# Patient Record
Sex: Female | Born: 1935 | Race: White | Hispanic: No | Smoking: Former smoker
Health system: Southern US, Community
[De-identification: ages and names within clinical notes are randomized; demographics above are authoritative.]

## PROBLEM LIST (undated history)

## (undated) DIAGNOSIS — E1351 Other specified diabetes mellitus with diabetic peripheral angiopathy without gangrene: Secondary | ICD-10-CM

## (undated) DIAGNOSIS — F411 Generalized anxiety disorder: Secondary | ICD-10-CM

## (undated) DIAGNOSIS — I10 Essential (primary) hypertension: Secondary | ICD-10-CM

## (undated) DIAGNOSIS — J189 Pneumonia, unspecified organism: Secondary | ICD-10-CM

## (undated) DIAGNOSIS — C50919 Malignant neoplasm of unspecified site of unspecified female breast: Secondary | ICD-10-CM

## (undated) DIAGNOSIS — N183 Chronic kidney disease, stage 3 (moderate): Secondary | ICD-10-CM

## (undated) DIAGNOSIS — K5730 Diverticulosis of large intestine without perforation or abscess without bleeding: Secondary | ICD-10-CM

## (undated) DIAGNOSIS — H00019 Hordeolum externum unspecified eye, unspecified eyelid: Secondary | ICD-10-CM

## (undated) DIAGNOSIS — J449 Chronic obstructive pulmonary disease, unspecified: Secondary | ICD-10-CM

## (undated) DIAGNOSIS — E039 Hypothyroidism, unspecified: Secondary | ICD-10-CM

## (undated) DIAGNOSIS — I700 Atherosclerosis of aorta: Secondary | ICD-10-CM

## (undated) DIAGNOSIS — F339 Major depressive disorder, recurrent, unspecified: Secondary | ICD-10-CM

## (undated) DIAGNOSIS — M4712 Other spondylosis with myelopathy, cervical region: Secondary | ICD-10-CM

## (undated) DIAGNOSIS — E1365 Other specified diabetes mellitus with hyperglycemia: Secondary | ICD-10-CM

## (undated) DIAGNOSIS — K219 Gastro-esophageal reflux disease without esophagitis: Secondary | ICD-10-CM

## (undated) DIAGNOSIS — I5030 Unspecified diastolic (congestive) heart failure: Secondary | ICD-10-CM

## (undated) HISTORY — DX: Other spondylosis with myelopathy, cervical region: M47.12

## (undated) HISTORY — PX: CHOLECYSTECTOMY: SHX55

## (undated) HISTORY — DX: Generalized anxiety disorder: F41.1

## (undated) HISTORY — DX: Gastro-esophageal reflux disease without esophagitis: K21.9

## (undated) HISTORY — DX: Hordeolum externum unspecified eye, unspecified eyelid: H00.019

## (undated) HISTORY — PX: MASTECTOMY: SHX3

## (undated) HISTORY — DX: Other specified diabetes mellitus with diabetic peripheral angiopathy without gangrene: E13.51

## (undated) HISTORY — DX: Other specified diabetes mellitus with hyperglycemia: E13.65

## (undated) HISTORY — DX: Chronic kidney disease, stage 3 (moderate): N18.3

## (undated) HISTORY — PX: BREAST SURGERY: SHX581

## (undated) HISTORY — DX: Major depressive disorder, recurrent, unspecified: F33.9

---

## 2014-01-18 ENCOUNTER — Encounter (HOSPITAL_COMMUNITY): Payer: Self-pay | Admitting: Emergency Medicine

## 2014-01-18 ENCOUNTER — Inpatient Hospital Stay (HOSPITAL_COMMUNITY)
Admission: EM | Admit: 2014-01-18 | Discharge: 2014-01-27 | DRG: 189 | Disposition: A | Discharge status: Home Health | Payer: Medicare Other | Attending: Family Medicine | Admitting: Family Medicine

## 2014-01-18 ENCOUNTER — Emergency Department (HOSPITAL_COMMUNITY): Payer: Medicare Other

## 2014-01-18 DIAGNOSIS — K9189 Other postprocedural complications and disorders of digestive system: Secondary | ICD-10-CM

## 2014-01-18 DIAGNOSIS — I509 Heart failure, unspecified: Secondary | ICD-10-CM | POA: Diagnosis present

## 2014-01-18 DIAGNOSIS — E875 Hyperkalemia: Secondary | ICD-10-CM | POA: Diagnosis not present

## 2014-01-18 DIAGNOSIS — J441 Chronic obstructive pulmonary disease with (acute) exacerbation: Secondary | ICD-10-CM

## 2014-01-18 DIAGNOSIS — K567 Ileus, unspecified: Secondary | ICD-10-CM

## 2014-01-18 DIAGNOSIS — D72829 Elevated white blood cell count, unspecified: Secondary | ICD-10-CM

## 2014-01-18 DIAGNOSIS — Z791 Long term (current) use of non-steroidal anti-inflammatories (NSAID): Secondary | ICD-10-CM

## 2014-01-18 DIAGNOSIS — Z803 Family history of malignant neoplasm of breast: Secondary | ICD-10-CM

## 2014-01-18 DIAGNOSIS — J449 Chronic obstructive pulmonary disease, unspecified: Secondary | ICD-10-CM

## 2014-01-18 DIAGNOSIS — D175 Benign lipomatous neoplasm of intra-abdominal organs: Secondary | ICD-10-CM | POA: Diagnosis present

## 2014-01-18 DIAGNOSIS — I5032 Chronic diastolic (congestive) heart failure: Secondary | ICD-10-CM | POA: Diagnosis present

## 2014-01-18 DIAGNOSIS — Z6841 Body Mass Index (BMI) 40.0 and over, adult: Secondary | ICD-10-CM

## 2014-01-18 DIAGNOSIS — R7309 Other abnormal glucose: Secondary | ICD-10-CM | POA: Diagnosis present

## 2014-01-18 DIAGNOSIS — D649 Anemia, unspecified: Secondary | ICD-10-CM

## 2014-01-18 DIAGNOSIS — Z79899 Other long term (current) drug therapy: Secondary | ICD-10-CM

## 2014-01-18 DIAGNOSIS — F411 Generalized anxiety disorder: Secondary | ICD-10-CM | POA: Diagnosis present

## 2014-01-18 DIAGNOSIS — D509 Iron deficiency anemia, unspecified: Secondary | ICD-10-CM | POA: Diagnosis present

## 2014-01-18 DIAGNOSIS — Z9981 Dependence on supplemental oxygen: Secondary | ICD-10-CM

## 2014-01-18 DIAGNOSIS — Z9010 Acquired absence of unspecified breast and nipple: Secondary | ICD-10-CM

## 2014-01-18 DIAGNOSIS — D62 Acute posthemorrhagic anemia: Secondary | ICD-10-CM | POA: Diagnosis present

## 2014-01-18 DIAGNOSIS — Z853 Personal history of malignant neoplasm of breast: Secondary | ICD-10-CM

## 2014-01-18 DIAGNOSIS — Z87891 Personal history of nicotine dependence: Secondary | ICD-10-CM

## 2014-01-18 DIAGNOSIS — K922 Gastrointestinal hemorrhage, unspecified: Secondary | ICD-10-CM

## 2014-01-18 DIAGNOSIS — K921 Melena: Secondary | ICD-10-CM

## 2014-01-18 DIAGNOSIS — E039 Hypothyroidism, unspecified: Secondary | ICD-10-CM

## 2014-01-18 DIAGNOSIS — K560 Paralytic ileus: Secondary | ICD-10-CM | POA: Diagnosis present

## 2014-01-18 DIAGNOSIS — I10 Essential (primary) hypertension: Secondary | ICD-10-CM

## 2014-01-18 DIAGNOSIS — J9620 Acute and chronic respiratory failure, unspecified whether with hypoxia or hypercapnia: Principal | ICD-10-CM

## 2014-01-18 DIAGNOSIS — Z801 Family history of malignant neoplasm of trachea, bronchus and lung: Secondary | ICD-10-CM

## 2014-01-18 DIAGNOSIS — J9621 Acute and chronic respiratory failure with hypoxia: Secondary | ICD-10-CM | POA: Diagnosis present

## 2014-01-18 HISTORY — DX: Essential (primary) hypertension: I10

## 2014-01-18 HISTORY — DX: Pneumonia, unspecified organism: J18.9

## 2014-01-18 HISTORY — DX: Malignant neoplasm of unspecified site of unspecified female breast: C50.919

## 2014-01-18 HISTORY — DX: Hypothyroidism, unspecified: E03.9

## 2014-01-18 HISTORY — DX: Chronic obstructive pulmonary disease, unspecified: J44.9

## 2014-01-18 LAB — COMPREHENSIVE METABOLIC PANEL
ALK PHOS: 56 U/L (ref 39–117)
ALT: 16 U/L (ref 0–35)
AST: 16 U/L (ref 0–37)
Albumin: 3.5 g/dL (ref 3.5–5.2)
BUN: 25 mg/dL — ABNORMAL HIGH (ref 6–23)
CO2: 26 meq/L (ref 19–32)
Calcium: 9.2 mg/dL (ref 8.4–10.5)
Chloride: 94 mEq/L — ABNORMAL LOW (ref 96–112)
Creatinine, Ser: 0.88 mg/dL (ref 0.50–1.10)
GFR calc Af Amer: 70 mL/min — ABNORMAL LOW (ref >90)
GFR, EST NON AFRICAN AMERICAN: 60 mL/min — AB (ref >90)
Glucose, Bld: 128 mg/dL — ABNORMAL HIGH (ref 70–99)
POTASSIUM: 4.1 meq/L (ref 3.7–5.3)
SODIUM: 134 meq/L — AB (ref 137–147)
Total Bilirubin: 0.3 mg/dL (ref 0.3–1.2)
Total Protein: 6.6 g/dL (ref 6.0–8.3)

## 2014-01-18 LAB — BLOOD GAS, ARTERIAL
ACID-BASE EXCESS: 1.4 mmol/L (ref 0.0–2.0)
Bicarbonate: 25.3 mEq/L — ABNORMAL HIGH (ref 20.0–24.0)
DRAWN BY: 331471
O2 CONTENT: 3 L/min
O2 Saturation: 97.3 %
PCO2 ART: 39.6 mmHg (ref 35.0–45.0)
PH ART: 7.422 (ref 7.350–7.450)
Patient temperature: 98.6
TCO2: 23.9 mmol/L (ref 0–100)
pO2, Arterial: 96.5 mmHg (ref 80.0–100.0)

## 2014-01-18 LAB — CBC WITH DIFFERENTIAL/PLATELET
Basophils Absolute: 0.1 10*3/uL (ref 0.0–0.1)
Basophils Relative: 1 % (ref 0–1)
Eosinophils Absolute: 0.1 10*3/uL (ref 0.0–0.7)
Eosinophils Relative: 1 % (ref 0–5)
HCT: 25 % — ABNORMAL LOW (ref 36.0–46.0)
Hemoglobin: 8.3 g/dL — ABNORMAL LOW (ref 12.0–15.0)
LYMPHS ABS: 1.8 10*3/uL (ref 0.7–4.0)
LYMPHS PCT: 15 % (ref 12–46)
MCH: 31.1 pg (ref 26.0–34.0)
MCHC: 33.2 g/dL (ref 30.0–36.0)
MCV: 93.6 fL (ref 78.0–100.0)
Monocytes Absolute: 0.6 10*3/uL (ref 0.1–1.0)
Monocytes Relative: 5 % (ref 3–12)
NEUTROS ABS: 9.3 10*3/uL — AB (ref 1.7–7.7)
NEUTROS PCT: 78 % — AB (ref 43–77)
PLATELETS: 379 10*3/uL (ref 150–400)
RBC: 2.67 MIL/uL — AB (ref 3.87–5.11)
RDW: 15.4 % (ref 11.5–15.5)
WBC: 11.9 10*3/uL — AB (ref 4.0–10.5)

## 2014-01-18 LAB — CBC
HEMATOCRIT: 29.9 % — AB (ref 36.0–46.0)
Hemoglobin: 9.1 g/dL — ABNORMAL LOW (ref 12.0–15.0)
MCH: 28.6 pg (ref 26.0–34.0)
MCHC: 30.4 g/dL (ref 30.0–36.0)
MCV: 94 fL (ref 78.0–100.0)
PLATELETS: 308 10*3/uL (ref 150–400)
RBC: 3.18 MIL/uL — ABNORMAL LOW (ref 3.87–5.11)
RDW: 15.6 % — AB (ref 11.5–15.5)
WBC: 11.9 10*3/uL — AB (ref 4.0–10.5)

## 2014-01-18 LAB — PROTIME-INR
INR: 0.98 (ref 0.00–1.49)
Prothrombin Time: 12.8 seconds (ref 11.6–15.2)

## 2014-01-18 LAB — I-STAT CG4 LACTIC ACID, ED: Lactic Acid, Venous: 1.66 mmol/L (ref 0.5–2.2)

## 2014-01-18 LAB — URINALYSIS, ROUTINE W REFLEX MICROSCOPIC
BILIRUBIN URINE: NEGATIVE
Glucose, UA: NEGATIVE mg/dL
HGB URINE DIPSTICK: NEGATIVE
Ketones, ur: NEGATIVE mg/dL
Nitrite: NEGATIVE
PH: 6 (ref 5.0–8.0)
Protein, ur: NEGATIVE mg/dL
SPECIFIC GRAVITY, URINE: 1.012 (ref 1.005–1.030)
Urobilinogen, UA: 0.2 mg/dL (ref 0.0–1.0)

## 2014-01-18 LAB — MAGNESIUM: Magnesium: 2.2 mg/dL (ref 1.5–2.5)

## 2014-01-18 LAB — TROPONIN I
Troponin I: <0.3 ng/mL (ref <0.30)
Troponin I: <0.3 ng/mL (ref <0.30)

## 2014-01-18 LAB — URINE MICROSCOPIC-ADD ON

## 2014-01-18 LAB — LIPASE, BLOOD: LIPASE: 32 U/L (ref 11–59)

## 2014-01-18 LAB — PRO B NATRIURETIC PEPTIDE: Pro B Natriuretic peptide (BNP): 484.2 pg/mL — ABNORMAL HIGH (ref 0–450)

## 2014-01-18 LAB — MRSA PCR SCREENING: MRSA BY PCR: NEGATIVE

## 2014-01-18 LAB — ABO/RH: ABO/RH(D): O POS

## 2014-01-18 MED ORDER — PANTOPRAZOLE SODIUM 40 MG IV SOLR: 40.0000 mg | Freq: Two times a day (BID) | INTRAVENOUS | Status: DC

## 2014-01-18 MED ORDER — MOMETASONE FURO-FORMOTEROL FUM 100-5 MCG/ACT IN AERO
2.0000 | INHALATION_SPRAY | Freq: Two times a day (BID) | RESPIRATORY_TRACT | Status: DC
  Administered 2014-01-18 – 2014-01-19 (×2): 2 via RESPIRATORY_TRACT
  Filled 2014-01-18: qty 8.8

## 2014-01-18 MED ORDER — ONDANSETRON HCL 4 MG/2ML IJ SOLN
4.0000 mg | Freq: Four times a day (QID) | INTRAMUSCULAR | Status: DC | PRN
  Administered 2014-01-20: 4 mg via INTRAVENOUS
  Filled 2014-01-18: qty 2

## 2014-01-18 MED ORDER — POLYETHYLENE GLYCOL 3350 17 G PO PACK
17.0000 g | PACK | Freq: Every day | ORAL | Status: DC | PRN
  Filled 2014-01-18: qty 1

## 2014-01-18 MED ORDER — BISACODYL 10 MG RE SUPP: 10.0000 mg | Freq: Every day | RECTAL | Status: DC | PRN

## 2014-01-18 MED ORDER — ONDANSETRON HCL 4 MG PO TABS: 4.0000 mg | ORAL_TABLET | Freq: Four times a day (QID) | ORAL | Status: DC | PRN

## 2014-01-18 MED ORDER — LEVOFLOXACIN IN D5W 500 MG/100ML IV SOLN
500.0000 mg | INTRAVENOUS | Status: DC
  Administered 2014-01-19 – 2014-01-22 (×4): 500 mg via INTRAVENOUS
  Filled 2014-01-18 (×4): qty 100

## 2014-01-18 MED ORDER — ALBUTEROL SULFATE (2.5 MG/3ML) 0.083% IN NEBU: 2.5000 mg | INHALATION_SOLUTION | RESPIRATORY_TRACT | Status: DC | PRN

## 2014-01-18 MED ORDER — SODIUM CHLORIDE 0.9 % IV SOLN
8.0000 mg/h | INTRAVENOUS | Status: DC
  Administered 2014-01-18: 8 mg/h via INTRAVENOUS
  Filled 2014-01-18 (×4): qty 80

## 2014-01-18 MED ORDER — IPRATROPIUM BROMIDE 0.02 % IN SOLN
0.5000 mg | RESPIRATORY_TRACT | Status: DC
Start: 2014-01-18 — End: 2014-01-18

## 2014-01-18 MED ORDER — LEVOFLOXACIN IN D5W 750 MG/150ML IV SOLN
750.0000 mg | Freq: Once | INTRAVENOUS | Status: AC
  Administered 2014-01-18: 750 mg via INTRAVENOUS
  Filled 2014-01-18: qty 150

## 2014-01-18 MED ORDER — LEVOTHYROXINE SODIUM 75 MCG PO TABS
75.0000 ug | ORAL_TABLET | Freq: Every day | ORAL | Status: DC
  Administered 2014-01-19 – 2014-01-27 (×9): 75 ug via ORAL
  Filled 2014-01-18 (×11): qty 1

## 2014-01-18 MED ORDER — ACETAMINOPHEN 325 MG PO TABS
650.0000 mg | ORAL_TABLET | Freq: Four times a day (QID) | ORAL | Status: DC | PRN
  Administered 2014-01-22: 650 mg via ORAL
  Filled 2014-01-18: qty 2

## 2014-01-18 MED ORDER — OSELTAMIVIR PHOSPHATE 75 MG PO CAPS
75.0000 mg | ORAL_CAPSULE | Freq: Two times a day (BID) | ORAL | Status: DC
  Administered 2014-01-18 – 2014-01-19 (×2): 75 mg via ORAL
  Filled 2014-01-18 (×5): qty 1

## 2014-01-18 MED ORDER — DM-GUAIFENESIN ER 30-600 MG PO TB12: 1.0000 | ORAL_TABLET | Freq: Two times a day (BID) | ORAL | Status: DC

## 2014-01-18 MED ORDER — SODIUM CHLORIDE 0.9 % IV SOLN
INTRAVENOUS | Status: DC
  Administered 2014-01-18: 21:00:00 via INTRAVENOUS

## 2014-01-18 MED ORDER — IPRATROPIUM-ALBUTEROL 0.5-2.5 (3) MG/3ML IN SOLN
3.0000 mL | RESPIRATORY_TRACT | Status: DC
  Administered 2014-01-18 – 2014-01-20 (×12): 3 mL via RESPIRATORY_TRACT
  Filled 2014-01-18 (×13): qty 3

## 2014-01-18 MED ORDER — SODIUM CHLORIDE 0.9 % IV SOLN
80.0000 mg | Freq: Once | INTRAVENOUS | Status: DC
  Filled 2014-01-18: qty 80

## 2014-01-18 MED ORDER — ACETAMINOPHEN 650 MG RE SUPP: 650.0000 mg | Freq: Four times a day (QID) | RECTAL | Status: DC | PRN

## 2014-01-18 MED ORDER — ALBUTEROL SULFATE (2.5 MG/3ML) 0.083% IN NEBU
5.0000 mg | INHALATION_SOLUTION | Freq: Once | RESPIRATORY_TRACT | Status: AC
  Administered 2014-01-18: 5 mg via RESPIRATORY_TRACT
  Filled 2014-01-18 (×2): qty 6

## 2014-01-18 MED ORDER — SODIUM CHLORIDE 0.9 % IJ SOLN
3.0000 mL | Freq: Two times a day (BID) | INTRAMUSCULAR | Status: DC
  Administered 2014-01-19 – 2014-01-27 (×10): 3 mL via INTRAVENOUS

## 2014-01-18 MED ORDER — SODIUM CHLORIDE 0.9 % IV BOLUS (SEPSIS): 1000.0000 mL | Freq: Once | INTRAVENOUS | Status: DC

## 2014-01-18 MED ORDER — IPRATROPIUM BROMIDE 0.02 % IN SOLN
0.5000 mg | Freq: Once | RESPIRATORY_TRACT | Status: AC
  Administered 2014-01-18: 0.5 mg via RESPIRATORY_TRACT
  Filled 2014-01-18: qty 2.5

## 2014-01-18 MED ORDER — METHYLPREDNISOLONE SODIUM SUCC 125 MG IJ SOLR: 80.0000 mg | Freq: Once | INTRAMUSCULAR | Status: DC

## 2014-01-18 MED ORDER — IPRATROPIUM BROMIDE 0.02 % IN SOLN: 0.5000 mg | RESPIRATORY_TRACT | Status: DC | PRN

## 2014-01-18 MED ORDER — DM-GUAIFENESIN ER 30-600 MG PO TB12
2.0000 | ORAL_TABLET | Freq: Two times a day (BID) | ORAL | Status: DC
  Administered 2014-01-18 – 2014-01-27 (×17): 2 via ORAL
  Filled 2014-01-18 (×20): qty 2

## 2014-01-18 MED ORDER — FLEET ENEMA 7-19 GM/118ML RE ENEM: 1.0000 | ENEMA | Freq: Once | RECTAL | Status: AC | PRN

## 2014-01-18 MED ORDER — METHYLPREDNISOLONE SODIUM SUCC 125 MG IJ SOLR
80.0000 mg | Freq: Three times a day (TID) | INTRAMUSCULAR | Status: DC
  Administered 2014-01-18 – 2014-01-22 (×11): 80 mg via INTRAVENOUS
  Filled 2014-01-18 (×5): qty 1.28
  Filled 2014-01-18: qty 2
  Filled 2014-01-18 (×10): qty 1.28
  Filled 2014-01-18: qty 2
  Filled 2014-01-18: qty 1.28

## 2014-01-18 MED ORDER — OXYCODONE HCL 5 MG PO TABS
5.0000 mg | ORAL_TABLET | ORAL | Status: DC | PRN
  Administered 2014-01-19 – 2014-01-21 (×3): 5 mg via ORAL
  Filled 2014-01-18 (×3): qty 1

## 2014-01-18 MED ORDER — ALBUTEROL SULFATE (2.5 MG/3ML) 0.083% IN NEBU
2.5000 mg | INHALATION_SOLUTION | RESPIRATORY_TRACT | Status: DC
Start: 2014-01-18 — End: 2014-01-18

## 2014-01-18 NOTE — ED Notes (Signed)
Respiratory at bedside.

## 2014-01-18 NOTE — ED Notes (Signed)
Pt returned from XRAY 

## 2014-01-18 NOTE — ED Provider Notes (Addendum)
CSN: [id number]     Arrival date & time 01/18/14  1455 History   First MD Initiated Contact with Patient 01/18/14 1506     Chief Complaint  Patient presents with  . Shortness of Breath     (Consider location/radiation/quality/duration/timing/severity/associated sxs/prior Treatment) HPI Comments: Patient presents to the ER for evaluation of shortness of breath. Patient reports that she has been ill since Christmas. She reports cough, congestion and cold symptoms. In the last couple of days, however, she has progressively worsened. She has had increased cough and significant shortness of breath.  Patient reports a history of COPD. She usually only needs to use one albuterol treatment today. She does have home oxygen, but only uses it occasionally. She has had increased all of this in the last few days.  Patient also reports that she has been experiencing abdominal distention and discomfort. It has been ongoing for approximately 5 days. It started with nausea, vomiting and diarrhea. She then noticed that her stools became black. This has been clearing up in the last couple of days.  Patient is a 80 y.o. female presenting with shortness of breath.  Shortness of Breath Associated symptoms: abdominal pain, cough and wheezing     Past Medical History  Diagnosis Date  . COPD (chronic obstructive pulmonary disease)   . Breast CA   . Hypertension    Past Surgical History  Procedure Laterality Date  . Cholecystectomy     History reviewed. No pertinent family history. History  Substance Use Topics  . Smoking status: Former Research scientist (life sciences)  . Smokeless tobacco: Not on file  . Alcohol Use: No   OB History   Grav Para Term Preterm Abortions TAB SAB Ect Mult Living                 Review of Systems  Respiratory: Positive for cough, shortness of breath and wheezing.   Gastrointestinal: Positive for abdominal pain and abdominal distention.  All other systems reviewed and are  negative.      Allergies  Review of patient's allergies indicates no known allergies.  Home Medications   Current Outpatient Rx  Name  Route  Sig  Dispense  Refill  . albuterol (PROVENTIL HFA;VENTOLIN HFA) 108 (90 BASE) MCG/ACT inhaler   Inhalation   Inhale 1 puff into the lungs every 6 (six) hours as needed for wheezing or shortness of breath.         Marland Kitchen albuterol (PROVENTIL) (2.5 MG/3ML) 0.083% nebulizer solution   Nebulization   Take 2.5 mg by nebulization every 6 (six) hours as needed for wheezing or shortness of breath.         . cholecalciferol (VITAMIN D) 1000 UNITS tablet   Oral   Take 1,000 Units by mouth daily.         . Fluticasone-Salmeterol (ADVAIR) 250-50 MCG/DOSE AEPB   Inhalation   Inhale 1 puff into the lungs 2 (two) times daily.         . hydrochlorothiazide (HYDRODIURIL) 25 MG tablet   Oral   Take 25 mg by mouth daily.         Marland Kitchen levothyroxine (SYNTHROID, LEVOTHROID) 75 MCG tablet   Oral   Take 75 mcg by mouth daily before breakfast.         . lisinopril (PRINIVIL,ZESTRIL) 10 MG tablet   Oral   Take 10 mg by mouth daily.         Marland Kitchen tiotropium (SPIRIVA) 18 MCG inhalation capsule   Inhalation  Place 18 mcg into inhaler and inhale daily.          BP 121/50  Pulse 75  Temp(Src) 97.9 F (36.6 C) (Oral)  Resp 23  SpO2 100% Physical Exam  Constitutional: She is oriented to person, place, and time. She appears well-developed and well-nourished. She appears distressed.  HENT:  Head: Normocephalic and atraumatic.  Right Ear: Hearing normal.  Left Ear: Hearing normal.  Nose: Nose normal.  Mouth/Throat: Oropharynx is clear and moist and mucous membranes are normal.  Eyes: Conjunctivae and EOM are normal. Pupils are equal, round, and reactive to light.  Neck: Normal range of motion. Neck supple.  Cardiovascular: Regular rhythm, S1 normal and S2 normal.  Exam reveals no gallop and no friction rub.   No murmur heard. Pulmonary/Chest:  Accessory muscle usage present. Tachypnea noted. She is in respiratory distress. She has decreased breath sounds. She has wheezes. She has no rhonchi. She has no rales. She exhibits no tenderness.  Abdominal: Soft. Normal appearance. She exhibits distension. Bowel sounds are decreased. There is no hepatosplenomegaly. There is no tenderness. There is no rebound, no guarding, no tenderness at McBurney's point and negative Murphy's sign. No hernia.  Musculoskeletal: Normal range of motion. She exhibits edema.  Neurological: She is alert and oriented to person, place, and time. She has normal strength. No cranial nerve deficit or sensory deficit. Coordination normal. GCS eye subscore is 4. GCS verbal subscore is 5. GCS motor subscore is 6.  Skin: Skin is warm, dry and intact. No rash noted. No cyanosis.  Psychiatric: She has a normal mood and affect. Her speech is normal and behavior is normal. Thought content normal.    ED Course  Procedures (including critical care time) Labs Review Labs Reviewed  CBC WITH DIFFERENTIAL - Abnormal; Notable for the following:    WBC 11.9 (*)    RBC 2.67 (*)    Hemoglobin 8.3 (*)    HCT 25.0 (*)    Neutrophils Relative % 78 (*)    Neutro Abs 9.3 (*)    All other components within normal limits  COMPREHENSIVE METABOLIC PANEL - Abnormal; Notable for the following:    Sodium 134 (*)    Chloride 94 (*)    Glucose, Bld 128 (*)    BUN 25 (*)    GFR calc non Af Amer 60 (*)    GFR calc Af Amer 70 (*)    All other components within normal limits  PRO B NATRIURETIC PEPTIDE - Abnormal; Notable for the following:    Pro B Natriuretic peptide (BNP) 484.2 (*)    All other components within normal limits  TROPONIN I  PROTIME-INR  LIPASE, BLOOD  URINALYSIS, ROUTINE W REFLEX MICROSCOPIC  I-STAT CG4 LACTIC ACID, ED  POC OCCULT BLOOD, ED  TYPE AND SCREEN  ABO/RH   Imaging Review Dg Abd Acute W/chest  01/18/2014   CLINICAL DATA:  Cough, shortness of breath,  abdominal pain. Abdominal distention.  EXAM: ACUTE ABDOMEN SERIES (ABDOMEN 2 VIEW & CHEST 1 VIEW)  COMPARISON:  None.  FINDINGS: Mild hyperinflation of the lungs. Heart is borderline in size. No confluent airspace opacities or effusions.  Nonobstructive bowel gas pattern. Prior cholecystectomy. Moderate stool in the colon. No free air, organomegaly or suspicious calcification.  IMPRESSION: COPD.  No active cardiopulmonary disease.  No evidence of bowel obstruction or free air. Moderate stool burden in the colon.   Electronically Signed   By: Rolm Baptise M.D.   On: 01/18/2014 16:15  EKG Interpretation   Date/Time:  Saturday January 18 2014 15:33:12 EDT Ventricular Rate:  82 PR Interval:  72 QRS Duration: 95 QT Interval:  323 QTC Calculation: 377 R Axis:   76 Text Interpretation:  Sinus rhythm Abnormal R-wave progression, early  transition Non-specific ST-t changes No previous tracing Confirmed by  Zakiyah Diop  MD, Chia Mowers ([id number]) on 01/18/2014 3:40:39 PM      MDM   Final diagnoses:  COPD (chronic obstructive pulmonary disease)  GI bleed    Patient presents to the ER with complaints of shortness of breath. She reports that she has been sick since Christmas, but in the last couple of days has had increased shortness of breath, cough, congestion. She does have a history of COPD. She uses home oxygen intermittently, when needed. She uses home nebulizers, usually only once a day. She has had increased both for the last few days because of her shortness of breath. She is not experiencing any chest pain. Cardiac workup was negative including no clinical signs of congestive heart failure. She did have bronchospasm on arrival. She was tachypneic and dyspneic. She was also hypoxic with room air oxygen saturation of 86-87%. This improved with nasal cannula oxygen. She was given albuterol and Atrovent with only minimal improvement of her breathing.  Additionally, patient reports that she has had  intermittent black stool this week. Rectal exam did reveal heme-positive stool. Hemoglobin is 8.3. Patient only recently moved here from [place], has no previous values. She does not have any hypotension associated with the anemia.  Patient's COPD exacerbation will require hospitalization. She was given Levaquin empirically, no evidence of pneumonia on x-ray. She withheld at this point, as there is concern for upper GI bleed.  Addendum: Discussed with Doctor Grandville Silos, hospitalist. Will add influenza PCR, ABG. Additionally he has requested a single dose of Solu-Medrol 80 mg IV.  Orpah Greek, MD 01/18/14 [place], MD 01/18/14 [phone]

## 2014-01-18 NOTE — ED Notes (Signed)
Pt states she has had sob since Christmas since she had cold. Pt speaking in short sentences. Daughter told registration that pt becomes more anxious when she heard other family member was coming to the emergency room.

## 2014-01-18 NOTE — ED Notes (Signed)
She tells me she is feeling better.  She states she is worried about her daughter, who is being seen here in our dep't. And is awaiting the arrival of her granddaughter.  She is pleased in the improvement of her breathing.

## 2014-01-18 NOTE — H&P (Signed)
[facility] History and Physical  Dawn Montoya QMG:[id number] DOB: 1932/10/14 DOA: 01/18/2014  Referring physician: Dr. Betsey Holiday PCP: No primary provider on file.   Chief Complaint: Shortness of breath  HPI: Dawn Montoya is a 80 y.o. female  With history of COPD on home O2 occasionally, hypertension, hypothyroidism, history of breast cancer status post left mastectomy who presents to the ED with a 3 to four-day history of worsening shortness of breath, wheezing, dizziness, nausea and episode of emesis and a 4 to five-day history of black tarry stools. Patient states has had upper rest for his symptoms consistent with a cold runny nose congestion productive cough of yellowish mucus since December. Patient denies any fevers, no chest pain, no constipation, some black loose stools which have improved, no dysuria. Patient does endorse some generalized weakness to the point where she is unable to ambulate. Patient also endorses some upper abdominal discomfort after she eats. Patient was seen in the ED and per ED physician patient had increased work of breathing and was wheezing on presentation to the emergency room. Patient was given some nebulizer treatments with clinical improvement however patient still with some work of breathing. Chest x-ray which was obtained was negative for any acute infiltrates. Comprehensive metabolic profile had a sodium of 134 chloride of 94 BUN of 25 otherwise was within normal limits. First set of troponin was negative. Pro BNP was 484.2. Lactic acid level is 1.66. CBC obtained had a white count of 11.9 hemoglobin of 8.3 otherwise was within normal limits. INR was 0.98. Acute abdominal series was negative for any acute abnormalities except moderate stool burden. EKG showed normal sinus rhythm with poor R-wave progression. We were called to admit the patient for further evaluation and management.   Review of Systems: As per history of present illness otherwise  negative. Constitutional:  No weight loss, night sweats, Fevers, chills, fatigue.  HEENT:  No headaches, Difficulty swallowing,Tooth/dental problems,Sore throat,  No sneezing, itching, ear ache, nasal congestion, post nasal drip,  Cardio-vascular:  No chest pain, Orthopnea, PND, swelling in lower extremities, anasarca, dizziness, palpitations  GI:  No heartburn, indigestion, abdominal pain, nausea, vomiting, diarrhea, change in bowel habits, loss of appetite  Resp:  No shortness of breath with exertion or at rest. No excess mucus, no productive cough, No non-productive cough, No coughing up of blood.No change in color of mucus.No wheezing.No chest wall deformity  Skin:  no rash or lesions.  GU:  no dysuria, change in color of urine, no urgency or frequency. No flank pain.  Musculoskeletal:  No joint pain or swelling. No decreased range of motion. No back pain.  Psych:  No change in mood or affect. No depression or anxiety. No memory loss.   Past Medical History  Diagnosis Date  . COPD (chronic obstructive pulmonary disease)   . Breast CA   . Hypertension   . HTN (hypertension) 01/18/2014  . Hypothyroidism 01/18/2014   Past Surgical History  Procedure Laterality Date  . Cholecystectomy    . Breast surgery    . Mastectomy Left    Social History:  reports that she has quit smoking. She does not have any smokeless tobacco history on file. She reports that she does not drink alcohol or use illicit drugs.  No Known Allergies  History reviewed. No pertinent family history.   Prior to Admission medications   Medication Sig Start Date End Date Taking? Authorizing Provider  albuterol (PROVENTIL HFA;VENTOLIN HFA) 108 (90 BASE) MCG/ACT inhaler Inhale 1 puff into  the lungs every 6 (six) hours as needed for wheezing or shortness of breath.   Yes Historical Provider, MD  albuterol (PROVENTIL) (2.5 MG/3ML) 0.083% nebulizer solution Take 2.5 mg by nebulization every 6 (six) hours as needed  for wheezing or shortness of breath.   Yes Historical Provider, MD  cholecalciferol (VITAMIN D) 1000 UNITS tablet Take 1,000 Units by mouth daily.   Yes Historical Provider, MD  Fluticasone-Salmeterol (ADVAIR) 250-50 MCG/DOSE AEPB Inhale 1 puff into the lungs 2 (two) times daily.   Yes Historical Provider, MD  hydrochlorothiazide (HYDRODIURIL) 25 MG tablet Take 25 mg by mouth daily.   Yes Historical Provider, MD  levothyroxine (SYNTHROID, LEVOTHROID) 75 MCG tablet Take 75 mcg by mouth daily before breakfast.   Yes Historical Provider, MD  lisinopril (PRINIVIL,ZESTRIL) 10 MG tablet Take 10 mg by mouth daily.   Yes Historical Provider, MD  tiotropium (SPIRIVA) 18 MCG inhalation capsule Place 18 mcg into inhaler and inhale daily.   Yes Historical Provider, MD   Physical Exam: Filed Vitals:   01/18/14 1920  BP: 130/58  Pulse: 79  Temp:   Resp: 19    BP 130/58  Pulse 79  Temp(Src) 97.9 F (36.6 C) (Oral)  Resp 19  SpO2 100%  General:  Elderly obese female laying on a gurney with use of some accessory muscles of respiration however speaking in full sentences. Eyes: PERRLA, EOMI, normal lids, irises & conjunctiva ENT: grossly normal hearing, lips & tongue Neck: no LAD, masses or thyromegaly Cardiovascular: RRR, no m/r/g. No LE edema. Respiratory: Poor air movement, some expiratory wheezing, no rhonchi, no crackles. Abdomen: soft, ntnd, positive bowel sounds, no rebound, no guarding. Skin: no rash or induration seen on limited exam Musculoskeletal: grossly normal tone BUE/BLE Psychiatric: grossly normal mood and affect, speech fluent and appropriate Neurologic: Alert and oriented x3. Cranial nerves II through XII are grossly intact. No focal deficits.           Labs on Admission:  Basic Metabolic Panel:  Recent Labs Lab 01/18/14 1532  NA 134*  K 4.1  CL 94*  CO2 26  GLUCOSE 128*  BUN 25*  CREATININE 0.88  CALCIUM 9.2   Liver Function Tests:  Recent Labs Lab  01/18/14 1532  AST 16  ALT 16  ALKPHOS 56  BILITOT 0.3  PROT 6.6  ALBUMIN 3.5    Recent Labs Lab 01/18/14 1532  LIPASE 32   No results found for this basename: AMMONIA,  in the last 168 hours CBC:  Recent Labs Lab 01/18/14 1532  WBC 11.9*  NEUTROABS 9.3*  HGB 8.3*  HCT 25.0*  MCV 93.6  PLT 379   Cardiac Enzymes:  Recent Labs Lab 01/18/14 1532  TROPONINI <0.30    BNP (last 3 results)  Recent Labs  01/18/14 1532  PROBNP 484.2*   CBG: No results found for this basename: GLUCAP,  in the last 168 hours  Radiological Exams on Admission: Dg Abd Acute W/chest  01/18/2014   CLINICAL DATA:  Cough, shortness of breath, abdominal pain. Abdominal distention.  EXAM: ACUTE ABDOMEN SERIES (ABDOMEN 2 VIEW & CHEST 1 VIEW)  COMPARISON:  None.  FINDINGS: Mild hyperinflation of the lungs. Heart is borderline in size. No confluent airspace opacities or effusions.  Nonobstructive bowel gas pattern. Prior cholecystectomy. Moderate stool in the colon. No free air, organomegaly or suspicious calcification.  IMPRESSION: COPD.  No active cardiopulmonary disease.  No evidence of bowel obstruction or free air. Moderate stool burden in the colon.  Electronically Signed   By: Rolm Baptise M.D.   On: 01/18/2014 16:15    EKG: Independently reviewed. Normal sinus rhythm with poor R-wave progression. First degree AV block.  Assessment/Plan Principal Problem:   Acute on chronic respiratory failure Active Problems:   COPD (chronic obstructive pulmonary disease)   COPD with acute exacerbation   Anemia   Melena   GIB (gastrointestinal bleeding)   Leukocytosis   HTN (hypertension)   Hypothyroidism  #1 acute on chronic respiratory failure Questionable etiology. Likely secondary to an acute COPD exacerbation as patient her commode upper rest for his symptoms with a cough and wheezing and increased work of breathing which improved with nebulizer treatments. Also in the differential could be  the flu versus cardiac etiology. Pro BNP was 484.2 however no significant volume load noted on examination. Chest x-ray is negative for any acute infiltrate. ABG was obtained after nebulizer treatments which was within normal limits. Will admit the patient to the step down unit. Will check a sputum Gram stain and culture. Check her influenza PCR. Cycle cardiac enzymes every 6 hours x3. Repeat EKG. Strict is and os. Daily weights. Repeat pro BNP in the morning. Check a 2-D echo. Will place patient empirically on IV Levaquin, oxygen, scheduled nebulizer treatments, IV Solu-Medrol. BiPAP as needed if respiratory status worsens. Placed empirically on Tamiflu. Follow.  #2 probable acute COPD exacerbation Patient had presented with upper respiratory symptoms worsening cough shortness of breath increased work of breathing and wheezing which improves him on nebulizer treatments. ABG was obtained after nebulizer treatments were given the patient on nasal cannula an ABG was within normal limits. Placed empirically on IV Levaquin, oxygen, scheduled nebulizer treatments, IV Solu-Medrol. BiPAP as needed if respiratory status worsens. Also placed empirically on Tamiflu. Follow.  #3 melena/probable upper GI bleed Patient with complaints of black tarry stools. Patient noted also to abuse him ibuprofen a few days prior to admission. Patient's hemoglobin currently at 8.3. Will place on clear liquids and n.p.o. after midnight. Check a CBC now and every 8 hours. Place on a Protonix drip. Type and cross. Transfusion threshold hemoglobin less than 7. Gentle hydration. I have consulted gastroenterology, Dr. Watt Climes who will assess patient in the morning. Follow.  #4 leukocytosis Likely reactive leukocytosis. Chest x-ray is negative. Check a UA with cultures and sensitivities. Follow.  #5 hypothyroidism Check a TSH. Continue home dose Synthroid.  #6 hypertension Hold antihypertensive medications.  #7 anemia Likely secondary  to problem #3. Check an anemia panel. Follow H&H.  #8 prophylaxis PPI for GI prophylaxis. SCDs for DVT prophylaxis.    Code Status: Full Family Communication: Updated patient and granddaughter at bedside. Disposition Plan: Admit to the step down unit.  Time spent: [facility] Pager [phone]

## 2014-01-18 NOTE — ED Notes (Signed)
Pt in XRAY 

## 2014-01-18 NOTE — ED Notes (Signed)
Pt aware of the need for a urine sample. 

## 2014-01-19 ENCOUNTER — Encounter (HOSPITAL_COMMUNITY): Payer: Self-pay | Admitting: *Deleted

## 2014-01-19 ENCOUNTER — Encounter (HOSPITAL_COMMUNITY)
Admission: EM | Disposition: A | Discharge status: Home Health | Payer: Self-pay | Source: Home / Self Care | Attending: Family Medicine

## 2014-01-19 DIAGNOSIS — I10 Essential (primary) hypertension: Secondary | ICD-10-CM

## 2014-01-19 DIAGNOSIS — E039 Hypothyroidism, unspecified: Secondary | ICD-10-CM

## 2014-01-19 DIAGNOSIS — J449 Chronic obstructive pulmonary disease, unspecified: Secondary | ICD-10-CM

## 2014-01-19 DIAGNOSIS — D72829 Elevated white blood cell count, unspecified: Secondary | ICD-10-CM

## 2014-01-19 DIAGNOSIS — I369 Nonrheumatic tricuspid valve disorder, unspecified: Secondary | ICD-10-CM

## 2014-01-19 HISTORY — PX: ESOPHAGOGASTRODUODENOSCOPY: SHX5428

## 2014-01-19 LAB — TROPONIN I: Troponin I: <0.3 ng/mL (ref <0.30)

## 2014-01-19 LAB — CBC
HCT: 24 % — ABNORMAL LOW (ref 36.0–46.0)
HEMATOCRIT: 24.5 % — AB (ref 36.0–46.0)
HEMOGLOBIN: 7.9 g/dL — AB (ref 12.0–15.0)
Hemoglobin: 8 g/dL — ABNORMAL LOW (ref 12.0–15.0)
MCH: 30.9 pg (ref 26.0–34.0)
MCH: 30.8 pg (ref 26.0–34.0)
MCHC: 32.9 g/dL (ref 30.0–36.0)
MCHC: 32.7 g/dL (ref 30.0–36.0)
MCV: 93.8 fL (ref 78.0–100.0)
MCV: 94.2 fL (ref 78.0–100.0)
PLATELETS: 313 10*3/uL (ref 150–400)
Platelets: 329 10*3/uL (ref 150–400)
RBC: 2.56 MIL/uL — ABNORMAL LOW (ref 3.87–5.11)
RBC: 2.6 MIL/uL — ABNORMAL LOW (ref 3.87–5.11)
RDW: 15.3 % (ref 11.5–15.5)
RDW: 15.4 % (ref 11.5–15.5)
WBC: 10.5 10*3/uL (ref 4.0–10.5)
WBC: 9.4 10*3/uL (ref 4.0–10.5)

## 2014-01-19 LAB — COMPREHENSIVE METABOLIC PANEL
ALT: 14 U/L (ref 0–35)
AST: 15 U/L (ref 0–37)
Albumin: 3.3 g/dL — ABNORMAL LOW (ref 3.5–5.2)
Alkaline Phosphatase: 54 U/L (ref 39–117)
BILIRUBIN TOTAL: 0.4 mg/dL (ref 0.3–1.2)
BUN: 19 mg/dL (ref 6–23)
CALCIUM: 8.7 mg/dL (ref 8.4–10.5)
CO2: 25 meq/L (ref 19–32)
CREATININE: 0.85 mg/dL (ref 0.50–1.10)
Chloride: 103 mEq/L (ref 96–112)
GFR calc non Af Amer: 63 mL/min — ABNORMAL LOW (ref >90)
GFR, EST AFRICAN AMERICAN: 73 mL/min — AB (ref >90)
Glucose, Bld: 187 mg/dL — ABNORMAL HIGH (ref 70–99)
Potassium: 4.9 mEq/L (ref 3.7–5.3)
Sodium: 137 mEq/L (ref 137–147)
Total Protein: 6.1 g/dL (ref 6.0–8.3)

## 2014-01-19 LAB — TSH: TSH: 3.197 u[IU]/mL (ref 0.350–4.500)

## 2014-01-19 LAB — INFLUENZA PANEL BY PCR (TYPE A & B)
H1N1 flu by pcr: NOT DETECTED
INFLBPCR: NEGATIVE
Influenza A By PCR: NEGATIVE

## 2014-01-19 LAB — PROTIME-INR
INR: 1.06 (ref 0.00–1.49)
Prothrombin Time: 13.6 seconds (ref 11.6–15.2)

## 2014-01-19 LAB — FOLATE

## 2014-01-19 LAB — IRON AND TIBC
Iron: 36 ug/dL — ABNORMAL LOW (ref 42–135)
Saturation Ratios: 12 % — ABNORMAL LOW (ref 20–55)
TIBC: 308 ug/dL (ref 250–470)
UIBC: 272 ug/dL (ref 125–400)

## 2014-01-19 LAB — FERRITIN: Ferritin: 68 ng/mL (ref 10–291)

## 2014-01-19 LAB — VITAMIN B12: Vitamin B-12: 415 pg/mL (ref 211–911)

## 2014-01-19 LAB — GLUCOSE, CAPILLARY: Glucose-Capillary: 178 mg/dL — ABNORMAL HIGH (ref 70–99)

## 2014-01-19 LAB — PRO B NATRIURETIC PEPTIDE: Pro B Natriuretic peptide (BNP): 423.1 pg/mL (ref 0–450)

## 2014-01-19 SURGERY — EGD (ESOPHAGOGASTRODUODENOSCOPY)
Anesthesia: Moderate Sedation

## 2014-01-19 MED ORDER — BUTAMBEN-TETRACAINE-BENZOCAINE 2-2-14 % EX AERO
INHALATION_SPRAY | CUTANEOUS | Status: DC | PRN
  Administered 2014-01-19: 2 via TOPICAL

## 2014-01-19 MED ORDER — SODIUM CHLORIDE 0.9 % IV SOLN
INTRAVENOUS | Status: DC
  Administered 2014-01-19: 16:00:00 via INTRAVENOUS

## 2014-01-19 MED ORDER — SODIUM CHLORIDE 0.9 % IV BOLUS (SEPSIS): 500.0000 mL | Freq: Once | INTRAVENOUS | Status: DC

## 2014-01-19 MED ORDER — FENTANYL CITRATE 0.05 MG/ML IJ SOLN
INTRAMUSCULAR | Status: DC | PRN
  Administered 2014-01-19: 25 ug via INTRAVENOUS
  Administered 2014-01-19: 15 ug via INTRAVENOUS

## 2014-01-19 MED ORDER — BUDESONIDE 0.25 MG/2ML IN SUSP
0.2500 mg | Freq: Two times a day (BID) | RESPIRATORY_TRACT | Status: DC
  Administered 2014-01-19 – 2014-01-27 (×17): 0.25 mg via RESPIRATORY_TRACT
  Filled 2014-01-19 (×33): qty 2

## 2014-01-19 MED ORDER — PNEUMOCOCCAL VAC POLYVALENT 25 MCG/0.5ML IJ INJ
0.5000 mL | INJECTION | INTRAMUSCULAR | Status: DC
  Filled 2014-01-19 (×2): qty 0.5

## 2014-01-19 MED ORDER — MIDAZOLAM HCL 10 MG/2ML IJ SOLN
INTRAMUSCULAR | Status: DC | PRN
  Administered 2014-01-19 (×2): 2 mg via INTRAVENOUS

## 2014-01-19 MED ORDER — SODIUM CHLORIDE 0.9 % IV SOLN: INTRAVENOUS | Status: DC

## 2014-01-19 MED ORDER — SODIUM CHLORIDE 0.9 % IV SOLN
1020.0000 mg | Freq: Once | INTRAVENOUS | Status: AC
  Administered 2014-01-19: 1020 mg via INTRAVENOUS
  Filled 2014-01-19: qty 34

## 2014-01-19 MED ORDER — MAGNESIUM CITRATE PO SOLN
1.0000 | Freq: Once | ORAL | Status: AC
  Administered 2014-01-19: 1 via ORAL
  Filled 2014-01-19: qty 296

## 2014-01-19 MED ORDER — PANTOPRAZOLE SODIUM 40 MG PO TBEC
40.0000 mg | DELAYED_RELEASE_TABLET | Freq: Every day | ORAL | Status: DC
  Administered 2014-01-20 – 2014-01-27 (×8): 40 mg via ORAL
  Filled 2014-01-19 (×8): qty 1

## 2014-01-19 MED ORDER — LORATADINE 10 MG PO TABS
10.0000 mg | ORAL_TABLET | Freq: Every day | ORAL | Status: DC
  Administered 2014-01-19 – 2014-01-27 (×9): 10 mg via ORAL
  Filled 2014-01-19 (×9): qty 1

## 2014-01-19 MED ORDER — FENTANYL CITRATE 0.05 MG/ML IJ SOLN
INTRAMUSCULAR | Status: AC
  Filled 2014-01-19: qty 2

## 2014-01-19 MED ORDER — MIDAZOLAM HCL 10 MG/2ML IJ SOLN
INTRAMUSCULAR | Status: AC
  Filled 2014-01-19: qty 2

## 2014-01-19 MED ORDER — POLYETHYLENE GLYCOL 3350 17 GM/SCOOP PO POWD
1.0000 | Freq: Once | ORAL | Status: AC
  Administered 2014-01-19: 1 via ORAL
  Filled 2014-01-19: qty 255

## 2014-01-19 NOTE — Op Note (Signed)
[facility] [place], [id number]   ENDOSCOPY PROCEDURE REPORT  PATIENT: Dawn, Montoya  MR#: [id number] BIRTHDATE: 14-Mar-1932 , 82  yrs. old GENDER: Female  ENDOSCOPIST: Clarene Essex, MD REFERRED BY:  PROCEDURE DATE:  01/19/2014 PROCEDURE:   EGD, diagnostic ASA CLASS:   Class II INDICATIONS:Acute post hemorrhagic anemia and Melena.  MEDICATIONS: Fentanyl 40 mcg IV and Versed 4 mg IV  TOPICAL ANESTHETIC:  DESCRIPTION OF PROCEDURE:   After the risks benefits and alternatives of the procedure were thoroughly explained, informed consent was obtained.  The [id number] ([id number])  endoscope was introduced through the mouth and advanced to the third portion of the duodenum , limited by Without limitations.   The instrument was slowly withdrawn as the mucosa was fully examined.the findings are recorded below but there was no signs of bleeding or any at risk lesions and the patient tolerated the procedure well there was no obvious immediate complication         FINDINGS:1. Tiny hiatal hernia 2. Small duodenal lipoma just below ampulla 3 otherwise within normal limits to the third part of the duodenum COMPLICATIONS:none  ENDOSCOPIC IMPRESSION:above   RECOMMENDATIONS:Will give clear liquid and discussed colonoscopy which hopefully we can get done Monday or Tuesday   REPEAT EXAM: as needed   _______________________________ Clarene Essex, MD eSigned:  Clarene Essex, MD 01/19/2014 1:31 PM    CC:  PATIENT NAME:  Dawn, Montoya MR#: [id number]

## 2014-01-19 NOTE — Progress Notes (Signed)
[facility] PROGRESS NOTE  Kollins Fenter WPY:[id number] DOB: Jun 23, 1932 DOA: 01/18/2014 PCP: No primary provider on file.  Assessment/Plan: #1 acute on chronic respiratory failure  -due to COPD exacerbation and bronchitis/Flu -BNP WNL, no signf of fluid overload on PE and also no vascular congestion or edema on CXR. -troponin neg; follow 2-D echo -follow Flu by PCR and if negative discontinue tamiflu -treatment for COPD as mentioned below (problem #2)  #2 acute COPD exacerbation  Patient had presented with upper respiratory symptoms worsening cough/shortness of breath and increased work of breathing/wheezing. -will continue IV Levaquin, oxygen supplementation, scheduled nebulizer treatments, IV Solu-Medrol.  -start pulmicort -follow Flu by PCR  #3 melena/probable upper GI bleed  Patient with complaints of black tarry stools. Patient has been using also a lot of ibuprofen a few days prior to admission.  -Patient's hemoglobin currently at 7.9.  -GI consulted and might need EGD -continue PPI -continue following Hgb trend  #4 leukocytosis  -Likely reactive leukocytosis vs bronchitis. -Chest x-ray is negative for acute infiltrates -trending down after IVF's and levaquin -continue monitoring  #5 hypothyroidism  -TSH WNL. Continue home dose Synthroid.   #6 hypertension  Stable -will continue holding antihypertensive agents at this moment.  #7 anemia  -Appears to be secondary to iron deficiency -continue protonix, transfusion if less than 7 -GI consulted for EGD/colonoscopy if needed (patient has never received any screening) -IV iron per pharmacy  #8 prophylaxis  PPI for GI prophylaxis. SCDs for DVT prophylaxis.      Code Status: Full Family Communication: no family at bedside Disposition Plan: home when medically stable   Consultants:  GI   Procedures:  See below for x-ray reports  2-D echo pending  Antibiotics:  levaquin    HPI/Subjective: Breathing easier, no CP and no fever. Patient able to speak in full sentences.  Objective: Filed Vitals:   01/19/14 0800  BP: 126/39  Pulse: 71  Temp: 97.7 F (36.5 C)  Resp: 15    Intake/Output Summary (Last 24 hours) at 01/19/14 0906 Last data filed at 01/19/14 0800  Gross per 24 hour  Intake   1000 ml  Output    850 ml  Net    150 ml   Filed Weights   01/18/14 2000 01/19/14 0500  Weight: 127.3 kg (280 lb 10.3 oz) 127.3 kg (280 lb 10.3 oz)    Exam:   General:  Feeling better and able to speak in full sentences; no fever  Cardiovascular: S1 and S2, no rubs or gallops  Respiratory: scattered rhonchi, positive exp wheezing; no crackles  Abdomen: soft, NT, ND, positive BS; obese.  Musculoskeletal: trace edema bilaterally, no cyanosis  Data Reviewed: Basic Metabolic Panel:  Recent Labs Lab 01/18/14 1532 01/18/14 2100 01/19/14 0325  NA 134*  --  137  K 4.1  --  4.9  CL 94*  --  103  CO2 26  --  25  GLUCOSE 128*  --  187*  BUN 25*  --  19  CREATININE 0.88  --  0.85  CALCIUM 9.2  --  8.7  MG  --  2.2  --    Liver Function Tests:  Recent Labs Lab 01/18/14 1532 01/19/14 0325  AST 16 15  ALT 16 14  ALKPHOS 56 54  BILITOT 0.3 0.4  PROT 6.6 6.1  ALBUMIN 3.5 3.3*    Recent Labs Lab 01/18/14 1532  LIPASE 32   CBC:  Recent Labs Lab 01/18/14 1532 01/18/14 2100 01/19/14 0325  WBC  11.9* 11.9* 10.5  NEUTROABS 9.3*  --   --   HGB 8.3* 9.1* 7.9*  HCT 25.0* 29.9* 24.0*  MCV 93.6 94.0 93.8  PLT 379 308 313   Cardiac Enzymes:  Recent Labs Lab 01/18/14 1532 01/18/14 2100 01/19/14 0325  TROPONINI <0.30 <0.30 <0.30   BNP (last 3 results)  Recent Labs  01/18/14 1532 01/19/14 0325  PROBNP 484.2* 423.1    Recent Results (from the past 240 hour(s))  MRSA PCR SCREENING     Status: None   Collection Time    01/18/14  8:27 PM      Result Value Ref Range Status   MRSA by PCR NEGATIVE  NEGATIVE Final   Comment:             The GeneXpert MRSA Assay (FDA     approved for NASAL specimens     only), is one component of a     comprehensive MRSA colonization     surveillance program. It is not     intended to diagnose MRSA     infection nor to guide or     monitor treatment for     MRSA infections.     Studies: Dg Abd Acute W/chest  01/18/2014   CLINICAL DATA:  Cough, shortness of breath, abdominal pain. Abdominal distention.  EXAM: ACUTE ABDOMEN SERIES (ABDOMEN 2 VIEW & CHEST 1 VIEW)  COMPARISON:  None.  FINDINGS: Mild hyperinflation of the lungs. Heart is borderline in size. No confluent airspace opacities or effusions.  Nonobstructive bowel gas pattern. Prior cholecystectomy. Moderate stool in the colon. No free air, organomegaly or suspicious calcification.  IMPRESSION: COPD.  No active cardiopulmonary disease.  No evidence of bowel obstruction or free air. Moderate stool burden in the colon.   Electronically Signed   By: Rolm Baptise M.D.   On: 01/18/2014 16:15    Scheduled Meds: . budesonide (PULMICORT) nebulizer solution  0.25 mg Nebulization BID  . dextromethorphan-guaiFENesin  2 tablet Oral BID  . ipratropium-albuterol  3 mL Nebulization Q4H  . levofloxacin (LEVAQUIN) IV  500 mg Intravenous Q24H  . levothyroxine  75 mcg Oral QAC breakfast  . methylPREDNISolone (SOLU-MEDROL) injection  80 mg Intravenous 3 times per day  . oseltamivir  75 mg Oral BID  . pantoprazole (PROTONIX) IV  80 mg Intravenous Once  . [START ON 01/20/2014] pneumococcal 23 valent vaccine  0.5 mL Intramuscular Tomorrow-1000  . sodium chloride  1,000 mL Intravenous Once  . sodium chloride  3 mL Intravenous Q12H   Continuous Infusions: . sodium chloride 75 mL/hr at 01/18/14 2100  . pantoprozole (PROTONIX) infusion 8 mg/hr (01/19/14 0600)    Principal Problem:   Acute on chronic respiratory failure Active Problems:   COPD (chronic obstructive pulmonary disease)   COPD with acute exacerbation   Anemia   Melena   GIB  (gastrointestinal bleeding)   Leukocytosis   HTN (hypertension)   Hypothyroidism    Time spent: >30 minutes    Barton Dubois  [facility] Pager [phone]. If 7PM-7AM, please contact night-coverage at [email or web address], password [email or web address] 01/19/2014, 9:06 AM  LOS: 1 day

## 2014-01-19 NOTE — Progress Notes (Signed)
Spoke with Arbie Cookey from Infectious Disease and confirmed patient could be placed off of droplet precautions.

## 2014-01-19 NOTE — Progress Notes (Signed)
MEDICATION RELATED CONSULT NOTE - INITIAL   Pharmacy Consult for IV iron replacement therapy Indication: Iron deficiency anemia  No Known Allergies  Patient Measurements: Height: 5\' 3"  (160 cm) Weight: 280 lb 10.3 oz (127.3 kg) IBW/kg (Calculated) : 52.4 Adjusted Body Weight:   Vital Signs: Temp: 97.7 F (36.5 C) (03/29 0800) Temp src: Oral (03/29 0800) BP: 126/39 mmHg (03/29 0800) Pulse Rate: 71 (03/29 0800) Intake/Output from previous day: 03/28 0701 - 03/29 0700 In: 900 [I.V.:900] Out: 850 [Urine:850] Intake/Output from this shift: Total I/O In: 100 [I.V.:75; IV Piggyback:25] Out: -   Labs:  Recent Labs  01/18/14 1532 01/18/14 2100 01/19/14 0325  WBC 11.9* 11.9* 10.5  HGB 8.3* 9.1* 7.9*  HCT 25.0* 29.9* 24.0*  PLT 379 308 313  CREATININE 0.88  --  0.85  MG  --  2.2  --   ALBUMIN 3.5  --  3.3*  PROT 6.6  --  6.1  AST 16  --  15  ALT 16  --  14  ALKPHOS 56  --  54  BILITOT 0.3  --  0.4   Estimated Creatinine Clearance: 68.7 ml/min (by C-G formula based on Cr of 0.85).   Microbiology: Recent Results (from the past 720 hour(s))  MRSA PCR SCREENING     Status: None   Collection Time    01/18/14  8:27 PM      Result Value Ref Range Status   MRSA by PCR NEGATIVE  NEGATIVE Final   Comment:            The GeneXpert MRSA Assay (FDA     approved for NASAL specimens     only), is one component of a     comprehensive MRSA colonization     surveillance program. It is not     intended to diagnose MRSA     infection nor to guide or     monitor treatment for     MRSA infections.    Medical History: Past Medical History  Diagnosis Date  . COPD (chronic obstructive pulmonary disease)   . Breast CA   . Hypertension   . HTN (hypertension) 01/18/2014  . Hypothyroidism 01/18/2014  . Medical history non-contributory   . Shortness of breath   . Pneumonia     Assessment: 31 YOF admitted with SOB, also possible GIB - placed on protonix gtt. Blood loss anemia  with iron deficiency anemia?? Pharmacy asked to dose IV iron for iron deficiency anemia (labs as below):    Iron/TIBC/Ferritin    Component Value Date/Time   IRON 36* 01/18/2014 2100   TIBC 308 01/18/2014 2100   FERRITIN 68 01/18/2014 2100   Calculated iron deficiency with goal Hgb = 14 is 1375mg  iron  Goal of Therapy:  Replete Iron  Plan:   Feraheme 1020mg  IVPB x 1 dose  Suggest oral iron therapy once work-up GIB complete  Doreene Eland, PharmD, BCPS.   Pager: [phone]  01/19/2014,9:39 AM

## 2014-01-19 NOTE — Consult Note (Signed)
Reason for Consult: GI bleeding Referring Physician: Hospital team  Dawn Montoya is an 80 y.o. female.  HPI: Patient with an essentially negative GI history and no previous GI procedures including no previous colonoscopy and negative family history who has been having some black stools at home and taking ibuprofen for arthritis but she has no GI history or symptoms and has no history of anemia but has not seen a doctor since she moved to [place] from [place] this summer and is currently breathing better and has no other complaint  Past Medical History  Diagnosis Date  . COPD (chronic obstructive pulmonary disease)   . Breast CA   . Hypertension   . HTN (hypertension) 01/18/2014  . Hypothyroidism 01/18/2014  . Medical history non-contributory   . Shortness of breath   . Pneumonia     Past Surgical History  Procedure Laterality Date  . Cholecystectomy    . Breast surgery    . Mastectomy Left     Family History  Problem Relation Age of Onset  . Lung cancer Sister   . Breast cancer Sister   . Breast cancer Daughter     Social History:  reports that she quit smoking about 14 years ago. Her smoking use included Cigars, Pipe, and Cigarettes. She smoked 0.00 packs per day. She does not have any smokeless tobacco history on file. She reports that she does not drink alcohol or use illicit drugs.  Allergies: No Known Allergies  Medications: I have reviewed the patient's current medications.  Results for orders placed during the hospital encounter of 01/18/14 (from the past 48 hour(s))  CBC WITH DIFFERENTIAL     Status: Abnormal   Collection Time    01/18/14  3:32 PM      Result Value Ref Range   WBC 11.9 (*) 4.0 - 10.5 K/uL   RBC 2.67 (*) 3.87 - 5.11 MIL/uL   Hemoglobin 8.3 (*) 12.0 - 15.0 g/dL   HCT 25.0 (*) 36.0 - 46.0 %   MCV 93.6  78.0 - 100.0 fL   MCH 31.1  26.0 - 34.0 pg   MCHC 33.2  30.0 - 36.0 g/dL   RDW 15.4  11.5 - 15.5 %   Platelets 379  150 - 400 K/uL   Neutrophils Relative % 78 (*) 43 - 77 %   Neutro Abs 9.3 (*) 1.7 - 7.7 K/uL   Lymphocytes Relative 15  12 - 46 %   Lymphs Abs 1.8  0.7 - 4.0 K/uL   Monocytes Relative 5  3 - 12 %   Monocytes Absolute 0.6  0.1 - 1.0 K/uL   Eosinophils Relative 1  0 - 5 %   Eosinophils Absolute 0.1  0.0 - 0.7 K/uL   Basophils Relative 1  0 - 1 %   Basophils Absolute 0.1  0.0 - 0.1 K/uL  COMPREHENSIVE METABOLIC PANEL     Status: Abnormal   Collection Time    01/18/14  3:32 PM      Result Value Ref Range   Sodium 134 (*) 137 - 147 mEq/L   Potassium 4.1  3.7 - 5.3 mEq/L   Chloride 94 (*) 96 - 112 mEq/L   CO2 26  19 - 32 mEq/L   Glucose, Bld 128 (*) 70 - 99 mg/dL   BUN 25 (*) 6 - 23 mg/dL   Creatinine, Ser 0.88  0.50 - 1.10 mg/dL   Calcium 9.2  8.4 - 10.5 mg/dL   Total Protein 6.6  6.0 - 8.3 g/dL   Albumin 3.5  3.5 - 5.2 g/dL   AST 16  0 - 37 U/L   ALT 16  0 - 35 U/L   Alkaline Phosphatase 56  39 - 117 U/L   Total Bilirubin 0.3  0.3 - 1.2 mg/dL   GFR calc non Af Amer 60 (*) >90 mL/min   GFR calc Af Amer 70 (*) >90 mL/min   Comment: (NOTE)     The eGFR has been calculated using the CKD EPI equation.     This calculation has not been validated in all clinical situations.     eGFR's persistently <90 mL/min signify possible Chronic Kidney     Disease.  PRO B NATRIURETIC PEPTIDE     Status: Abnormal   Collection Time    01/18/14  3:32 PM      Result Value Ref Range   Pro B Natriuretic peptide (BNP) 484.2 (*) 0 - 450 pg/mL  TROPONIN I     Status: None   Collection Time    01/18/14  3:32 PM      Result Value Ref Range   Troponin I <0.30  <0.30 ng/mL   Comment:            Due to the release kinetics of cTnI,     a negative result within the first hours     of the onset of symptoms does not rule out     myocardial infarction with certainty.     If myocardial infarction is still suspected,     repeat the test at appropriate intervals.  PROTIME-INR     Status: None   Collection Time    01/18/14   3:32 PM      Result Value Ref Range   Prothrombin Time 12.8  11.6 - 15.2 seconds   INR 0.98  0.00 - 1.49  LIPASE, BLOOD     Status: None   Collection Time    01/18/14  3:32 PM      Result Value Ref Range   Lipase 32  11 - 59 U/L  TYPE AND SCREEN     Status: None   Collection Time    01/18/14  3:32 PM      Result Value Ref Range   ABO/RH(D) O POS     Antibody Screen NEG     Sample Expiration 01/21/2014    ABO/RH     Status: None   Collection Time    01/18/14  3:32 PM      Result Value Ref Range   ABO/RH(D) O POS    I-STAT CG4 LACTIC ACID, ED     Status: None   Collection Time    01/18/14  3:44 PM      Result Value Ref Range   Lactic Acid, Venous 1.66  0.5 - 2.2 mmol/L  BLOOD GAS, ARTERIAL     Status: Abnormal   Collection Time    01/18/14  6:53 PM      Result Value Ref Range   O2 Content 3.0     pH, Arterial 7.422  7.350 - 7.450   pCO2 arterial 39.6  35.0 - 45.0 mmHg   pO2, Arterial 96.5  80.0 - 100.0 mmHg   Bicarbonate 25.3 (*) 20.0 - 24.0 mEq/L   TCO2 23.9  0 - 100 mmol/L   Acid-Base Excess 1.4  0.0 - 2.0 mmol/L   O2 Saturation 97.3     Patient temperature 98.6     Collection  site RIGHT RADIAL     Drawn by [phone]     Sample type ARTERIAL DRAW     Allens test (pass/fail) PASS  PASS  URINALYSIS, ROUTINE W REFLEX MICROSCOPIC     Status: Abnormal   Collection Time    01/18/14  7:20 PM      Result Value Ref Range   Color, Urine YELLOW  YELLOW   APPearance CLOUDY (*) CLEAR   Specific Gravity, Urine 1.012  1.005 - 1.030   pH 6.0  5.0 - 8.0   Glucose, UA NEGATIVE  NEGATIVE mg/dL   Hgb urine dipstick NEGATIVE  NEGATIVE   Bilirubin Urine NEGATIVE  NEGATIVE   Ketones, ur NEGATIVE  NEGATIVE mg/dL   Protein, ur NEGATIVE  NEGATIVE mg/dL   Urobilinogen, UA 0.2  0.0 - 1.0 mg/dL   Nitrite NEGATIVE  NEGATIVE   Leukocytes, UA TRACE (*) NEGATIVE  URINE MICROSCOPIC-ADD ON     Status: None   Collection Time    01/18/14  7:20 PM      Result Value Ref Range   Squamous  Epithelial / LPF RARE  RARE   WBC, UA 0-2  <3 WBC/hpf   Bacteria, UA RARE  RARE  MRSA PCR SCREENING     Status: None   Collection Time    01/18/14  8:27 PM      Result Value Ref Range   MRSA by PCR NEGATIVE  NEGATIVE   Comment:            The GeneXpert MRSA Assay (FDA     approved for NASAL specimens     only), is one component of a     comprehensive MRSA colonization     surveillance program. It is not     intended to diagnose MRSA     infection nor to guide or     monitor treatment for     MRSA infections.  CBC     Status: Abnormal   Collection Time    01/18/14  9:00 PM      Result Value Ref Range   WBC 11.9 (*) 4.0 - 10.5 K/uL   RBC 3.18 (*) 3.87 - 5.11 MIL/uL   Hemoglobin 9.1 (*) 12.0 - 15.0 g/dL   HCT 29.9 (*) 36.0 - 46.0 %   MCV 94.0  78.0 - 100.0 fL   MCH 28.6  26.0 - 34.0 pg   MCHC 30.4  30.0 - 36.0 g/dL   RDW 15.6 (*) 11.5 - 15.5 %   Platelets 308  150 - 400 K/uL  TROPONIN I     Status: None   Collection Time    01/18/14  9:00 PM      Result Value Ref Range   Troponin I <0.30  <0.30 ng/mL   Comment:            Due to the release kinetics of cTnI,     a negative result within the first hours     of the onset of symptoms does not rule out     myocardial infarction with certainty.     If myocardial infarction is still suspected,     repeat the test at appropriate intervals.  VITAMIN B12     Status: None   Collection Time    01/18/14  9:00 PM      Result Value Ref Range   Vitamin B-12 415  211 - 911 pg/mL   Comment: Performed at [facility]  Status: None   Collection Time    01/18/14  9:00 PM      Result Value Ref Range   Folate >20.0     Comment: (NOTE)     Reference Ranges            Deficient:       0.4 - 3.3 ng/mL            Indeterminate:   3.4 - 5.4 ng/mL            Normal:              > 5.4 ng/mL     Performed at [facility] TIBC     Status: Abnormal   Collection Time    01/18/14  9:00 PM      Result  Value Ref Range   Iron 36 (*) 42 - 135 ug/dL   TIBC 308  250 - 470 ug/dL   Saturation Ratios 12 (*) 20 - 55 %   UIBC 272  125 - 400 ug/dL   Comment: Performed at [facility]     Status: None   Collection Time    01/18/14  9:00 PM      Result Value Ref Range   Ferritin 68  10 - 291 ng/mL   Comment: Performed at [place]     Status: None   Collection Time    01/18/14  9:00 PM      Result Value Ref Range   Magnesium 2.2  1.5 - 2.5 mg/dL  TSH     Status: None   Collection Time    01/18/14  9:00 PM      Result Value Ref Range   TSH 3.197  0.350 - 4.500 uIU/mL   Comment: Performed at [facility]  CBC     Status: Abnormal   Collection Time    01/19/14  3:25 AM      Result Value Ref Range   WBC 10.5  4.0 - 10.5 K/uL   RBC 2.56 (*) 3.87 - 5.11 MIL/uL   Hemoglobin 7.9 (*) 12.0 - 15.0 g/dL   HCT 24.0 (*) 36.0 - 46.0 %   MCV 93.8  78.0 - 100.0 fL   MCH 30.9  26.0 - 34.0 pg   MCHC 32.9  30.0 - 36.0 g/dL   RDW 15.3  11.5 - 15.5 %   Platelets 313  150 - 400 K/uL  TROPONIN I     Status: None   Collection Time    01/19/14  3:25 AM      Result Value Ref Range   Troponin I <0.30  <0.30 ng/mL   Comment:            Due to the release kinetics of cTnI,     a negative result within the first hours     of the onset of symptoms does not rule out     myocardial infarction with certainty.     If myocardial infarction is still suspected,     repeat the test at appropriate intervals.  PRO B NATRIURETIC PEPTIDE     Status: None   Collection Time    01/19/14  3:25 AM      Result Value Ref Range   Pro B Natriuretic peptide (BNP) 423.1  0 - 450 pg/mL  COMPREHENSIVE METABOLIC PANEL     Status: Abnormal   Collection Time    01/19/14  3:25 AM      Result Value Ref Range   Sodium 137  137 - 147 mEq/L   Potassium 4.9  3.7 - 5.3 mEq/L   Chloride 103  96 - 112 mEq/L   Comment: DELTA CHECK NOTED   CO2 25  19 - 32 mEq/L   Glucose, Bld 187 (*) 70  - 99 mg/dL   BUN 19  6 - 23 mg/dL   Creatinine, Ser 0.85  0.50 - 1.10 mg/dL   Calcium 8.7  8.4 - 10.5 mg/dL   Total Protein 6.1  6.0 - 8.3 g/dL   Albumin 3.3 (*) 3.5 - 5.2 g/dL   AST 15  0 - 37 U/L   ALT 14  0 - 35 U/L   Alkaline Phosphatase 54  39 - 117 U/L   Total Bilirubin 0.4  0.3 - 1.2 mg/dL   GFR calc non Af Amer 63 (*) >90 mL/min   GFR calc Af Amer 73 (*) >90 mL/min   Comment: (NOTE)     The eGFR has been calculated using the CKD EPI equation.     This calculation has not been validated in all clinical situations.     eGFR's persistently <90 mL/min signify possible Chronic Kidney     Disease.  PROTIME-INR     Status: None   Collection Time    01/19/14  3:25 AM      Result Value Ref Range   Prothrombin Time 13.6  11.6 - 15.2 seconds   INR 1.06  0.00 - 1.49  TROPONIN I     Status: None   Collection Time    01/19/14  9:10 AM      Result Value Ref Range   Troponin I <0.30  <0.30 ng/mL   Comment:            Due to the release kinetics of cTnI,     a negative result within the first hours     of the onset of symptoms does not rule out     myocardial infarction with certainty.     If myocardial infarction is still suspected,     repeat the test at appropriate intervals.    Dg Abd Acute W/chest  01/18/2014   CLINICAL DATA:  Cough, shortness of breath, abdominal pain. Abdominal distention.  EXAM: ACUTE ABDOMEN SERIES (ABDOMEN 2 VIEW & CHEST 1 VIEW)  COMPARISON:  None.  FINDINGS: Mild hyperinflation of the lungs. Heart is borderline in size. No confluent airspace opacities or effusions.  Nonobstructive bowel gas pattern. Prior cholecystectomy. Moderate stool in the colon. No free air, organomegaly or suspicious calcification.  IMPRESSION: COPD.  No active cardiopulmonary disease.  No evidence of bowel obstruction or free air. Moderate stool burden in the colon.   Electronically Signed   By: Rolm Baptise M.D.   On: 01/18/2014 16:15    ROS negative except above Blood pressure  126/39, pulse 71, temperature 97.7 F (36.5 C), temperature source Oral, resp. rate 15, height _0  (1.6 m), weight 127.3 kg (280 lb 10.3 oz), SpO2 96.00%. Physical Exam vital signs stable afebrile no acute distress lungs are clear regular rate and rhythm abdomen is soft nontender labs reviewed  Assessment/Plan: Multiple medical problems in a patient with some black stools at home and symptomatic anemia Plan: The risks benefits methods and options of endoscopy was discussed and will proceed later today however even if she has ulcers I would probably recommend an outpatient screening colonoscopy based on her history  of breast cancer and never having a previous screening and she does not have a primary care physician here in town yet  [facility] E 01/19/2014, 10:19 AM

## 2014-01-19 NOTE — Progress Notes (Signed)
  Echocardiogram 2D Echocardiogram has been performed.  Donata Clay 01/19/2014, 11:13 AM

## 2014-01-19 NOTE — Progress Notes (Signed)
MD Dyann Kief advised patient's CXR was clear of vascular congestion, patient has no fever, and asymptomatic of flu. MD followed up by indicating droplet precautions were not needed. Infection control was contacted and advised to wait until confirmed negative screening before D/C'ing precautions.

## 2014-01-20 ENCOUNTER — Encounter (HOSPITAL_COMMUNITY)
Admission: EM | Disposition: A | Discharge status: Home Health | Payer: Self-pay | Source: Home / Self Care | Attending: Family Medicine

## 2014-01-20 ENCOUNTER — Encounter (HOSPITAL_COMMUNITY): Payer: Self-pay | Admitting: Gastroenterology

## 2014-01-20 ENCOUNTER — Encounter (HOSPITAL_COMMUNITY)
Admission: EM | Disposition: A | Discharge status: Home Health | Payer: Medicare Other | Source: Home / Self Care | Attending: Family Medicine

## 2014-01-20 HISTORY — PX: COLONOSCOPY: SHX5424

## 2014-01-20 LAB — OCCULT BLOOD, POC DEVICE: Fecal Occult Bld: POSITIVE — AB

## 2014-01-20 LAB — BASIC METABOLIC PANEL
BUN: 18 mg/dL (ref 6–23)
CALCIUM: 9.1 mg/dL (ref 8.4–10.5)
CO2: 24 mEq/L (ref 19–32)
Chloride: 102 mEq/L (ref 96–112)
Creatinine, Ser: 0.79 mg/dL (ref 0.50–1.10)
GFR, EST AFRICAN AMERICAN: 89 mL/min — AB (ref >90)
GFR, EST NON AFRICAN AMERICAN: 77 mL/min — AB (ref >90)
Glucose, Bld: 209 mg/dL — ABNORMAL HIGH (ref 70–99)
Potassium: 4.4 mEq/L (ref 3.7–5.3)
Sodium: 139 mEq/L (ref 137–147)

## 2014-01-20 LAB — URINE CULTURE

## 2014-01-20 LAB — CBC
HEMATOCRIT: 23.2 % — AB (ref 36.0–46.0)
Hemoglobin: 7.3 g/dL — ABNORMAL LOW (ref 12.0–15.0)
MCH: 30.3 pg (ref 26.0–34.0)
MCHC: 31.5 g/dL (ref 30.0–36.0)
MCV: 96.3 fL (ref 78.0–100.0)
PLATELETS: 348 10*3/uL (ref 150–400)
RBC: 2.41 MIL/uL — ABNORMAL LOW (ref 3.87–5.11)
RDW: 15.7 % — AB (ref 11.5–15.5)
WBC: 12.8 10*3/uL — AB (ref 4.0–10.5)

## 2014-01-20 LAB — GLUCOSE, CAPILLARY: GLUCOSE-CAPILLARY: 186 mg/dL — AB (ref 70–99)

## 2014-01-20 LAB — PREPARE RBC (CROSSMATCH)

## 2014-01-20 SURGERY — COLONOSCOPY
Anesthesia: Moderate Sedation

## 2014-01-20 MED ORDER — IPRATROPIUM BROMIDE 0.02 % IN SOLN
0.5000 mg | Freq: Four times a day (QID) | RESPIRATORY_TRACT | Status: DC | PRN
  Administered 2014-01-22 – 2014-01-25 (×2): 0.5 mg via RESPIRATORY_TRACT
  Filled 2014-01-20 (×3): qty 2.5

## 2014-01-20 MED ORDER — FUROSEMIDE 10 MG/ML IJ SOLN
20.0000 mg | Freq: Once | INTRAMUSCULAR | Status: AC
  Administered 2014-01-20: 20 mg via INTRAVENOUS
  Filled 2014-01-20: qty 2

## 2014-01-20 MED ORDER — SIMETHICONE 40 MG/0.6ML PO SUSP
40.0000 mg | Freq: Four times a day (QID) | ORAL | Status: DC | PRN
  Administered 2014-01-20 – 2014-01-21 (×2): 40 mg via ORAL
  Filled 2014-01-20 (×2): qty 0.6

## 2014-01-20 MED ORDER — MAGNESIUM CITRATE PO SOLN
1.0000 | Freq: Once | ORAL | Status: AC
  Administered 2014-01-20: 1 via ORAL
  Filled 2014-01-20: qty 296

## 2014-01-20 MED ORDER — MORPHINE SULFATE 2 MG/ML IJ SOLN
1.0000 mg | INTRAMUSCULAR | Status: DC | PRN
  Administered 2014-01-20 – 2014-01-21 (×3): 1 mg via INTRAVENOUS
  Filled 2014-01-20 (×3): qty 1

## 2014-01-20 MED ORDER — FENTANYL CITRATE 0.05 MG/ML IJ SOLN
INTRAMUSCULAR | Status: DC | PRN
  Administered 2014-01-20 (×2): 25 ug via INTRAVENOUS

## 2014-01-20 MED ORDER — FENTANYL CITRATE 0.05 MG/ML IJ SOLN
INTRAMUSCULAR | Status: AC
  Filled 2014-01-20: qty 2

## 2014-01-20 MED ORDER — SODIUM CHLORIDE 0.9 % IV SOLN
INTRAVENOUS | Status: DC
  Administered 2014-01-20 – 2014-01-22 (×2): via INTRAVENOUS

## 2014-01-20 MED ORDER — IPRATROPIUM-ALBUTEROL 0.5-2.5 (3) MG/3ML IN SOLN
3.0000 mL | Freq: Four times a day (QID) | RESPIRATORY_TRACT | Status: DC
  Administered 2014-01-21 – 2014-01-22 (×5): 3 mL via RESPIRATORY_TRACT
  Filled 2014-01-20 (×6): qty 3

## 2014-01-20 MED ORDER — MIDAZOLAM HCL 5 MG/5ML IJ SOLN
INTRAMUSCULAR | Status: DC | PRN
  Administered 2014-01-20: 1 mg via INTRAVENOUS
  Administered 2014-01-20: 2 mg via INTRAVENOUS
  Administered 2014-01-20: 1 mg via INTRAVENOUS

## 2014-01-20 MED ORDER — INFLUENZA VAC SPLIT QUAD 0.5 ML IM SUSP
0.5000 mL | INTRAMUSCULAR | Status: AC
  Administered 2014-01-21: 0.5 mL via INTRAMUSCULAR
  Filled 2014-01-20 (×2): qty 0.5

## 2014-01-20 MED ORDER — MIDAZOLAM HCL 10 MG/2ML IJ SOLN
INTRAMUSCULAR | Status: AC
  Filled 2014-01-20: qty 2

## 2014-01-20 MED ORDER — POLYETHYLENE GLYCOL 3350 17 G PO PACK
17.0000 g | PACK | Freq: Three times a day (TID) | ORAL | Status: DC
  Administered 2014-01-20 – 2014-01-26 (×15): 17 g via ORAL
  Filled 2014-01-20 (×24): qty 1

## 2014-01-20 MED ORDER — ALBUTEROL SULFATE (2.5 MG/3ML) 0.083% IN NEBU
2.5000 mg | INHALATION_SOLUTION | RESPIRATORY_TRACT | Status: DC | PRN
  Administered 2014-01-22 – 2014-01-25 (×2): 2.5 mg via RESPIRATORY_TRACT
  Filled 2014-01-20 (×2): qty 3

## 2014-01-20 MED ORDER — HALOPERIDOL LACTATE 5 MG/ML IJ SOLN: 0.5000 mg | Freq: Three times a day (TID) | INTRAMUSCULAR | Status: DC | PRN

## 2014-01-20 NOTE — Progress Notes (Signed)
[facility] PROGRESS NOTE  Dawn Montoya PIR:[id number] DOB: 1932/02/01 DOA: 01/18/2014 PCP: No primary provider on file.  Assessment/Plan: #1 acute on chronic respiratory failure  -due to COPD exacerbation and bronchitis -influenza by PCR negative; patient remains afebrile. Will discontinue tamiflu and droplet precautions -BNP WNL, no signf of fluid overload on PE and also no vascular congestion or edema on CXR. -troponin neg; follow 2-D echo (grade 1 diastolic dysfunction, preserved EF and no wall motion abnormalities) -continue treatment for COPD as mentioned below (problem #2)  #2 acute COPD exacerbation  Patient had presented with upper respiratory symptoms worsening cough/shortness of breath and increased work of breathing/wheezing. -will continue IV Levaquin, oxygen supplementation, scheduled nebulizer treatments, IV Solu-Medrol and pulmicort -symptoms improving and just minimal wheezing on exam this morning -negative Flu by PCR  #3 melena/probable upper GI bleed  Patient with complaints of black tarry stools. Patient has been using also a lot of ibuprofen a few days prior to admission.  -Patient's hemoglobin currently at 7.3 this morning and complaining of generalized weakness.  -GI consulted, status post EGD (no source of acute bleeding identified) -continue PPI by mouth now -plan is for colonoscopy -continue following Hgb trend and transfuse 1 unit of PRBC given symptomatic anemia  #4 leukocytosis  -Likely reactive leukocytosis vs bronchitis vs use of steroids. -Chest x-ray is negative for acute infiltrates -continue monitoring  #5 hypothyroidism  -TSH WNL. Continue home dose Synthroid.   #6 hypertension  -Stable to soft; especially after versed and fentanyl given for EGD. -will continue holding antihypertensive agents at this moment. -follow VS  #7 anemia  -Appears to be secondary to iron deficiency -continue protonix, will transfuse 1 unit as Hgb 7.3 and  patient is symptomatic  -GI consulted for EGD (no source of acute bleeding identified); plan is for colonoscopy -IV iron per pharmacy ordered  #8 prophylaxis  PPI for GI prophylaxis. SCDs for DVT prophylaxis.  #9 chronic diastolic heart failure: grade 1 and compensated. EF 65% -continue daily weight and strict intake and output  -will instruct patient to follow low sodium diet once diet advance.    Code Status: Full Family Communication: no family at bedside Disposition Plan: home when medically stable   Consultants:  GI   Procedures:  See below for x-ray reports  2-D echo pending  - Left ventricle: The cavity size was normal. Wall thickness was normal. Systolic function was vigorous. The estimated ejection fraction was in the range of 65% to 70%. Wall motion was normal; there were no regional wall motion abnormalities. Doppler parameters are consistent with abnormal left ventricular relaxation (grade 1 diastolic dysfunction). The E/e' ratio is <10, suggesting normal LV filling pressure. - Aortic valve: Poorly visualized. There was no stenosis. - Mitral valve: Poorly visualized. No significant regurgitation. - Left atrium: The atrium was normal in size. - Right atrium: Moderately dilated (22 cm2). - Tricuspid valve: Mild regurgitation. Incomplete TR jet does not allow measurement of gradient. - Inferior vena cava: The vessel was normal in size; the respirophasic diameter changes were in the normal range (= 50%); findings are consistent with normal central venous pressure. - Pericardium, extracardiac: There was no pericardial effusion.   Antibiotics:  levaquin   HPI/Subjective: Breathing a lot better; still with mild wheezing and complaining of generalized weakness. No CP and no fever. Patient able to speak in full sentences.  Objective: Filed Vitals:   01/20/14 0400  BP:   Pulse:   Temp: 98.2 F (36.8 C)  Resp:  Intake/Output Summary (Last 24  hours) at 01/20/14 0833 Last data filed at 01/20/14 0600  Gross per 24 hour  Intake    550 ml  Output    325 ml  Net    225 ml   Filed Weights   01/18/14 2000 01/19/14 0500 01/20/14 0400  Weight: 127.3 kg (280 lb 10.3 oz) 127.3 kg (280 lb 10.3 oz) 124.1 kg (273 lb 9.5 oz)    Exam:   General:  Feeling better; no fever  Cardiovascular: S1 and S2, no rubs or gallops  Respiratory: scattered rhonchi, mild positive exp wheezing; no crackles  Abdomen: soft, NT, ND, positive BS; obese.  Musculoskeletal: trace edema bilaterally, no cyanosis  Data Reviewed: Basic Metabolic Panel:  Recent Labs Lab 01/18/14 1532 01/18/14 2100 01/19/14 0325 01/20/14 0315  NA 134*  --  137 139  K 4.1  --  4.9 4.4  CL 94*  --  103 102  CO2 26  --  25 24  GLUCOSE 128*  --  187* 209*  BUN 25*  --  19 18  CREATININE 0.88  --  0.85 0.79  CALCIUM 9.2  --  8.7 9.1  MG  --  2.2  --   --    Liver Function Tests:  Recent Labs Lab 01/18/14 1532 01/19/14 0325  AST 16 15  ALT 16 14  ALKPHOS 56 54  BILITOT 0.3 0.4  PROT 6.6 6.1  ALBUMIN 3.5 3.3*    Recent Labs Lab 01/18/14 1532  LIPASE 32   CBC:  Recent Labs Lab 01/18/14 1532 01/18/14 2100 01/19/14 0325 01/19/14 1143 01/20/14 0315  WBC 11.9* 11.9* 10.5 9.4 12.8*  NEUTROABS 9.3*  --   --   --   --   HGB 8.3* 9.1* 7.9* 8.0* 7.3*  HCT 25.0* 29.9* 24.0* 24.5* 23.2*  MCV 93.6 94.0 93.8 94.2 96.3  PLT 379 308 313 329 348   Cardiac Enzymes:  Recent Labs Lab 01/18/14 1532 01/18/14 2100 01/19/14 0325 01/19/14 0910  TROPONINI <0.30 <0.30 <0.30 <0.30   BNP (last 3 results)  Recent Labs  01/18/14 1532 01/19/14 0325  PROBNP 484.2* 423.1    Recent Results (from the past 240 hour(s))  MRSA PCR SCREENING     Status: None   Collection Time    01/18/14  8:27 PM      Result Value Ref Range Status   MRSA by PCR NEGATIVE  NEGATIVE Final   Comment:            The GeneXpert MRSA Assay (FDA     approved for NASAL specimens      only), is one component of a     comprehensive MRSA colonization     surveillance program. It is not     intended to diagnose MRSA     infection nor to guide or     monitor treatment for     MRSA infections.     Studies: Dg Abd Acute W/chest  01/18/2014   CLINICAL DATA:  Cough, shortness of breath, abdominal pain. Abdominal distention.  EXAM: ACUTE ABDOMEN SERIES (ABDOMEN 2 VIEW & CHEST 1 VIEW)  COMPARISON:  None.  FINDINGS: Mild hyperinflation of the lungs. Heart is borderline in size. No confluent airspace opacities or effusions.  Nonobstructive bowel gas pattern. Prior cholecystectomy. Moderate stool in the colon. No free air, organomegaly or suspicious calcification.  IMPRESSION: COPD.  No active cardiopulmonary disease.  No evidence of bowel obstruction or free air. Moderate stool burden in  the colon.   Electronically Signed   By: Rolm Baptise M.D.   On: 01/18/2014 16:15    Scheduled Meds: . budesonide (PULMICORT) nebulizer solution  0.25 mg Nebulization BID  . dextromethorphan-guaiFENesin  2 tablet Oral BID  . furosemide  20 mg Intravenous Once  . ipratropium-albuterol  3 mL Nebulization Q4H  . levofloxacin (LEVAQUIN) IV  500 mg Intravenous Q24H  . levothyroxine  75 mcg Oral QAC breakfast  . loratadine  10 mg Oral Daily  . magnesium citrate  1 Bottle Oral Once  . methylPREDNISolone (SOLU-MEDROL) injection  80 mg Intravenous 3 times per day  . pantoprazole  40 mg Oral Daily  . pneumococcal 23 valent vaccine  0.5 mL Intramuscular Tomorrow-1000  . sodium chloride  1,000 mL Intravenous Once  . sodium chloride  500 mL Intravenous Once  . sodium chloride  3 mL Intravenous Q12H   Continuous Infusions: . sodium chloride 20 mL/hr at 01/19/14 1551  . sodium chloride      Principal Problem:   Acute on chronic respiratory failure Active Problems:   COPD (chronic obstructive pulmonary disease)   COPD with acute exacerbation   Anemia   Melena   GIB (gastrointestinal bleeding)    Leukocytosis   HTN (hypertension)   Hypothyroidism    Time spent: >30 minutes    Barton Dubois  [facility] Pager [phone]. If 7PM-7AM, please contact night-coverage at [email or web address], password [facility] 01/20/2014, 8:33 AM  LOS: 2 days

## 2014-01-20 NOTE — Op Note (Signed)
[facility] [place], [id number]   COLONOSCOPY PROCEDURE REPORT  PATIENT: Dawn, Montoya  MR#: [id number] BIRTHDATE: December 02, 1931 , 82  yrs. old GENDER: Female ENDOSCOPIST: Laurence Spates, MD REFERRED BY:   [facility] PROCEDURE DATE:  01/20/2014 PROCEDURE:    Colonoscopy and Biopsy ASA CLASS:  class III INDICATIONS:  G.I. bleeding,negative EGD MEDICATIONS: fentanyl 50 mcg, versed 4 mg IV  DESCRIPTION OF PROCEDURE: A digital exam was performed in the Pentax adult scope was inserted in advance. The prep was marginal despite her history of taking the entire prep. Some areas were obscurred by solid stool the cecum was reached. The appendiceal orifice and IC valve were dignified. There was no bleeding throughout the entire:. No AVMs were seen. The scope was withdrawn and tranverse colon lipoma was seen. It was biopsy. Approximately 70 to 80% of the mucosa could be seen due to the marginal prep. There was no bleeding, AVMs, diverticulosis throughout the entire colon. The scope was withdrawn the patient tolerated procedure well.     COMPLICATIONS: None  ENDOSCOPIC IMPRESSION: 1. G.I. bleeding. No gross lesion seen on colonoscopy. Source of bleeding is still unclear at this time.  RECOMMENDATIONS: 1. continue support with IV fluids and blood if needed. 2. Will give Miralax to clear G.I. track. 3. If hemoglobin continues to drop, will need G.I. bleeding scan.    _______________________________ Lorrin MaisLaurence Spates, MD 01/20/2014 12:41 PM

## 2014-01-20 NOTE — Progress Notes (Signed)
Patient vomited about 20cc blackish emesis, Zofran given and Dr. Dyann Kief notified. Will continue to assess patient

## 2014-01-20 NOTE — Progress Notes (Signed)
Patient transferred from stepdown to 1443, alert and oriented, C/O sever abdominal cramp/gas pain. Notified Dr. Dyann Kief and he ordered simethicone and morphine, med given will continue to monitor patient. Reviewed plan of care with patient and oriented patient to room/unit. Will continue to F/U with plan of care.

## 2014-01-20 NOTE — Interval H&P Note (Signed)
History and Physical Interval Note:  01/20/2014 11:59 AM  Dawn Montoya  has presented today for surgery, with the diagnosis of .  The various methods of treatment have been discussed with the patient and family. After consideration of risks, benefits and other options for treatment, the patient has consented to  Procedure(s): COLONOSCOPY (N/A) as a surgical intervention .  The patient's history has been reviewed, patient examined, no change in status, stable for surgery.  I have reviewed the patient's chart and labs.  Questions were answered to the patient's satisfaction.     Jeanette Moffatt JR,[name] L

## 2014-01-20 NOTE — H&P (View-Only) (Signed)
Reason for Consult: GI bleeding Referring Physician: Hospital team  Dawn Montoya is an 80 y.o. female.  HPI: Patient with an essentially negative GI history and no previous GI procedures including no previous colonoscopy and negative family history who has been having some black stools at home and taking ibuprofen for arthritis but she has no GI history or symptoms and has no history of anemia but has not seen a doctor since she moved to [place] from [place] this summer and is currently breathing better and has no other complaint  Past Medical History  Diagnosis Date  . COPD (chronic obstructive pulmonary disease)   . Breast CA   . Hypertension   . HTN (hypertension) 01/18/2014  . Hypothyroidism 01/18/2014  . Medical history non-contributory   . Shortness of breath   . Pneumonia     Past Surgical History  Procedure Laterality Date  . Cholecystectomy    . Breast surgery    . Mastectomy Left     Family History  Problem Relation Age of Onset  . Lung cancer Sister   . Breast cancer Sister   . Breast cancer Daughter     Social History:  reports that she quit smoking about 14 years ago. Her smoking use included Cigars, Pipe, and Cigarettes. She smoked 0.00 packs per day. She does not have any smokeless tobacco history on file. She reports that she does not drink alcohol or use illicit drugs.  Allergies: No Known Allergies  Medications: I have reviewed the patient's current medications.  Results for orders placed during the hospital encounter of 01/18/14 (from the past 48 hour(s))  CBC WITH DIFFERENTIAL     Status: Abnormal   Collection Time    01/18/14  3:32 PM      Result Value Ref Range   WBC 11.9 (*) 4.0 - 10.5 K/uL   RBC 2.67 (*) 3.87 - 5.11 MIL/uL   Hemoglobin 8.3 (*) 12.0 - 15.0 g/dL   HCT 25.0 (*) 36.0 - 46.0 %   MCV 93.6  78.0 - 100.0 fL   MCH 31.1  26.0 - 34.0 pg   MCHC 33.2  30.0 - 36.0 g/dL   RDW 15.4  11.5 - 15.5 %   Platelets 379  150 - 400 K/uL   Neutrophils Relative % 78 (*) 43 - 77 %   Neutro Abs 9.3 (*) 1.7 - 7.7 K/uL   Lymphocytes Relative 15  12 - 46 %   Lymphs Abs 1.8  0.7 - 4.0 K/uL   Monocytes Relative 5  3 - 12 %   Monocytes Absolute 0.6  0.1 - 1.0 K/uL   Eosinophils Relative 1  0 - 5 %   Eosinophils Absolute 0.1  0.0 - 0.7 K/uL   Basophils Relative 1  0 - 1 %   Basophils Absolute 0.1  0.0 - 0.1 K/uL  COMPREHENSIVE METABOLIC PANEL     Status: Abnormal   Collection Time    01/18/14  3:32 PM      Result Value Ref Range   Sodium 134 (*) 137 - 147 mEq/L   Potassium 4.1  3.7 - 5.3 mEq/L   Chloride 94 (*) 96 - 112 mEq/L   CO2 26  19 - 32 mEq/L   Glucose, Bld 128 (*) 70 - 99 mg/dL   BUN 25 (*) 6 - 23 mg/dL   Creatinine, Ser 0.88  0.50 - 1.10 mg/dL   Calcium 9.2  8.4 - 10.5 mg/dL   Total Protein 6.6  6.0 - 8.3 g/dL   Albumin 3.5  3.5 - 5.2 g/dL   AST 16  0 - 37 U/L   ALT 16  0 - 35 U/L   Alkaline Phosphatase 56  39 - 117 U/L   Total Bilirubin 0.3  0.3 - 1.2 mg/dL   GFR calc non Af Amer 60 (*) >90 mL/min   GFR calc Af Amer 70 (*) >90 mL/min   Comment: (NOTE)     The eGFR has been calculated using the CKD EPI equation.     This calculation has not been validated in all clinical situations.     eGFR's persistently <90 mL/min signify possible Chronic Kidney     Disease.  PRO B NATRIURETIC PEPTIDE     Status: Abnormal   Collection Time    01/18/14  3:32 PM      Result Value Ref Range   Pro B Natriuretic peptide (BNP) 484.2 (*) 0 - 450 pg/mL  TROPONIN I     Status: None   Collection Time    01/18/14  3:32 PM      Result Value Ref Range   Troponin I <0.30  <0.30 ng/mL   Comment:            Due to the release kinetics of cTnI,     a negative result within the first hours     of the onset of symptoms does not rule out     myocardial infarction with certainty.     If myocardial infarction is still suspected,     repeat the test at appropriate intervals.  PROTIME-INR     Status: None   Collection Time    01/18/14   3:32 PM      Result Value Ref Range   Prothrombin Time 12.8  11.6 - 15.2 seconds   INR 0.98  0.00 - 1.49  LIPASE, BLOOD     Status: None   Collection Time    01/18/14  3:32 PM      Result Value Ref Range   Lipase 32  11 - 59 U/L  TYPE AND SCREEN     Status: None   Collection Time    01/18/14  3:32 PM      Result Value Ref Range   ABO/RH(D) O POS     Antibody Screen NEG     Sample Expiration 01/21/2014    ABO/RH     Status: None   Collection Time    01/18/14  3:32 PM      Result Value Ref Range   ABO/RH(D) O POS    I-STAT CG4 LACTIC ACID, ED     Status: None   Collection Time    01/18/14  3:44 PM      Result Value Ref Range   Lactic Acid, Venous 1.66  0.5 - 2.2 mmol/L  BLOOD GAS, ARTERIAL     Status: Abnormal   Collection Time    01/18/14  6:53 PM      Result Value Ref Range   O2 Content 3.0     pH, Arterial 7.422  7.350 - 7.450   pCO2 arterial 39.6  35.0 - 45.0 mmHg   pO2, Arterial 96.5  80.0 - 100.0 mmHg   Bicarbonate 25.3 (*) 20.0 - 24.0 mEq/L   TCO2 23.9  0 - 100 mmol/L   Acid-Base Excess 1.4  0.0 - 2.0 mmol/L   O2 Saturation 97.3     Patient temperature 98.6     Collection  site RIGHT RADIAL     Drawn by [phone]     Sample type ARTERIAL DRAW     Allens test (pass/fail) PASS  PASS  URINALYSIS, ROUTINE W REFLEX MICROSCOPIC     Status: Abnormal   Collection Time    01/18/14  7:20 PM      Result Value Ref Range   Color, Urine YELLOW  YELLOW   APPearance CLOUDY (*) CLEAR   Specific Gravity, Urine 1.012  1.005 - 1.030   pH 6.0  5.0 - 8.0   Glucose, UA NEGATIVE  NEGATIVE mg/dL   Hgb urine dipstick NEGATIVE  NEGATIVE   Bilirubin Urine NEGATIVE  NEGATIVE   Ketones, ur NEGATIVE  NEGATIVE mg/dL   Protein, ur NEGATIVE  NEGATIVE mg/dL   Urobilinogen, UA 0.2  0.0 - 1.0 mg/dL   Nitrite NEGATIVE  NEGATIVE   Leukocytes, UA TRACE (*) NEGATIVE  URINE MICROSCOPIC-ADD ON     Status: None   Collection Time    01/18/14  7:20 PM      Result Value Ref Range   Squamous  Epithelial / LPF RARE  RARE   WBC, UA 0-2  <3 WBC/hpf   Bacteria, UA RARE  RARE  MRSA PCR SCREENING     Status: None   Collection Time    01/18/14  8:27 PM      Result Value Ref Range   MRSA by PCR NEGATIVE  NEGATIVE   Comment:            The GeneXpert MRSA Assay (FDA     approved for NASAL specimens     only), is one component of a     comprehensive MRSA colonization     surveillance program. It is not     intended to diagnose MRSA     infection nor to guide or     monitor treatment for     MRSA infections.  CBC     Status: Abnormal   Collection Time    01/18/14  9:00 PM      Result Value Ref Range   WBC 11.9 (*) 4.0 - 10.5 K/uL   RBC 3.18 (*) 3.87 - 5.11 MIL/uL   Hemoglobin 9.1 (*) 12.0 - 15.0 g/dL   HCT 29.9 (*) 36.0 - 46.0 %   MCV 94.0  78.0 - 100.0 fL   MCH 28.6  26.0 - 34.0 pg   MCHC 30.4  30.0 - 36.0 g/dL   RDW 15.6 (*) 11.5 - 15.5 %   Platelets 308  150 - 400 K/uL  TROPONIN I     Status: None   Collection Time    01/18/14  9:00 PM      Result Value Ref Range   Troponin I <0.30  <0.30 ng/mL   Comment:            Due to the release kinetics of cTnI,     a negative result within the first hours     of the onset of symptoms does not rule out     myocardial infarction with certainty.     If myocardial infarction is still suspected,     repeat the test at appropriate intervals.  VITAMIN B12     Status: None   Collection Time    01/18/14  9:00 PM      Result Value Ref Range   Vitamin B-12 415  211 - 911 pg/mL   Comment: Performed at [facility]  Status: None   Collection Time    01/18/14  9:00 PM      Result Value Ref Range   Folate >20.0     Comment: (NOTE)     Reference Ranges            Deficient:       0.4 - 3.3 ng/mL            Indeterminate:   3.4 - 5.4 ng/mL            Normal:              > 5.4 ng/mL     Performed at [facility]     Status: Abnormal   Collection Time    01/18/14  9:00 PM      Result  Value Ref Range   Iron 36 (*) 42 - 135 ug/dL   TIBC 308  250 - 470 ug/dL   Saturation Ratios 12 (*) 20 - 55 %   UIBC 272  125 - 400 ug/dL   Comment: Performed at [facility]     Status: None   Collection Time    01/18/14  9:00 PM      Result Value Ref Range   Ferritin 68  10 - 291 ng/mL   Comment: Performed at [place]     Status: None   Collection Time    01/18/14  9:00 PM      Result Value Ref Range   Magnesium 2.2  1.5 - 2.5 mg/dL  TSH     Status: None   Collection Time    01/18/14  9:00 PM      Result Value Ref Range   TSH 3.197  0.350 - 4.500 uIU/mL   Comment: Performed at [facility]  CBC     Status: Abnormal   Collection Time    01/19/14  3:25 AM      Result Value Ref Range   WBC 10.5  4.0 - 10.5 K/uL   RBC 2.56 (*) 3.87 - 5.11 MIL/uL   Hemoglobin 7.9 (*) 12.0 - 15.0 g/dL   HCT 24.0 (*) 36.0 - 46.0 %   MCV 93.8  78.0 - 100.0 fL   MCH 30.9  26.0 - 34.0 pg   MCHC 32.9  30.0 - 36.0 g/dL   RDW 15.3  11.5 - 15.5 %   Platelets 313  150 - 400 K/uL  TROPONIN I     Status: None   Collection Time    01/19/14  3:25 AM      Result Value Ref Range   Troponin I <0.30  <0.30 ng/mL   Comment:            Due to the release kinetics of cTnI,     a negative result within the first hours     of the onset of symptoms does not rule out     myocardial infarction with certainty.     If myocardial infarction is still suspected,     repeat the test at appropriate intervals.  PRO B NATRIURETIC PEPTIDE     Status: None   Collection Time    01/19/14  3:25 AM      Result Value Ref Range   Pro B Natriuretic peptide (BNP) 423.1  0 - 450 pg/mL  COMPREHENSIVE METABOLIC PANEL     Status: Abnormal   Collection Time    01/19/14  3:25 AM      Result Value Ref Range   Sodium 137  137 - 147 mEq/L   Potassium 4.9  3.7 - 5.3 mEq/L   Chloride 103  96 - 112 mEq/L   Comment: DELTA CHECK NOTED   CO2 25  19 - 32 mEq/L   Glucose, Bld 187 (*) 70  - 99 mg/dL   BUN 19  6 - 23 mg/dL   Creatinine, Ser 0.85  0.50 - 1.10 mg/dL   Calcium 8.7  8.4 - 10.5 mg/dL   Total Protein 6.1  6.0 - 8.3 g/dL   Albumin 3.3 (*) 3.5 - 5.2 g/dL   AST 15  0 - 37 U/L   ALT 14  0 - 35 U/L   Alkaline Phosphatase 54  39 - 117 U/L   Total Bilirubin 0.4  0.3 - 1.2 mg/dL   GFR calc non Af Amer 63 (*) >90 mL/min   GFR calc Af Amer 73 (*) >90 mL/min   Comment: (NOTE)     The eGFR has been calculated using the CKD EPI equation.     This calculation has not been validated in all clinical situations.     eGFR's persistently <90 mL/min signify possible Chronic Kidney     Disease.  PROTIME-INR     Status: None   Collection Time    01/19/14  3:25 AM      Result Value Ref Range   Prothrombin Time 13.6  11.6 - 15.2 seconds   INR 1.06  0.00 - 1.49  TROPONIN I     Status: None   Collection Time    01/19/14  9:10 AM      Result Value Ref Range   Troponin I <0.30  <0.30 ng/mL   Comment:            Due to the release kinetics of cTnI,     a negative result within the first hours     of the onset of symptoms does not rule out     myocardial infarction with certainty.     If myocardial infarction is still suspected,     repeat the test at appropriate intervals.    Dg Abd Acute W/chest  01/18/2014   CLINICAL DATA:  Cough, shortness of breath, abdominal pain. Abdominal distention.  EXAM: ACUTE ABDOMEN SERIES (ABDOMEN 2 VIEW & CHEST 1 VIEW)  COMPARISON:  None.  FINDINGS: Mild hyperinflation of the lungs. Heart is borderline in size. No confluent airspace opacities or effusions.  Nonobstructive bowel gas pattern. Prior cholecystectomy. Moderate stool in the colon. No free air, organomegaly or suspicious calcification.  IMPRESSION: COPD.  No active cardiopulmonary disease.  No evidence of bowel obstruction or free air. Moderate stool burden in the colon.   Electronically Signed   By: Rolm Baptise M.D.   On: 01/18/2014 16:15    ROS negative except above Blood pressure  126/39, pulse 71, temperature 97.7 F (36.5 C), temperature source Oral, resp. rate 15, height _0  (1.6 m), weight 127.3 kg (280 lb 10.3 oz), SpO2 96.00%. Physical Exam vital signs stable afebrile no acute distress lungs are clear regular rate and rhythm abdomen is soft nontender labs reviewed  Assessment/Plan: Multiple medical problems in a patient with some black stools at home and symptomatic anemia Plan: The risks benefits methods and options of endoscopy was discussed and will proceed later today however even if she has ulcers I would probably recommend an outpatient screening colonoscopy based on her history  of breast cancer and never having a previous screening and she does not have a primary care physician here in town yet  [facility] E 01/19/2014, 10:19 AM

## 2014-01-21 ENCOUNTER — Inpatient Hospital Stay (HOSPITAL_COMMUNITY): Payer: Medicare Other

## 2014-01-21 ENCOUNTER — Encounter (HOSPITAL_COMMUNITY): Payer: Self-pay | Admitting: Gastroenterology

## 2014-01-21 DIAGNOSIS — K560 Paralytic ileus: Secondary | ICD-10-CM

## 2014-01-21 DIAGNOSIS — K929 Disease of digestive system, unspecified: Secondary | ICD-10-CM

## 2014-01-21 LAB — BASIC METABOLIC PANEL
BUN: 41 mg/dL — ABNORMAL HIGH (ref 6–23)
CALCIUM: 8.8 mg/dL (ref 8.4–10.5)
CO2: 30 mEq/L (ref 19–32)
Chloride: 101 mEq/L (ref 96–112)
Creatinine, Ser: 1.07 mg/dL (ref 0.50–1.10)
GFR calc Af Amer: 55 mL/min — ABNORMAL LOW (ref >90)
GFR, EST NON AFRICAN AMERICAN: 48 mL/min — AB (ref >90)
GLUCOSE: 131 mg/dL — AB (ref 70–99)
POTASSIUM: 5.5 meq/L — AB (ref 3.7–5.3)
Sodium: 140 mEq/L (ref 137–147)

## 2014-01-21 LAB — TYPE AND SCREEN
ABO/RH(D): O POS
Antibody Screen: NEGATIVE
Unit division: 0

## 2014-01-21 LAB — MAGNESIUM: Magnesium: 3.7 mg/dL — ABNORMAL HIGH (ref 1.5–2.5)

## 2014-01-21 LAB — GLUCOSE, CAPILLARY: Glucose-Capillary: 164 mg/dL — ABNORMAL HIGH (ref 70–99)

## 2014-01-21 LAB — CBC
HEMATOCRIT: 28.6 % — AB (ref 36.0–46.0)
Hemoglobin: 8.9 g/dL — ABNORMAL LOW (ref 12.0–15.0)
MCH: 30.2 pg (ref 26.0–34.0)
MCHC: 31.1 g/dL (ref 30.0–36.0)
MCV: 96.9 fL (ref 78.0–100.0)
PLATELETS: 369 10*3/uL (ref 150–400)
RBC: 2.95 MIL/uL — ABNORMAL LOW (ref 3.87–5.11)
RDW: 16.3 % — AB (ref 11.5–15.5)
WBC: 18.5 10*3/uL — AB (ref 4.0–10.5)

## 2014-01-21 MED ORDER — IOHEXOL 300 MG/ML  SOLN
100.0000 mL | Freq: Once | INTRAMUSCULAR | Status: AC | PRN
  Administered 2014-01-21: 100 mL via INTRAVENOUS

## 2014-01-21 MED ORDER — DILTIAZEM HCL 25 MG/5ML IV SOLN
2.5000 mg | Freq: Once | INTRAVENOUS | Status: DC
  Filled 2014-01-21: qty 5

## 2014-01-21 MED ORDER — IOHEXOL 300 MG/ML  SOLN
25.0000 mL | Freq: Once | INTRAMUSCULAR | Status: AC | PRN
  Administered 2014-01-21: 25 mL via ORAL

## 2014-01-21 NOTE — Progress Notes (Signed)
Pt has been c/o severe gas pain all morning, so we assisted her to the chair.  Pt stated she was very dizzy and was going to pass out after in the chair.  Sats were 75% on room air, (pt only wears O2 at HS at home) 3liters on now with sats of 93%, Pt feels much better and has belched quite a bit since in chair.

## 2014-01-21 NOTE — Progress Notes (Signed)
[facility] PROGRESS NOTE  Dawn Montoya [id number]:[id number] DOB: [birth year] DOA: 01/18/2014 PCP: No primary provider on file.  Assessment/Plan: #1 acute on chronic respiratory failure  -due to COPD exacerbation and bronchitis -influenza by PCR negative; patient remains afebrile. Will discontinue tamiflu and droplet precautions -BNP WNL, no signf of fluid overload on PE and also no vascular congestion or edema on CXR. -troponin neg; follow 2-D echo (grade 1 diastolic dysfunction, preserved EF and no wall motion abnormalities) -continue treatment for COPD as mentioned below (problem #2)  #2 acute COPD exacerbation  Patient had presented with upper respiratory symptoms worsening cough/shortness of breath and increased work of breathing/wheezing. -will continue IV Levaquin, oxygen supplementation, scheduled nebulizer treatments, IV Solu-Medrol and pulmicort -symptoms improving and just minimal wheezing on exam  -negative Flu by PCR  #3 melena/probable upper GI bleed  Patient with complaints of black tarry stools. Patient has been using also a lot of ibuprofen a few days prior to admission.  -Patient's hemoglobin currently at 7.3 this morning and complaining of generalized weakness.  -GI consulted, status post EGD and colonoscopy (no source of acute bleeding identified) -continue PPI by mouth now -continue following Hgb trend and transfuse if needed -if further bleeding or drop in Hgb appreciated, will need bleeding scan and/or GI capsule endoscopy.  #4 leukocytosis  -Likely reactive leukocytosis vs bronchitis vs use of steroids. -Chest x-ray is negative for acute infiltrates -continue monitoring WBC's trend -continue levaquin  #5 hypothyroidism  -TSH WNL. Continue home dose Synthroid.   #6 hypertension  -Stable to soft; especially after versed and fentanyl given for EGD/colonoscopy. -will continue holding antihypertensive agents at this moment. -follow VS  #7 anemia  -Appears  to be secondary to iron deficiency -continue protonix -status post 1 unit of PRBC, Hgb 8.9 and patient is asymptomatic currently; no overt bleeding appreciated -GI consulted for EGD and colonoscopy (no source of acute bleeding identified) -IV iron per pharmacy given as well; will benefit of PO iron at discharge  #8 prophylaxis  PPI for GI prophylaxis. SCDs for DVT prophylaxis.  #9 chronic diastolic heart failure: grade 1 and compensated. EF 65% -continue daily weight and strict intake and output  -will instruct patient to follow low sodium diet once able to tolerate PO.   #10 abd pain, distension: due to ileus  -NPO except ice chips and meds -electrolytes repletion  -follow CT scan for better evaluation and to r/o any other abnormalities -no nausea, no vomiting (will hold NGT) -no free air seen on x-ray, so doubt perforation.   Code Status: Full Family Communication: no family at bedside Disposition Plan: home when medically stable   Consultants:  GI   Procedures:  See below for x-ray reports  2-D echo pending:  - Left ventricle: The cavity size was normal. Wall thickness was normal. Systolic function was vigorous. The estimated ejection fraction was in the range of 65% to 70%. Wall motion was normal; there were no regional wall motion abnormalities. Doppler parameters are consistent with abnormal left ventricular relaxation (grade 1 diastolic dysfunction). The E/e' ratio is <10, suggesting normal LV filling pressure. - Aortic valve: Poorly visualized. There was no stenosis. - Mitral valve: Poorly visualized. No significant regurgitation. - Left atrium: The atrium was normal in size. - Right atrium: Moderately dilated (22 cm2). - Tricuspid valve: Mild regurgitation. Incomplete TR jet does not allow measurement of gradient. - Inferior vena cava: The vessel was normal in size; the respirophasic diameter changes were in the normal range (= 50%);  findings are  consistent with normal central venous pressure. - Pericardium, extracardiac: There was no pericardial Effusion.   EGD 3/29 (tiny hiatal hernia, small duodenal lipoma; no acute abnormalities to explain GIB)  Colonoscopy 3/30 (no gross lesion seen on colonoscopy to explain GIB)   Antibiotics:  levaquin   HPI/Subjective: Afebrile, complaining of abd pain and abdominal distension; reports breathing is better  Objective: Filed Vitals:   01/21/14 1414  BP: 124/42  Pulse: 65  Temp: 98 F (36.7 C)  Resp: 20    Intake/Output Summary (Last 24 hours) at 01/21/14 1626 Last data filed at 01/21/14 1500  Gross per 24 hour  Intake   1092 ml  Output    720 ml  Net    372 ml   Filed Weights   01/19/14 0500 01/20/14 0400 01/21/14 0405  Weight: 127.3 kg (280 lb 10.3 oz) 124.1 kg (273 lb 9.5 oz) 123.3 kg (271 lb 13.2 oz)    Exam:   General:  Afebrile, complaining of abd pain and abdominal distension; reports breathing is better  Cardiovascular: S1 and S2, no rubs or gallops  Respiratory: scattered rhonchi, mild positive exp wheezing; no crackles  Abdomen: diffusely tender to palpation; distended, decrease to absent BS  Musculoskeletal: trace edema bilaterally, no cyanosis  Data Reviewed: Basic Metabolic Panel:  Recent Labs Lab 01/18/14 1532 01/18/14 2100 01/19/14 0325 01/20/14 0315  NA 134*  --  137 139  K 4.1  --  4.9 4.4  CL 94*  --  103 102  CO2 26  --  25 24  GLUCOSE 128*  --  187* 209*  BUN 25*  --  19 18  CREATININE 0.88  --  0.85 0.79  CALCIUM 9.2  --  8.7 9.1  MG  --  2.2  --   --    Liver Function Tests:  Recent Labs Lab 01/18/14 1532 01/19/14 0325  AST 16 15  ALT 16 14  ALKPHOS 56 54  BILITOT 0.3 0.4  PROT 6.6 6.1  ALBUMIN 3.5 3.3*    Recent Labs Lab 01/18/14 1532  LIPASE 32   CBC:  Recent Labs Lab 01/18/14 1532 01/18/14 2100 01/19/14 0325 01/19/14 1143 01/20/14 0315 01/21/14 0335  WBC 11.9* 11.9* 10.5 9.4 12.8* 18.5*   NEUTROABS 9.3*  --   --   --   --   --   HGB 8.3* 9.1* 7.9* 8.0* 7.3* 8.9*  HCT 25.0* 29.9* 24.0* 24.5* 23.2* 28.6*  MCV 93.6 94.0 93.8 94.2 96.3 96.9  PLT 379 308 313 329 348 369   Cardiac Enzymes:  Recent Labs Lab 01/18/14 1532 01/18/14 2100 01/19/14 0325 01/19/14 0910  TROPONINI <0.30 <0.30 <0.30 <0.30   BNP (last 3 results)  Recent Labs  01/18/14 1532 01/19/14 0325  PROBNP 484.2* 423.1    Recent Results (from the past 240 hour(s))  URINE CULTURE     Status: None   Collection Time    01/18/14  7:20 PM      Result Value Ref Range Status   Specimen Description URINE, CLEAN CATCH   Final   Special Requests NONE   Final   Culture  Setup Time     Final   Value: 01/19/2014 01:39     Performed at [facility]     Final   Value: 75,000 COLONIES/ML     Performed at [facility]   Culture     Final   Value: Multiple bacterial morphotypes present,  none predominant. Suggest appropriate recollection if clinically indicated.     Performed at [facility]   Report Status 01/20/2014 FINAL   Final  MRSA PCR SCREENING     Status: None   Collection Time    01/18/14  8:27 PM      Result Value Ref Range Status   MRSA by PCR NEGATIVE  NEGATIVE Final   Comment:            The GeneXpert MRSA Assay (FDA     approved for NASAL specimens     only), is one component of a     comprehensive MRSA colonization     surveillance program. It is not     intended to diagnose MRSA     infection nor to guide or     monitor treatment for     MRSA infections.     Studies: Dg Abd 2 Views  01/21/2014   CLINICAL DATA:  Abdominal pain and distention following colonoscopy  EXAM: ABDOMEN - 2 VIEW  COMPARISON:  DG ABD ACUTE W/CHEST dated 01/18/2014  FINDINGS: There is considerable gas and fluid within small and large bowel loops throughout the abdomen. The pattern is consistent with an ileus. There is small amount of gas in the region of the rectum. As best as  can be determined the stomach is not abnormally distended. There are no free extraluminal gas collections demonstrated. The bony structures exhibit no acute abnormalities.  IMPRESSION: There is a fairly large volume of gas with some fluid within loops of mildly distended small and large bowel. The pattern is consistent with an ileus. The etiology for this is not clear. The degree of colonic distention reaches at least 11 cm in greatest transverse dimension. It may be useful to consider the patient for abdominal and pelvic CT scanning.  These results were called by telephone at the time of interpretation on 01/21/2014 at 2:48 PM to Raye Sorrow, RN, who verbally acknowledged these results.   Electronically Signed   By: David  Martinique   On: 01/21/2014 14:45    Scheduled Meds: . budesonide (PULMICORT) nebulizer solution  0.25 mg Nebulization BID  . dextromethorphan-guaiFENesin  2 tablet Oral BID  . ipratropium-albuterol  3 mL Nebulization Q6H WA  . levofloxacin (LEVAQUIN) IV  500 mg Intravenous Q24H  . levothyroxine  75 mcg Oral QAC breakfast  . loratadine  10 mg Oral Daily  . methylPREDNISolone (SOLU-MEDROL) injection  80 mg Intravenous 3 times per day  . pantoprazole  40 mg Oral Daily  . pneumococcal 23 valent vaccine  0.5 mL Intramuscular Tomorrow-1000  . polyethylene glycol  17 g Oral TID  . sodium chloride  1,000 mL Intravenous Once  . sodium chloride  500 mL Intravenous Once  . sodium chloride  3 mL Intravenous Q12H   Continuous Infusions: . sodium chloride    . sodium chloride 20 mL/hr at 01/20/14 1825    Principal Problem:   Acute on chronic respiratory failure Active Problems:   COPD (chronic obstructive pulmonary disease)   COPD with acute exacerbation   Anemia   Melena   GIB (gastrointestinal bleeding)   Leukocytosis   HTN (hypertension)   Hypothyroidism    Time spent: >30 minutes    Barton Dubois  [facility] Pager [phone]. If 7PM-7AM, please contact  night-coverage at [email or web address], password [facility] 01/21/2014, 4:26 PM  LOS: 3 days

## 2014-01-21 NOTE — Progress Notes (Addendum)
   CARE MANAGEMENT NOTE 01/21/2014  Patient:  KIVA, NORLAND   Account Number:  [id number]  Date Initiated:  01/21/2014  Documentation initiated by:  Gabriel Earing  Subjective/Objective Assessment:   Pt admitted with cco SOB     Action/Plan:   from home going home with daughter and granddaughter   Anticipated DC Date:  01/23/2014   Anticipated DC Plan:  [place]  CM consult               Status of service:  In process, will continue to follow Medicare Important Message given?  NO (If response is "NO", the following Medicare IM given date fields will be blank) Date Medicare IM given:   Date Additional Medicare IM given:    Discharge Disposition:    Per UR Regulation:  Reviewed for med. necessity/level of care/duration of stay  If discussed at [place] of Stay Meetings, dates discussed:    Comments:  01/21/14 MMCGIBBONEY, RN, BSN Spoke with pt concerning discharge plans. Pt referred me to her granddaughter who was on the telephone concerning discharge plans. Granddaughter work from home and is with pt 24/7. Both pt and granddaughter state there is no need for HHPT/HHRN. However the pt request a RW.

## 2014-01-21 NOTE — Progress Notes (Addendum)
Received orders from MD. Before giving medication, glanced at monitor one more time. Showed NSR around 0320. EKG done to confirm NSR. MD aware. Will continue to monitor closely. Pt resting comfortably at this time. Blanchard Kelch, RN

## 2014-01-21 NOTE — Progress Notes (Signed)
[facility] PROGRESS NOTE Subjective Pt has passed very little air since colonoscopy yesterday, with increased belching, state abd is distended. Acute  abd series shows diffuse dilation of colon and small bowel, no free air. CT is pending.  Objective: Vital signs in last 24 hours: Temp:  [98 F (36.7 C)-98.2 F (36.8 C)] 98 F (36.7 C) 2023-01-31 1414) Pulse Rate:  [65-94] 65 01-31-2023 1414) Resp:  [18-22] 20 01-31-2023 1414) BP: (123-142)/(42-60) 124/42 mmHg 01-31-23 1414) SpO2:  [75 %-95 %] 95 % 31-Jan-2023 1423) Weight:  [123.3 kg (271 lb 13.2 oz)] 123.3 kg (271 lb 13.2 oz) 01-31-2023 0405) Last BM Date: 01/19/14  Intake/Output from previous day: 03/30 0701 - 01/31/23 0700 In: 372 [P.O.:120; I.V.:252] Out: 470 [Urine:450; Emesis/NG output:20] Intake/Output this shift: Total I/O In: 720 [P.O.:360; I.V.:160; IV Piggyback:200] Out: 550 [Urine:550]  PE: General--no acute distress  Abdomen--distended and quiet  Lab Results:  Recent Labs  01/18/14 2100 01/19/14 0325 01/19/14 1143 01/20/14 0315 2014-01-30 0335  WBC 11.9* 10.5 9.4 12.8* 18.5*  HGB 9.1* 7.9* 8.0* 7.3* 8.9*  HCT 29.9* 24.0* 24.5* 23.2* 28.6*  PLT 308 313 329 348 369   BMET  Recent Labs  01/19/14 0325 01/20/14 0315  NA 137 139  K 4.9 4.4  CL 103 102  CO2 25 24  CREATININE 0.85 0.79   LFT  Recent Labs  01/19/14 0325  PROT 6.1  AST 15  ALT 14  ALKPHOS 54  BILITOT 0.4   PT/INR  Recent Labs  01/19/14 0325  LABPROT 13.6  INR 1.06   PANCREAS No results found for this basename: LIPASE,  in the last 72 hours       Studies/Results: Dg Abd 2 Views  01/30/2014   CLINICAL DATA:  Abdominal pain and distention following colonoscopy  EXAM: ABDOMEN - 2 VIEW  COMPARISON:  DG ABD ACUTE W/CHEST dated 01/18/2014  FINDINGS: There is considerable gas and fluid within small and large bowel loops throughout the abdomen. The pattern is consistent with an ileus. There is small amount of gas in the region of the  rectum. As best as can be determined the stomach is not abnormally distended. There are no free extraluminal gas collections demonstrated. The bony structures exhibit no acute abnormalities.  IMPRESSION: There is a fairly large volume of gas with some fluid within loops of mildly distended small and large bowel. The pattern is consistent with an ileus. The etiology for this is not clear. The degree of colonic distention reaches at least 11 cm in greatest transverse dimension. It may be useful to consider the patient for abdominal and pelvic CT scanning.  These results were called by telephone at the time of interpretation on 01-30-14 at 2:48 PM to Raye Sorrow, RN, who verbally acknowledged these results.   Electronically Signed   By: David  Martinique   On: 01-30-2014 14:45    Medications: I have reviewed the patient's current medications.  Assessment/Plan: 1. Marked abdominal distension. Films suggest SB and colonic ileus. No clear perforation but CT pending. Will check lytes and MG   Mychal Decarlo JR,Kehinde Bowdish L 01/30/14, 4:13 PM

## 2014-01-21 NOTE — Progress Notes (Signed)
Called by [employer]. Notified that pt had converted to A.Fib around 0144. Strip printed and put in chart. MD notified. HR maintaining around 100s-110s. Patient is resting comfortably at this time. Will continue to monitor. Blanchard Kelch, RN

## 2014-01-22 ENCOUNTER — Inpatient Hospital Stay (HOSPITAL_COMMUNITY): Payer: Medicare Other

## 2014-01-22 LAB — CBC
HEMATOCRIT: 28.9 % — AB (ref 36.0–46.0)
Hemoglobin: 8.9 g/dL — ABNORMAL LOW (ref 12.0–15.0)
MCH: 30.1 pg (ref 26.0–34.0)
MCHC: 30.8 g/dL (ref 30.0–36.0)
MCV: 97.6 fL (ref 78.0–100.0)
PLATELETS: 373 10*3/uL (ref 150–400)
RBC: 2.96 MIL/uL — ABNORMAL LOW (ref 3.87–5.11)
RDW: 16.1 % — ABNORMAL HIGH (ref 11.5–15.5)
WBC: 14.5 10*3/uL — ABNORMAL HIGH (ref 4.0–10.5)

## 2014-01-22 LAB — GLUCOSE, CAPILLARY: Glucose-Capillary: 192 mg/dL — ABNORMAL HIGH (ref 70–99)

## 2014-01-22 MED ORDER — IPRATROPIUM-ALBUTEROL 0.5-2.5 (3) MG/3ML IN SOLN
3.0000 mL | RESPIRATORY_TRACT | Status: DC
  Administered 2014-01-22 – 2014-01-24 (×12): 3 mL via RESPIRATORY_TRACT
  Filled 2014-01-22 (×12): qty 3

## 2014-01-22 MED ORDER — METHYLPREDNISOLONE SODIUM SUCC 125 MG IJ SOLR
80.0000 mg | Freq: Four times a day (QID) | INTRAMUSCULAR | Status: DC
  Administered 2014-01-22 – 2014-01-23 (×4): 80 mg via INTRAVENOUS
  Filled 2014-01-22 (×8): qty 1.28

## 2014-01-22 MED ORDER — BUSPIRONE HCL 5 MG PO TABS
5.0000 mg | ORAL_TABLET | Freq: Two times a day (BID) | ORAL | Status: DC
  Administered 2014-01-22 – 2014-01-23 (×3): 5 mg via ORAL
  Filled 2014-01-22 (×4): qty 1

## 2014-01-22 MED ORDER — LEVOFLOXACIN 500 MG PO TABS
500.0000 mg | ORAL_TABLET | Freq: Every day | ORAL | Status: DC
  Administered 2014-01-23 – 2014-01-27 (×5): 500 mg via ORAL
  Filled 2014-01-22 (×5): qty 1

## 2014-01-22 NOTE — Progress Notes (Signed)
Note: This document was prepared with digital dictation and possible smart phrase technology. Any transcriptional errors that result from this process are unintentional.   Dawn Montoya WRU:[id number] DOB: 1932-08-04 DOA: 01/18/2014 PCP: No primary provider on file.  Brief narrative: 80 y/o ?, known prior history COPD, Htn, hypothyroid, Breast Ca s/p Mastectomy admitted to the hospital 3/28 with a 3-4 day history shortness breath wheezing dizziness and orthostasis, in the setting of black tarry stools (on chronic NSAIDs for arthritis) Emergency room workup = normal Troponins negative [facility] before 84 Hemoglobin 8.3 INR 0.90 Admitted and worked up for acute on chronic history failure-thought to be secondary to COPD exacerbation with negative flu test, the normal, echocardiogram grade 1 diastolic dysfunction-received one unit pack red blood cells 01/20/14  Past medical history-As per Problem list Chart reviewed as below- Reviewed  Consultants:  Gastroenterology  Procedures:  Upper endoscopy 3/28  Colonoscopy 3/30  Antibiotics:  Levaquin 3/28-->  Tamiflu 3/28-3/29   Subjective  Acutely short of breath this morning Wheezing more. Reviewed later in the day and did better NO flatus, no stool, belching a lot  Uses O2 at home chronically on and off   Objective    Interim History: none  Telemetry: Afib alt c sinus   Objective: Filed Vitals:   01/22/14 0841 01/22/14 0900 01/22/14 0905 01/22/14 0910  BP:  96/80 113/54 135/66  Pulse:  74 86 95  Temp:      TempSrc:      Resp:      Height:      Weight:      SpO2: 90%       Intake/Output Summary (Last 24 hours) at 01/22/14 0920 Last data filed at 01/22/14 0758  Gross per 24 hour  Intake    840 ml  Output   1225 ml  Net   -385 ml    Exam:  General: EOMI, ncat Cardiovascular:  s1 s2 no m/r/g, appears in rrr Respiratory: clear, no added sound Abdomen: distended upper abdomen, firm, no tenderness, no  rebound or gaurding Skinno le edema Neuro intact but a little confused  Data Reviewed: Basic Metabolic Panel:  Recent Labs Lab 01/18/14 1532 01/18/14 2100 01/19/14 0325 01/20/14 0315 01/21/14 1655  NA 134*  --  137 139 140  K 4.1  --  4.9 4.4 5.5*  CL 94*  --  103 102 101  CO2 26  --  25 24 30   GLUCOSE 128*  --  187* 209* 131*  BUN 25*  --  19 18 41*  CREATININE 0.88  --  0.85 0.79 1.07  CALCIUM 9.2  --  8.7 9.1 8.8  MG  --  2.2  --   --  3.7*   Liver Function Tests:  Recent Labs Lab 01/18/14 1532 01/19/14 0325  AST 16 15  ALT 16 14  ALKPHOS 56 54  BILITOT 0.3 0.4  PROT 6.6 6.1  ALBUMIN 3.5 3.3*    Recent Labs Lab 01/18/14 1532  LIPASE 32   No results found for this basename: AMMONIA,  in the last 168 hours CBC:  Recent Labs Lab 01/18/14 1532  01/19/14 0325 01/19/14 1143 01/20/14 0315 01/21/14 0335 01/22/14 0330  WBC 11.9*  < > 10.5 9.4 12.8* 18.5* 14.5*  NEUTROABS 9.3*  --   --   --   --   --   --   HGB 8.3*  < > 7.9* 8.0* 7.3* 8.9* 8.9*  HCT 25.0*  < > 24.0* 24.5* 23.2*  28.6* 28.9*  MCV 93.6  < > 93.8 94.2 96.3 96.9 97.6  PLT 379  < > 313 329 348 369 373  < > = values in this interval not displayed. Cardiac Enzymes:  Recent Labs Lab 01/18/14 1532 01/18/14 2100 01/19/14 0325 01/19/14 0910  TROPONINI <0.30 <0.30 <0.30 <0.30   BNP: No components found with this basename: POCBNP,  CBG:  Recent Labs Lab 01/19/14 0832 01/20/14 0753 01/21/14 0737 01/22/14 0724  GLUCAP 178* 186* 164* 192*    Recent Results (from the past 240 hour(s))  URINE CULTURE     Status: None   Collection Time    01/18/14  7:20 PM      Result Value Ref Range Status   Specimen Description URINE, CLEAN CATCH   Final   Special Requests NONE   Final   Culture  Setup Time     Final   Value: 01/19/2014 01:39     Performed at [facility]     Final   Value: 75,000 COLONIES/ML     Performed at [facility]   Culture     Final    Value: Multiple bacterial morphotypes present, none predominant. Suggest appropriate recollection if clinically indicated.     Performed at [facility]   Report Status 01/20/2014 FINAL   Final  MRSA PCR SCREENING     Status: None   Collection Time    01/18/14  8:27 PM      Result Value Ref Range Status   MRSA by PCR NEGATIVE  NEGATIVE Final   Comment:            The GeneXpert MRSA Assay (FDA     approved for NASAL specimens     only), is one component of a     comprehensive MRSA colonization     surveillance program. It is not     intended to diagnose MRSA     infection nor to guide or     monitor treatment for     MRSA infections.     Studies:              All Imaging reviewed and is as per above notation   Scheduled Meds: . budesonide (PULMICORT) nebulizer solution  0.25 mg Nebulization BID  . dextromethorphan-guaiFENesin  2 tablet Oral BID  . ipratropium-albuterol  3 mL Nebulization Q6H WA  . levofloxacin (LEVAQUIN) IV  500 mg Intravenous Q24H  . levothyroxine  75 mcg Oral QAC breakfast  . loratadine  10 mg Oral Daily  . methylPREDNISolone (SOLU-MEDROL) injection  80 mg Intravenous 3 times per day  . pantoprazole  40 mg Oral Daily  . pneumococcal 23 valent vaccine  0.5 mL Intramuscular Tomorrow-1000  . polyethylene glycol  17 g Oral TID  . sodium chloride  1,000 mL Intravenous Once  . sodium chloride  500 mL Intravenous Once  . sodium chloride  3 mL Intravenous Q12H   Continuous Infusions: . sodium chloride    . sodium chloride 75 mL/hr at 01/22/14 7628     Assessment/Plan: A/chronic respiratory failure-2/2 to  Stage "c" to "d" O2 dependant COPD-Slighlty worsened ?increased nebs on 4/1.  Levaquin  to PO.  MOnitor.  Will need home O2 on d/c home Melena + UGIB-undetermined site-probable Upper GI-both colonoscopy/endoscopy neg-consider Capsule study as per GI. Ileus-replace mag/Phos-Ct scan doesn't show SBO-consider NG tube if still distended in am.  Ambulate,  advised to chew gyum to help peristalsis-d/c IV  morphine Reactive leukocytosis Hypothyroidism-continue levothyroxine 75 mcg daily Iron def anemia-s/p I U PRBC.  Hb stable 8 range.  Monitor. Impaired glucose tolerance- monitor with SSI coverage   Code Status: Full Family Communication:  NOne bedside Disposition Plan: inpatient   Verneita Griffes, MD  [facility] Pager [phone] 01/22/2014, 9:20 AM    LOS: 4 days

## 2014-01-22 NOTE — Progress Notes (Signed)
Hassan Buckler 3:50 PM  Subjective: Patient doing better this afternoon but still not passing gas from below and tolerating clear liquid and not breathing well  Objective: Vital signs stable except increased respiratory rate abdomen is slightly distended proximally soft nontender lower occasional bowel sounds CT reviewed labs stable  Assessment: Probable colonic ileus  Plan: Believe she will do better with some MiraLax and agree with an enema tomorrow and consider a flexible sigmoidoscopy with decompression if needed and I discussed that with the patient and my partner Dr. Oletta Lamas [facility]

## 2014-01-22 NOTE — Progress Notes (Signed)
Pt became very SOB while moving in bed this am using the bedpan. Lung sounds- audible wheezing, O2 sats dropped to 73%. Repositioned in high [place]. Called respiratory for a PRN breathing treatment. Breathing treatment was done, pt breathing slowed and O2 sats came back to 96-98% on 3L. Patient resting at this time. Pt verbalizes, "My breathing is just not good this morning." Verbalizes some relief at this time. Will continue to monitor closely. Blanchard Kelch, RN

## 2014-01-23 LAB — GLUCOSE, CAPILLARY: GLUCOSE-CAPILLARY: 162 mg/dL — AB (ref 70–99)

## 2014-01-23 LAB — COMPREHENSIVE METABOLIC PANEL
ALT: 25 U/L (ref 0–35)
AST: 22 U/L (ref 0–37)
Albumin: 3.4 g/dL — ABNORMAL LOW (ref 3.5–5.2)
Alkaline Phosphatase: 53 U/L (ref 39–117)
BUN: 34 mg/dL — AB (ref 6–23)
CALCIUM: 8.2 mg/dL — AB (ref 8.4–10.5)
CO2: 27 mEq/L (ref 19–32)
Chloride: 101 mEq/L (ref 96–112)
Creatinine, Ser: 0.88 mg/dL (ref 0.50–1.10)
GFR, EST AFRICAN AMERICAN: 70 mL/min — AB (ref >90)
GFR, EST NON AFRICAN AMERICAN: 60 mL/min — AB (ref >90)
GLUCOSE: 197 mg/dL — AB (ref 70–99)
Potassium: 5.6 mEq/L — ABNORMAL HIGH (ref 3.7–5.3)
SODIUM: 137 meq/L (ref 137–147)
Total Bilirubin: 0.5 mg/dL (ref 0.3–1.2)
Total Protein: 5.9 g/dL — ABNORMAL LOW (ref 6.0–8.3)

## 2014-01-23 LAB — CBC
HEMATOCRIT: 28.8 % — AB (ref 36.0–46.0)
HEMOGLOBIN: 8.9 g/dL — AB (ref 12.0–15.0)
MCH: 30.2 pg (ref 26.0–34.0)
MCHC: 30.9 g/dL (ref 30.0–36.0)
MCV: 97.6 fL (ref 78.0–100.0)
Platelets: 342 10*3/uL (ref 150–400)
RBC: 2.95 MIL/uL — ABNORMAL LOW (ref 3.87–5.11)
RDW: 16.2 % — ABNORMAL HIGH (ref 11.5–15.5)
WBC: 12.6 10*3/uL — ABNORMAL HIGH (ref 4.0–10.5)

## 2014-01-23 LAB — PROTIME-INR
INR: 1.09 (ref 0.00–1.49)
PROTHROMBIN TIME: 13.9 s (ref 11.6–15.2)

## 2014-01-23 LAB — POTASSIUM: Potassium: 5.9 mEq/L — ABNORMAL HIGH (ref 3.7–5.3)

## 2014-01-23 MED ORDER — METHYLPREDNISOLONE SODIUM SUCC 125 MG IJ SOLR
80.0000 mg | Freq: Three times a day (TID) | INTRAMUSCULAR | Status: DC
  Administered 2014-01-23 – 2014-01-24 (×3): 80 mg via INTRAVENOUS
  Filled 2014-01-23 (×6): qty 1.28

## 2014-01-23 MED ORDER — BUSPIRONE HCL 5 MG PO TABS
5.0000 mg | ORAL_TABLET | Freq: Three times a day (TID) | ORAL | Status: DC
  Administered 2014-01-23 – 2014-01-27 (×12): 5 mg via ORAL
  Filled 2014-01-23 (×14): qty 1

## 2014-01-23 NOTE — Progress Notes (Signed)
Inpatient Diabetes Program Recommendations  AACE/ADA: New Consensus Statement on Inpatient Glycemic Control (2013)  Target Ranges:  Prepandial:   less than 140 mg/dL      Peak postprandial:   less than 180 mg/dL (1-2 hours)      Critically ill patients:  140 - 180 mg/dL   Reason for Visit: Hyperglycemia  Results for Dawn Montoya, Dawn Montoya (MRN [id number]) as of 01/23/2014 09:04  Ref. Range 01/19/2014 08:32 01/20/2014 07:53 01/21/2014 07:37 01/22/2014 07:24 01/23/2014 07:25  Glucose-Capillary Latest Range: 70-99 mg/dL 178 (H) 186 (H) 164 (H) 192 (H) 162 (H)   Consider adding Novolog sensitive Q4H and as diet is advanced - tidwc.  Thank you. Lorenda Peck, RD, LDN, CDE Inpatient Diabetes Coordinator [phone]

## 2014-01-23 NOTE — Progress Notes (Signed)
Note: This document was prepared with digital dictation and possible smart phrase technology. Any transcriptional errors that result from this process are unintentional.   Song Myre NLZ:[id number] DOB: [birth year] DOA: 01/18/2014 PCP: No primary provider on file.  Brief narrative: 80 y/o ?, known prior history COPD, Htn, hypothyroid, Breast Ca s/p Mastectomy admitted to the hospital 3/28 with a 3-4 day history shortness breath wheezing dizziness and orthostasis, in the setting of black tarry stools (on chronic NSAIDs for arthritis) Emergency room workup = normal Troponins negative [facility] before 84 Hemoglobin 8.3 INR 0.90 Admitted and worked up for acute on chronic respiratory failure-thought to be secondary to COPD exacerbation with negative flu test, the normal, echocardiogram grade 1 diastolic dysfunction-received one unit pack red blood cells 01/20/14  Past medical history-As per Problem list Chart reviewed as below- Reviewed  Consultants:  Gastroenterology  Procedures:  Upper endoscopy 3/28  Colonoscopy 3/30  Antibiotics:  Levaquin 3/28-->  Tamiflu 3/28-3/29   Subjective  Doing well. Eating better Small stool yesterday Ambulated with therapy this morning No nausea vomiting No chest pain Shortness of breath seems fair Granddaughter asks about antianxiety medications     Objective    Interim History: none  Telemetry: Afib alt c sinus   Objective: Filed Vitals:   01/23/14 0544 01/23/14 0546 01/23/14 0557 01/23/14 0809  BP: 141/64 134/44    Pulse: 80 83    Temp:      TempSrc:      Resp:      Height:      Weight:   122.9 kg (270 lb 15.1 oz)   SpO2: 98% 100%  96%    Intake/Output Summary (Last 24 hours) at 01/23/14 1304 Last data filed at 01/23/14 0556  Gross per 24 hour  Intake    120 ml  Output    700 ml  Net   -580 ml    Exam:  General: EOMI, ncat Cardiovascular:  s1 s2 no m/r/g, appears in rrr Respiratory: clear, no added  sound Abdomen: distended upper abdomen,less firmthan prior exam, no tenderness, no rebound or gaurding Skinno le edema Neuro intact but a little confused  Data Reviewed: Basic Metabolic Panel:  Recent Labs Lab 01/18/14 1532 01/18/14 2100 01/19/14 0325 01/20/14 0315 01/21/14 1655 01/23/14 0355  NA 134*  --  137 139 140 137  K 4.1  --  4.9 4.4 5.5* 5.6*  CL 94*  --  103 102 101 101  CO2 26  --  25 24 30 27   GLUCOSE 128*  --  187* 209* 131* 197*  BUN 25*  --  19 18 41* 34*  CREATININE 0.88  --  0.85 0.79 1.07 0.88  CALCIUM 9.2  --  8.7 9.1 8.8 8.2*  MG  --  2.2  --   --  3.7*  --    Liver Function Tests:  Recent Labs Lab 01/18/14 1532 01/19/14 0325 01/23/14 0355  AST 16 15 22   ALT 16 14 25   ALKPHOS 56 54 53  BILITOT 0.3 0.4 0.5  PROT 6.6 6.1 5.9*  ALBUMIN 3.5 3.3* 3.4*    Recent Labs Lab 01/18/14 1532  LIPASE 32   No results found for this basename: AMMONIA,  in the last 168 hours CBC:  Recent Labs Lab 01/18/14 1532  01/19/14 1143 01/20/14 0315 01/21/14 0335 01/22/14 0330 01/23/14 0355  WBC 11.9*  < > 9.4 12.8* 18.5* 14.5* 12.6*  NEUTROABS 9.3*  --   --   --   --   --   --  HGB 8.3*  < > 8.0* 7.3* 8.9* 8.9* 8.9*  HCT 25.0*  < > 24.5* 23.2* 28.6* 28.9* 28.8*  MCV 93.6  < > 94.2 96.3 96.9 97.6 97.6  PLT 379  < > 329 348 369 373 342  < > = values in this interval not displayed. Cardiac Enzymes:  Recent Labs Lab 01/18/14 1532 01/18/14 2100 01/19/14 0325 01/19/14 0910  TROPONINI <0.30 <0.30 <0.30 <0.30   BNP: No components found with this basename: POCBNP,  CBG:  Recent Labs Lab 01/19/14 0832 01/20/14 0753 01/21/14 0737 01/22/14 0724 01/23/14 0725  GLUCAP 178* 186* 164* 192* 162*    Recent Results (from the past 240 hour(s))  URINE CULTURE     Status: None   Collection Time    01/18/14  7:20 PM      Result Value Ref Range Status   Specimen Description URINE, CLEAN CATCH   Final   Special Requests NONE   Final   Culture  Setup  Time     Final   Value: 01/19/2014 01:39     Performed at [facility]     Final   Value: 75,000 COLONIES/ML     Performed at [facility]   Culture     Final   Value: Multiple bacterial morphotypes present, none predominant. Suggest appropriate recollection if clinically indicated.     Performed at [facility]   Report Status 01/20/2014 FINAL   Final  MRSA PCR SCREENING     Status: None   Collection Time    01/18/14  8:27 PM      Result Value Ref Range Status   MRSA by PCR NEGATIVE  NEGATIVE Final   Comment:            The GeneXpert MRSA Assay (FDA     approved for NASAL specimens     only), is one component of a     comprehensive MRSA colonization     surveillance program. It is not     intended to diagnose MRSA     infection nor to guide or     monitor treatment for     MRSA infections.     Studies:              All Imaging reviewed and is as per above notation   Scheduled Meds: . budesonide (PULMICORT) nebulizer solution  0.25 mg Nebulization BID  . busPIRone  5 mg Oral BID  . dextromethorphan-guaiFENesin  2 tablet Oral BID  . ipratropium-albuterol  3 mL Nebulization Q4H  . levofloxacin  500 mg Oral Daily  . levothyroxine  75 mcg Oral QAC breakfast  . loratadine  10 mg Oral Daily  . methylPREDNISolone (SOLU-MEDROL) injection  80 mg Intravenous Q8H  . pantoprazole  40 mg Oral Daily  . pneumococcal 23 valent vaccine  0.5 mL Intramuscular Tomorrow-1000  . polyethylene glycol  17 g Oral TID  . sodium chloride  1,000 mL Intravenous Once  . sodium chloride  500 mL Intravenous Once  . sodium chloride  3 mL Intravenous Q12H   Continuous Infusions: . sodium chloride    . sodium chloride 75 mL/hr at 01/22/14 8299     Assessment/Plan: A/chronic respiratory failure-2/2 to  Stage "c" to "d" O2 dependant COPD-Slighlty worsened ?increased nebs on 4/1.  Levaquin  to PO.  MOnitor.  Will need home O2 on d/c home-she has a significant  anxiety component to her COPD Anxiety-increase BuSpar to 5  mg 3 times a day Melena + UGIB-undetermined site-probable Upper GI-both colonoscopy/endoscopy neg-hemoglobin is very stable at present therefore we will defer management as an inpatient and patient should followup with [facility] as an outpatient Ileus-replace mag/Phos-Ct scan doesn't show SBO-continue ambulation. Resolving has passed stool yesterday-d/c IV morphine Reactive leukocytosis Hypothyroidism-continue levothyroxine 75 mcg daily Iron def anemia-s/p I U PRBC.  Hb stable 8 range. Stable currently Impaired glucose tolerance- monitor with SSI coverage-blood sugars 160   Code Status: Full Family Communication: discussed with daughter and granddaughter at bedside 4/2 Disposition Plan: inpatient   Verneita Griffes, MD  [facility] Pager [phone] 01/23/2014, 1:04 PM    LOS: 5 days

## 2014-01-23 NOTE — Evaluation (Signed)
Physical Therapy Evaluation Patient Details Name: Dawn Montoya MRN: [id number] DOB: May 27, 1932 Today's Date: 01/23/2014   History of Present Illness  Pt is a 80 year old female admitted 3/28 with a 3-4 day history shortness breath wheezing dizziness and orthostasis, setting of black tarry stools; PMH of COPD, HTN, hypothyroid, breast cancer s/p mastectomy  Clinical Impression  Pt admitted with acute on chronic respiratory failure presenting with SOB, wheezing, dizziness, and black tarry stools.  During transfers pt asked for assistance but was able to perform transfers min guard.  During ambulation with rolling walker, SpO2 dropped to 82% with 6L oxygen.  Repeatedly cued to breathe in through nose, pt persisted to breathe in through mouth.  Pt is likely close to baseline however, pt will benefit from skilled PT to increase their independence and safety with mobility to allow discharge.    Follow Up Recommendations No PT follow up    Equipment Recommendations  None recommended by PT    Recommendations for Other Services       Precautions / Restrictions Precautions Precautions: Fall Precaution Comments: monitor sats Restrictions Weight Bearing Restrictions: No      Mobility  Bed Mobility Overal bed mobility: Needs Assistance Bed Mobility: Supine to Sit     Supine to sit: Min assist     General bed mobility comments: verbal cues for safety; required hand held assist from therapist to bring trunk into sitting, pt had dizziness upon sitting which resolved with deep breathes of oxygen, SpO2 at rest on room air 86%, [place] reapplied  Transfers Overall transfer level: Needs assistance Equipment used: Rolling walker (2 wheeled) Transfers: Sit to/from Stand Sit to Stand: Min guard         General transfer comment: verbal cues for safety and positioning of walker closer to bed  Ambulation/Gait Ambulation/Gait assistance: Supervision Ambulation Distance (Feet): 80 Feet Assistive  device: Rolling walker (2 wheeled) Gait Pattern/deviations: WFL(Within Functional Limits)     General Gait Details: verbal cues for safety; pt ambulated on 6 L O2, SpO2 dropped to 82% even with supplemental oxygen; frequently cued to breathe through in nose and out mouth, persisted to breathe in through mouth  Stairs            Wheelchair Mobility    Modified Rankin (Stroke Patients Only)       Balance                                             Pertinent Vitals/Pain SpO2 at rest on room air: 86% ([place] reapplied prior to ambulation) SpO2 ambulating on 6L O2: 82% SpO2 at rest on 6L O2: 95% Activity to tolerance.    Home Living Family/patient expects to be discharged to:: Private residence Living Arrangements: Children (recently moved to [place] to live with daughter and grandaughter) Available Help at Discharge: Family Type of Home: House Home Access: Stairs to enter   Technical brewer of Steps: 4 (reports daughter helps her ascend stairs into house) Home Layout: One level Home Equipment: Environmental consultant - 2 wheels      Prior Function Level of Independence: Needs assistance               Hand Dominance        Extremity/Trunk Assessment   Upper Extremity Assessment: Overall WFL for tasks assessed           Lower Extremity  Assessment: Overall WFL for tasks assessed      Cervical / Trunk Assessment: Normal  Communication   Communication: HOH (deaf in L ear)  Cognition Arousal/Alertness: Awake/alert Behavior During Therapy: WFL for tasks assessed/performed (pt very talkative required frequent redirection to task) Overall Cognitive Status: Within Functional Limits for tasks assessed                      General Comments      Exercises        Assessment/Plan    PT Assessment Patient needs continued PT services  PT Diagnosis Difficulty walking   PT Problem List Decreased activity tolerance;Decreased knowledge of use of  DME;Cardiopulmonary status limiting activity;Decreased mobility  PT Treatment Interventions DME instruction;Gait training;Stair training;Functional mobility training;Therapeutic exercise;Therapeutic activities;Patient/family education   PT Goals (Current goals can be found in the Care Plan section) Acute Rehab PT Goals Patient Stated Goal: return home with daughter PT Goal Formulation: With patient Time For Goal Achievement: 01/30/14 Potential to Achieve Goals: Good    Frequency Min 3X/week   Barriers to discharge        Co-evaluation               End of Session Equipment Utilized During Treatment: Gait belt;Oxygen (6L oxygen) Activity Tolerance: Patient tolerated treatment well Patient left: in chair;with call bell/phone within reach Nurse Communication: Other (comment) (pt left with bedpan, pt reports not receiving breakfast, asked nurse to ensure pt receives lunch)         Time: 6314-9702 PT Time Calculation (min): 20 min   Charges:   PT Evaluation $Initial PT Evaluation Tier I: 1 Procedure PT Treatments $Gait Training: 8-22 mins   PT G CodesJacqulyn Cane 01/23/2014, 11:52 AM Jacqulyn Cane SPT 01/23/2014

## 2014-01-23 NOTE — Progress Notes (Signed)
Pt has been passing gas since last night. This am pt had bowel movement- medium loose, soft. Pt states that she feels better. Passed along information to day nurse. Will continue to monitor. Blanchard Kelch, RN

## 2014-01-23 NOTE — Progress Notes (Signed)
Hassan Buckler 12:34 PM  Subjective: Patient doing much better today from a GI standpoint both passing gas and having a bowel movement and no new complaint  Objective: Vital signs stable afebrile no acute distress abdomen is nontender however exam unchanged rare bowel sounds white count and BUN improved  Assessment: Improve colonic ileus  Plan: Will keep on clear liquids one more day and probably repeat x-ray in a.m. and if she does not require decompression probably advance diet. I offered her to advance diet today but she prefers to wait till tomorrow  [facility] E

## 2014-01-23 NOTE — Progress Notes (Signed)
Patient evaluated for [place] Management services. Needed to verify patient's PCP. Came to bedside and patient states she is moving from [place] and does not have a PCP as of yet. States her grandaughter is going to help her find one. Therefore, patient not eligible for [facility] Care Management at this time. Declined needing assist with finding a PCP. Will make inpatient RN case manager aware. Marthenia Rolling, MSN-RN,BSN- [facility] [phone]

## 2014-01-23 NOTE — Evaluation (Signed)
I have reviewed this note and agree with all findings. Kati Mikella Linsley, PT, DPT Pager: [phone]   

## 2014-01-24 LAB — CBC
HEMATOCRIT: 31.7 % — AB (ref 36.0–46.0)
Hemoglobin: 9.9 g/dL — ABNORMAL LOW (ref 12.0–15.0)
MCH: 30.5 pg (ref 26.0–34.0)
MCHC: 31.2 g/dL (ref 30.0–36.0)
MCV: 97.5 fL (ref 78.0–100.0)
PLATELETS: 292 10*3/uL (ref 150–400)
RBC: 3.25 MIL/uL — AB (ref 3.87–5.11)
RDW: 16 % — ABNORMAL HIGH (ref 11.5–15.5)
WBC: 12 10*3/uL — ABNORMAL HIGH (ref 4.0–10.5)

## 2014-01-24 LAB — BASIC METABOLIC PANEL
BUN: 29 mg/dL — AB (ref 6–23)
CO2: 24 meq/L (ref 19–32)
CREATININE: 0.8 mg/dL (ref 0.50–1.10)
Calcium: 8.4 mg/dL (ref 8.4–10.5)
Chloride: 101 mEq/L (ref 96–112)
GFR calc Af Amer: 79 mL/min — ABNORMAL LOW (ref >90)
GFR calc non Af Amer: 68 mL/min — ABNORMAL LOW (ref >90)
Glucose, Bld: 203 mg/dL — ABNORMAL HIGH (ref 70–99)
Potassium: 5.7 mEq/L — ABNORMAL HIGH (ref 3.7–5.3)
Sodium: 136 mEq/L — ABNORMAL LOW (ref 137–147)

## 2014-01-24 LAB — GLUCOSE, CAPILLARY: GLUCOSE-CAPILLARY: 158 mg/dL — AB (ref 70–99)

## 2014-01-24 LAB — NA AND K (SODIUM & POTASSIUM), RAND UR
POTASSIUM UR: 60 meq/L
Sodium, Ur: 69 mEq/L

## 2014-01-24 LAB — EXPECTORATED SPUTUM ASSESSMENT W REFEX TO RESP CULTURE

## 2014-01-24 LAB — EXPECTORATED SPUTUM ASSESSMENT W GRAM STAIN, RFLX TO RESP C

## 2014-01-24 LAB — CREATININE, URINE, RANDOM: CREATININE, URINE: 133.1 mg/dL

## 2014-01-24 MED ORDER — IPRATROPIUM-ALBUTEROL 0.5-2.5 (3) MG/3ML IN SOLN
3.0000 mL | Freq: Four times a day (QID) | RESPIRATORY_TRACT | Status: DC
  Administered 2014-01-24 – 2014-01-27 (×12): 3 mL via RESPIRATORY_TRACT
  Filled 2014-01-24 (×12): qty 3

## 2014-01-24 MED ORDER — HYDROCHLOROTHIAZIDE 25 MG PO TABS
25.0000 mg | ORAL_TABLET | Freq: Every day | ORAL | Status: DC
  Administered 2014-01-24 – 2014-01-27 (×4): 25 mg via ORAL
  Filled 2014-01-24 (×4): qty 1

## 2014-01-24 MED ORDER — PREDNISONE 50 MG PO TABS
60.0000 mg | ORAL_TABLET | Freq: Every day | ORAL | Status: DC
  Administered 2014-01-24 – 2014-01-27 (×4): 60 mg via ORAL
  Filled 2014-01-24 (×5): qty 1

## 2014-01-24 NOTE — Progress Notes (Signed)
[facility]  [facility] is providing the following services: RW  If patient discharges after hours, please call [phone].   Linward Headland 01/24/2014, 1:49 PM

## 2014-01-24 NOTE — Progress Notes (Signed)
Patient ambulated 50 feet using a front wheel walker. Patient tolerated fair. Pt maintained oxygen level of 90-94% on 3L of oxygen. Will continue to monitor and ambulate more this afternoon.

## 2014-01-24 NOTE — Progress Notes (Signed)
Physical Therapy Treatment Patient Details Name: Dawn Montoya MRN: [id number] DOB: Jan 25, 1932 Today's Date: 01/24/2014    History of Present Illness Pt is a 80 year old female admitted 3/28 with a 3-4 day history shortness breath wheezing dizziness and orthostasis, setting of black tarry stools; PMH of COPD, HTN, hypothyroid, breast cancer s/p mastectomy    PT Comments    Pt sitting on EOB on arrival supporting self with B UE's on RW.  Assisted with amb in hallway limited distance on 3 lts 02.  DOE 3/4 and pt required MAX VC's on purse lip breathing and to decrease gait speed for energy conservation.  Returned to room then assisted to BR.  Pt required long rest break between each activity.  Assisted pt back to bed per request.   Follow Up Recommendations  No PT follow up     Equipment Recommendations  None recommended by PT    Recommendations for Other Services       Precautions / Restrictions Precautions Precautions: Fall Precaution Comments: monitor sats Restrictions Weight Bearing Restrictions: No    Mobility  Bed Mobility Overal bed mobility: Needs Assistance Bed Mobility: Sit to Supine       Sit to supine: Min assist;Mod assist   General bed mobility comments: increased asssit for B LE's up onto bed  Transfers Overall transfer level: Needs assistance Equipment used: Rolling walker (2 wheeled) Transfers: Sit to/from Stand Sit to Stand: Supervision;Min guard         General transfer comment: verbal cues for safety and positioning of walker plus safety with turns  Ambulation/Gait Ambulation/Gait assistance: Supervision;Min guard Ambulation Distance (Feet): 60 Feet (30' x 2 one sitting rest break) Assistive device: Rolling walker (2 wheeled) Gait Pattern/deviations: Step-through pattern;Trunk flexed Gait velocity: decreased   General Gait Details: On 3 lts O2 sats ranged from 81 - 94% with VC's to decrease gait speed and perform purse lip breathing during  activity.     Stairs            Wheelchair Mobility    Modified Rankin (Stroke Patients Only)       Balance                                    Cognition                            Exercises      General Comments        Pertinent Vitals/Pain     Home Living                      Prior Function            PT Goals (current goals can now be found in the care plan section) Progress towards PT goals: Progressing toward goals    Frequency  Min 3X/week    PT Plan      Co-evaluation             End of Session Equipment Utilized During Treatment: Gait belt;Oxygen Activity Tolerance: Patient tolerated treatment well Patient left: in bed;with call bell/phone within reach     Time: 1355-1425 PT Time Calculation (min): 30 min  Charges:  $Gait Training: 8-22 mins $Therapeutic Activity: 8-22 mins  G Codes:      Rica Koyanagi  PTA [facility]  Acute  Rehab Pager      [phone]

## 2014-01-24 NOTE — Progress Notes (Signed)
Patient ID: Dawn Montoya, female   DOB: Dec 06, 1931, 80 y.o.   MRN: [id number] [facility] Gastroenterology Progress Note  Dawn Montoya 80 y.o. [birth year]   Subjective: Reports tolerating solid food for lunch without any N/V/abdominal pain. Had several loose BMs yesterday. Feels ok. Reports that she is trying to ambulate today.  Objective: Vital signs in last 24 hours: Filed Vitals:   01/24/14 1406  BP: 124/97  Pulse: 91  Temp: 97.8 F (36.6 C)  Resp: 20    Physical Exam: Gen: alert, no acute distress, obese Abd: distended, nontender, +BS  Lab Results:  Recent Labs  01/21/14 1655 01/23/14 0355 01/23/14 1415 01/24/14 0405  NA 140 137  --  136*  K 5.5* 5.6* 5.9* 5.7*  CL 101 101  --  101  CO2 30 27  --  24  GLUCOSE 131* 197*  --  203*  BUN 41* 34*  --  29*  CREATININE 1.07 0.88  --  0.80  CALCIUM 8.8 8.2*  --  8.4  MG 3.7*  --   --   --     Recent Labs  01/23/14 0355  AST 22  ALT 25  ALKPHOS 53  BILITOT 0.5  PROT 5.9*  ALBUMIN 3.4*    Recent Labs  01/23/14 0355 01/24/14 0405  WBC 12.6* 12.0*  HGB 8.9* 9.9*  HCT 28.8* 31.7*  MCV 97.6 97.5  PLT 342 292    Recent Labs  01/23/14 0355  LABPROT 13.9  INR 1.09      Assessment/Plan: 80 yo with resolving colonic ileus. Tolerating solid food and moving bowels. Hold off on repeat Xray. Continue supportive care. Will follow.   Alston C. 01/24/2014, 4:01 PM

## 2014-01-24 NOTE — Progress Notes (Signed)
Note: This document was prepared with digital dictation and possible smart phrase technology. Any transcriptional errors that result from this process are unintentional.   Dawn Montoya QVZ:[id number] DOB: 03/29/1932 DOA: 01/18/2014 PCP: No primary provider on file.  Brief narrative: 80 y/o ?, known prior history COPD, Htn, hypothyroid, Breast Ca s/p Mastectomy admitted to the hospital 3/28 with a 3-4 day history shortness breath wheezing dizziness and orthostasis, in the setting of black tarry stools (on chronic NSAIDs for arthritis) Emergency room workup = normal Troponins negative [facility] before 84 Hemoglobin 8.3 INR 0.90 Admitted and worked up for acute on chronic respiratory failure-thought to be secondary to COPD exacerbation with negative flu test, the normal, echocardiogram grade 1 diastolic dysfunction-received one unit pack red blood cells 01/20/14 Noted significant anxiety and some confabulation during hospital stay wherein she was reporting that she was feeling worse than she actually felt and was reporting that she was noT ambulating when in fact attempts have been made to do this but her effort was minimal  Past medical history-As per Problem list Chart reviewed as below- Reviewed  Consultants:  Gastroenterology  Procedures:  Upper endoscopy 3/28  Colonoscopy 3/30  Antibiotics:  Levaquin 3/28-->  Tamiflu 3/28-3/29   Subjective  Doing fair Passing stool yesterday Has not had breakfast today but tolerating diet overall Reported to her daughter that she did not walk around but she had to be urged to 4 times to get up out of the chair No chest pain Greenish sputum No nausea no vomiting   Objective    Interim History: none  Telemetry: pvc'S   Objective: Filed Vitals:   01/24/14 0424 01/24/14 0623 01/24/14 0625 01/24/14 0741  BP: 137/60 148/74 148/72   Pulse: 66 84 83   Temp: 98.4 F (36.9 C)     TempSrc: Oral     Resp: 22 24    Height:        Weight: 122.4 kg (269 lb 13.5 oz)     SpO2: 99% 94% 99% 98%    Intake/Output Summary (Last 24 hours) at 01/24/14 1128 Last data filed at 01/24/14 0647  Gross per 24 hour  Intake  584.5 ml  Output    350 ml  Net  234.5 ml    Exam:  General: EOMI, ncat Cardiovascular:  s1 s2 no m/r/g, appears in rrr Respiratory: clear, no added sound Abdomen: distended upper abdomen,less firmthan prior exam, no tenderness, no rebound or gaurding Skinno le edema Neuro intact but a little confused  Data Reviewed: Basic Metabolic Panel:  Recent Labs Lab 01/18/14 1532 01/18/14 2100 01/19/14 0325 01/20/14 0315 01/21/14 1655 01/23/14 0355 01/23/14 1415 01/24/14 0405  NA 134*  --  137 139 140 137  --  136*  K 4.1  --  4.9 4.4 5.5* 5.6* 5.9* 5.7*  CL 94*  --  103 102 101 101  --  101  CO2 26  --  25 24 30 27   --  24  GLUCOSE 128*  --  187* 209* 131* 197*  --  203*  BUN 25*  --  19 18 41* 34*  --  29*  CREATININE 0.88  --  0.85 0.79 1.07 0.88  --  0.80  CALCIUM 9.2  --  8.7 9.1 8.8 8.2*  --  8.4  MG  --  2.2  --   --  3.7*  --   --   --    Liver Function Tests:  Recent Labs Lab 01/18/14 1532 01/19/14  0539 01/23/14 0355  AST 16 15 22   ALT 16 14 25   ALKPHOS 56 54 53  BILITOT 0.3 0.4 0.5  PROT 6.6 6.1 5.9*  ALBUMIN 3.5 3.3* 3.4*    Recent Labs Lab 01/18/14 1532  LIPASE 32   No results found for this basename: AMMONIA,  in the last 168 hours CBC:  Recent Labs Lab 01/18/14 1532  01/20/14 0315 01/21/14 0335 01/22/14 0330 01/23/14 0355 01/24/14 0405  WBC 11.9*  < > 12.8* 18.5* 14.5* 12.6* 12.0*  NEUTROABS 9.3*  --   --   --   --   --   --   HGB 8.3*  < > 7.3* 8.9* 8.9* 8.9* 9.9*  HCT 25.0*  < > 23.2* 28.6* 28.9* 28.8* 31.7*  MCV 93.6  < > 96.3 96.9 97.6 97.6 97.5  PLT 379  < > 348 369 373 342 292  < > = values in this interval not displayed. Cardiac Enzymes:  Recent Labs Lab 01/18/14 1532 01/18/14 2100 01/19/14 0325 01/19/14 0910  TROPONINI <0.30 <0.30 <0.30  <0.30   BNP: No components found with this basename: POCBNP,  CBG:  Recent Labs Lab 01/20/14 0753 01/21/14 0737 01/22/14 0724 01/23/14 0725 01/24/14 0741  GLUCAP 186* 164* 192* 162* 158*    Recent Results (from the past 240 hour(s))  URINE CULTURE     Status: None   Collection Time    01/18/14  7:20 PM      Result Value Ref Range Status   Specimen Description URINE, CLEAN CATCH   Final   Special Requests NONE   Final   Culture  Setup Time     Final   Value: 01/19/2014 01:39     Performed at [facility]     Final   Value: 75,000 COLONIES/ML     Performed at [facility]   Culture     Final   Value: Multiple bacterial morphotypes present, none predominant. Suggest appropriate recollection if clinically indicated.     Performed at [facility]   Report Status 01/20/2014 FINAL   Final  MRSA PCR SCREENING     Status: None   Collection Time    01/18/14  8:27 PM      Result Value Ref Range Status   MRSA by PCR NEGATIVE  NEGATIVE Final   Comment:            The GeneXpert MRSA Assay (FDA     approved for NASAL specimens     only), is one component of a     comprehensive MRSA colonization     surveillance program. It is not     intended to diagnose MRSA     infection nor to guide or     monitor treatment for     MRSA infections.  CULTURE, EXPECTORATED SPUTUM-ASSESSMENT     Status: None   Collection Time    01/24/14  6:12 AM      Result Value Ref Range Status   Specimen Description SPUTUM   Final   Special Requests NONE   Final   Sputum evaluation     Final   Value: THIS SPECIMEN IS ACCEPTABLE. RESPIRATORY CULTURE REPORT TO FOLLOW.   Report Status 01/24/2014 FINAL   Final     Studies:              All Imaging reviewed and is as per above notation   Scheduled Meds: . budesonide (PULMICORT) nebulizer solution  0.25 mg Nebulization BID  . busPIRone  5 mg Oral TID  . dextromethorphan-guaiFENesin  2 tablet Oral BID  .  hydrochlorothiazide  25 mg Oral Daily  . ipratropium-albuterol  3 mL Nebulization Q6H  . levofloxacin  500 mg Oral Daily  . levothyroxine  75 mcg Oral QAC breakfast  . loratadine  10 mg Oral Daily  . pantoprazole  40 mg Oral Daily  . pneumococcal 23 valent vaccine  0.5 mL Intramuscular Tomorrow-1000  . polyethylene glycol  17 g Oral TID  . predniSONE  60 mg Oral QAC breakfast  . sodium chloride  1,000 mL Intravenous Once  . sodium chloride  500 mL Intravenous Once  . sodium chloride  3 mL Intravenous Q12H   Continuous Infusions: . sodium chloride 10 mL/hr at 01/23/14 2020     Assessment/Plan: A/chronic respiratory failure-2/2 to  Stage "c" to "d" O2 dependant COPD-taper nebulizations every 6 hours, transitioned Solu-Medrol to by mouth prednisone 60 mg for 5 more days Levaquin  to PO.  MOnitor.  Will need home O2 on d/c home-she has a significant anxiety component to her COPD Anxiety-increase BuSpar to 5 mg 3 times a day-patient will need reorientation Melena + UGIB-undetermined site-probable Upper GI-both colonoscopy/endoscopy neg-hemoglobin is very stable at present therefore we will defer management as an inpatient and patient should followup with [facility] as an outpatient Ileus-replace mag/Phos-Ct scan doesn't show SBO-continue ambulation. I have encouraged her to do this on multiple times this admission Reactive leukocytosis Hypothyroidism-continue levothyroxine 75 mcg daily Iron def anemia-s/p I U PRBC.  Hb stable 8-9 range currently Impaired glucose tolerance- monitor with SSI coverage-blood sugars 160   Code Status: Full Family Communication: Long discussion 01/24/14 with regards to likely discharge home with therapy tomorrow 01/25/14. Explained that her acute issues are chronic family and that she will need outpatient followup for COPD, anxiety, other issues Disposition Plan: inpatient   Verneita Griffes, MD  [facility] Pager [phone] 01/24/2014, 11:28 AM    LOS: 6 days

## 2014-01-25 ENCOUNTER — Inpatient Hospital Stay (HOSPITAL_COMMUNITY): Payer: Medicare Other

## 2014-01-25 LAB — CBC
HEMATOCRIT: 31.3 % — AB (ref 36.0–46.0)
Hemoglobin: 9.8 g/dL — ABNORMAL LOW (ref 12.0–15.0)
MCH: 30.4 pg (ref 26.0–34.0)
MCHC: 31.3 g/dL (ref 30.0–36.0)
MCV: 97.2 fL (ref 78.0–100.0)
PLATELETS: 303 10*3/uL (ref 150–400)
RBC: 3.22 MIL/uL — ABNORMAL LOW (ref 3.87–5.11)
RDW: 15.9 % — ABNORMAL HIGH (ref 11.5–15.5)
WBC: 11.9 10*3/uL — ABNORMAL HIGH (ref 4.0–10.5)

## 2014-01-25 LAB — GLUCOSE, CAPILLARY: Glucose-Capillary: 121 mg/dL — ABNORMAL HIGH (ref 70–99)

## 2014-01-25 NOTE — Progress Notes (Signed)
Note: This document was prepared with digital dictation and possible smart phrase technology. Any transcriptional errors that result from this process are unintentional.   Dawn Montoya ZGY:[id number] DOB: 28-Jan-1932 DOA: 01/18/2014 PCP: No primary provider on file.  Brief narrative: 80 y/o ?, known prior history COPD, Htn, hypothyroid, Breast Ca s/p Mastectomy admitted to the hospital 3/28 with a 3-4 day history shortness breath wheezing dizziness and orthostasis, in the setting of black tarry stools (on chronic NSAIDs for arthritis) Emergency room workup = normal Troponins negative [facility] before 84 Hemoglobin 8.3 INR 0.90 Admitted and worked up for acute on chronic respiratory failure-thought to be secondary to COPD exacerbation with negative flu test, the normal, echocardiogram grade 1 diastolic dysfunction-received one unit pack red blood cells 01/20/14 Noted significant anxiety and some confabulation during hospital stay wherein she was reporting that she was feeling worse than she actually felt and was reporting that she was noT ambulating when in fact attempts have been made to do this but her effort was minimal Her ileus was persistent despite passing large stool 01/24/14 per AXr done on 4/4 and GI recommended to monitor patient o/n to ensure clearance.  Past medical history-As per Problem list Chart reviewed as below- Reviewed  Consultants:  Gastroenterology  Procedures:  Upper endoscopy 3/28  Colonoscopy 3/30  Antibiotics:  Levaquin 3/28-->  Tamiflu 3/28-3/29   Subjective  Doing fair Passing large stool yesterday Still claims sob although better No n/v/cp Taking diet well No cp or fevers    Objective    Interim History: none  Telemetry: pvc'S   Objective: Filed Vitals:   01/25/14 0950 01/25/14 1000 01/25/14 1002 01/25/14 1333  BP:    139/70  Pulse:    81  Temp:    98.2 F (36.8 C)  TempSrc:    Oral  Resp:    20  Height:      Weight:        SpO2: 85% 89% 96% 97%    Intake/Output Summary (Last 24 hours) at 01/25/14 1638 Last data filed at 01/25/14 0900  Gross per 24 hour  Intake    240 ml  Output   1775 ml  Net  -1535 ml    Exam:  General: EOMI, ncat Cardiovascular:  s1 s2 no m/r/g, appears in rrr Respiratory: clear, no added sound Abdomen: distended upper abdomen,less firmthan prior exam, no tenderness, no rebound or gaurding Skinno le edema Neuro intact but a little confused  Data Reviewed: Basic Metabolic Panel:  Recent Labs Lab 01/18/14 2100  01/19/14 0325 01/20/14 0315 01/21/14 1655 01/23/14 0355 01/23/14 1415 01/24/14 0405  NA  --   --  137 139 140 137  --  136*  K  --   < > 4.9 4.4 5.5* 5.6* 5.9* 5.7*  CL  --   --  103 102 101 101  --  101  CO2  --   --  25 24 30 27   --  24  GLUCOSE  --   --  187* 209* 131* 197*  --  203*  BUN  --   --  19 18 41* 34*  --  29*  CREATININE  --   --  0.85 0.79 1.07 0.88  --  0.80  CALCIUM  --   --  8.7 9.1 8.8 8.2*  --  8.4  MG 2.2  --   --   --  3.7*  --   --   --   < > = values  in this interval not displayed. Liver Function Tests:  Recent Labs Lab 01/19/14 0325 01/23/14 0355  AST 15 22  ALT 14 25  ALKPHOS 54 53  BILITOT 0.4 0.5  PROT 6.1 5.9*  ALBUMIN 3.3* 3.4*   No results found for this basename: LIPASE, AMYLASE,  in the last 168 hours No results found for this basename: AMMONIA,  in the last 168 hours CBC:  Recent Labs Lab 01/21/14 0335 01/22/14 0330 01/23/14 0355 01/24/14 0405 01/25/14 0459  WBC 18.5* 14.5* 12.6* 12.0* 11.9*  HGB 8.9* 8.9* 8.9* 9.9* 9.8*  HCT 28.6* 28.9* 28.8* 31.7* 31.3*  MCV 96.9 97.6 97.6 97.5 97.2  PLT 369 373 342 292 303   Cardiac Enzymes:  Recent Labs Lab 01/18/14 2100 01/19/14 0325 01/19/14 0910  TROPONINI <0.30 <0.30 <0.30   BNP: No components found with this basename: POCBNP,  CBG:  Recent Labs Lab 01/21/14 0737 01/22/14 0724 01/23/14 0725 01/24/14 0741 01/25/14 0800  GLUCAP 164* 192* 162*  158* 121*    Recent Results (from the past 240 hour(s))  URINE CULTURE     Status: None   Collection Time    01/18/14  7:20 PM      Result Value Ref Range Status   Specimen Description URINE, CLEAN CATCH   Final   Special Requests NONE   Final   Culture  Setup Time     Final   Value: 01/19/2014 01:39     Performed at [facility]     Final   Value: 75,000 COLONIES/ML     Performed at [facility]   Culture     Final   Value: Multiple bacterial morphotypes present, none predominant. Suggest appropriate recollection if clinically indicated.     Performed at [facility]   Report Status 01/20/2014 FINAL   Final  MRSA PCR SCREENING     Status: None   Collection Time    01/18/14  8:27 PM      Result Value Ref Range Status   MRSA by PCR NEGATIVE  NEGATIVE Final   Comment:            The GeneXpert MRSA Assay (FDA     approved for NASAL specimens     only), is one component of a     comprehensive MRSA colonization     surveillance program. It is not     intended to diagnose MRSA     infection nor to guide or     monitor treatment for     MRSA infections.  CULTURE, EXPECTORATED SPUTUM-ASSESSMENT     Status: None   Collection Time    01/24/14  6:12 AM      Result Value Ref Range Status   Specimen Description SPUTUM   Final   Special Requests NONE   Final   Sputum evaluation     Final   Value: THIS SPECIMEN IS ACCEPTABLE. RESPIRATORY CULTURE REPORT TO FOLLOW.   Report Status 01/24/2014 FINAL   Final  CULTURE, RESPIRATORY (NON-EXPECTORATED)     Status: None   Collection Time    01/24/14  6:12 AM      Result Value Ref Range Status   Specimen Description SPUTUM   Final   Special Requests NONE   Final   Gram Stain     Final   Value: RARE WBC PRESENT,BOTH PMN AND MONONUCLEAR     RARE SQUAMOUS EPITHELIAL CELLS PRESENT     RARE GRAM POSITIVE  COCCI IN PAIRS     RARE YEAST     Performed at [facility]   Culture     Final   Value:  NORMAL OROPHARYNGEAL FLORA     Performed at [facility]   Report Status PENDING   Incomplete     Studies:              All Imaging reviewed and is as per above notation   Scheduled Meds: . budesonide (PULMICORT) nebulizer solution  0.25 mg Nebulization BID  . busPIRone  5 mg Oral TID  . dextromethorphan-guaiFENesin  2 tablet Oral BID  . hydrochlorothiazide  25 mg Oral Daily  . ipratropium-albuterol  3 mL Nebulization Q6H  . levofloxacin  500 mg Oral Daily  . levothyroxine  75 mcg Oral QAC breakfast  . loratadine  10 mg Oral Daily  . pantoprazole  40 mg Oral Daily  . pneumococcal 23 valent vaccine  0.5 mL Intramuscular Tomorrow-1000  . polyethylene glycol  17 g Oral TID  . predniSONE  60 mg Oral QAC breakfast  . sodium chloride  1,000 mL Intravenous Once  . sodium chloride  500 mL Intravenous Once  . sodium chloride  3 mL Intravenous Q12H   Continuous Infusions: . sodium chloride 10 mL/hr at 01/23/14 2020     Assessment/Plan: A/chronic respiratory failure-2/2 to  Stage "c" to "d" O2 dependant COPD-taper nebulizations every 6 hours, transitioned Solu-Medrol to by mouth prednisone 60 mg for 5 more days Levaquin  to PO.    Will need home O2 on d/c home-she has a significant anxiety component to her COPD Anxiety-increase BuSpar to 5 mg 3 times a day-patient will need reorientation Ileus-potassium actually elevated.  AXR showing persisting ileus.  Passing stool however.  Will follow GI recs and monitor overnight-continue ambulation. I have encouraged her to do this on multiple times this admission.' Hyperkalemia-discussed with Dr. Posey Pronto 4/3 and this is benign likely 2/2 to withdrawal of HCTZ.  I will confer with him again in am but probably patient may benefit from low dose kayexalate as OP Melena + UGIB-undetermined site-probable Upper GI-both colonoscopy/endoscopy neg-hemoglobin is very stable at present therefore we will defer management as an inpatient and patient should  followup with [facility] as an outpatient Hypothyroidism-continue levothyroxine 75 mcg daily Iron def anemia-s/p I U PRBC.  Hb stable 8-9 range currently Impaired glucose tolerance- monitor with SSI coverage-blood sugars 160   Code Status: Full Family Communication: none at bedside-will update in am Disposition Plan: inpatient   Verneita Griffes, MD  [facility] Pager [phone] 01/25/2014, 4:38 PM    LOS: 7 days

## 2014-01-25 NOTE — Progress Notes (Signed)
Pt. Refused to walk 

## 2014-01-25 NOTE — Progress Notes (Signed)
Patient ID: Dawn Montoya, female   DOB: 1931-11-03, 80 y.o.   MRN: [id number] [facility] Progress Note  Dawn Montoya 80 y.o. 02/21/1932   Subjective: Feels ok except for feeling weak when walking. Denies abdominal pain, nausea, or vomiting. Reports minimal stool overnight but large BM yesterday per nurse. Tolerating solid food.   Objective: Vital signs: Filed Vitals:   01/25/14 0600  BP: 136/58  Pulse: 92  Temp: 98.4  Resp: 22    Physical Exam: Gen: alert, no acute distress, obese  Abd: mild distention, +BS, nontender, obese, soft  Lab Results:  Recent Labs  01/23/14 0355 01/23/14 1415 01/24/14 0405  NA 137  --  136*  K 5.6* 5.9* 5.7*  CL 101  --  101  CO2 27  --  24  GLUCOSE 197*  --  203*  BUN 34*  --  29*  CREATININE 0.88  --  0.80  CALCIUM 8.2*  --  8.4    Recent Labs  01/23/14 0355  AST 22  ALT 25  ALKPHOS 53  BILITOT 0.5  PROT 5.9*  ALBUMIN 3.4*    Recent Labs  01/24/14 0405 01/25/14 0459  WBC 12.0* 11.9*  HGB 9.9* 9.8*  HCT 31.7* 31.3*  MCV 97.5 97.2  PLT 292 303      Assessment/Plan: 80 yo with resolving colonic ileus moving bowels yesterday and tolerating diet but due to distention on exam will recheck Xray today to compare with previous films. If that is ok then she can go home from a GI standpoint and f/u with Dr. Oletta Lamas in 3-4 weeks.   [place] C. 01/25/2014, 8:43 AM

## 2014-01-26 LAB — CBC
HCT: 33.1 % — ABNORMAL LOW (ref 36.0–46.0)
HEMOGLOBIN: 10.8 g/dL — AB (ref 12.0–15.0)
MCH: 31.1 pg (ref 26.0–34.0)
MCHC: 32.6 g/dL (ref 30.0–36.0)
MCV: 95.4 fL (ref 78.0–100.0)
Platelets: 291 10*3/uL (ref 150–400)
RBC: 3.47 MIL/uL — ABNORMAL LOW (ref 3.87–5.11)
RDW: 15.9 % — AB (ref 11.5–15.5)
WBC: 13.5 10*3/uL — ABNORMAL HIGH (ref 4.0–10.5)

## 2014-01-26 LAB — BASIC METABOLIC PANEL
BUN: 26 mg/dL — AB (ref 6–23)
CO2: 33 meq/L — AB (ref 19–32)
CREATININE: 0.97 mg/dL (ref 0.50–1.10)
Calcium: 8.9 mg/dL (ref 8.4–10.5)
Chloride: 94 mEq/L — ABNORMAL LOW (ref 96–112)
GFR calc non Af Amer: 54 mL/min — ABNORMAL LOW (ref >90)
GFR, EST AFRICAN AMERICAN: 62 mL/min — AB (ref >90)
Glucose, Bld: 116 mg/dL — ABNORMAL HIGH (ref 70–99)
POTASSIUM: 3.6 meq/L — AB (ref 3.7–5.3)
Sodium: 136 mEq/L — ABNORMAL LOW (ref 137–147)

## 2014-01-26 LAB — CULTURE, RESPIRATORY W GRAM STAIN: Culture: NORMAL

## 2014-01-26 LAB — GLUCOSE, CAPILLARY: Glucose-Capillary: 119 mg/dL — ABNORMAL HIGH (ref 70–99)

## 2014-01-26 LAB — CULTURE, RESPIRATORY

## 2014-01-26 NOTE — Progress Notes (Addendum)
Note: This document was prepared with digital dictation and possible smart phrase technology. Any transcriptional errors that result from this process are unintentional.   Cruz Devilla LEX:[id number] DOB: 1931/12/03 DOA: 01/18/2014 PCP: No primary provider on file.  Brief narrative: 80 y/o ?, known prior history COPD, Htn, hypothyroid, Breast Ca s/p Mastectomy admitted to the hospital 3/28 with a 3-4 day history shortness breath wheezing dizziness and orthostasis, in the setting of black tarry stools (on chronic NSAIDs for arthritis) Emergency room workup = normal troponins negative Hemoglobin 8.3 INR 0.90 Admitted and worked up for acute on chronic respiratory failure-thought to be secondary to COPD exacerbation with negative flu test, the normal, echocardiogram grade 1 diastolic dysfunction-received one unit pack red blood cells 01/20/14 Noted significant anxiety and some confabulation during hospital stay\ Her ileus was persistent despite passing large stool 01/24/14 per AXr done on 4/4 and GI recommended to monitor patient o/n to ensure clearance. Shortness of breath slow to resolve and she had persisting wheeze  Past medical history-As per Problem list Chart reviewed as below- Reviewed  Consultants:  Gastroenterology  Procedures:  Upper endoscopy 3/28  Colonoscopy 3/30  Antibiotics:  Levaquin 3/28-->  Tamiflu 3/28-3/29   Subjective  Doing fair Still wheezy this afternoon with decreased air entry Tolerating diet Small stool yesterday Eating and drinking fairly Refusing to ambulate on schedule  Objective    Interim History: none  Telemetry: pvc'S   Objective: Filed Vitals:   01/26/14 0631 01/26/14 0636 01/26/14 0640 01/26/14 1356  BP: 161/71 147/66 120/56   Pulse: 88 91 106   Temp: 97.9 F (36.6 C)     TempSrc: Oral     Resp:      Height:      Weight: 120.2 kg (264 lb 15.9 oz)     SpO2: 96%   92%    Intake/Output Summary (Last 24 hours) at 01/26/14  1420 Last data filed at 01/26/14 0845  Gross per 24 hour  Intake    720 ml  Output   1550 ml  Net   -830 ml    Exam:  General: EOMI, ncat Cardiovascular:  s1 s2 no m/r/g, appears in rrr Respiratory: wheeze noted bilaterally posteriorly but diminished air entry Abdomen: distended upper abdomen,less firmthan prior exam, no tenderness, no rebound or gaurding Skinno le edema Neuro intact but a little confused  Data Reviewed: Basic Metabolic Panel:  Recent Labs Lab 01/20/14 0315 01/21/14 1655 01/23/14 0355  01/24/14 0405 01/26/14 0828  NA 139 140 137  --  136* 136*  K 4.4 5.5* 5.6*  < > 5.7* 3.6*  CL 102 101 101  --  101 94*  CO2 24 30 27   --  24 33*  GLUCOSE 209* 131* 197*  --  203* 116*  BUN 18 41* 34*  --  29* 26*  CREATININE 0.79 1.07 0.88  --  0.80 0.97  CALCIUM 9.1 8.8 8.2*  --  8.4 8.9  MG  --  3.7*  --   --   --   --   < > = values in this interval not displayed. Liver Function Tests:  Recent Labs Lab 01/23/14 0355  AST 22  ALT 25  ALKPHOS 53  BILITOT 0.5  PROT 5.9*  ALBUMIN 3.4*   No results found for this basename: LIPASE, AMYLASE,  in the last 168 hours No results found for this basename: AMMONIA,  in the last 168 hours CBC:  Recent Labs Lab 01/22/14 0330 01/23/14 0355 01/24/14  0405 01/25/14 0459 01/26/14 0413  WBC 14.5* 12.6* 12.0* 11.9* 13.5*  HGB 8.9* 8.9* 9.9* 9.8* 10.8*  HCT 28.9* 28.8* 31.7* 31.3* 33.1*  MCV 97.6 97.6 97.5 97.2 95.4  PLT 373 342 292 303 291   Cardiac Enzymes: No results found for this basename: CKTOTAL, CKMB, CKMBINDEX, TROPONINI,  in the last 168 hours BNP: No components found with this basename: POCBNP,  CBG:  Recent Labs Lab 01/22/14 0724 01/23/14 0725 01/24/14 0741 01/25/14 0800 01/26/14 0753  GLUCAP 192* 162* 158* 121* 119*    Recent Results (from the past 240 hour(s))  URINE CULTURE     Status: None   Collection Time    01/18/14  7:20 PM      Result Value Ref Range Status   Specimen Description  URINE, CLEAN CATCH   Final   Special Requests NONE   Final   Culture  Setup Time     Final   Value: 01/19/2014 01:39     Performed at [place]     Final   Value: 75,000 COLONIES/ML     Performed at [facility]   Culture     Final   Value: Multiple bacterial morphotypes present, none predominant. Suggest appropriate recollection if clinically indicated.     Performed at [facility]   Report Status 01/20/2014 FINAL   Final  MRSA PCR SCREENING     Status: None   Collection Time    01/18/14  8:27 PM      Result Value Ref Range Status   MRSA by PCR NEGATIVE  NEGATIVE Final   Comment:            The GeneXpert MRSA Assay (FDA     approved for NASAL specimens     only), is one component of a     comprehensive MRSA colonization     surveillance program. It is not     intended to diagnose MRSA     infection nor to guide or     monitor treatment for     MRSA infections.  CULTURE, EXPECTORATED SPUTUM-ASSESSMENT     Status: None   Collection Time    01/24/14  6:12 AM      Result Value Ref Range Status   Specimen Description SPUTUM   Final   Special Requests NONE   Final   Sputum evaluation     Final   Value: THIS SPECIMEN IS ACCEPTABLE. RESPIRATORY CULTURE REPORT TO FOLLOW.   Report Status 01/24/2014 FINAL   Final  CULTURE, RESPIRATORY (NON-EXPECTORATED)     Status: None   Collection Time    01/24/14  6:12 AM      Result Value Ref Range Status   Specimen Description SPUTUM   Final   Special Requests NONE   Final   Gram Stain     Final   Value: RARE WBC PRESENT,BOTH PMN AND MONONUCLEAR     RARE SQUAMOUS EPITHELIAL CELLS PRESENT     RARE GRAM POSITIVE COCCI IN PAIRS     RARE YEAST     Performed at [facility]   Culture     Final   Value: NORMAL OROPHARYNGEAL FLORA     Performed at [facility]   Report Status 01/26/2014 FINAL   Final     Studies:              All Imaging reviewed and is as per above notation  Scheduled Meds: . budesonide (PULMICORT) nebulizer solution  0.25 mg Nebulization BID  . busPIRone  5 mg Oral TID  . dextromethorphan-guaiFENesin  2 tablet Oral BID  . hydrochlorothiazide  25 mg Oral Daily  . ipratropium-albuterol  3 mL Nebulization Q6H  . levofloxacin  500 mg Oral Daily  . levothyroxine  75 mcg Oral QAC breakfast  . loratadine  10 mg Oral Daily  . pantoprazole  40 mg Oral Daily  . pneumococcal 23 valent vaccine  0.5 mL Intramuscular Tomorrow-1000  . polyethylene glycol  17 g Oral TID  . predniSONE  60 mg Oral QAC breakfast  . sodium chloride  1,000 mL Intravenous Once  . sodium chloride  500 mL Intravenous Once  . sodium chloride  3 mL Intravenous Q12H   Continuous Infusions: . sodium chloride 10 mL/hr at 01/23/14 2020     Assessment/Plan: A/chronic respiratory failure-2/2 to  Stage "c" to "d" O2 dependant COPD-taper nebulizations every 6 hours, transitioned Solu-Medrol to by mouth prednisone 60 mg for 5 more days Levaquin  to PO.    Will need home O2 on d/c home-she has a significant anxiety component to her COPD-because of persisting wheeze and need for when necessary nebulizations, we will get an x-ray in the morning and reassess her after ambulation Anxiety-increase BuSpar to 5 mg 3 times a day-patient will need psychiatry and put his outpatient Ileus-potassium actually elevated.  AXR showing persisting ileus.  Passing stool however.  Will follow GI recs and monitor for resolution of abdominal distention. Hyperkalemia-discussed with Dr. Posey Pronto 4/3 and this is benign likely 2/2 to withdrawal of HCTZ.  Potassium has decreased and is now 3.6 and will have to be monitored as an outpatient Melena + UGIB-undetermined site-probable Upper GI-both colonoscopy/endoscopy neg-hemoglobin is very stable at present therefore we will defer management to [facility] GI as an outpatient Hypothyroidism-continue levothyroxine 75 mcg daily Iron def anemia-s/p I U PRBC.  Hb stable 8-9 range  currently Impaired glucose tolerance- monitor with SSI coverage-blood sugars 119   Code Status: Full Family Communication:? # fr HCPOA [name] [phone].  No answer on telephone Disposition Plan: inpatient   Verneita Griffes, MD  [facility] Pager [phone] 01/26/2014, 2:20 PM    LOS: 8 days

## 2014-01-26 NOTE — Care Management Note (Addendum)
Page 1 of 2   01/27/2014     4:27:31 PM   CARE MANAGEMENT NOTE 01/27/2014  Patient:  Dawn Montoya, Dawn Montoya   Account Number:  [id number]  Date Initiated:  01/21/2014  Documentation initiated by:  Gabriel Earing  Subjective/Objective Assessment:   Pt admitted with cco SOB     Action/Plan:   from home going to SNF   Anticipated DC Date:  01/27/2014   Anticipated DC Plan:  HOME/SELF CARE  In-house referral  Clinical Social Worker  PCP / [place]  CM consult      Choice offered to / List presented to:     DME arranged  [place]      DME agency  [place] arranged  HH-1 RN  [place]      [place].   Status of service:  Completed, signed off Medicare Important Message given?  NO (If response is "NO", the following Medicare IM given date fields will be blank) Date Medicare IM given:   Date Additional Medicare IM given:    Discharge Disposition:  [place]  Per UR Regulation:  Reviewed for med. necessity/level of care/duration of stay  If discussed at [place] of Stay Meetings, dates discussed:    Comments:  01/27/14 16:20 CM spoke with pt and explained the MD feels strongly she has HHPT/OT/RN/Aide.  Pt agrees.  CM called [facility] rep, Kristin with referral and contact of grandaughter [name] [phone]. No other CM needs were communicated.  Mariane Masters, BSN, [place]. 01/27/14 12:34 CM spoke with grandaughter of pt to see if she remembered name of agency which has provided oxygen to her grandmother in the past.  Grandaughter, [name] stated her grandmother has a Environmental consultant at home and a tank which they have filled in the past but has not lived in [place] for 6 months and has not received services from any agency since the move.  CM relayed this information to [facility] rep, Erasmo Downer who researched it and called back to say an up front  payment would not be necessary as pt's insurance has not been billed by any agency for oxygen support in quite some time.  [facility] DME rep, Jeanella Anton will deliver O2 and rolling walker to room prior to discharge.  CM called grandaughter to make her aware of aforementioned.  No other CM needs were communicated.  Mariane Masters, BSN, CM [phone].  01/26/14 KATHY MAHABIR RN,BSN NCM [phone] PATIENT STATES SHE RECENTLY MOVED FROM [place] WHERE SHE WAS ALREADY RECEIVING HOME 02,BUT DOESN'T REMEMBER DME COMPANY.SHE DID BRING THE HOME 02 TANK HERE TO [place] IT IS WITH HER DTR.SHE IS ON 02 HERE IN HOSPITAL.TC [facility] REP WHO INFORMED ME THAT IF PATIENT NEEDED HOME 02-THEY ARE UNABLE TO BILL HER INSURANCE SINCE SHE ALREADY HAS HOME 02 W/ANOTHER AGENCY.IT WOULD BE PRIVATE PAY,& [employer] WOULD BE NEEDED($150-350,& NEW SCRIPT).ENCOURAGED PATIENT TO CONTACT HER DTR TO ASSIST W/GETTING NAME OF COMPANY & COMPLETE PROCESS TO STOP BILLING SO A NEW DME AGENCY CAN START.ALREADY HAS RW-WILL NEED HOME RW ORDER.PROVIDED W/PCP LISTING.PATIENT VOICED UNDERSTANDING.  01/21/14 MMCGIBBONEY, RN, BSN Spoke with pt concerning discharge plans. Pt referred me to her granddaughter who was on the telephone concerning discharge plans. Granddaughter work from home and is with pt 24/7. Both pt and granddaughter state  there is no need for HHPT/HHRN. However the pt request a RW.

## 2014-01-26 NOTE — Progress Notes (Signed)
Patient ID: Dawn Montoya, female   DOB: 06-29-1932, 80 y.o.   MRN: [id number] [facility] Gastroenterology Progress Note  Dawn Montoya 80 y.o. 03-Jan-1932   Subjective: Eating solid food without any problems. Denies abdominal pain/N/V. Reports moving bowels today and yesterday. Persistent ileus on Xray yesterday. Reports dyspnea on exertion.  Objective: Vital signs: Filed Vitals:   01/26/14 1530  BP: 147/67  Pulse: 88  Temp: 98.3 F (36.8 C)  Resp: 20    Physical Exam: Gen: alert, no acute distress  Abd: softer, less distended, nontender, +BS, obese  Lab Results:  Recent Labs  01/24/14 0405 01/26/14 0828  NA 136* 136*  K 5.7* 3.6*  CL 101 94*  CO2 24 33*  GLUCOSE 203* 116*  BUN 29* 26*  CREATININE 0.80 0.97  CALCIUM 8.4 8.9   No results found for this basename: AST, ALT, ALKPHOS, BILITOT, PROT, ALBUMIN,  in the last 72 hours  Recent Labs  01/25/14 0459 01/26/14 0413  WBC 11.9* 13.5*  HGB 9.8* 10.8*  HCT 31.3* 33.1*  MCV 97.2 95.4  PLT 303 291      Assessment/Plan: 80 yo with a colonic ileus without any obstructive symptoms. Continue supportive care. Dyspnea managed by [facility]. Will sign off. Call if questions. Dr. Paulita Fujita available to see this week if needed.   [place] C. 01/26/2014, 6:22 PM

## 2014-01-26 NOTE — Progress Notes (Signed)
Pt was assisted on side of bed to take a sponge bath and then to [facility] for BM. Attempted to take some steps in the room prior to ambulation became very short of breath and audible wheezing noted. O2 sat on 3l [place] 93-94%. Back to bed and will attempt ambulation at a later time.

## 2014-01-27 ENCOUNTER — Inpatient Hospital Stay (HOSPITAL_COMMUNITY): Payer: Medicare Other

## 2014-01-27 LAB — CBC
HEMATOCRIT: 34.6 % — AB (ref 36.0–46.0)
Hemoglobin: 10.9 g/dL — ABNORMAL LOW (ref 12.0–15.0)
MCH: 30.6 pg (ref 26.0–34.0)
MCHC: 31.5 g/dL (ref 30.0–36.0)
MCV: 97.2 fL (ref 78.0–100.0)
Platelets: 249 10*3/uL (ref 150–400)
RBC: 3.56 MIL/uL — ABNORMAL LOW (ref 3.87–5.11)
RDW: 16.2 % — AB (ref 11.5–15.5)
WBC: 13.3 10*3/uL — AB (ref 4.0–10.5)

## 2014-01-27 MED ORDER — PREDNISONE 20 MG PO TABS
60.0000 mg | ORAL_TABLET | Freq: Every day | ORAL | Status: DC
Start: 2014-01-27 — End: 2014-02-22

## 2014-01-27 MED ORDER — BUDESONIDE 0.25 MG/2ML IN SUSP: 0.2500 mg | Freq: Two times a day (BID) | RESPIRATORY_TRACT | Status: DC

## 2014-01-27 MED ORDER — BUSPIRONE HCL 5 MG PO TABS: 5.0000 mg | ORAL_TABLET | Freq: Three times a day (TID) | ORAL | Status: DC

## 2014-01-27 MED ORDER — DM-GUAIFENESIN ER 30-600 MG PO TB12: 2.0000 | ORAL_TABLET | Freq: Two times a day (BID) | ORAL | Status: DC

## 2014-01-27 NOTE — Progress Notes (Signed)
Patient desat to 82% on RA

## 2014-01-27 NOTE — Progress Notes (Addendum)
Physical Therapy Treatment Patient Details Name: Dawn Montoya MRN: [id number] DOB: 05/08/1932 Today's Date: 01/27/2014    History of Present Illness Pt is a 80 year old female admitted 3/28 with a 3-4 day history shortness breath wheezing dizziness and orthostasis, setting of black tarry stools; PMH of COPD, HTN, hypothyroid, breast cancer s/p mastectomy    PT Comments    Max encouragement for pt to ambulate. O2 sats dropped to 82% on RA while ambulating-RN made aware of this for documentation purposes.   Follow Up Recommendations  Home health PT (to ensure safe mobility in home environment and to encourage progressive activity. Spoke with MD about ordering this if he was in agreement.      Equipment Recommendations  None recommended by PT    Recommendations for Other Services       Precautions / Restrictions Precautions Precautions: Fall Precaution Comments: monitor sats Restrictions Weight Bearing Restrictions: No    Mobility  Bed Mobility                  Transfers     Transfers: Sit to/from Stand Sit to Stand: Min guard         General transfer comment: verbal cues for safety  Ambulation/Gait Ambulation/Gait assistance: Min guard Ambulation Distance (Feet): 75 Feet Assistive device: Rolling walker (2 wheeled) Gait Pattern/deviations: Step-through pattern;Trunk flexed     General Gait Details: O2 sats 82-83% on RA during ambulation (for O2 saturation level test). close guard for safety. fatigues easily. VCs for pursed lip breathing. 1 brief standing rest break.    Stairs            Wheelchair Mobility    Modified Rankin (Stroke Patients Only)       Balance                                    Cognition Arousal/Alertness: Awake/alert Behavior During Therapy: WFL for tasks assessed/performed Overall Cognitive Status: Within Functional Limits for tasks assessed                      Exercises      General  Comments        Pertinent Vitals/Pain 93% 3L at rest; 82-83% RA during ambulation    Home Living                      Prior Function            PT Goals (current goals can now be found in the care plan section) Progress towards PT goals: Progressing toward goals    Frequency  Min 3X/week    PT Plan Discharge plan needs to be updated    Co-evaluation             End of Session   Activity Tolerance: Patient limited by fatigue Patient left: in bed;with call bell/phone within reach     Time: 1434-1444 PT Time Calculation (min): 10 min  Charges:  $Gait Training: 8-22 mins                    G Codes:      Weston Anna, MPT Pager: [phone]

## 2014-01-27 NOTE — Progress Notes (Signed)
PT Cancellation Note  Patient Details Name: Dawn Montoya MRN: [id number] DOB: 1932-06-02   Cancelled Treatment:    Reason Eval/Treat Not Completed: attempted PT tx session. Pt declined to participate with therapy at this time. Pt reports she just finished walking a short distance  and preferred to rest. Pt also states she plans to d/c home later today.    Weston Anna, MPT Pager: [phone]

## 2014-01-27 NOTE — Discharge Summary (Signed)
Physician Discharge Summary  Dawn Montoya [id number] DOB: 1931/11/13 DOA: 01/18/2014  PCP: No primary provider on file.  Admit date: 01/18/2014 Discharge date: 01/27/2014  Time spent: 40 minutes  Recommendations for Outpatient Follow-up:  1. Get basic metabolic panel in about a week 2. Consider HbA1c and age-appropriate amount of care 3. Consider visit with gastroenterology in one to 2 weeks as an outpatient-she had anemia that cannot be explained by endoscopy or colonoscopy and as such may need small bowel follow-through or capsule study 4. The patient will need a psychiatrist and pulmonologist as well 5. Steroids to E. discontinued 01/29/14 6. She completed a seven-day course of antibiotic for COPD exacerbation 01/27/14 7. She will need to lose weight 8. Will need home health PT OT and oxygen  Discharge Diagnoses:  Principal Problem:   Acute on chronic respiratory failure Active Problems:   COPD (chronic obstructive pulmonary disease)   COPD with acute exacerbation   Anemia   Melena   GIB (gastrointestinal bleeding)   Leukocytosis   HTN (hypertension)   Hypothyroidism   Discharge Condition: moderate  Diet recommendation: heart healthy  Filed Weights   01/25/14 0541 01/26/14 0631 01/27/14 0508  Weight: 122.5 kg (270 lb 1 oz) 120.2 kg (264 lb 15.9 oz) 119.432 kg (263 lb 4.8 oz)    History of present illness:  80 y/o ?, known prior history COPD, Htn, hypothyroid, Breast Ca s/p Mastectomy admitted to the hospital 3/28 with a 3-4 day history shortness breath wheezing dizziness and orthostasis, in the setting of black tarry stools (on chronic NSAIDs for arthritis)  Emergency room workup = normal troponins negative Hemoglobin 8.3 INR 0.90  Admitted and worked up for acute on chronic respiratory failure-thought to be secondary to COPD exacerbation with negative flu test, the normal, echocardiogram grade 1 diastolic dysfunction-received one unit pack red blood cells 01/20/14  Noted  significant anxiety and some confabulation during hospital stay Her ileus was persistent despite passing large stool 01/24/14 per AXr done on 4/4 and GI recommended to monitor patient o/n to ensure clearance. X-rays were performed repeatedly  During hospital stay which showed distentionbut she is passing stoolwell Shortness of breath slow to resolve and she had persisting wheeze which was felt to be more anxious related than actual COPD see below   Hospital Course:   1. A/chronic respiratory failure-2/2 to Stage "c" to "d" O2 dependant COPD-taper nebulizations every 6 hours, transitioned Solu-Medrol to by mouth prednisone 60 mg for 5 more days Levaquin to PO. Will need home O2 on d/c home-she has a significant anxiety component to her COPD-because of persisting wheeze and need for when necessary nebulizations, chest x-ray was performed 4/6 which showed no evidence of infection or air trapping and this is probably her baseline 2. Anxiety-increase BuSpar to 5 mg 3 times a day-patient will need psychiatry and put his outpatient 3. Ileus-potassium actually elevated. AXR showing persisting ileus. Passing stool however. Acute abdominal series showed persisting bowel distention which has not cleared and ultimately it was thought that patient was stable to go home and she was passing stool 4. Hyperkalemia-discussed with Dr. Posey Pronto 4/3 and this is benign likely 2/2 to withdrawal of HCTZ. Potassium has decreased and is now 3.6 on day before discharge and will have to be monitored as an outpatient 5. Melena + UGIB-undetermined site-probable Upper GI-both colonoscopy/endoscopy neg-hemoglobin is very stable at present therefore we will defer management to [facility] as an outpatient 6. Hypothyroidism-continue levothyroxine 75 mcg daily 7. Iron def anemia-s/p  I U PRBC. Hb stable 8-9 range currently 8. Impaired glucose tolerance- monitor with SSI coverage-blood sugars 119   Consultants:  Gastroenterology Procedures:    Upper endoscopy 3/28  Colonoscopy 3/30 Antibiotics:  Levaquin 3/28-->4/6 Tamiflu 3/28-3/29   Discharge Exam: Filed Vitals:   01/27/14 0803  BP:   Pulse: 89  Temp:   Resp: 20   Doing fair. Next line always complaining shortness breath. Tolerating diet Some diarrhea related to laxatives   General: alert anxious oriented Cardiovascular: and S2 no murmur or gallop Respiratory: mild wheeze  Discharge Instructions You were cared for by a hospitalist during your hospital stay. If you have any questions about your discharge medications or the care you received while you were in the hospital after you are discharged, you can call the unit and asked to speak with the hospitalist on call if the hospitalist that took care of you is not available. Once you are discharged, your primary care physician will handle any further medical issues. Please note that NO REFILLS for any discharge medications will be authorized once you are discharged, as it is imperative that you return to your primary care physician (or establish a relationship with a primary care physician if you do not have one) for your aftercare needs so that they can reassess your need for medications and monitor your lab values.  Discharge Orders   Future Orders Complete By Expires   Diet - low sodium heart healthy  As directed    Discharge instructions  As directed    Comments:     Make sure you complete the steroids in another 2 days Final lung doctor and her primary care physician I encourage her to walk around and do as much activities as you can can to increase your tolerance for exercise Try to get labs in one to 2 weeks   Increase activity slowly  As directed        Medication List         albuterol 108 (90 BASE) MCG/ACT inhaler  Commonly known as:  PROVENTIL HFA;VENTOLIN HFA  Inhale 1 puff into the lungs every 6 (six) hours as needed for wheezing or shortness of breath.     albuterol (2.5 MG/3ML) 0.083%  nebulizer solution  Commonly known as:  PROVENTIL  Take 2.5 mg by nebulization every 6 (six) hours as needed for wheezing or shortness of breath.     budesonide 0.25 MG/2ML nebulizer solution  Commonly known as:  PULMICORT  Take 2 mLs (0.25 mg total) by nebulization 2 (two) times daily.     busPIRone 5 MG tablet  Commonly known as:  BUSPAR  Take 1 tablet (5 mg total) by mouth 3 (three) times daily.     cholecalciferol 1000 UNITS tablet  Commonly known as:  VITAMIN D  Take 1,000 Units by mouth daily.     dextromethorphan-guaiFENesin 30-600 MG per 12 hr tablet  Commonly known as:  MUCINEX DM  Take 2 tablets by mouth 2 (two) times daily.     Fluticasone-Salmeterol 250-50 MCG/DOSE Aepb  Commonly known as:  ADVAIR  Inhale 1 puff into the lungs 2 (two) times daily.     hydrochlorothiazide 25 MG tablet  Commonly known as:  HYDRODIURIL  Take 25 mg by mouth daily.     levothyroxine 75 MCG tablet  Commonly known as:  SYNTHROID, LEVOTHROID  Take 75 mcg by mouth daily before breakfast.     lisinopril 10 MG tablet  Commonly known as:  PRINIVIL,ZESTRIL  Take 10 mg by mouth daily.     predniSONE 20 MG tablet  Commonly known as:  DELTASONE  Take 3 tablets (60 mg total) by mouth daily before breakfast.     tiotropium 18 MCG inhalation capsule  Commonly known as:  SPIRIVA  Place 18 mcg into inhaler and inhale daily.       No Known Allergies     Follow-up Information   Follow up with [facility].   Specialty:  Pulmonology   Contact information:   [place] [address] [phone]       The results of significant diagnostics from this hospitalization (including imaging, microbiology, ancillary and laboratory) are listed below for reference.    Significant Diagnostic Studies: Ct Abdomen Pelvis W Contrast  01/21/2014   CLINICAL DATA:  Colonoscopy 01/20/2014 with persistent abdominal distention.  EXAM: CT ABDOMEN AND PELVIS WITH CONTRAST   TECHNIQUE: Multidetector CT imaging of the abdomen and pelvis was performed using the standard protocol following bolus administration of intravenous contrast.  CONTRAST:  10mL OMNIPAQUE IOHEXOL 300 MG/ML SOLN, 144mL OMNIPAQUE IOHEXOL 300 MG/ML SOLN  COMPARISON:  None.  FINDINGS: BODY WALL: Fatty epigastric midline hernia.  LOWER CHEST: Bibasilar atelectasis.  ABDOMEN/PELVIS:  Liver: No focal abnormality.  Biliary: Cholecystectomy.  No evidence of biliary obstruction.  Pancreas: Unremarkable.  Spleen: Unremarkable.  Adrenals: There is diffuse thickening of the left adrenal gland, without discrete/measurable nodule.  Kidneys and ureters: 2 cm cyst in the interpolar right kidney. Additional low-density cortical lesions bilaterally are also likely cysts, although indeterminate due to size. No hydronephrosis.  Bladder: Moderate distension.  No wall thickening.  Reproductive: Unremarkable.  Bowel: Diffuse gaseous distention of colon, most notably proximally where the cecum measures 10 cm in diameter, decreased by 1 cm from earlier today. There are diffuse fluid levels. The sigmoid colon is relatively decompressed. No pericolonic fat infiltration, pneumatosis, or perforation. Distal colonic diverticulosis. No small bowel ileus. Small duodenal diverticulum. 18 mm lipoma or fatty ingested material at the junction of the second and third portions duodenum. Negative appendix.  Retroperitoneum: No mass or adenopathy.  Peritoneum: No free fluid or gas.  Vascular: No acute abnormality.  OSSEOUS: No acute abnormalities.  IMPRESSION: 1. Colonic ileus. No progressive dilation from earlier today. No perforation. 2. Distal colonic diverticulosis.   Electronically Signed   By: Jorje Guild M.D.   On: 01/21/2014 23:42   Dg Chest Port 1 View  01/22/2014   CLINICAL DATA:  Short of breath  EXAM: PORTABLE CHEST - 1 VIEW  COMPARISON:  01/18/2014  FINDINGS: The heart is mildly enlarged. There is calcification of the thoracic aorta.  There appears to be venous hypertension with early interstitial edema. No effusions. No focal pulmonary parenchymal pathology.  IMPRESSION: Development of pulmonary venous hypertension with early interstitial edema.   Electronically Signed   By: Nelson Chimes M.D.   On: 01/22/2014 10:00   Dg Abd 2 Views  01/25/2014   CLINICAL DATA:  Abdominal distention.  EXAM: ABDOMEN - 2 VIEW  COMPARISON:  CT scan 01/21/2014.  FINDINGS: The lung bases demonstrate persistent bibasilar atelectasis and small effusions. The abdomen demonstrates persistent marked distention of the colon consistent with colonic ileus/inertia. I do not see any definite dilated small bowel loops.  IMPRESSION: Persistent marked distention of the colon consistent with colonic ileus/inertia. No findings for low colonic obstruction on the previous CT scan.   Electronically Signed   By: Kalman Jewels M.D.   On: 01/25/2014  13:36   Dg Abd 2 Views  01/21/2014   CLINICAL DATA:  Abdominal pain and distention following colonoscopy  EXAM: ABDOMEN - 2 VIEW  COMPARISON:  DG ABD ACUTE W/CHEST dated 01/18/2014  FINDINGS: There is considerable gas and fluid within small and large bowel loops throughout the abdomen. The pattern is consistent with an ileus. There is small amount of gas in the region of the rectum. As best as can be determined the stomach is not abnormally distended. There are no free extraluminal gas collections demonstrated. The bony structures exhibit no acute abnormalities.  IMPRESSION: There is a fairly large volume of gas with some fluid within loops of mildly distended small and large bowel. The pattern is consistent with an ileus. The etiology for this is not clear. The degree of colonic distention reaches at least 11 cm in greatest transverse dimension. It may be useful to consider the patient for abdominal and pelvic CT scanning.  These results were called by telephone at the time of interpretation on 01/21/2014 at 2:48 PM to Raye Sorrow, RN,  who verbally acknowledged these results.   Electronically Signed   By: David  Martinique   On: 01/21/2014 14:45   Dg Abd Acute W/chest  01/27/2014   CLINICAL DATA:  Shortness of breath. Abdominal patent. Abdominal distention.  EXAM: ACUTE ABDOMEN SERIES (ABDOMEN 2 VIEW & CHEST 1 VIEW)  COMPARISON:  DG ABD 2 VIEWS dated 01/25/2014; DG CHEST 1V PORT dated 01/22/2014; CT ABD/PELVIS W CM dated 01/21/2014; DG ABD 2 VIEWS dated 01/21/2014; DG ABD ACUTE W/CHEST dated 01/18/2014  FINDINGS: Moderate to marked dilation of the ascending and transverse colon, similar to the prior examination 4 days ago and 01/22/2012. Gas within upper normal caliber descending colon and sigmoid colon. Gas within nondistended small bowel. Colonic air-fluid levels on the lateral decubitus image consistent liquid stool. No evidence of free intraperitoneal air. Surgical clips the right upper quadrant from prior cholecystectomy. Degenerative changes involving the thoracic and lumbar spine.  Cardiac silhouette mildly enlarged, unchanged. Thoracic aorta atherosclerotic, unchanged. Hilar and mediastinal contours otherwise unremarkable. Lungs clear. Bronchovascular markings normal. Pulmonary vascularity normal. No visible pleural effusions. No pneumothorax. Azygos fissure again noted.  IMPRESSION: 1. Colonic ileus/atony, similar to the examination 4 days ago and 01/21/2014. 2. No evidence of bowel obstruction or free intraperitoneal air. 3. Colonic air-fluid levels consistent with liquid stool. 4. Mild cardiomegaly.  No acute cardiopulmonary disease.   Electronically Signed   By: Evangeline Dakin M.D.   On: 01/27/2014 10:59   Dg Abd Acute W/chest  01/18/2014   CLINICAL DATA:  Cough, shortness of breath, abdominal pain. Abdominal distention.  EXAM: ACUTE ABDOMEN SERIES (ABDOMEN 2 VIEW & CHEST 1 VIEW)  COMPARISON:  None.  FINDINGS: Mild hyperinflation of the lungs. Heart is borderline in size. No confluent airspace opacities or effusions.  Nonobstructive bowel  gas pattern. Prior cholecystectomy. Moderate stool in the colon. No free air, organomegaly or suspicious calcification.  IMPRESSION: COPD.  No active cardiopulmonary disease.  No evidence of bowel obstruction or free air. Moderate stool burden in the colon.   Electronically Signed   By: Rolm Baptise M.D.   On: 01/18/2014 16:15    Microbiology: Recent Results (from the past 240 hour(s))  URINE CULTURE     Status: None   Collection Time    01/18/14  7:20 PM      Result Value Ref Range Status   Specimen Description URINE, CLEAN CATCH   Final   Special Requests NONE  Final   Culture  Setup Time     Final   Value: 01/19/2014 01:39     Performed at [facility]     Final   Value: 75,000 COLONIES/ML     Performed at [facility]   Culture     Final   Value: Multiple bacterial morphotypes present, none predominant. Suggest appropriate recollection if clinically indicated.     Performed at [facility]   Report Status 01/20/2014 FINAL   Final  MRSA PCR SCREENING     Status: None   Collection Time    01/18/14  8:27 PM      Result Value Ref Range Status   MRSA by PCR NEGATIVE  NEGATIVE Final   Comment:            The GeneXpert MRSA Assay (FDA     approved for NASAL specimens     only), is one component of a     comprehensive MRSA colonization     surveillance program. It is not     intended to diagnose MRSA     infection nor to guide or     monitor treatment for     MRSA infections.  CULTURE, EXPECTORATED SPUTUM-ASSESSMENT     Status: None   Collection Time    01/24/14  6:12 AM      Result Value Ref Range Status   Specimen Description SPUTUM   Final   Special Requests NONE   Final   Sputum evaluation     Final   Value: THIS SPECIMEN IS ACCEPTABLE. RESPIRATORY CULTURE REPORT TO FOLLOW.   Report Status 01/24/2014 FINAL   Final  CULTURE, RESPIRATORY (NON-EXPECTORATED)     Status: None   Collection Time    01/24/14  6:12 AM      Result Value  Ref Range Status   Specimen Description SPUTUM   Final   Special Requests NONE   Final   Gram Stain     Final   Value: RARE WBC PRESENT,BOTH PMN AND MONONUCLEAR     RARE SQUAMOUS EPITHELIAL CELLS PRESENT     RARE GRAM POSITIVE COCCI IN PAIRS     RARE YEAST     Performed at [facility]   Culture     Final   Value: NORMAL OROPHARYNGEAL FLORA     Performed at [facility]   Report Status 01/26/2014 FINAL   Final     Labs: Basic Metabolic Panel:  Recent Labs Lab 01/21/14 1655 01/23/14 0355 01/23/14 1415 01/24/14 0405 01/26/14 0828  NA 140 137  --  136* 136*  K 5.5* 5.6* 5.9* 5.7* 3.6*  CL 101 101  --  101 94*  CO2 30 27  --  24 33*  GLUCOSE 131* 197*  --  203* 116*  BUN 41* 34*  --  29* 26*  CREATININE 1.07 0.88  --  0.80 0.97  CALCIUM 8.8 8.2*  --  8.4 8.9  MG 3.7*  --   --   --   --    Liver Function Tests:  Recent Labs Lab 01/23/14 0355  AST 22  ALT 25  ALKPHOS 53  BILITOT 0.5  PROT 5.9*  ALBUMIN 3.4*   No results found for this basename: LIPASE, AMYLASE,  in the last 168 hours No results found for this basename: AMMONIA,  in the last 168 hours CBC:  Recent Labs Lab 01/23/14 0355 01/24/14 0405 01/25/14 0459 01/26/14 0413  01/27/14 0400  WBC 12.6* 12.0* 11.9* 13.5* 13.3*  HGB 8.9* 9.9* 9.8* 10.8* 10.9*  HCT 28.8* 31.7* 31.3* 33.1* 34.6*  MCV 97.6 97.5 97.2 95.4 97.2  PLT 342 292 303 291 249   Cardiac Enzymes: No results found for this basename: CKTOTAL, CKMB, CKMBINDEX, TROPONINI,  in the last 168 hours BNP: BNP (last 3 results)  Recent Labs  01/18/14 1532 01/19/14 0325  PROBNP 484.2* 423.1   CBG:  Recent Labs Lab 01/22/14 0724 01/23/14 0725 01/24/14 0741 01/25/14 0800 01/26/14 0753  GLUCAP 192* 162* 158* 121* 119*       Signed:  Nita Sells  [facility] 01/27/2014, 11:18 AM

## 2014-01-27 NOTE — Progress Notes (Signed)
CSW received call from RN, Raquel Sarna that patient will need non-emergency ambulance transport home. RN confirmed home address with patient's wife. [facility] called for transport. No other CSW needs identified - CSW signing off.   Raynaldo Opitz, [facility] Clinical Social Worker cell #: [phone]

## 2014-01-27 NOTE — Progress Notes (Signed)
Pt ambulated short distance in hallway O2 sat 82% with room ambulation. Pt demonstrated increased work of breathing with room air ambulation. Back to room and placed on O2 with sats 92

## 2014-01-28 LAB — GLUCOSE, CAPILLARY: Glucose-Capillary: 128 mg/dL — ABNORMAL HIGH (ref 70–99)

## 2014-02-22 ENCOUNTER — Inpatient Hospital Stay (HOSPITAL_BASED_OUTPATIENT_CLINIC_OR_DEPARTMENT_OTHER)
Admission: EM | Admit: 2014-02-22 | Discharge: 2014-02-25 | DRG: 190 | Disposition: A | Discharge status: Home / Self Care | Payer: Medicare Other | Attending: Internal Medicine | Admitting: Internal Medicine

## 2014-02-22 ENCOUNTER — Other Ambulatory Visit: Payer: Self-pay

## 2014-02-22 ENCOUNTER — Encounter (HOSPITAL_BASED_OUTPATIENT_CLINIC_OR_DEPARTMENT_OTHER): Payer: Self-pay | Admitting: Emergency Medicine

## 2014-02-22 ENCOUNTER — Emergency Department (HOSPITAL_BASED_OUTPATIENT_CLINIC_OR_DEPARTMENT_OTHER): Payer: Medicare Other

## 2014-02-22 DIAGNOSIS — I5030 Unspecified diastolic (congestive) heart failure: Secondary | ICD-10-CM

## 2014-02-22 DIAGNOSIS — Z87891 Personal history of nicotine dependence: Secondary | ICD-10-CM

## 2014-02-22 DIAGNOSIS — I5033 Acute on chronic diastolic (congestive) heart failure: Secondary | ICD-10-CM | POA: Diagnosis present

## 2014-02-22 DIAGNOSIS — E871 Hypo-osmolality and hyponatremia: Secondary | ICD-10-CM | POA: Diagnosis not present

## 2014-02-22 DIAGNOSIS — K922 Gastrointestinal hemorrhage, unspecified: Secondary | ICD-10-CM

## 2014-02-22 DIAGNOSIS — IMO0002 Reserved for concepts with insufficient information to code with codable children: Secondary | ICD-10-CM

## 2014-02-22 DIAGNOSIS — D72829 Elevated white blood cell count, unspecified: Secondary | ICD-10-CM

## 2014-02-22 DIAGNOSIS — J9620 Acute and chronic respiratory failure, unspecified whether with hypoxia or hypercapnia: Secondary | ICD-10-CM | POA: Diagnosis present

## 2014-02-22 DIAGNOSIS — I10 Essential (primary) hypertension: Secondary | ICD-10-CM | POA: Diagnosis present

## 2014-02-22 DIAGNOSIS — E039 Hypothyroidism, unspecified: Secondary | ICD-10-CM

## 2014-02-22 DIAGNOSIS — J209 Acute bronchitis, unspecified: Secondary | ICD-10-CM

## 2014-02-22 DIAGNOSIS — Z9981 Dependence on supplemental oxygen: Secondary | ICD-10-CM

## 2014-02-22 DIAGNOSIS — K921 Melena: Secondary | ICD-10-CM

## 2014-02-22 DIAGNOSIS — Z801 Family history of malignant neoplasm of trachea, bronchus and lung: Secondary | ICD-10-CM

## 2014-02-22 DIAGNOSIS — D649 Anemia, unspecified: Secondary | ICD-10-CM | POA: Diagnosis present

## 2014-02-22 DIAGNOSIS — J9621 Acute and chronic respiratory failure with hypoxia: Secondary | ICD-10-CM | POA: Diagnosis present

## 2014-02-22 DIAGNOSIS — J441 Chronic obstructive pulmonary disease with (acute) exacerbation: Principal | ICD-10-CM | POA: Diagnosis present

## 2014-02-22 DIAGNOSIS — Z853 Personal history of malignant neoplasm of breast: Secondary | ICD-10-CM

## 2014-02-22 DIAGNOSIS — Z803 Family history of malignant neoplasm of breast: Secondary | ICD-10-CM

## 2014-02-22 DIAGNOSIS — I509 Heart failure, unspecified: Secondary | ICD-10-CM | POA: Diagnosis present

## 2014-02-22 DIAGNOSIS — J811 Chronic pulmonary edema: Secondary | ICD-10-CM

## 2014-02-22 DIAGNOSIS — J449 Chronic obstructive pulmonary disease, unspecified: Secondary | ICD-10-CM

## 2014-02-22 DIAGNOSIS — J9600 Acute respiratory failure, unspecified whether with hypoxia or hypercapnia: Secondary | ICD-10-CM

## 2014-02-22 HISTORY — DX: Unspecified diastolic (congestive) heart failure: I50.30

## 2014-02-22 LAB — CBC WITH DIFFERENTIAL/PLATELET
BAND NEUTROPHILS: 1 % (ref 0–10)
BASOS PCT: 2 % — AB (ref 0–1)
Basophils Absolute: 0.2 10*3/uL — ABNORMAL HIGH (ref 0.0–0.1)
Blasts: 0 %
EOS ABS: 0.5 10*3/uL (ref 0.0–0.7)
EOS PCT: 5 % (ref 0–5)
HCT: 34.9 % — ABNORMAL LOW (ref 36.0–46.0)
HEMOGLOBIN: 11.1 g/dL — AB (ref 12.0–15.0)
Lymphocytes Relative: 13 % (ref 12–46)
Lymphs Abs: 1.2 10*3/uL (ref 0.7–4.0)
MCH: 31.3 pg (ref 26.0–34.0)
MCHC: 31.8 g/dL (ref 30.0–36.0)
MCV: 98.3 fL (ref 78.0–100.0)
METAMYELOCYTES PCT: 1 %
MYELOCYTES: 0 %
Monocytes Absolute: 0.1 10*3/uL (ref 0.1–1.0)
Monocytes Relative: 1 % — ABNORMAL LOW (ref 3–12)
Neutro Abs: 7.3 10*3/uL (ref 1.7–7.7)
Neutrophils Relative %: 77 % (ref 43–77)
Platelets: 411 10*3/uL — ABNORMAL HIGH (ref 150–400)
Promyelocytes Absolute: 0 %
RBC: 3.55 MIL/uL — ABNORMAL LOW (ref 3.87–5.11)
RDW: 15.4 % (ref 11.5–15.5)
WBC: 9.3 10*3/uL (ref 4.0–10.5)
nRBC: 0 /100 WBC

## 2014-02-22 LAB — BASIC METABOLIC PANEL
BUN: 9 mg/dL (ref 6–23)
CO2: 29 mEq/L (ref 19–32)
Calcium: 9.4 mg/dL (ref 8.4–10.5)
Chloride: 96 mEq/L (ref 96–112)
Creatinine, Ser: 0.8 mg/dL (ref 0.50–1.10)
GFR calc Af Amer: 79 mL/min — ABNORMAL LOW (ref >90)
GFR, EST NON AFRICAN AMERICAN: 68 mL/min — AB (ref >90)
GLUCOSE: 116 mg/dL — AB (ref 70–99)
Potassium: 4.3 mEq/L (ref 3.7–5.3)
SODIUM: 139 meq/L (ref 137–147)

## 2014-02-22 LAB — I-STAT ARTERIAL BLOOD GAS, ED
ACID-BASE EXCESS: 2 mmol/L (ref 0.0–2.0)
Bicarbonate: 27.6 mEq/L — ABNORMAL HIGH (ref 20.0–24.0)
O2 SAT: 93 %
Patient temperature: 98.8
TCO2: 29 mmol/L (ref 0–100)
pCO2 arterial: 43.9 mmHg (ref 35.0–45.0)
pH, Arterial: 7.407 (ref 7.350–7.450)
pO2, Arterial: 67 mmHg — ABNORMAL LOW (ref 80.0–100.0)

## 2014-02-22 LAB — PRO B NATRIURETIC PEPTIDE: Pro B Natriuretic peptide (BNP): 383.7 pg/mL (ref 0–450)

## 2014-02-22 LAB — TROPONIN I

## 2014-02-22 MED ORDER — ALBUTEROL (5 MG/ML) CONTINUOUS INHALATION SOLN: 15.0000 mg/h | INHALATION_SOLUTION | Freq: Once | RESPIRATORY_TRACT | Status: DC

## 2014-02-22 MED ORDER — MOMETASONE FURO-FORMOTEROL FUM 100-5 MCG/ACT IN AERO: 2.0000 | INHALATION_SPRAY | Freq: Two times a day (BID) | RESPIRATORY_TRACT | Status: DC

## 2014-02-22 MED ORDER — TIOTROPIUM BROMIDE MONOHYDRATE 18 MCG IN CAPS: 18.0000 ug | ORAL_CAPSULE | Freq: Every day | RESPIRATORY_TRACT | Status: DC

## 2014-02-22 MED ORDER — LISINOPRIL 10 MG PO TABS
10.0000 mg | ORAL_TABLET | Freq: Every day | ORAL | Status: DC
  Administered 2014-02-23 – 2014-02-25 (×3): 10 mg via ORAL
  Filled 2014-02-22 (×3): qty 1

## 2014-02-22 MED ORDER — ALBUTEROL (5 MG/ML) CONTINUOUS INHALATION SOLN
10.0000 mg/h | INHALATION_SOLUTION | RESPIRATORY_TRACT | Status: AC
Start: 2014-02-22 — End: 2014-02-23
  Administered 2014-02-22: 10 mg/h via RESPIRATORY_TRACT

## 2014-02-22 MED ORDER — IPRATROPIUM-ALBUTEROL 0.5-2.5 (3) MG/3ML IN SOLN
3.0000 mL | Freq: Once | RESPIRATORY_TRACT | Status: AC
  Administered 2014-02-22: 3 mL via RESPIRATORY_TRACT
  Filled 2014-02-22: qty 3

## 2014-02-22 MED ORDER — SODIUM CHLORIDE 0.9 % IJ SOLN
3.0000 mL | Freq: Two times a day (BID) | INTRAMUSCULAR | Status: DC
  Administered 2014-02-22 – 2014-02-25 (×6): 3 mL via INTRAVENOUS

## 2014-02-22 MED ORDER — IPRATROPIUM-ALBUTEROL 0.5-2.5 (3) MG/3ML IN SOLN
3.0000 mL | RESPIRATORY_TRACT | Status: DC
  Administered 2014-02-22 – 2014-02-24 (×12): 3 mL via RESPIRATORY_TRACT
  Filled 2014-02-22 (×12): qty 3

## 2014-02-22 MED ORDER — SODIUM CHLORIDE 0.9 % IV SOLN
250.0000 mL | INTRAVENOUS | Status: DC | PRN
Start: 2014-02-22 — End: 2014-02-25

## 2014-02-22 MED ORDER — SODIUM CHLORIDE 0.9 % IJ SOLN: 3.0000 mL | INTRAMUSCULAR | Status: DC | PRN

## 2014-02-22 MED ORDER — HYDROCHLOROTHIAZIDE 25 MG PO TABS
25.0000 mg | ORAL_TABLET | Freq: Every day | ORAL | Status: DC
  Administered 2014-02-23: 25 mg via ORAL
  Filled 2014-02-22: qty 1

## 2014-02-22 MED ORDER — PANTOPRAZOLE SODIUM 40 MG PO TBEC
40.0000 mg | DELAYED_RELEASE_TABLET | Freq: Every day | ORAL | Status: DC
  Administered 2014-02-22 – 2014-02-25 (×4): 40 mg via ORAL
  Filled 2014-02-22 (×4): qty 1

## 2014-02-22 MED ORDER — ALBUTEROL SULFATE (2.5 MG/3ML) 0.083% IN NEBU
2.5000 mg | INHALATION_SOLUTION | RESPIRATORY_TRACT | Status: DC | PRN
  Administered 2014-02-22 – 2014-02-25 (×3): 2.5 mg via RESPIRATORY_TRACT
  Filled 2014-02-22 (×3): qty 3

## 2014-02-22 MED ORDER — ALBUTEROL SULFATE HFA 108 (90 BASE) MCG/ACT IN AERS: 1.0000 | INHALATION_SPRAY | Freq: Four times a day (QID) | RESPIRATORY_TRACT | Status: DC | PRN

## 2014-02-22 MED ORDER — BUDESONIDE 0.25 MG/2ML IN SUSP
0.2500 mg | Freq: Two times a day (BID) | RESPIRATORY_TRACT | Status: DC
  Administered 2014-02-22 – 2014-02-25 (×6): 0.25 mg via RESPIRATORY_TRACT
  Filled 2014-02-22 (×8): qty 2

## 2014-02-22 MED ORDER — ONDANSETRON HCL 4 MG/2ML IJ SOLN: 4.0000 mg | Freq: Four times a day (QID) | INTRAMUSCULAR | Status: DC | PRN

## 2014-02-22 MED ORDER — GUAIFENESIN-DM 100-10 MG/5ML PO SYRP
5.0000 mL | ORAL_SOLUTION | ORAL | Status: DC | PRN
  Administered 2014-02-22 – 2014-02-25 (×8): 5 mL via ORAL
  Filled 2014-02-22 (×8): qty 5

## 2014-02-22 MED ORDER — LEVOFLOXACIN 750 MG PO TABS
750.0000 mg | ORAL_TABLET | Freq: Every day | ORAL | Status: DC
  Administered 2014-02-22 – 2014-02-25 (×4): 750 mg via ORAL
  Filled 2014-02-22 (×4): qty 1

## 2014-02-22 MED ORDER — ALBUTEROL SULFATE (2.5 MG/3ML) 0.083% IN NEBU
2.5000 mg | INHALATION_SOLUTION | Freq: Once | RESPIRATORY_TRACT | Status: AC
  Administered 2014-02-22: 2.5 mg via RESPIRATORY_TRACT
  Filled 2014-02-22: qty 3

## 2014-02-22 MED ORDER — ALBUTEROL (5 MG/ML) CONTINUOUS INHALATION SOLN: 10.0000 mg/h | INHALATION_SOLUTION | Freq: Once | RESPIRATORY_TRACT | Status: DC

## 2014-02-22 MED ORDER — LEVOTHYROXINE SODIUM 75 MCG PO TABS
75.0000 ug | ORAL_TABLET | Freq: Every day | ORAL | Status: DC
  Administered 2014-02-23 – 2014-02-25 (×3): 75 ug via ORAL
  Filled 2014-02-22 (×4): qty 1

## 2014-02-22 MED ORDER — VITAMIN D3 25 MCG (1000 UNIT) PO TABS
1000.0000 [IU] | ORAL_TABLET | Freq: Every day | ORAL | Status: DC
  Administered 2014-02-23 – 2014-02-25 (×3): 1000 [IU] via ORAL
  Filled 2014-02-22 (×3): qty 1

## 2014-02-22 MED ORDER — ACETAMINOPHEN 325 MG PO TABS
650.0000 mg | ORAL_TABLET | Freq: Four times a day (QID) | ORAL | Status: DC | PRN
Start: 2014-02-22 — End: 2014-02-25
  Administered 2014-02-23 – 2014-02-25 (×2): 650 mg via ORAL
  Filled 2014-02-22 (×2): qty 2

## 2014-02-22 MED ORDER — METHYLPREDNISOLONE SODIUM SUCC 125 MG IJ SOLR
125.0000 mg | Freq: Once | INTRAMUSCULAR | Status: AC
  Administered 2014-02-22: 125 mg via INTRAVENOUS
  Filled 2014-02-22: qty 2

## 2014-02-22 MED ORDER — ONDANSETRON HCL 4 MG PO TABS: 4.0000 mg | ORAL_TABLET | Freq: Four times a day (QID) | ORAL | Status: DC | PRN

## 2014-02-22 MED ORDER — FUROSEMIDE 10 MG/ML IJ SOLN
40.0000 mg | Freq: Once | INTRAMUSCULAR | Status: AC
  Administered 2014-02-22: 40 mg via INTRAVENOUS
  Filled 2014-02-22: qty 4

## 2014-02-22 MED ORDER — ALBUTEROL (5 MG/ML) CONTINUOUS INHALATION SOLN
15.0000 mg/h | INHALATION_SOLUTION | Freq: Once | RESPIRATORY_TRACT | Status: AC
  Administered 2014-02-22: 15 mg/h via RESPIRATORY_TRACT
  Filled 2014-02-22: qty 20

## 2014-02-22 MED ORDER — ACETAMINOPHEN 650 MG RE SUPP: 650.0000 mg | Freq: Four times a day (QID) | RECTAL | Status: DC | PRN

## 2014-02-22 MED ORDER — IPRATROPIUM-ALBUTEROL 0.5-2.5 (3) MG/3ML IN SOLN: 3.0000 mL | RESPIRATORY_TRACT | Status: DC

## 2014-02-22 MED ORDER — METHYLPREDNISOLONE SODIUM SUCC 125 MG IJ SOLR
60.0000 mg | Freq: Four times a day (QID) | INTRAMUSCULAR | Status: DC
  Administered 2014-02-22 – 2014-02-25 (×12): 60 mg via INTRAVENOUS
  Filled 2014-02-22 (×15): qty 0.96

## 2014-02-22 MED ORDER — DEXTROSE 5 % IV SOLN
500.0000 mg | Freq: Once | INTRAVENOUS | Status: AC
  Administered 2014-02-22: 500 mg via INTRAVENOUS

## 2014-02-22 MED ORDER — ENOXAPARIN SODIUM 40 MG/0.4ML ~~LOC~~ SOLN
40.0000 mg | SUBCUTANEOUS | Status: DC
  Administered 2014-02-22 – 2014-02-24 (×3): 40 mg via SUBCUTANEOUS
  Filled 2014-02-22 (×4): qty 0.4

## 2014-02-22 MED ORDER — BUSPIRONE HCL 5 MG PO TABS
5.0000 mg | ORAL_TABLET | Freq: Three times a day (TID) | ORAL | Status: DC
  Administered 2014-02-22 – 2014-02-25 (×9): 5 mg via ORAL
  Filled 2014-02-22 (×10): qty 1

## 2014-02-22 MED ORDER — ALBUTEROL (5 MG/ML) CONTINUOUS INHALATION SOLN
15.0000 mg/h | INHALATION_SOLUTION | Freq: Once | RESPIRATORY_TRACT | Status: AC
  Administered 2014-02-22: 15 mg/h via RESPIRATORY_TRACT

## 2014-02-22 MED ORDER — FUROSEMIDE 40 MG PO TABS
40.0000 mg | ORAL_TABLET | Freq: Two times a day (BID) | ORAL | Status: DC
  Administered 2014-02-22: 40 mg via ORAL
  Filled 2014-02-22 (×4): qty 1

## 2014-02-22 NOTE — ED Notes (Addendum)
Pt unable to walk at this time due increased WOB.

## 2014-02-22 NOTE — H&P (Addendum)
[facility] History and Physical  Dawn Montoya NID:[id number] DOB: 08/13/1932 DOA: 02/22/2014  Referring physician: Dr Leonides Schanz PCP: No primary provider on file.   Chief Complaint:  Cough with worsening  SOB for several days  HPI:  80 y/o obese female with past medical hx of COPD on home o2 ( 2L via [place]), HTN, hypothyroidism who was admitted to the the hospital  4 weeks back for COPD exacerbation was sent from [facility] for worsening dyspnea on exertion. She reports she has not felt completely normal with her dyspnea since  Discharged from the hospital and has progressive dyspnea on exertion. She is now on 3L O2 at home and has productive cough with thick yellow sputum.she also has increased leg swelling for past 1 week. Denies fever or chills. Denies any recent travel or sick contact. Patient denies headache, dizziness,  nausea , vomiting, chest pain, palpitations,  abdominal pain, bowel or urinary symptoms. Denies change in weight or appetite. Patient recently moved from [place] to live with her daughter and does not have a PCP yet.  In the ED patient was found to be dyspneic and tachycardic. She  was wheezy on exam and given 125 mg IV solumedrol and almost 3 rounds of breathing treatment with minimal relief. CXR showed mild Insterstitial edema. Pt given a dose of IV lasix 40 gm and transferred to [place].   Review of Systems:  Constitutional: Denies fever, chills, diaphoresis, appetite change, reports  fatigue.  HEENT: Denies photophobia, eye pain, redness, hearing loss, ear pain, congestion, sore throat, rhinorrhea, sneezing, mouth sores, trouble swallowing, neck pain, Respiratory:  SOB, DOE, cough and wheezing. , denies chest tightness,    Cardiovascular: Denies chest pain, palpitations  leg swelling+.  Gastrointestinal: Denies nausea, vomiting, abdominal pain, diarrhea, constipation, blood in stool and abdominal distention.  Genitourinary: Denies dysuria, urgency, frequency,  hematuria, flank pain and difficulty urinating.  Endocrine: Denies: hot or cold intolerance, polyuria, polydipsia. Musculoskeletal: Denies myalgias, back pain, joint swelling, arthralgias and gait problem.  Skin: Denies pallor, rash and wound.  Neurological: Denies dizziness, seizures, syncope, weakness, light-headedness, numbness and headaches.  Psychiatric/Behavioral: Denies  confusion, nervousness, sleep disturbance and agitation   Past Medical History  Diagnosis Date  . COPD (chronic obstructive pulmonary disease)   . Breast CA   . Hypertension   . HTN (hypertension) 01/18/2014  . Hypothyroidism 01/18/2014  . Medical history non-contributory   . Shortness of breath   . Pneumonia    Past Surgical History  Procedure Laterality Date  . Cholecystectomy    . Breast surgery    . Mastectomy Left   . Esophagogastroduodenoscopy N/A 01/19/2014    Procedure: ESOPHAGOGASTRODUODENOSCOPY (EGD);  Surgeon: Jeryl Columbia, MD;  Location: [facility];  Service: Endoscopy;  Laterality: N/A;  . Colonoscopy N/A 01/20/2014    Procedure: COLONOSCOPY;  Surgeon: Winfield Cunas., MD;  Location: [facility];  Service: Endoscopy;  Laterality: N/A;   Social History:  reports that she quit smoking about 14 years ago. Her smoking use included Cigars, Pipe, and Cigarettes. She smoked 0.00 packs per day. She does not have any smokeless tobacco history on file. She reports that she does not drink alcohol or use illicit drugs.  No Known Allergies  Family History  Problem Relation Age of Onset  . Lung cancer Sister   . Breast cancer Sister   . Breast cancer Daughter     Prior to Admission medications   Medication Sig Start Date End Date Taking?  Authorizing Provider  albuterol (PROVENTIL HFA;VENTOLIN HFA) 108 (90 BASE) MCG/ACT inhaler Inhale 1 puff into the lungs every 6 (six) hours as needed for wheezing or shortness of breath.    Historical Provider, MD  albuterol (PROVENTIL) (2.5 MG/3ML) 0.083%  nebulizer solution Take 2.5 mg by nebulization every 6 (six) hours as needed for wheezing or shortness of breath.    Historical Provider, MD  budesonide (PULMICORT) 0.25 MG/2ML nebulizer solution Take 2 mLs (0.25 mg total) by nebulization 2 (two) times daily. 01/27/14   Nita Sells, MD  busPIRone (BUSPAR) 5 MG tablet Take 1 tablet (5 mg total) by mouth 3 (three) times daily. 01/27/14   Nita Sells, MD  cholecalciferol (VITAMIN D) 1000 UNITS tablet Take 1,000 Units by mouth daily.    Historical Provider, MD  dextromethorphan-guaiFENesin (MUCINEX DM) 30-600 MG per 12 hr tablet Take 2 tablets by mouth 2 (two) times daily. 01/27/14   Nita Sells, MD  Fluticasone-Salmeterol (ADVAIR) 250-50 MCG/DOSE AEPB Inhale 1 puff into the lungs 2 (two) times daily.    Historical Provider, MD  hydrochlorothiazide (HYDRODIURIL) 25 MG tablet Take 25 mg by mouth daily.    Historical Provider, MD  levothyroxine (SYNTHROID, LEVOTHROID) 75 MCG tablet Take 75 mcg by mouth daily before breakfast.    Historical Provider, MD  lisinopril (PRINIVIL,ZESTRIL) 10 MG tablet Take 10 mg by mouth daily.    Historical Provider, MD  predniSONE (DELTASONE) 20 MG tablet Take 3 tablets (60 mg total) by mouth daily before breakfast. 01/27/14   Nita Sells, MD  tiotropium (SPIRIVA) 18 MCG inhalation capsule Place 18 mcg into inhaler and inhale daily.    Historical Provider, MD     Physical Exam:  Filed Vitals:   02/22/14 1430 02/22/14 1451 02/22/14 1500 02/22/14 1605  BP:   140/59 155/73  Pulse: 110  110 112  Temp:    98 F (36.7 C)  TempSrc:    Oral  Resp: 24  27 24   Height:    5' 3.5" (1.613 m)  Weight:    117.2 kg (258 lb 6.1 oz)  SpO2: 95% 100% 99% 90%    Constitutional: Vital signs reviewed.  Patient is an elderly obese female in NAD HEENT: no pallor, moist oral mucosa, no JVD Cardiovascular: RRR, S1 &S2 tachycardic, no MRG Chest: CTAB, bilateral diffuse wheezes, no crackles Abdominal: Soft.  Non-tender, non-distended, bowel sounds are normal, no masses, organomegaly, or guarding present.  Ext: warm, 1+ pitting edema Neurological: A&O x3, fine hand tremors  Labs on Admission:  Basic Metabolic Panel:  Recent Labs Lab 02/22/14 1130  NA 139  K 4.3  CL 96  CO2 29  GLUCOSE 116*  BUN 9  CREATININE 0.80  CALCIUM 9.4   Liver Function Tests: No results found for this basename: AST, ALT, ALKPHOS, BILITOT, PROT, ALBUMIN,  in the last 168 hours No results found for this basename: LIPASE, AMYLASE,  in the last 168 hours No results found for this basename: AMMONIA,  in the last 168 hours CBC:  Recent Labs Lab 02/22/14 1130  WBC 9.3  NEUTROABS 7.3  HGB 11.1*  HCT 34.9*  MCV 98.3  PLT 411*   Cardiac Enzymes:  Recent Labs Lab 02/22/14 1130  TROPONINI <0.30   BNP: No components found with this basename: POCBNP,  CBG: No results found for this basename: GLUCAP,  in the last 168 hours  Radiological Exams on Admission: Dg Chest Port 1 View  02/22/2014   CLINICAL DATA:  Increasing shortness of breath.  EXAM: PORTABLE CHEST - 1 VIEW  COMPARISON:  01/27/2014  FINDINGS: Cardiac silhouette is upper limits of normal in size, unchanged. Thoracic aortic calcification is noted. The central pulmonary arteries are mildly prominent in size, unchanged. There is mild pulmonary vascular congestion. Interstitial markings are mildly increased. No confluent airspace opacity, pleural effusion, or pneumothorax is identified. No acute osseous abnormality is seen.  IMPRESSION: Mild pulmonary vascular congestion with likely mild interstitial edema.   Electronically Signed   By: Logan Bores   On: 02/22/2014 11:54    EKG: NSR@80 , no ST-T changes  Assessment/Plan  Principal Problem:   COPD with acute exacerbation Admit to telemetry. patient received 3 rounds of nebulizer and 125 mg IV Solu Medrol with minimal improvement. I will continue him on IV Solu-Medrol 60 mg every 6 hours and  scheduled duonebs, when necessary albuterol nebs and inhaler. Antitussives. continue o2 via [place] Empiric Levaquin. Send sputum for cx   Active Problems:   Acute on chronic respiratory failure due to COPD exacerbation. O2 via [place]. Mild pulmonary vascular congestion on chest x-ray.   Acute diastolic dysfn Pro BNP was normal. Recent repeat echo with normal EF and grade 1 diastolic dysfunction. Has 1+pitting edema b/l which is new per aptient. Will place on lasix bid. Monitor on tele for 24 hrs.    Anemia Recent EGD and colonoscopy normal. Hb stable    HTN (hypertension) Stable. Resume home medications    Hypothyroidism Continue Synthroid    DVT prophylaxis: Subcutaneous Lovenox  GI prophylaxis: PPI      Diet: low sodium     Code Status: full code Family Communication:None at bedside Disposition Plan: home once improved  Ali Mohl [facility] Pager [phone]  Total time spent on admission :70 minutes  If 7PM-7AM, please contact night-coverage [email or web address] Password [email or web address] 02/22/2014, 4:20 PM

## 2014-02-22 NOTE — ED Provider Notes (Signed)
TIME SEEN: 11:35 AM  CHIEF COMPLAINT: Shortness of breath, wheezing, cough with yellow sputum production, lower extremity swelling  HPI: Patient is an 80 year old female with history of COPD on 3 L of oxygen at baseline, history of breast cancer status post mastectomy, hypertension, hypothyroidism who presents to the emergency department with several days of cough with yellow sputum production shortness of breath that started this morning. She recently moved here from [place] and has been living with her daughter. She does not yet have a primary care physician. She was admitted to the hospital recently for COPD exacerbation. She denies a history of PE or DVT. She has had some lower extremity swelling bilaterally but no calf pain. She denies any fevers, chills, chest pain or chest discomfort. No history of cardiac disease or CHF.  ROS: See HPI Constitutional: no fever  Eyes: no drainage  ENT: no runny nose   Cardiovascular:  no chest pain  Resp:  SOB  GI: no vomiting GU: no dysuria Integumentary: no rash  Allergy: no hives  Musculoskeletal: no leg swelling  Neurological: no slurred speech ROS otherwise negative  PAST MEDICAL HISTORY/PAST SURGICAL HISTORY:  Past Medical History  Diagnosis Date  . COPD (chronic obstructive pulmonary disease)   . Breast CA   . Hypertension   . HTN (hypertension) 01/18/2014  . Hypothyroidism 01/18/2014  . Medical history non-contributory   . Shortness of breath   . Pneumonia     MEDICATIONS:  Prior to Admission medications   Medication Sig Start Date End Date Taking? Authorizing Provider  albuterol (PROVENTIL HFA;VENTOLIN HFA) 108 (90 BASE) MCG/ACT inhaler Inhale 1 puff into the lungs every 6 (six) hours as needed for wheezing or shortness of breath.    Historical Provider, MD  albuterol (PROVENTIL) (2.5 MG/3ML) 0.083% nebulizer solution Take 2.5 mg by nebulization every 6 (six) hours as needed for wheezing or shortness of breath.    Historical  Provider, MD  budesonide (PULMICORT) 0.25 MG/2ML nebulizer solution Take 2 mLs (0.25 mg total) by nebulization 2 (two) times daily. 01/27/14   Nita Sells, MD  busPIRone (BUSPAR) 5 MG tablet Take 1 tablet (5 mg total) by mouth 3 (three) times daily. 01/27/14   Nita Sells, MD  cholecalciferol (VITAMIN D) 1000 UNITS tablet Take 1,000 Units by mouth daily.    Historical Provider, MD  dextromethorphan-guaiFENesin (MUCINEX DM) 30-600 MG per 12 hr tablet Take 2 tablets by mouth 2 (two) times daily. 01/27/14   Nita Sells, MD  Fluticasone-Salmeterol (ADVAIR) 250-50 MCG/DOSE AEPB Inhale 1 puff into the lungs 2 (two) times daily.    Historical Provider, MD  hydrochlorothiazide (HYDRODIURIL) 25 MG tablet Take 25 mg by mouth daily.    Historical Provider, MD  levothyroxine (SYNTHROID, LEVOTHROID) 75 MCG tablet Take 75 mcg by mouth daily before breakfast.    Historical Provider, MD  lisinopril (PRINIVIL,ZESTRIL) 10 MG tablet Take 10 mg by mouth daily.    Historical Provider, MD  predniSONE (DELTASONE) 20 MG tablet Take 3 tablets (60 mg total) by mouth daily before breakfast. 01/27/14   Nita Sells, MD  tiotropium (SPIRIVA) 18 MCG inhalation capsule Place 18 mcg into inhaler and inhale daily.    Historical Provider, MD    ALLERGIES:  No Known Allergies  SOCIAL HISTORY:  History  Substance Use Topics  . Smoking status: Former Smoker    Types: Cigars, Pipe, Cigarettes    Quit date: 10/25/1999  . Smokeless tobacco: Not on file  . Alcohol Use: No  FAMILY HISTORY: Family History  Problem Relation Age of Onset  . Lung cancer Sister   . Breast cancer Sister   . Breast cancer Daughter     EXAM: Pulse 85  Temp(Src) 98.8 F (37.1 C) (Oral)  Resp 28 CONSTITUTIONAL: Alert and oriented and responds appropriately to questions. Elderly, mild respiratory distress HEAD: Normocephalic EYES: Conjunctivae clear, PERRL ENT: normal nose; no rhinorrhea; moist mucous membranes;  pharynx without lesions noted NECK: Supple, no meningismus, no LAD  CARD: RRR; S1 and S2 appreciated; no murmurs, no clicks, no rubs, no gallops RESP: Normal chest excursion without splinting, patient is tachypneic and in mild respiratory distress, she has significantly diminished breath sounds bilaterally with minimal expiratory wheeze, no rhonchi or rales ABD/GI: Normal bowel sounds; non-distended; soft, non-tender, no rebound, no guarding BACK:  The back appears normal and is non-tender to palpation, there is no CVA tenderness EXT: Normal ROM in all joints; non-tender to palpation; 1+ bilateral pitting edema to her ankles; normal capillary refill; no cyanosis    SKIN: Normal color for age and race; warm NEURO: Moves all extremities equally PSYCH: The patient's mood and manner are appropriate. Grooming and personal hygiene are appropriate.  MEDICAL DECISION MAKING: Patient here with likely COPD exacerbation versus CHF versus pneumonia. Pulmonary embolus is also on the differential but given patient has diminished breath sounds and diffuse wheezing, will start with continuous albuterol treatments, Solu-Medrol. We'll obtain cardiac labs, BMP, chest x-ray. We'll also give azithromycin for acute bronchitis.  ED PROGRESS: Patient has had 1 DuoNeb treatment in 1 continuous albuterol treatment at 15 mg with very minimal improvement. We'll give second 10 mg continuous albuterol treatment and reassess. Her labs are unremarkable. Troponin negative. BNP is normal. Chest x-ray does show mild interstitial edema and given this and her pitting edema in her bilateral lower extremity, we'll give IV Lasix.   2:00 PM  Pt able to ambulate without becoming hypoxic but she does become very short of breath and had increased work of breathing. We'll give a third continuous breathing treatment of 15 mg of albuterol. Will discuss with hospitalist and Mrs. Cannon for admission for COPD exacerbation.  2:30 PM  Spoke with  Dr. Daleen Bo for admission to inpatient, telemetry.   CRITICAL CARE Performed by: Delice Bison Ward   Total critical care time: 45 minutes  Critical care time was exclusive of separately billable procedures and treating other patients.  Critical care was necessary to treat or prevent imminent or life-threatening deterioration.  Critical care was time spent personally by me on the following activities: development of treatment plan with patient and/or surrogate as well as nursing, discussions with consultants, evaluation of patient's response to treatment, examination of patient, obtaining history from patient or surrogate, ordering and performing treatments and interventions, ordering and review of laboratory studies, ordering and review of radiographic studies, pulse oximetry and re-evaluation of patient's condition.     Date: 02/22/2014 11:31 AM  Rate: 80  Rhythm: normal sinus rhythm  QRS Axis: normal  Intervals: normal  ST/T Wave abnormalities: normal  Conduction Disutrbances: none  Narrative Interpretation: unremarkable; no ischemic changes, no arrhythmia      Delice Bison Ward, DO 02/22/14 1443

## 2014-02-22 NOTE — ED Notes (Signed)
Patient here with increasing shortness of breath since am. Reports that she had lengthy admission for her COPD in April, arrived with oxygen at 3L. Patient grunting on arrival, lower legs swollen and red

## 2014-02-22 NOTE — Progress Notes (Signed)
Patient arrived on unit, transferred from [facility].  Vital signs stable, experiencing shortness of breath at rest.  Placed on 3L via nasal cannula.  Will continue to monitor.

## 2014-02-23 DIAGNOSIS — I509 Heart failure, unspecified: Secondary | ICD-10-CM

## 2014-02-23 DIAGNOSIS — J209 Acute bronchitis, unspecified: Secondary | ICD-10-CM

## 2014-02-23 DIAGNOSIS — J9600 Acute respiratory failure, unspecified whether with hypoxia or hypercapnia: Secondary | ICD-10-CM | POA: Diagnosis present

## 2014-02-23 DIAGNOSIS — I5030 Unspecified diastolic (congestive) heart failure: Secondary | ICD-10-CM

## 2014-02-23 LAB — EXPECTORATED SPUTUM ASSESSMENT W GRAM STAIN, RFLX TO RESP C: Special Requests: NORMAL

## 2014-02-23 LAB — CBC
HEMATOCRIT: 33.3 % — AB (ref 36.0–46.0)
Hemoglobin: 10.7 g/dL — ABNORMAL LOW (ref 12.0–15.0)
MCH: 30.9 pg (ref 26.0–34.0)
MCHC: 32.1 g/dL (ref 30.0–36.0)
MCV: 96.2 fL (ref 78.0–100.0)
PLATELETS: 321 10*3/uL (ref 150–400)
RBC: 3.46 MIL/uL — ABNORMAL LOW (ref 3.87–5.11)
RDW: 15.4 % (ref 11.5–15.5)
WBC: 9.4 10*3/uL (ref 4.0–10.5)

## 2014-02-23 LAB — BASIC METABOLIC PANEL
BUN: 12 mg/dL (ref 6–23)
CALCIUM: 8.9 mg/dL (ref 8.4–10.5)
CO2: 23 meq/L (ref 19–32)
CREATININE: 0.79 mg/dL (ref 0.50–1.10)
Chloride: 92 mEq/L — ABNORMAL LOW (ref 96–112)
GFR calc Af Amer: 89 mL/min — ABNORMAL LOW (ref >90)
GFR calc non Af Amer: 77 mL/min — ABNORMAL LOW (ref >90)
Glucose, Bld: 172 mg/dL — ABNORMAL HIGH (ref 70–99)
Potassium: 4.9 mEq/L (ref 3.7–5.3)
Sodium: 134 mEq/L — ABNORMAL LOW (ref 137–147)

## 2014-02-23 LAB — EXPECTORATED SPUTUM ASSESSMENT W REFEX TO RESP CULTURE

## 2014-02-23 MED ORDER — DIPHENHYDRAMINE HCL 25 MG PO CAPS
25.0000 mg | ORAL_CAPSULE | Freq: Once | ORAL | Status: AC
  Administered 2014-02-23: 25 mg via ORAL
  Filled 2014-02-23: qty 1

## 2014-02-23 MED ORDER — LORAZEPAM 0.5 MG PO TABS
0.5000 mg | ORAL_TABLET | Freq: Once | ORAL | Status: AC
  Administered 2014-02-23: 0.5 mg via ORAL
  Filled 2014-02-23: qty 1

## 2014-02-23 NOTE — Progress Notes (Signed)
SATURATION QUALIFICATIONS: (This note is used to comply with regulatory documentation for home oxygen)  Patient Saturations on Room Air at Rest = 82%  Patient Saturations on 3 Liters of oxygen at rest = 92%  Please briefly explain why patient needs home oxygen:  Patient's oxygen saturation fell to 82% on room air at rest.  Patient required 3L of oxygen to maintain oxygen saturation of 92%.  While ambulating 134ft with oxygen at 3L, patient's saturation maintained at 92%.

## 2014-02-23 NOTE — Progress Notes (Signed)
[facility] PROGRESS NOTE Assessment/Plan: Acute on chronic respiratory failure due to COPD with exacerbation - cont IV solumedrol, antibiotics and inhalers. - check sat with ambulation.  Chronic diastolic dysfn: - Pro BNP was normal. Recent repeat echo with normal EF and grade 1 diastolic dysfunction.  - Hold lasix Bp borderline, she is becoming hyponatremic and hypochloremic. - Liberalize fluid intake. She is on 2 diuretics HCTZ and lasix.  Anemia  Recent EGD and colonoscopy normal. Hb stable   HTN (hypertension)  Stable. Resume home medications   Hypothyroidism  Continue Synthroid   Code Status: full code  Family Communication:None at bedside  Disposition Plan: home once improved   Consultants:  none  Procedures:  CXR  Antibiotics:  Levaquin  HPI/Subjective: Productive cough feels her SOB is improved  Objective: Filed Vitals:   02/23/14 0414 02/23/14 0510 02/23/14 0753 02/23/14 1032  BP:  123/41  128/60  Pulse: 84 84  94  Temp:  98 F (36.7 C)    TempSrc:  Oral    Resp: 20 18  20   Height:      Weight:  116.711 kg (257 lb 4.8 oz)    SpO2:  95% 92% 95%    Intake/Output Summary (Last 24 hours) at 02/23/14 1105 Last data filed at 02/23/14 1104  Gross per 24 hour  Intake   1083 ml  Output   1325 ml  Net   -242 ml   Filed Weights   02/22/14 1605 02/23/14 0510  Weight: 117.2 kg (258 lb 6.1 oz) 116.711 kg (257 lb 4.8 oz)    Exam:  General: Alert, awake, oriented x3, in no acute distress.  HEENT: No bruits, no goiter.  Heart: Regular rate and rhythm, without murmurs, rubs, gallops.  Lungs: Good air movement, no wheezing. Abdomen: Soft, nontender, nondistended, positive bowel sounds.  Neuro: Grossly intact, nonfocal.   Data Reviewed: Basic Metabolic Panel:  Recent Labs Lab 02/22/14 1130 02/23/14 0444  NA 139 134*  K 4.3 4.9  CL 96 92*  CO2 29 23  GLUCOSE 116* 172*  BUN 9 12  CREATININE 0.80 0.79  CALCIUM 9.4 8.9   Liver  Function Tests: No results found for this basename: AST, ALT, ALKPHOS, BILITOT, PROT, ALBUMIN,  in the last 168 hours No results found for this basename: LIPASE, AMYLASE,  in the last 168 hours No results found for this basename: AMMONIA,  in the last 168 hours CBC:  Recent Labs Lab 02/22/14 1130 02/23/14 0444  WBC 9.3 9.4  NEUTROABS 7.3  --   HGB 11.1* 10.7*  HCT 34.9* 33.3*  MCV 98.3 96.2  PLT 411* 321   Cardiac Enzymes:  Recent Labs Lab 02/22/14 1130  TROPONINI <0.30   BNP (last 3 results)  Recent Labs  01/18/14 1532 01/19/14 0325 02/22/14 1130  PROBNP 484.2* 423.1 383.7   CBG: No results found for this basename: GLUCAP,  in the last 168 hours  Recent Results (from the past 240 hour(s))  CULTURE, EXPECTORATED SPUTUM-ASSESSMENT     Status: None   Collection Time    02/23/14  3:01 AM      Result Value Ref Range Status   Specimen Description SPUTUM   Final   Special Requests Normal   Final   Sputum evaluation     Final   Value: THIS SPECIMEN IS ACCEPTABLE. RESPIRATORY CULTURE REPORT TO FOLLOW.   Report Status 02/23/2014 FINAL   Final     Studies: Dg Chest Port 1 View  02/22/2014  CLINICAL DATA:  Increasing shortness of breath.  EXAM: PORTABLE CHEST - 1 VIEW  COMPARISON:  01/27/2014  FINDINGS: Cardiac silhouette is upper limits of normal in size, unchanged. Thoracic aortic calcification is noted. The central pulmonary arteries are mildly prominent in size, unchanged. There is mild pulmonary vascular congestion. Interstitial markings are mildly increased. No confluent airspace opacity, pleural effusion, or pneumothorax is identified. No acute osseous abnormality is seen.  IMPRESSION: Mild pulmonary vascular congestion with likely mild interstitial edema.   Electronically Signed   By: Logan Bores   On: 02/22/2014 11:54    Scheduled Meds: . budesonide  0.25 mg Nebulization BID  . busPIRone  5 mg Oral TID  . cholecalciferol  1,000 Units Oral Daily  . enoxaparin  (LOVENOX) injection  40 mg Subcutaneous Q24H  . hydrochlorothiazide  25 mg Oral Daily  . ipratropium-albuterol  3 mL Nebulization Q4H  . levofloxacin  750 mg Oral Daily  . levothyroxine  75 mcg Oral QAC breakfast  . lisinopril  10 mg Oral Daily  . methylPREDNISolone (SOLU-MEDROL) injection  60 mg Intravenous Q6H  . pantoprazole  40 mg Oral Daily  . sodium chloride  3 mL Intravenous Q12H   Continuous Infusions: . albuterol Stopped (02/22/14 1410)     [facility] Pager [phone]. If 8PM-8AM, please contact night-coverage at [email or web address], password [email or web address] 02/23/2014, 11:05 AM  LOS: 1 day

## 2014-02-24 MED ORDER — IPRATROPIUM-ALBUTEROL 0.5-2.5 (3) MG/3ML IN SOLN
3.0000 mL | Freq: Four times a day (QID) | RESPIRATORY_TRACT | Status: DC
  Administered 2014-02-24 – 2014-02-25 (×4): 3 mL via RESPIRATORY_TRACT
  Filled 2014-02-24 (×4): qty 3

## 2014-02-24 NOTE — Progress Notes (Signed)
Patient resting in chair after ambulation with walker in hall with PT. Patient coughing, saturations in 80's on 3L via nasal cannula. Oxygen increased to 4L [place], patient now sating 93%. Coughing subsided, patient has no complaints at this time.  Will continue to monitor

## 2014-02-24 NOTE — Evaluation (Signed)
Physical Therapy Evaluation Patient Details Name: Dawn Montoya MRN: [id number] DOB: 1932-10-12 Today's Date: 02/24/2014   History of Present Illness  Pt is a 80 year old female admitted with a productive cough and progressive SOB for several days PTA. PMH of COPD, HTN, hypothyroid, breast cancer s/p mastectomy  Clinical Impression  Pt admitted with the above complaints. Pt currently with functional limitations due to the deficits listed below (see PT Problem List). At the time of PT eval, pt was able to ambulate 100 feet in the hallway, however O2 sats decreased and pt was limited by SOB. Pt will benefit from skilled PT to increase their independence and safety with mobility to allow discharge to the venue listed below.     Follow Up Recommendations Home health PT;Supervision/Assistance - 24 hour    Equipment Recommendations  Other (comment) (Shower chair/tub bench)    Recommendations for Other Services       Precautions / Restrictions Precautions Precautions: Fall Precaution Comments: O2 dependent Restrictions Weight Bearing Restrictions: No      Mobility  Bed Mobility               General bed mobility comments: Pt sitting EOB when PT entered. Was able to scoot around and adjust positioning on EOB with supervision.   Transfers Overall transfer level: Needs assistance Equipment used: Rolling walker (2 wheeled) Transfers: Sit to/from Stand Sit to Stand: Min guard         General transfer comment: VC's for hand placement on seated surface for safety. Pt was able to power-up to full standing with min guard assist for balance.   Ambulation/Gait Ambulation/Gait assistance: Min guard Ambulation Distance (Feet): 100 Feet Assistive device: Rolling walker (2 wheeled) Gait Pattern/deviations: Step-through pattern;Decreased stride length;Trunk flexed Gait velocity: Decreased Gait velocity interpretation: Below normal speed for age/gender General Gait Details: VC's for  pursed-lip breathing and walker positioning. 3 standing rest breaks required for pt to catch her breath.   Stairs            Wheelchair Mobility    Modified Rankin (Stroke Patients Only)       Balance Overall balance assessment: Needs assistance Sitting-balance support: Feet supported;No upper extremity supported Sitting balance-Leahy Scale: Good     Standing balance support: Bilateral upper extremity supported Standing balance-Leahy Scale: Fair                               Pertinent Vitals/Pain 93% O2 saturation at rest with 3L/min supplemental O2. During ambulation, sats decreased to 83%. Pt took standing rest break and was instructed in pursed-lip breathing. Sats continued to decrease to 72% and supplemental O2 increased to 4L/min with no change, and then increased again to 6L/min. Sats then increased to low 80's, and with seated rest break improved to 96% within 2 minutes of sitting. Pt left on 3L/min supplemental O2 with sats at 94%.     Home Living Family/patient expects to be discharged to:: Private residence Living Arrangements: Children Available Help at Discharge: Family;Available 24 hours/day Type of Home: Apartment Home Access: Stairs to enter Entrance Stairs-Rails: Right Entrance Stairs-Number of Steps: 4 Home Layout: One level Home Equipment: Walker - 2 wheels;Grab bars - tub/shower Additional Comments: O2 dependent 2L/min all the time.     Prior Function Level of Independence: Needs assistance   Gait / Transfers Assistance Needed: Uses RW all the time   ADL's / Homemaking Assistance Needed: Family assists with  bathing and getting compressing stockings on.        Hand Dominance   Dominant Hand: Right    Extremity/Trunk Assessment   Upper Extremity Assessment: Defer to OT evaluation           Lower Extremity Assessment: Generalized weakness      Cervical / Trunk Assessment: Kyphotic  Communication   Communication: HOH   Cognition Arousal/Alertness: Awake/alert Behavior During Therapy: WFL for tasks assessed/performed Overall Cognitive Status: Within Functional Limits for tasks assessed                      General Comments      Exercises        Assessment/Plan    PT Assessment Patient needs continued PT services  PT Diagnosis Difficulty walking   PT Problem List Decreased strength;Decreased range of motion;Decreased activity tolerance;Decreased balance;Decreased mobility;Decreased knowledge of use of DME;Decreased safety awareness;Decreased knowledge of precautions;Cardiopulmonary status limiting activity  PT Treatment Interventions DME instruction;Gait training;Stair training;Functional mobility training;Therapeutic activities;Therapeutic exercise;Neuromuscular re-education;Patient/family education   PT Goals (Current goals can be found in the Care Plan section) Acute Rehab PT Goals Patient Stated Goal: To return home and get better PT Goal Formulation: With patient Time For Goal Achievement: 03/10/14 Potential to Achieve Goals: Good    Frequency Min 3X/week   Barriers to discharge        Co-evaluation               End of Session Equipment Utilized During Treatment: Gait belt;Oxygen Activity Tolerance: Other (comment) (Limited by coughing and low O2 sats) Patient left: in chair;with chair alarm set;with call bell/phone within reach Nurse Communication: Mobility status         Time: 1610-9604 PT Time Calculation (min): 31 min   Charges:   PT Evaluation $Initial PT Evaluation Tier I: 1 Procedure PT Treatments $Therapeutic Activity: 23-37 mins   PT G CodesJolyn Lent 02/24/2014, 11:44 AM  Jolyn Lent, PT, DPT Acute Rehabilitation Services Pager: [phone]

## 2014-02-24 NOTE — Progress Notes (Signed)
[facility] PROGRESS NOTE Assessment/Plan: Acute on chronic respiratory failure due to COPD with exacerbation: - Cont IV steroids. - Continue antibiotics and inhalers. - Cont to sat with ambulation.  Chronic diastolic dysfn: - Pro BNP was normal. Recent repeat echo with normal EF and grade 1 diastolic dysfunction.  - D/c Lasix. - Liberalize fluid intake. She was on 2 diuretics HCTZ and lasix. - Cont to hold HCTZ.  Anemia: Recent EGD and colonoscopy normal. Hb stable   HTN (hypertension):  Stable. Resume home medications   Hypothyroidism: Continue Synthroid  Code Status: full code  Family Communication:None at bedside  Disposition Plan: home once improved   Consultants:  none  Procedures:  CXR  Antibiotics:  Levaquin  HPI/Subjective: Productive cough feels her SOB is improved  Objective: Filed Vitals:   02/24/14 0340 02/24/14 0724 02/24/14 0904 02/24/14 0935  BP:  116/48  111/47  Pulse:  86  90  Temp:  97.7 F (36.5 C)  98 F (36.7 C)  TempSrc:  Oral  Oral  Resp:  18  18  Height:      Weight:  255 lb 12.8 oz (116.03 kg)    SpO2: 91% 96% 96% 96%    Intake/Output Summary (Last 24 hours) at 02/24/14 1128 Last data filed at 02/24/14 0944  Gross per 24 hour  Intake    840 ml  Output   2200 ml  Net  -1360 ml   Filed Weights   02/22/14 1605 02/23/14 0510 02/24/14 0724  Weight: 258 lb 6.1 oz (117.2 kg) 257 lb 4.8 oz (116.711 kg) 255 lb 12.8 oz (116.03 kg)    Exam:  General: Alert, awake, oriented x3, in no acute distress.  HEENT: No bruits, no goiter.  Heart: Regular rate and rhythm, without murmurs, rubs, gallops.  Lungs: Good air movement, wheezing B/L Abdomen: Soft, nontender, nondistended, positive bowel sounds.    Data Reviewed: Basic Metabolic Panel:  Recent Labs Lab 02/22/14 1130 02/23/14 0444  NA 139 134*  K 4.3 4.9  CL 96 92*  CO2 29 23  GLUCOSE 116* 172*  BUN 9 12  CREATININE 0.80 0.79  CALCIUM 9.4 8.9   Liver  Function Tests: No results found for this basename: AST, ALT, ALKPHOS, BILITOT, PROT, ALBUMIN,  in the last 168 hours No results found for this basename: LIPASE, AMYLASE,  in the last 168 hours No results found for this basename: AMMONIA,  in the last 168 hours CBC:  Recent Labs Lab 02/22/14 1130 02/23/14 0444  WBC 9.3 9.4  NEUTROABS 7.3  --   HGB 11.1* 10.7*  HCT 34.9* 33.3*  MCV 98.3 96.2  PLT 411* 321   Cardiac Enzymes:  Recent Labs Lab 02/22/14 1130  TROPONINI <0.30   BNP (last 3 results)  Recent Labs  01/18/14 1532 01/19/14 0325 02/22/14 1130  PROBNP 484.2* 423.1 383.7   CBG: No results found for this basename: GLUCAP,  in the last 168 hours  Recent Results (from the past 240 hour(s))  CULTURE, EXPECTORATED SPUTUM-ASSESSMENT     Status: None   Collection Time    02/23/14  3:01 AM      Result Value Ref Range Status   Specimen Description SPUTUM   Final   Special Requests Normal   Final   Sputum evaluation     Final   Value: THIS SPECIMEN IS ACCEPTABLE. RESPIRATORY CULTURE REPORT TO FOLLOW.   Report Status 02/23/2014 FINAL   Final  CULTURE, RESPIRATORY (NON-EXPECTORATED)     Status:  None   Collection Time    02/23/14  3:01 AM      Result Value Ref Range Status   Specimen Description SPUTUM   Final   Special Requests ADDED 0344   Final   Gram Stain     Final   Value: MODERATE WBC PRESENT, PREDOMINANTLY PMN     FEW SQUAMOUS EPITHELIAL CELLS PRESENT     ABUNDANT GRAM POSITIVE COCCI IN PAIRS     FEW GRAM NEGATIVE RODS     MODERATE GRAM NEGATIVE COCCI   Culture     Final   Value: Culture reincubated for better growth     Performed at [facility]   Report Status PENDING   Incomplete     Studies: Dg Chest Port 1 View  02/22/2014   CLINICAL DATA:  Increasing shortness of breath.  EXAM: PORTABLE CHEST - 1 VIEW  COMPARISON:  01/27/2014  FINDINGS: Cardiac silhouette is upper limits of normal in size, unchanged. Thoracic aortic calcification is noted.  The central pulmonary arteries are mildly prominent in size, unchanged. There is mild pulmonary vascular congestion. Interstitial markings are mildly increased. No confluent airspace opacity, pleural effusion, or pneumothorax is identified. No acute osseous abnormality is seen.  IMPRESSION: Mild pulmonary vascular congestion with likely mild interstitial edema.   Electronically Signed   By: Logan Bores   On: 02/22/2014 11:54    Scheduled Meds: . budesonide  0.25 mg Nebulization BID  . busPIRone  5 mg Oral TID  . cholecalciferol  1,000 Units Oral Daily  . enoxaparin (LOVENOX) injection  40 mg Subcutaneous Q24H  . ipratropium-albuterol  3 mL Nebulization Q4H  . levofloxacin  750 mg Oral Daily  . levothyroxine  75 mcg Oral QAC breakfast  . lisinopril  10 mg Oral Daily  . methylPREDNISolone (SOLU-MEDROL) injection  60 mg Intravenous Q6H  . pantoprazole  40 mg Oral Daily  . sodium chloride  3 mL Intravenous Q12H   Continuous Infusions:      Ivor Costa, MD PGY3, Internal Medicine Teaching Service Pager: [phone]   [facility] Pager [phone]. If 8PM-8AM, please contact night-coverage at [email or web address], password [facility] 02/24/2014, 11:28 AM  LOS: 2 days

## 2014-02-25 DIAGNOSIS — J449 Chronic obstructive pulmonary disease, unspecified: Secondary | ICD-10-CM

## 2014-02-25 LAB — BASIC METABOLIC PANEL
BUN: 30 mg/dL — ABNORMAL HIGH (ref 6–23)
CHLORIDE: 96 meq/L (ref 96–112)
CO2: 30 meq/L (ref 19–32)
Calcium: 8.7 mg/dL (ref 8.4–10.5)
Creatinine, Ser: 0.84 mg/dL (ref 0.50–1.10)
GFR calc Af Amer: 74 mL/min — ABNORMAL LOW (ref >90)
GFR, EST NON AFRICAN AMERICAN: 64 mL/min — AB (ref >90)
GLUCOSE: 199 mg/dL — AB (ref 70–99)
POTASSIUM: 4.6 meq/L (ref 3.7–5.3)
SODIUM: 139 meq/L (ref 137–147)

## 2014-02-25 LAB — CULTURE, RESPIRATORY W GRAM STAIN: Culture: NORMAL

## 2014-02-25 MED ORDER — BUSPIRONE HCL 5 MG PO TABS: 5.0000 mg | ORAL_TABLET | Freq: Three times a day (TID) | ORAL | Status: DC

## 2014-02-25 MED ORDER — LEVOFLOXACIN 500 MG PO TABS: 500.0000 mg | ORAL_TABLET | Freq: Every day | ORAL | Status: DC

## 2014-02-25 MED ORDER — TIOTROPIUM BROMIDE MONOHYDRATE 18 MCG IN CAPS: 18.0000 ug | ORAL_CAPSULE | Freq: Every day | RESPIRATORY_TRACT | Status: DC

## 2014-02-25 MED ORDER — PREDNISONE 10 MG PO TABS: ORAL_TABLET | ORAL | Status: DC

## 2014-02-25 NOTE — Progress Notes (Signed)
-   I have reviewed the plan and made changes as needed. I have discussed the plan with the resident. - Single-lumen oral steroids, antibiotics and inhalers. We'll need a followup with her primary care doctor and to 4 weeks. - She was on 2 diuretics on admission. She will resume back her hydrochlorothiazide I have discussed these changes with the patient.

## 2014-02-25 NOTE — Plan of Care (Signed)
Problem: Phase I Progression Outcomes Goal: Dyspnea controlled at rest Outcome: Progressing Short of breath with exertion.  Oxygen dependent at home.  Wheezing has decreased since admission.  Able to sit up for periods of time.

## 2014-02-25 NOTE — Discharge Summary (Signed)
Physician Discharge Summary  Dawn Montoya [id number] DOB: [birth year] DOA: 02/22/2014  PCP: No primary provider on file.  Admit date: 02/22/2014 Discharge date: 02/25/2014  Time spent: 35 minutes  Recommendations for Outpatient Follow-up:  1. Follow with Dr. Melvyn Novas in 2 - 4 weeks.   Discharge Diagnoses:  Principal Problem:   COPD with acute exacerbation Active Problems:   Acute on chronic respiratory failure   Anemia   HTN (hypertension)   Hypothyroidism   COPD with exacerbation   Diastolic CHF   Acute respiratory failure   Discharge Condition: stable  Diet recommendation: low sodium diet  Filed Weights   02/23/14 0510 02/24/14 0724 02/25/14 0547  Weight: 116.711 kg (257 lb 4.8 oz) 116.03 kg (255 lb 12.8 oz) 115.88 kg (255 lb 7.5 oz)    History of present illness:  80 y/o obese female with past medical hx of COPD on home o2 ( 2L via [place]), HTN, hypothyroidism who was admitted to the the hospital 4 weeks back for COPD exacerbation was sent [facility] for worsening dyspnea on exertion. She reports she has not felt completely normal with her dyspnea since Discharged from the hospital and has progressive dyspnea on exertion. She is now on 3L O2 at home and has productive cough with thick yellow sputum.she also has increased leg swelling for past 1 week. Denies fever or chills. Denies any recent travel or sick contact. Patient denies headache, dizziness, nausea , vomiting, chest pain, palpitations, abdominal pain, bowel or urinary symptoms. Denies change in weight or appetite.  Patient recently moved from [place] to live with her daughter and does not have a PCP yet.      Hospital Course:  Acute on chronic respiratory failure due to COPD with exacerbation:  - Started on IV steroids, antibiotics and inhalers on admission. - once her sats remain stable with ambulation, they were change to orals. - Continue antibiotics as and outpatient for 4 days and inhalers.   Chronic  diastolic dysfn:  - Pro BNP was normal. Recent repeat echo with normal EF and grade 1 diastolic dysfunction.  - She was on 2 diuretics HCTZ and lasix.  - Resumed as and outpatient.  Anemia:  Recent EGD and colonoscopy normal. Hb stable.  HTN (hypertension):  Stable. Resume home medications   Hypothyroidism:  Continue Synthroid  Procedures:  CXR  Consultations:  none  Discharge Exam: Filed Vitals:   02/25/14 1050  BP: 134/45  Pulse:   Temp:   Resp:     General: A&O x3 Cardiovascular: rrr Respiratory: good air movement CTA B/L  Discharge Instructions You were cared for by a hospitalist during your hospital stay. If you have any questions about your discharge medications or the care you received while you were in the hospital after you are discharged, you can call the unit and asked to speak with the hospitalist on call if the hospitalist that took care of you is not available. Once you are discharged, your primary care physician will handle any further medical issues. Please note that NO REFILLS for any discharge medications will be authorized once you are discharged, as it is imperative that you return to your primary care physician (or establish a relationship with a primary care physician if you do not have one) for your aftercare needs so that they can reassess your need for medications and monitor your lab values.     Medication List         albuterol 108 (90 BASE) MCG/ACT  inhaler  Commonly known as:  PROVENTIL HFA;VENTOLIN HFA  Inhale 2 puffs into the lungs every 6 (six) hours as needed for wheezing or shortness of breath.     albuterol (2.5 MG/3ML) 0.083% nebulizer solution  Commonly known as:  PROVENTIL  Take 2.5 mg by nebulization every 6 (six) hours as needed for wheezing or shortness of breath.     budesonide 0.25 MG/2ML nebulizer solution  Commonly known as:  PULMICORT  Take 0.25 mg by nebulization 2 (two) times daily.     busPIRone 5 MG tablet   Commonly known as:  BUSPAR  Take 1 tablet (5 mg total) by mouth 3 (three) times daily.     cholecalciferol 1000 UNITS tablet  Commonly known as:  VITAMIN D  Take 1,000 Units by mouth daily.     Fluticasone-Salmeterol 250-50 MCG/DOSE Aepb  Commonly known as:  ADVAIR  Inhale 1 puff into the lungs 2 (two) times daily.     hydrochlorothiazide 25 MG tablet  Commonly known as:  HYDRODIURIL  Take 25 mg by mouth daily.     levofloxacin 500 MG tablet  Commonly known as:  LEVAQUIN  Take 1 tablet (500 mg total) by mouth daily.     levothyroxine 75 MCG tablet  Commonly known as:  SYNTHROID, LEVOTHROID  Take 75 mcg by mouth daily before breakfast.     lisinopril 10 MG tablet  Commonly known as:  PRINIVIL,ZESTRIL  Take 10 mg by mouth daily.     multivitamin with minerals tablet  Take 1 tablet by mouth daily.     predniSONE 10 MG tablet  Commonly known as:  DELTASONE  Takes 6 tablets for 2 days, then 5 tablets for 2 days, then 4 tablets for 2 days, then 3 tablets for 2 days, then 2 tabs for 2 days, then 1 tab for 2 days, and then stop.     tiotropium 18 MCG inhalation capsule  Commonly known as:  SPIRIVA  Place 18 mcg into inhaler and inhale daily.     tiotropium 18 MCG inhalation capsule  Commonly known as:  SPIRIVA HANDIHALER  Place 1 capsule (18 mcg total) into inhaler and inhale daily.       No Known Allergies Follow-up Information   Follow up with Christinia Gully, MD In 1 week. (hospital follow up)    Specialty:  Pulmonary Disease   Contact information:   [address] [place] [address] [phone]        The results of significant diagnostics from this hospitalization (including imaging, microbiology, ancillary and laboratory) are listed below for reference.    Significant Diagnostic Studies: Dg Chest Port 1 View  02/22/2014   CLINICAL DATA:  Increasing shortness of breath.  EXAM: PORTABLE CHEST - 1 VIEW  COMPARISON:  01/27/2014  FINDINGS: Cardiac silhouette  is upper limits of normal in size, unchanged. Thoracic aortic calcification is noted. The central pulmonary arteries are mildly prominent in size, unchanged. There is mild pulmonary vascular congestion. Interstitial markings are mildly increased. No confluent airspace opacity, pleural effusion, or pneumothorax is identified. No acute osseous abnormality is seen.  IMPRESSION: Mild pulmonary vascular congestion with likely mild interstitial edema.   Electronically Signed   By: Logan Bores   On: 02/22/2014 11:54   Dg Abd Acute W/chest  01/27/2014   CLINICAL DATA:  Shortness of breath. Abdominal patent. Abdominal distention.  EXAM: ACUTE ABDOMEN SERIES (ABDOMEN 2 VIEW & CHEST 1 VIEW)  COMPARISON:  DG ABD 2 VIEWS dated 01/25/2014;  DG CHEST 1V PORT dated 01/22/2014; CT ABD/PELVIS W CM dated 01/21/2014; DG ABD 2 VIEWS dated 01/21/2014; DG ABD ACUTE W/CHEST dated 01/18/2014  FINDINGS: Moderate to marked dilation of the ascending and transverse colon, similar to the prior examination 4 days ago and 01/22/2012. Gas within upper normal caliber descending colon and sigmoid colon. Gas within nondistended small bowel. Colonic air-fluid levels on the lateral decubitus image consistent liquid stool. No evidence of free intraperitoneal air. Surgical clips the right upper quadrant from prior cholecystectomy. Degenerative changes involving the thoracic and lumbar spine.  Cardiac silhouette mildly enlarged, unchanged. Thoracic aorta atherosclerotic, unchanged. Hilar and mediastinal contours otherwise unremarkable. Lungs clear. Bronchovascular markings normal. Pulmonary vascularity normal. No visible pleural effusions. No pneumothorax. Azygos fissure again noted.  IMPRESSION: 1. Colonic ileus/atony, similar to the examination 4 days ago and 01/21/2014. 2. No evidence of bowel obstruction or free intraperitoneal air. 3. Colonic air-fluid levels consistent with liquid stool. 4. Mild cardiomegaly.  No acute cardiopulmonary disease.    Electronically Signed   By: Evangeline Dakin M.D.   On: 01/27/2014 10:59    Microbiology: Recent Results (from the past 240 hour(s))  CULTURE, EXPECTORATED SPUTUM-ASSESSMENT     Status: None   Collection Time    02/23/14  3:01 AM      Result Value Ref Range Status   Specimen Description SPUTUM   Final   Special Requests Normal   Final   Sputum evaluation     Final   Value: THIS SPECIMEN IS ACCEPTABLE. RESPIRATORY CULTURE REPORT TO FOLLOW.   Report Status 02/23/2014 FINAL   Final  CULTURE, RESPIRATORY (NON-EXPECTORATED)     Status: None   Collection Time    02/23/14  3:01 AM      Result Value Ref Range Status   Specimen Description SPUTUM   Final   Special Requests ADDED 0344   Final   Gram Stain     Final   Value: MODERATE WBC PRESENT, PREDOMINANTLY PMN     FEW SQUAMOUS EPITHELIAL CELLS PRESENT     ABUNDANT GRAM POSITIVE COCCI IN PAIRS     FEW GRAM NEGATIVE RODS     MODERATE GRAM NEGATIVE COCCI   Culture     Final   Value: NORMAL OROPHARYNGEAL FLORA     Performed at [facility]   Report Status 02/25/2014 FINAL   Final     Labs: Basic Metabolic Panel:  Recent Labs Lab 02/22/14 1130 02/23/14 0444 02/25/14 0455  NA 139 134* 139  K 4.3 4.9 4.6  CL 96 92* 96  CO2 29 23 30   GLUCOSE 116* 172* 199*  BUN 9 12 30*  CREATININE 0.80 0.79 0.84  CALCIUM 9.4 8.9 8.7   Liver Function Tests: No results found for this basename: AST, ALT, ALKPHOS, BILITOT, PROT, ALBUMIN,  in the last 168 hours No results found for this basename: LIPASE, AMYLASE,  in the last 168 hours No results found for this basename: AMMONIA,  in the last 168 hours CBC:  Recent Labs Lab 02/22/14 1130 02/23/14 0444  WBC 9.3 9.4  NEUTROABS 7.3  --   HGB 11.1* 10.7*  HCT 34.9* 33.3*  MCV 98.3 96.2  PLT 411* 321   Cardiac Enzymes:  Recent Labs Lab 02/22/14 1130  TROPONINI <0.30   BNP: BNP (last 3 results)  Recent Labs  01/18/14 1532 01/19/14 0325 02/22/14 1130  PROBNP 484.2* 423.1  383.7   CBG: No results found for this basename: GLUCAP,  in the last 168  hours     Signed:  Charlynne Cousins  [facility] 02/25/2014, 1:38 PM

## 2014-02-25 NOTE — Progress Notes (Addendum)
SATURATION QUALIFICATIONS: (This note is used to comply with regulatory documentation for home oxygen)  Patient Saturations on Room Air at Rest = 88%  Patient Saturations on Room Air while Ambulating = 88%  Patient Saturations on 2 Liters of oxygen while Ambulating = 96-100%  Please briefly explain why patient needs home oxygen: client saturations drop below 90 without oxygen with minimal exertion.  MD is aware.  Client does not want to go home after being ambulated in hall.  Shortness of breath is not new onset.  MD notified and client is to be discharged.    19  Dr. Olevia Bowens spoke with client's granddaughter in detail about discharge.  She will be coming to pick her up between 5-7 pm today per MD.

## 2014-02-25 NOTE — Progress Notes (Signed)
Patient discharged to home with daughter and granddaughter.  Patient's granddaughter did not bring oxygen to the hospital with her for the patient to be discharged with, so [facility] representative notified and tank delivered to patient.  Discharge instructions, education, and medications discussed with patient and granddaughter by charge RN prior to discharge; patient and granddaughter voiced understanding of discharge information.

## 2014-02-25 NOTE — Progress Notes (Signed)
[facility] PROGRESS NOTE Assessment/Plan: Acute on chronic respiratory failure due to COPD with exacerbation: - Cont IV steroids. - Continue antibiotics and inhalers. - Check O2 Sat with ambulation.  Chronic diastolic dysfn: - Pro BNP was normal. Recent repeat echo with normal EF and grade 1 diastolic dysfunction.  - D/c'ed Lasix-->has good urine output (1350 cc)-->has negative I/O of -1913 since admission - Liberalize fluid intake. She was on 2 diuretics HCTZ and lasix-->Cont to hold HCTZ  Anemia: Recent EGD and colonoscopy normal. Hb stable   HTN (hypertension):  Stable. Resume home medications   Hypothyroidism: Continue Synthroid  Code Status: full code  Family Communication:None at bedside  Disposition Plan: home once improved   Consultants:  none  Procedures:  CXR  Antibiotics:  Levaquin  HPI/Subjective: Productive cough, SOB is improving  Objective: Filed Vitals:   02/24/14 2002 02/25/14 0028 02/25/14 0547 02/25/14 0829  BP: 119/64 122/86 123/48   Pulse: 80 82 85   Temp: 98.1 F (36.7 C) 98 F (36.7 C) 98.3 F (36.8 C)   TempSrc: Oral Oral Oral   Resp: 22 24 24    Height:      Weight:   255 lb 7.5 oz (115.88 kg)   SpO2:  98% 92% 93%    Intake/Output Summary (Last 24 hours) at 02/25/14 0847 Last data filed at 02/25/14 0643  Gross per 24 hour  Intake   1200 ml  Output   1351 ml  Net   -151 ml   Filed Weights   02/23/14 0510 02/24/14 0724 02/25/14 0547  Weight: 257 lb 4.8 oz (116.711 kg) 255 lb 12.8 oz (116.03 kg) 255 lb 7.5 oz (115.88 kg)    Exam:  General: Alert, awake, oriented x3, in no acute distress.  HEENT: No bruits, no goiter.  Heart: Regular rate and rhythm, without murmurs, rubs, gallops.  Lungs: Good air movement, without wheezing, with rhonchi B/L Abdomen: Soft, nontender, nondistended, positive bowel sounds.    Data Reviewed: Basic Metabolic Panel:  Recent Labs Lab 02/22/14 1130 02/23/14 0444  02/25/14 0455  NA 139 134* 139  K 4.3 4.9 4.6  CL 96 92* 96  CO2 29 23 30   GLUCOSE 116* 172* 199*  BUN 9 12 30*  CREATININE 0.80 0.79 0.84  CALCIUM 9.4 8.9 8.7   Liver Function Tests: No results found for this basename: AST, ALT, ALKPHOS, BILITOT, PROT, ALBUMIN,  in the last 168 hours No results found for this basename: LIPASE, AMYLASE,  in the last 168 hours No results found for this basename: AMMONIA,  in the last 168 hours CBC:  Recent Labs Lab 02/22/14 1130 02/23/14 0444  WBC 9.3 9.4  NEUTROABS 7.3  --   HGB 11.1* 10.7*  HCT 34.9* 33.3*  MCV 98.3 96.2  PLT 411* 321   Cardiac Enzymes:  Recent Labs Lab 02/22/14 1130  TROPONINI <0.30   BNP (last 3 results)  Recent Labs  01/18/14 1532 01/19/14 0325 02/22/14 1130  PROBNP 484.2* 423.1 383.7   CBG: No results found for this basename: GLUCAP,  in the last 168 hours  Recent Results (from the past 240 hour(s))  CULTURE, EXPECTORATED SPUTUM-ASSESSMENT     Status: None   Collection Time    02/23/14  3:01 AM      Result Value Ref Range Status   Specimen Description SPUTUM   Final   Special Requests Normal   Final   Sputum evaluation     Final   Value: THIS SPECIMEN IS ACCEPTABLE.  RESPIRATORY CULTURE REPORT TO FOLLOW.   Report Status 02/23/2014 FINAL   Final  CULTURE, RESPIRATORY (NON-EXPECTORATED)     Status: None   Collection Time    02/23/14  3:01 AM      Result Value Ref Range Status   Specimen Description SPUTUM   Final   Special Requests ADDED 0344   Final   Gram Stain     Final   Value: MODERATE WBC PRESENT, PREDOMINANTLY PMN     FEW SQUAMOUS EPITHELIAL CELLS PRESENT     ABUNDANT GRAM POSITIVE COCCI IN PAIRS     FEW GRAM NEGATIVE RODS     MODERATE GRAM NEGATIVE COCCI   Culture     Final   Value: Culture reincubated for better growth     Performed at [facility]   Report Status PENDING   Incomplete     Studies: No results found.  Scheduled Meds: . budesonide  0.25 mg Nebulization BID   . busPIRone  5 mg Oral TID  . cholecalciferol  1,000 Units Oral Daily  . enoxaparin (LOVENOX) injection  40 mg Subcutaneous Q24H  . ipratropium-albuterol  3 mL Nebulization QID  . levofloxacin  750 mg Oral Daily  . levothyroxine  75 mcg Oral QAC breakfast  . lisinopril  10 mg Oral Daily  . methylPREDNISolone (SOLU-MEDROL) injection  60 mg Intravenous Q6H  . pantoprazole  40 mg Oral Daily  . sodium chloride  3 mL Intravenous Q12H   Continuous Infusions:      Ivor Costa, MD PGY3, Internal Medicine Teaching Service Pager: [phone]   [facility] Pager [phone]. If 8PM-8AM, please contact night-coverage at [email or web address], password [facility] 02/25/2014, 8:47 AM  LOS: 3 days

## 2014-03-05 ENCOUNTER — Inpatient Hospital Stay: Payer: Medicare Other | Admitting: Internal Medicine

## 2014-03-28 ENCOUNTER — Inpatient Hospital Stay: Payer: Medicare Other | Admitting: Internal Medicine

## 2014-10-24 ENCOUNTER — Inpatient Hospital Stay (HOSPITAL_COMMUNITY)
Admission: EM | Admit: 2014-10-24 | Discharge: 2014-10-31 | DRG: 291 | Disposition: A | Discharge status: Skilled Nursing Facility | Payer: Medicare Other | Attending: Internal Medicine | Admitting: Internal Medicine

## 2014-10-24 ENCOUNTER — Emergency Department (HOSPITAL_COMMUNITY): Payer: Medicare Other

## 2014-10-24 ENCOUNTER — Encounter (HOSPITAL_COMMUNITY): Payer: Self-pay | Admitting: Emergency Medicine

## 2014-10-24 DIAGNOSIS — R0603 Acute respiratory distress: Secondary | ICD-10-CM

## 2014-10-24 DIAGNOSIS — I248 Other forms of acute ischemic heart disease: Secondary | ICD-10-CM | POA: Diagnosis present

## 2014-10-24 DIAGNOSIS — Z9049 Acquired absence of other specified parts of digestive tract: Secondary | ICD-10-CM | POA: Diagnosis present

## 2014-10-24 DIAGNOSIS — I5033 Acute on chronic diastolic (congestive) heart failure: Secondary | ICD-10-CM | POA: Diagnosis present

## 2014-10-24 DIAGNOSIS — K5900 Constipation, unspecified: Secondary | ICD-10-CM | POA: Diagnosis present

## 2014-10-24 DIAGNOSIS — E039 Hypothyroidism, unspecified: Secondary | ICD-10-CM | POA: Diagnosis not present

## 2014-10-24 DIAGNOSIS — I110 Hypertensive heart disease with heart failure: Secondary | ICD-10-CM | POA: Insufficient documentation

## 2014-10-24 DIAGNOSIS — Z87891 Personal history of nicotine dependence: Secondary | ICD-10-CM

## 2014-10-24 DIAGNOSIS — I10 Essential (primary) hypertension: Secondary | ICD-10-CM | POA: Diagnosis present

## 2014-10-24 DIAGNOSIS — R739 Hyperglycemia, unspecified: Secondary | ICD-10-CM | POA: Diagnosis present

## 2014-10-24 DIAGNOSIS — Z9114 Patient's other noncompliance with medication regimen: Secondary | ICD-10-CM | POA: Diagnosis present

## 2014-10-24 DIAGNOSIS — J9601 Acute respiratory failure with hypoxia: Secondary | ICD-10-CM | POA: Diagnosis not present

## 2014-10-24 DIAGNOSIS — Z853 Personal history of malignant neoplasm of breast: Secondary | ICD-10-CM

## 2014-10-24 DIAGNOSIS — Z9981 Dependence on supplemental oxygen: Secondary | ICD-10-CM | POA: Diagnosis not present

## 2014-10-24 DIAGNOSIS — J811 Chronic pulmonary edema: Secondary | ICD-10-CM

## 2014-10-24 DIAGNOSIS — Z9012 Acquired absence of left breast and nipple: Secondary | ICD-10-CM | POA: Diagnosis present

## 2014-10-24 DIAGNOSIS — J329 Chronic sinusitis, unspecified: Secondary | ICD-10-CM | POA: Diagnosis present

## 2014-10-24 DIAGNOSIS — F419 Anxiety disorder, unspecified: Secondary | ICD-10-CM | POA: Diagnosis present

## 2014-10-24 DIAGNOSIS — R0602 Shortness of breath: Secondary | ICD-10-CM | POA: Diagnosis present

## 2014-10-24 DIAGNOSIS — J9600 Acute respiratory failure, unspecified whether with hypoxia or hypercapnia: Secondary | ICD-10-CM | POA: Diagnosis present

## 2014-10-24 DIAGNOSIS — J441 Chronic obstructive pulmonary disease with (acute) exacerbation: Secondary | ICD-10-CM | POA: Diagnosis not present

## 2014-10-24 LAB — COMPREHENSIVE METABOLIC PANEL
ALBUMIN: 3.8 g/dL (ref 3.5–5.2)
ALT: 40 U/L — AB (ref 0–35)
ANION GAP: 10 (ref 5–15)
AST: 42 U/L — ABNORMAL HIGH (ref 0–37)
Alkaline Phosphatase: 91 U/L (ref 39–117)
BILIRUBIN TOTAL: 1 mg/dL (ref 0.3–1.2)
BUN: 26 mg/dL — ABNORMAL HIGH (ref 6–23)
CHLORIDE: 96 meq/L (ref 96–112)
CO2: 27 mmol/L (ref 19–32)
CREATININE: 0.84 mg/dL (ref 0.50–1.10)
Calcium: 8.7 mg/dL (ref 8.4–10.5)
GFR, EST AFRICAN AMERICAN: 74 mL/min — AB (ref >90)
GFR, EST NON AFRICAN AMERICAN: 64 mL/min — AB (ref >90)
Glucose, Bld: 136 mg/dL — ABNORMAL HIGH (ref 70–99)
POTASSIUM: 4.4 mmol/L (ref 3.5–5.1)
SODIUM: 133 mmol/L — AB (ref 135–145)
TOTAL PROTEIN: 7.5 g/dL (ref 6.0–8.3)

## 2014-10-24 LAB — CBC WITH DIFFERENTIAL/PLATELET
Basophils Absolute: 0 10*3/uL (ref 0.0–0.1)
Basophils Relative: 0 % (ref 0–1)
Eosinophils Absolute: 0 10*3/uL (ref 0.0–0.7)
Eosinophils Relative: 0 % (ref 0–5)
HCT: 41.6 % (ref 36.0–46.0)
Hemoglobin: 13.3 g/dL (ref 12.0–15.0)
LYMPHS ABS: 2.9 10*3/uL (ref 0.7–4.0)
LYMPHS PCT: 20 % (ref 12–46)
MCH: 28.7 pg (ref 26.0–34.0)
MCHC: 32 g/dL (ref 30.0–36.0)
MCV: 89.7 fL (ref 78.0–100.0)
Monocytes Absolute: 1 10*3/uL (ref 0.1–1.0)
Monocytes Relative: 7 % (ref 3–12)
Neutro Abs: 10.4 10*3/uL — ABNORMAL HIGH (ref 1.7–7.7)
Neutrophils Relative %: 73 % (ref 43–77)
Platelets: 270 10*3/uL (ref 150–400)
RBC: 4.64 MIL/uL (ref 3.87–5.11)
RDW: 15.1 % (ref 11.5–15.5)
WBC: 14.3 10*3/uL — AB (ref 4.0–10.5)

## 2014-10-24 LAB — URINE MICROSCOPIC-ADD ON

## 2014-10-24 LAB — URINALYSIS, ROUTINE W REFLEX MICROSCOPIC
Bilirubin Urine: NEGATIVE
Glucose, UA: NEGATIVE mg/dL
Ketones, ur: NEGATIVE mg/dL
Leukocytes, UA: NEGATIVE
NITRITE: NEGATIVE
PROTEIN: 30 mg/dL — AB
Specific Gravity, Urine: 1.016 (ref 1.005–1.030)
Urobilinogen, UA: 1 mg/dL (ref 0.0–1.0)
pH: 5 (ref 5.0–8.0)

## 2014-10-24 LAB — BLOOD GAS, ARTERIAL
Acid-Base Excess: 0.4 mmol/L (ref 0.0–2.0)
BICARBONATE: 28.6 meq/L — AB (ref 20.0–24.0)
Drawn by: 232811
FIO2: 1 %
LHR: 14 {breaths}/min
O2 Saturation: 99.1 %
PCO2 ART: 65.7 mmHg — AB (ref 35.0–45.0)
PEEP: 5 cmH2O
PH ART: 7.26 — AB (ref 7.350–7.450)
Patient temperature: 98
TCO2: 26.6 mmol/L (ref 0–100)
pO2, Arterial: 492 mmHg — ABNORMAL HIGH (ref 80.0–100.0)

## 2014-10-24 LAB — I-STAT TROPONIN, ED: TROPONIN I, POC: 0.08 ng/mL (ref 0.00–0.08)

## 2014-10-24 LAB — TROPONIN I: Troponin I: 0.1 ng/mL — ABNORMAL HIGH (ref <0.031)

## 2014-10-24 MED ORDER — NITROGLYCERIN IN D5W 200-5 MCG/ML-% IV SOLN
0.0000 ug/min | Freq: Once | INTRAVENOUS | Status: AC
  Administered 2014-10-24: 10 ug/min via INTRAVENOUS
  Filled 2014-10-24: qty 250

## 2014-10-24 MED ORDER — ALBUTEROL SULFATE (2.5 MG/3ML) 0.083% IN NEBU
5.0000 mg | INHALATION_SOLUTION | Freq: Once | RESPIRATORY_TRACT | Status: AC
  Administered 2014-10-24: 5 mg via RESPIRATORY_TRACT
  Filled 2014-10-24: qty 6

## 2014-10-24 MED ORDER — NITROGLYCERIN 0.4 MG SL SUBL: 0.4000 mg | SUBLINGUAL_TABLET | SUBLINGUAL | Status: DC | PRN

## 2014-10-24 MED ORDER — FUROSEMIDE 10 MG/ML IJ SOLN
40.0000 mg | Freq: Once | INTRAMUSCULAR | Status: AC
  Administered 2014-10-24: 40 mg via INTRAVENOUS
  Filled 2014-10-24: qty 4

## 2014-10-24 MED ORDER — IPRATROPIUM BROMIDE 0.02 % IN SOLN
0.5000 mg | Freq: Once | RESPIRATORY_TRACT | Status: AC
  Administered 2014-10-24: 0.5 mg via RESPIRATORY_TRACT
  Filled 2014-10-24: qty 2.5

## 2014-10-24 MED ORDER — ALBUTEROL SULFATE (2.5 MG/3ML) 0.083% IN NEBU: 5.0000 mg | INHALATION_SOLUTION | RESPIRATORY_TRACT | Status: DC | PRN

## 2014-10-24 NOTE — ED Notes (Addendum)
Per EMS , pt. From home with complaint of SOB with some wheezing which started at 8pm this eveneing., pt. Has COPD , HTN , pt. Alert and oriented x3. Pt. Received 125mg  solu medrol IV x1 , albuterol neb treatment on board EMS x 2, , also received 0.3 mg  Epi IV x1. Pt. Arrived in ED with acute  respiratory distress.

## 2014-10-24 NOTE — ED Provider Notes (Signed)
CSN: [id number]     Arrival date & time 10/24/14  2055 History   First MD Initiated Contact with Patient 10/24/14 2116     Chief Complaint  Patient presents with  . COPD  . Hypertension  . Shortness of Breath     (Consider location/radiation/quality/duration/timing/severity/associated sxs/prior Treatment) Patient is a 81 y.o. female presenting with hypertension and shortness of breath. The history is provided by the patient and the EMS personnel. The history is limited by the condition of the patient.  Hypertension Associated symptoms include shortness of breath.  Shortness of Breath pt with hx htn, copd, chf, presents in severe respiratory distress by ems.  Pt w severe dyspnea and is unable to answer most all questions asked - level 5 caveat due to severe resp distress. Denies cp or fever.   Past Medical History  Diagnosis Date  . COPD (chronic obstructive pulmonary disease)   . Breast CA   . Hypertension   . HTN (hypertension) 01/18/2014  . Hypothyroidism 01/18/2014  . Medical history non-contributory   . Shortness of breath   . Pneumonia   . Diastolic CHF 11/25/252   Past Surgical History  Procedure Laterality Date  . Cholecystectomy    . Breast surgery    . Mastectomy Left   . Esophagogastroduodenoscopy N/A 01/19/2014    Procedure: ESOPHAGOGASTRODUODENOSCOPY (EGD);  Surgeon: Jeryl Columbia, MD;  Location: [facility];  Service: Endoscopy;  Laterality: N/A;  . Colonoscopy N/A 01/20/2014    Procedure: COLONOSCOPY;  Surgeon: Winfield Cunas., MD;  Location: [facility];  Service: Endoscopy;  Laterality: N/A;   Family History  Problem Relation Age of Onset  . Lung cancer Sister   . Breast cancer Sister   . Breast cancer Daughter    History  Substance Use Topics  . Smoking status: Former Smoker    Types: Cigars, Pipe, Cigarettes    Quit date: 10/25/1999  . Smokeless tobacco: Not on file  . Alcohol Use: No   OB History    No data available       Review of  Systems  Unable to perform ROS: Severe respiratory distress  Respiratory: Positive for shortness of breath.   level 5 caveat    Allergies  Review of patient's allergies indicates no known allergies.  Home Medications   Prior to Admission medications   Medication Sig Start Date End Date Taking? Authorizing Provider  albuterol (PROVENTIL HFA;VENTOLIN HFA) 108 (90 BASE) MCG/ACT inhaler Inhale 2 puffs into the lungs every 6 (six) hours as needed for wheezing or shortness of breath.     Historical Provider, MD  albuterol (PROVENTIL) (2.5 MG/3ML) 0.083% nebulizer solution Take 2.5 mg by nebulization every 6 (six) hours as needed for wheezing or shortness of breath.    Historical Provider, MD  budesonide (PULMICORT) 0.25 MG/2ML nebulizer solution Take 0.25 mg by nebulization 2 (two) times daily.    Historical Provider, MD  busPIRone (BUSPAR) 5 MG tablet Take 1 tablet (5 mg total) by mouth 3 (three) times daily. 02/25/14   Charlynne Cousins, MD  cholecalciferol (VITAMIN D) 1000 UNITS tablet Take 1,000 Units by mouth daily.    Historical Provider, MD  Fluticasone-Salmeterol (ADVAIR) 250-50 MCG/DOSE AEPB Inhale 1 puff into the lungs 2 (two) times daily.    Historical Provider, MD  hydrochlorothiazide (HYDRODIURIL) 25 MG tablet Take 25 mg by mouth daily.    Historical Provider, MD  levofloxacin (LEVAQUIN) 500 MG tablet Take 1 tablet (500 mg total) by mouth daily. 02/25/14  Charlynne Cousins, MD  levothyroxine (SYNTHROID, LEVOTHROID) 75 MCG tablet Take 75 mcg by mouth daily before breakfast.    Historical Provider, MD  lisinopril (PRINIVIL,ZESTRIL) 10 MG tablet Take 10 mg by mouth daily.    Historical Provider, MD  Multiple Vitamins-Minerals (MULTIVITAMIN WITH MINERALS) tablet Take 1 tablet by mouth daily.    Historical Provider, MD  predniSONE (DELTASONE) 10 MG tablet Takes 6 tablets for 2 days, then 5 tablets for 2 days, then 4 tablets for 2 days, then 3 tablets for 2 days, then 2 tabs for 2 days,  then 1 tab for 2 days, and then stop. 02/25/14   Charlynne Cousins, MD  tiotropium (SPIRIVA HANDIHALER) 18 MCG inhalation capsule Place 1 capsule (18 mcg total) into inhaler and inhale daily. 02/25/14   Charlynne Cousins, MD  tiotropium (SPIRIVA) 18 MCG inhalation capsule Place 18 mcg into inhaler and inhale daily.    Historical Provider, MD   BP 195/116 mmHg  Pulse 98  Resp 36  SpO2 98% Physical Exam  Constitutional: She appears well-developed and well-nourished. She appears distressed.  Severe resp distress  HENT:  Head: Atraumatic.  Mouth/Throat: Oropharynx is clear and moist.  Eyes: Conjunctivae are normal. Pupils are equal, round, and reactive to light. No scleral icterus.  Neck: Neck supple. No tracheal deviation present.  Cardiovascular: Normal rate, regular rhythm, normal heart sounds and intact distal pulses.   Pulmonary/Chest: She is in respiratory distress.  Severe resp distress, rales basis, wheezing.   Abdominal: Soft. Normal appearance and bowel sounds are normal. She exhibits no distension. There is no tenderness.  obese  Genitourinary:  No cva tenderness  Musculoskeletal: She exhibits edema. She exhibits no tenderness.  Neurological: She is alert.  Skin: Skin is warm and dry. No rash noted. She is not diaphoretic.  Psychiatric:  anxious  Nursing note and vitals reviewed.   ED Course  Procedures (including critical care time) Labs Review   Results for orders placed or performed during the hospital encounter of 10/24/14  CBC with Differential  Result Value Ref Range   WBC 14.3 (H) 4.0 - 10.5 K/uL   RBC 4.64 3.87 - 5.11 MIL/uL   Hemoglobin 13.3 12.0 - 15.0 g/dL   HCT 41.6 36.0 - 46.0 %   MCV 89.7 78.0 - 100.0 fL   MCH 28.7 26.0 - 34.0 pg   MCHC 32.0 30.0 - 36.0 g/dL   RDW 15.1 11.5 - 15.5 %   Platelets 270 150 - 400 K/uL   Neutrophils Relative % 73 43 - 77 %   Neutro Abs 10.4 (H) 1.7 - 7.7 K/uL   Lymphocytes Relative 20 12 - 46 %   Lymphs Abs 2.9 0.7 -  4.0 K/uL   Monocytes Relative 7 3 - 12 %   Monocytes Absolute 1.0 0.1 - 1.0 K/uL   Eosinophils Relative 0 0 - 5 %   Eosinophils Absolute 0.0 0.0 - 0.7 K/uL   Basophils Relative 0 0 - 1 %   Basophils Absolute 0.0 0.0 - 0.1 K/uL  Comprehensive metabolic panel  Result Value Ref Range   Sodium 133 (L) 135 - 145 mmol/L   Potassium 4.4 3.5 - 5.1 mmol/L   Chloride 96 96 - 112 mEq/L   CO2 27 19 - 32 mmol/L   Glucose, Bld 136 (H) 70 - 99 mg/dL   BUN 26 (H) 6 - 23 mg/dL   Creatinine, Ser 0.84 0.50 - 1.10 mg/dL   Calcium 8.7 8.4 - 10.5 mg/dL  Total Protein 7.5 6.0 - 8.3 g/dL   Albumin 3.8 3.5 - 5.2 g/dL   AST 42 (H) 0 - 37 U/L   ALT 40 (H) 0 - 35 U/L   Alkaline Phosphatase 91 39 - 117 U/L   Total Bilirubin 1.0 0.3 - 1.2 mg/dL   GFR calc non Af Amer 64 (L) >90 mL/min   GFR calc Af Amer 74 (L) >90 mL/min   Anion gap 10 5 - 15  Troponin I  Result Value Ref Range   Troponin I 0.10 (H) <0.031 ng/mL  Blood gas, arterial  Result Value Ref Range   FIO2 1.00 %   Delivery systems NIV/PC MODE ON VENT    Mode NIV/PC MODE ON VENT    Rate 14 resp/min   Peep/cpap 5.0 cm H20   Pressure control PC 5 ABOVE PEEP cm H20   pH, Arterial 7.260 (L) 7.350 - 7.450   pCO2 arterial 65.7 (HH) 35.0 - 45.0 mmHg   pO2, Arterial 492.0 (H) 80.0 - 100.0 mmHg   Bicarbonate 28.6 (H) 20.0 - 24.0 mEq/L   TCO2 26.6 0 - 100 mmol/L   Acid-Base Excess 0.4 0.0 - 2.0 mmol/L   O2 Saturation 99.1 %   Patient temperature 98.0    Collection site RIGHT RADIAL    Drawn by [id number]    Sample type ARTERIAL    Allens test (pass/fail) PASS PASS  Urinalysis, Routine w reflex microscopic  Result Value Ref Range   Color, Urine YELLOW YELLOW   APPearance CLOUDY (A) CLEAR   Specific Gravity, Urine 1.016 1.005 - 1.030   pH 5.0 5.0 - 8.0   Glucose, UA NEGATIVE NEGATIVE mg/dL   Hgb urine dipstick TRACE (A) NEGATIVE   Bilirubin Urine NEGATIVE NEGATIVE   Ketones, ur NEGATIVE NEGATIVE mg/dL   Protein, ur 30 (A) NEGATIVE mg/dL    Urobilinogen, UA 1.0 0.0 - 1.0 mg/dL   Nitrite NEGATIVE NEGATIVE   Leukocytes, UA NEGATIVE NEGATIVE  Urine microscopic-add on  Result Value Ref Range   WBC, UA 0-2 <3 WBC/hpf   Bacteria, UA FEW (A) RARE   Casts HYALINE CASTS (A) NEGATIVE   Urine-Other AMORPHOUS URATES/PHOSPHATES   I-stat troponin, ED  Result Value Ref Range   Troponin i, poc 0.08 0.00 - 0.08 ng/mL   Comment NOTIFIED PHYSICIAN    Comment 3           Dg Chest Portable 1 View  10/24/2014   CLINICAL DATA:  Respiratory distress. Patient is unable to communicate.  EXAM: PORTABLE CHEST - 1 VIEW  COMPARISON:  02/22/2014  FINDINGS: Normal heart size. Mild prominence of pulmonary vascularity may indicate mild vascular congestion. No blunting of costophrenic angles. No airspace disease. No pneumothorax. Azygos lobe. Calcified and tortuous aorta.  IMPRESSION: Mild prominence of pulmonary vascularity suggesting mild congestion. Heart size is normal. No focal airspace disease.   Electronically Signed   By: Lucienne Capers M.D.   On: 10/24/2014 21:19        EKG Interpretation   Date/Time:  Friday October 24 2014 20:58:21 EST Ventricular Rate:  96 PR Interval:  141 QRS Duration: 104 QT Interval:  446 QTC Calculation: 564 R Axis:   82 Text Interpretation:  Sinus rhythm Poor data quality No significant change  since last tracing Confirmed by Ashok Cordia  MD, Lennette Bihari ([id number]) on 10/24/2014  9:17:25 PM      MDM   Iv ns. Continuous pulse ox and monitor. o2 fm.  Bipap.   Initial bp very  high, sl ntg, ntg gtt.  Albuterol and atrovent neb. Iv steroids given by ems.   Reviewed nursing notes and prior charts for additional history.   Additional albuterol and atrovent nebs.   abg noted, po2 very high - resp therapy had placed pt on bipap, 100% o2, will have continue bipap room air, and adjust settings.   Recheck, pt more awake and alert. resp distress slowly improving. bp improved from initial. Lasix iv.   hospitalist paged for  admission to step down unit.  CRITICAL CARE  RE acute on chronic respiratory failure, copd, chf, uncontrolled htn,  Performed by: Mirna Mires Total critical care time: 45 Critical care time was exclusive of separately billable procedures and treating other patients. Critical care was necessary to treat or prevent imminent or life-threatening deterioration. Critical care was time spent personally by me on the following activities: development of treatment plan with patient and/or surrogate as well as nursing, discussions with consultants, evaluation of patient's response to treatment, examination of patient, obtaining history from patient or surrogate, ordering and performing treatments and interventions, ordering and review of laboratory studies, ordering and review of radiographic studies, pulse oximetry and re-evaluation of patient's condition.      Mirna Mires, MD 10/24/14 2325

## 2014-10-24 NOTE — Progress Notes (Addendum)
After weaning pt to 21% fio2 on bipap per md order, patient's O2 sats dropped to 82%.  Fio2 increased back to 30%, O2 sats now 92-94%, rn aware.

## 2014-10-24 NOTE — ED Notes (Signed)
Patient arrives in acute respiratory distress unable to speak/neb in progress from EMS-RT at bedside to place on bipap

## 2014-10-25 ENCOUNTER — Encounter (HOSPITAL_COMMUNITY): Payer: Self-pay | Admitting: Internal Medicine

## 2014-10-25 DIAGNOSIS — I10 Essential (primary) hypertension: Secondary | ICD-10-CM | POA: Diagnosis present

## 2014-10-25 DIAGNOSIS — I5033 Acute on chronic diastolic (congestive) heart failure: Secondary | ICD-10-CM | POA: Diagnosis present

## 2014-10-25 DIAGNOSIS — J9601 Acute respiratory failure with hypoxia: Secondary | ICD-10-CM | POA: Diagnosis present

## 2014-10-25 DIAGNOSIS — J441 Chronic obstructive pulmonary disease with (acute) exacerbation: Secondary | ICD-10-CM

## 2014-10-25 LAB — BLOOD GAS, ARTERIAL
Acid-Base Excess: 4.9 mmol/L — ABNORMAL HIGH (ref 0.0–2.0)
Bicarbonate: 30.2 mEq/L — ABNORMAL HIGH (ref 20.0–24.0)
FIO2: 0.3 %
LHR: 14 {breaths}/min
MODE: POSITIVE
O2 Saturation: 95.5 %
PEEP/CPAP: 5 cmH2O
Patient temperature: 36.9
TCO2: 27.3 mmol/L (ref 0–100)
pCO2 arterial: 49.6 mmHg — ABNORMAL HIGH (ref 35.0–45.0)
pH, Arterial: 7.401 (ref 7.350–7.450)
pO2, Arterial: 76.1 mmHg — ABNORMAL LOW (ref 80.0–100.0)

## 2014-10-25 LAB — COMPREHENSIVE METABOLIC PANEL
ALBUMIN: 3.5 g/dL (ref 3.5–5.2)
ALK PHOS: 82 U/L (ref 39–117)
ALT: 36 U/L — ABNORMAL HIGH (ref 0–35)
ANION GAP: 12 (ref 5–15)
AST: 35 U/L (ref 0–37)
BILIRUBIN TOTAL: 0.6 mg/dL (ref 0.3–1.2)
BUN: 27 mg/dL — ABNORMAL HIGH (ref 6–23)
CALCIUM: 8.3 mg/dL — AB (ref 8.4–10.5)
CO2: 29 mmol/L (ref 19–32)
CREATININE: 0.87 mg/dL (ref 0.50–1.10)
Chloride: 93 mEq/L — ABNORMAL LOW (ref 96–112)
GFR calc Af Amer: 71 mL/min — ABNORMAL LOW (ref >90)
GFR calc non Af Amer: 61 mL/min — ABNORMAL LOW (ref >90)
Glucose, Bld: 204 mg/dL — ABNORMAL HIGH (ref 70–99)
POTASSIUM: 4 mmol/L (ref 3.5–5.1)
Sodium: 134 mmol/L — ABNORMAL LOW (ref 135–145)
Total Protein: 7 g/dL (ref 6.0–8.3)

## 2014-10-25 LAB — CBC WITH DIFFERENTIAL/PLATELET
Basophils Absolute: 0 10*3/uL (ref 0.0–0.1)
Basophils Relative: 0 % (ref 0–1)
EOS ABS: 0 10*3/uL (ref 0.0–0.7)
EOS PCT: 0 % (ref 0–5)
HCT: 37.3 % (ref 36.0–46.0)
HEMOGLOBIN: 12 g/dL (ref 12.0–15.0)
LYMPHS PCT: 4 % — AB (ref 12–46)
Lymphs Abs: 0.4 10*3/uL — ABNORMAL LOW (ref 0.7–4.0)
MCH: 28.7 pg (ref 26.0–34.0)
MCHC: 32.2 g/dL (ref 30.0–36.0)
MCV: 89.2 fL (ref 78.0–100.0)
Monocytes Absolute: 0.1 10*3/uL (ref 0.1–1.0)
Monocytes Relative: 1 % — ABNORMAL LOW (ref 3–12)
NEUTROS ABS: 9.7 10*3/uL — AB (ref 1.7–7.7)
Neutrophils Relative %: 95 % — ABNORMAL HIGH (ref 43–77)
PLATELETS: 215 10*3/uL (ref 150–400)
RBC: 4.18 MIL/uL (ref 3.87–5.11)
RDW: 15 % (ref 11.5–15.5)
WBC: 10.2 10*3/uL (ref 4.0–10.5)

## 2014-10-25 LAB — GLUCOSE, CAPILLARY
Glucose-Capillary: 130 mg/dL — ABNORMAL HIGH (ref 70–99)
Glucose-Capillary: 153 mg/dL — ABNORMAL HIGH (ref 70–99)
Glucose-Capillary: 193 mg/dL — ABNORMAL HIGH (ref 70–99)
Glucose-Capillary: 175 mg/dL — ABNORMAL HIGH (ref 70–99)

## 2014-10-25 LAB — BRAIN NATRIURETIC PEPTIDE: B NATRIURETIC PEPTIDE 5: 492.5 pg/mL — AB (ref 0.0–100.0)

## 2014-10-25 LAB — TROPONIN I
TROPONIN I: 0.1 ng/mL — AB (ref <0.031)
TROPONIN I: 0.06 ng/mL — AB (ref <0.031)
Troponin I: 0.09 ng/mL — ABNORMAL HIGH (ref <0.031)

## 2014-10-25 LAB — TSH: TSH: 1.718 u[IU]/mL (ref 0.350–4.500)

## 2014-10-25 LAB — MRSA PCR SCREENING: MRSA by PCR: POSITIVE — AB

## 2014-10-25 MED ORDER — INSULIN ASPART 100 UNIT/ML ~~LOC~~ SOLN: 0.0000 [IU] | Freq: Every day | SUBCUTANEOUS | Status: DC

## 2014-10-25 MED ORDER — IPRATROPIUM BROMIDE 0.02 % IN SOLN: 0.5000 mg | RESPIRATORY_TRACT | Status: DC

## 2014-10-25 MED ORDER — ALBUTEROL SULFATE (2.5 MG/3ML) 0.083% IN NEBU
2.5000 mg | INHALATION_SOLUTION | Freq: Four times a day (QID) | RESPIRATORY_TRACT | Status: DC
  Administered 2014-10-25 – 2014-10-27 (×9): 2.5 mg via RESPIRATORY_TRACT
  Filled 2014-10-25 (×8): qty 3

## 2014-10-25 MED ORDER — ARFORMOTEROL TARTRATE 15 MCG/2ML IN NEBU
15.0000 ug | INHALATION_SOLUTION | Freq: Two times a day (BID) | RESPIRATORY_TRACT | Status: DC
  Administered 2014-10-25 – 2014-10-31 (×13): 15 ug via RESPIRATORY_TRACT
  Filled 2014-10-25 (×15): qty 2

## 2014-10-25 MED ORDER — IPRATROPIUM BROMIDE 0.02 % IN SOLN
0.5000 mg | Freq: Four times a day (QID) | RESPIRATORY_TRACT | Status: DC
  Administered 2014-10-25: 0.5 mg via RESPIRATORY_TRACT
  Filled 2014-10-25: qty 2.5

## 2014-10-25 MED ORDER — METHYLPREDNISOLONE SODIUM SUCC 125 MG IJ SOLR
80.0000 mg | Freq: Four times a day (QID) | INTRAMUSCULAR | Status: AC
  Administered 2014-10-25 (×2): 80 mg via INTRAVENOUS
  Filled 2014-10-25 (×2): qty 2

## 2014-10-25 MED ORDER — BUSPIRONE HCL 5 MG PO TABS
5.0000 mg | ORAL_TABLET | Freq: Three times a day (TID) | ORAL | Status: DC
  Administered 2014-10-25 – 2014-10-26 (×4): 5 mg via ORAL
  Filled 2014-10-25 (×7): qty 1

## 2014-10-25 MED ORDER — POTASSIUM CHLORIDE CRYS ER 20 MEQ PO TBCR
20.0000 meq | EXTENDED_RELEASE_TABLET | Freq: Every day | ORAL | Status: DC
  Administered 2014-10-25 – 2014-10-31 (×7): 20 meq via ORAL
  Filled 2014-10-25 (×7): qty 1

## 2014-10-25 MED ORDER — BUDESONIDE 0.5 MG/2ML IN SUSP
0.5000 mg | Freq: Two times a day (BID) | RESPIRATORY_TRACT | Status: DC
  Administered 2014-10-26 – 2014-10-31 (×11): 0.5 mg via RESPIRATORY_TRACT
  Filled 2014-10-25 (×11): qty 2

## 2014-10-25 MED ORDER — PREDNISONE 20 MG PO TABS: 40.0000 mg | ORAL_TABLET | Freq: Every day | ORAL | Status: DC

## 2014-10-25 MED ORDER — INSULIN ASPART 100 UNIT/ML ~~LOC~~ SOLN
0.0000 [IU] | Freq: Three times a day (TID) | SUBCUTANEOUS | Status: DC
  Administered 2014-10-25: 2 [IU] via SUBCUTANEOUS
  Administered 2014-10-25 (×2): 3 [IU] via SUBCUTANEOUS
  Administered 2014-10-26 – 2014-10-27 (×4): 2 [IU] via SUBCUTANEOUS
  Administered 2014-10-27 – 2014-10-28 (×2): 3 [IU] via SUBCUTANEOUS
  Administered 2014-10-28 (×2): 2 [IU] via SUBCUTANEOUS
  Administered 2014-10-29: 5 [IU] via SUBCUTANEOUS
  Administered 2014-10-29 (×2): 2 [IU] via SUBCUTANEOUS
  Administered 2014-10-30: 5 [IU] via SUBCUTANEOUS
  Administered 2014-10-30: 3 [IU] via SUBCUTANEOUS
  Administered 2014-10-31 (×2): 2 [IU] via SUBCUTANEOUS
  Administered 2014-10-31: 3 [IU] via SUBCUTANEOUS

## 2014-10-25 MED ORDER — OXYCODONE HCL 5 MG PO TABS
5.0000 mg | ORAL_TABLET | ORAL | Status: DC | PRN
  Administered 2014-10-25 – 2014-10-29 (×4): 5 mg via ORAL
  Filled 2014-10-25 (×4): qty 1

## 2014-10-25 MED ORDER — CETYLPYRIDINIUM CHLORIDE 0.05 % MT LIQD
7.0000 mL | Freq: Two times a day (BID) | OROMUCOSAL | Status: DC
  Administered 2014-10-25 – 2014-10-31 (×11): 7 mL via OROMUCOSAL

## 2014-10-25 MED ORDER — ACETAMINOPHEN 325 MG PO TABS
650.0000 mg | ORAL_TABLET | Freq: Four times a day (QID) | ORAL | Status: DC | PRN
  Administered 2014-10-25 – 2014-10-26 (×2): 650 mg via ORAL
  Filled 2014-10-25 (×2): qty 2

## 2014-10-25 MED ORDER — IPRATROPIUM-ALBUTEROL 0.5-2.5 (3) MG/3ML IN SOLN: 3.0000 mL | RESPIRATORY_TRACT | Status: DC

## 2014-10-25 MED ORDER — LISINOPRIL 10 MG PO TABS
10.0000 mg | ORAL_TABLET | Freq: Every day | ORAL | Status: DC
  Administered 2014-10-25 – 2014-10-31 (×7): 10 mg via ORAL
  Filled 2014-10-25 (×8): qty 1

## 2014-10-25 MED ORDER — ASPIRIN EC 325 MG PO TBEC
325.0000 mg | DELAYED_RELEASE_TABLET | Freq: Every day | ORAL | Status: DC
  Administered 2014-10-25 – 2014-10-31 (×7): 325 mg via ORAL
  Filled 2014-10-25 (×7): qty 1

## 2014-10-25 MED ORDER — BUDESONIDE 0.25 MG/2ML IN SUSP: 0.2500 mg | Freq: Two times a day (BID) | RESPIRATORY_TRACT | Status: DC

## 2014-10-25 MED ORDER — SODIUM CHLORIDE 0.9 % IJ SOLN
3.0000 mL | Freq: Two times a day (BID) | INTRAMUSCULAR | Status: DC
  Administered 2014-10-25 – 2014-10-31 (×10): 3 mL via INTRAVENOUS

## 2014-10-25 MED ORDER — TRAMADOL HCL 50 MG PO TABS
50.0000 mg | ORAL_TABLET | Freq: Once | ORAL | Status: AC
  Administered 2014-10-25: 50 mg via ORAL
  Filled 2014-10-25: qty 1

## 2014-10-25 MED ORDER — ASPIRIN EC 325 MG PO TBEC: 325.0000 mg | DELAYED_RELEASE_TABLET | Freq: Every day | ORAL | Status: DC

## 2014-10-25 MED ORDER — FUROSEMIDE 10 MG/ML IJ SOLN
40.0000 mg | Freq: Every day | INTRAMUSCULAR | Status: DC
  Administered 2014-10-25: 40 mg via INTRAVENOUS
  Filled 2014-10-25 (×2): qty 4

## 2014-10-25 MED ORDER — TIOTROPIUM BROMIDE MONOHYDRATE 18 MCG IN CAPS
18.0000 ug | ORAL_CAPSULE | Freq: Every day | RESPIRATORY_TRACT | Status: DC
  Administered 2014-10-25 – 2014-10-31 (×6): 18 ug via RESPIRATORY_TRACT
  Filled 2014-10-25 (×3): qty 5

## 2014-10-25 MED ORDER — ALBUTEROL SULFATE (2.5 MG/3ML) 0.083% IN NEBU
2.5000 mg | INHALATION_SOLUTION | RESPIRATORY_TRACT | Status: DC | PRN
  Administered 2014-10-26 – 2014-10-28 (×2): 2.5 mg via RESPIRATORY_TRACT
  Filled 2014-10-25 (×3): qty 3

## 2014-10-25 MED ORDER — NITROGLYCERIN IN D5W 200-5 MCG/ML-% IV SOLN
0.0000 ug/min | Freq: Once | INTRAVENOUS | Status: AC
  Administered 2014-10-25: 20 ug/min via INTRAVENOUS

## 2014-10-25 MED ORDER — ENOXAPARIN SODIUM 40 MG/0.4ML ~~LOC~~ SOLN
40.0000 mg | SUBCUTANEOUS | Status: DC
  Administered 2014-10-25 – 2014-10-31 (×7): 40 mg via SUBCUTANEOUS
  Filled 2014-10-25 (×7): qty 0.4

## 2014-10-25 MED ORDER — HYDROCHLOROTHIAZIDE 25 MG PO TABS: 25.0000 mg | ORAL_TABLET | Freq: Every day | ORAL | Status: DC

## 2014-10-25 MED ORDER — LEVOFLOXACIN IN D5W 750 MG/150ML IV SOLN
750.0000 mg | INTRAVENOUS | Status: DC
  Administered 2014-10-25 – 2014-10-28 (×4): 750 mg via INTRAVENOUS
  Filled 2014-10-25 (×4): qty 150

## 2014-10-25 MED ORDER — BUDESONIDE 0.25 MG/2ML IN SUSP
0.5000 mg | Freq: Two times a day (BID) | RESPIRATORY_TRACT | Status: DC
  Administered 2014-10-25: 0.5 mg via RESPIRATORY_TRACT
  Filled 2014-10-25: qty 4

## 2014-10-25 MED ORDER — ONDANSETRON HCL 4 MG/2ML IJ SOLN: 4.0000 mg | Freq: Four times a day (QID) | INTRAMUSCULAR | Status: DC | PRN

## 2014-10-25 MED ORDER — DEXMEDETOMIDINE HCL IN NACL 200 MCG/50ML IV SOLN
0.4000 ug/kg/h | INTRAVENOUS | Status: DC
  Filled 2014-10-25: qty 50

## 2014-10-25 MED ORDER — LEVOTHYROXINE SODIUM 75 MCG PO TABS
75.0000 ug | ORAL_TABLET | Freq: Every day | ORAL | Status: DC
  Administered 2014-10-25 – 2014-10-31 (×7): 75 ug via ORAL
  Filled 2014-10-25 (×7): qty 1

## 2014-10-25 MED ORDER — ACETAMINOPHEN 650 MG RE SUPP: 650.0000 mg | Freq: Four times a day (QID) | RECTAL | Status: DC | PRN

## 2014-10-25 MED ORDER — ALBUTEROL SULFATE (2.5 MG/3ML) 0.083% IN NEBU
2.5000 mg | INHALATION_SOLUTION | RESPIRATORY_TRACT | Status: DC
  Administered 2014-10-25: 2.5 mg via RESPIRATORY_TRACT
  Filled 2014-10-25: qty 3

## 2014-10-25 MED ORDER — ONDANSETRON HCL 4 MG PO TABS: 4.0000 mg | ORAL_TABLET | Freq: Four times a day (QID) | ORAL | Status: DC | PRN

## 2014-10-25 MED ORDER — METHYLPREDNISOLONE SODIUM SUCC 125 MG IJ SOLR
80.0000 mg | Freq: Four times a day (QID) | INTRAMUSCULAR | Status: DC
  Administered 2014-10-25: 80 mg via INTRAVENOUS
  Filled 2014-10-25: qty 2

## 2014-10-25 NOTE — Progress Notes (Signed)
Pt transported from ED to ICU without incident, on bipap via vent on 100% fio2.  Fio2 weaned back down to 30%.

## 2014-10-25 NOTE — Consult Note (Signed)
PULMONARY / CRITICAL CARE MEDICINE   Name: Dawn Montoya MRN: [id number] DOB: Jan 21, 1932    ADMISSION DATE:  10/24/2014 CONSULTATION DATE:  1/2  REFERRING MD :  Rachel Moulds  CHIEF COMPLAINT:  Shortness of breath  INITIAL PRESENTATION: 81 yo female with COPD and CHF admitted on 1/1 with acute hypoxemic respiratory failure due to a COPD exacerbation and CHF exacerbation.  She required BIPAP overnight on the first day of admission.  STUDIES:    SIGNIFICANT EVENTS:    HISTORY OF PRESENT ILLNESS:  Dawn Montoya is a former smoker with COPD on 2L O2 who was admitted to [facility] on 1/1 with shortness of breath and cough. She noted over one week of chest and sinus congestion, difficulty breathing, wheezing, and cough productive of "dirty" yellow non-bloody mucus. She denied pain, but she noted a fever and chills at the onset of the illness around 12/22.  Her symptoms progressed and so she came to the ED on 1/1 for further management.  She was found to be in marked respiratory distress and was placed on BIPAP.  She notes compliance with her inhalers and states that there has not been a marked change in her chronic leg swelling in the last several weeks.  She received IV lasix and feels much better in the morning.    PAST MEDICAL HISTORY :   has a past medical history of COPD (chronic obstructive pulmonary disease); Breast CA; Hypertension; HTN (hypertension) (01/18/2014); Hypothyroidism (01/18/2014); Medical history non-contributory; Shortness of breath; Pneumonia; and Diastolic CHF (12/30/7562).  has past surgical history that includes Cholecystectomy; Breast surgery; Mastectomy (Left); Esophagogastroduodenoscopy (N/A, 01/19/2014); and Colonoscopy (N/A, 01/20/2014). Prior to Admission medications   Medication Sig Start Date End Date Taking? Authorizing Provider  albuterol (PROVENTIL) (2.5 MG/3ML) 0.083% nebulizer solution Take 2.5 mg by nebulization every 6 (six) hours as needed for wheezing or shortness of  breath.   Yes Historical Provider, MD  budesonide (PULMICORT) 0.25 MG/2ML nebulizer solution Take 0.25 mg by nebulization 2 (two) times daily.   Yes Historical Provider, MD  busPIRone (BUSPAR) 7.5 MG tablet Take 7.5 mg by mouth 3 (three) times daily.   Yes Historical Provider, MD  cholecalciferol (VITAMIN D) 1000 UNITS tablet Take 1,000 Units by mouth daily.   Yes Historical Provider, MD  Fluticasone-Salmeterol (ADVAIR) 250-50 MCG/DOSE AEPB Inhale 1 puff into the lungs 2 (two) times daily.   Yes Historical Provider, MD  hydrochlorothiazide (HYDRODIURIL) 25 MG tablet Take 25 mg by mouth daily.   Yes Historical Provider, MD  levothyroxine (SYNTHROID, LEVOTHROID) 75 MCG tablet Take 75 mcg by mouth daily before breakfast.   Yes Historical Provider, MD  lisinopril (PRINIVIL,ZESTRIL) 20 MG tablet Take 20 mg by mouth daily.   Yes Historical Provider, MD  Multiple Vitamins-Minerals (MULTIVITAMIN WITH MINERALS) tablet Take 1 tablet by mouth daily.   Yes Historical Provider, MD  tiotropium (SPIRIVA HANDIHALER) 18 MCG inhalation capsule Place 1 capsule (18 mcg total) into inhaler and inhale daily. 02/25/14  Yes Charlynne Cousins, MD  albuterol (PROVENTIL HFA;VENTOLIN HFA) 108 (90 BASE) MCG/ACT inhaler Inhale 2 puffs into the lungs every 6 (six) hours as needed for wheezing or shortness of breath.     Historical Provider, MD   No Known Allergies  FAMILY HISTORY:  has no family status information on file.  SOCIAL HISTORY:  reports that she quit smoking about 15 years ago. Her smoking use included Cigarettes. She has a 100 pack-year smoking history. She does not have any smokeless tobacco history on file.  She reports that she does not drink alcohol or use illicit drugs.  REVIEW OF SYSTEMS:   Gen: + fever, chills, denies weight change, + fatigue, denies night sweats HEENT: Denies blurred vision, double vision, hearing loss, tinnitus, sinus congestion, rhinorrhea, sore throat, neck stiffness, dysphagia PULM:  per HPI CV: Denies chest pain, notes chronic leg edema, denies orthopnea, paroxysmal nocturnal dyspnea, palpitations GI: Denies abdominal pain, nausea, vomiting, diarrhea, hematochezia, melena, constipation, change in bowel habits GU: Denies dysuria, hematuria, polyuria, oliguria, urethral discharge Endocrine: Denies hot or cold intolerance, polyuria, polyphagia or appetite change Derm: Denies rash, dry skin, scaling or peeling skin change Heme: Denies easy bruising, bleeding, bleeding gums Neuro: Denies headache, numbness, weakness, slurred speech, loss of memory or consciousness   SUBJECTIVE:   VITAL SIGNS: Temp:  [97.7 F (36.5 C)-98.8 F (37.1 C)] 98.8 F (37.1 C) (01/02 0700) Pulse Rate:  [66-98] 66 (01/02 0700) Resp:  [13-46] 16 (01/02 0700) BP: (109-195)/(31-116) 109/38 mmHg (01/02 0700) SpO2:  [92 %-100 %] 97 % (01/02 0700) FiO2 (%):  [21 %-100 %] 30 % (01/02 0416) Weight:  [116.3 kg (256 lb 6.3 oz)] 116.3 kg (256 lb 6.3 oz) (01/02 0100) HEMODYNAMICS:   VENTILATOR SETTINGS: Vent Mode:  [-] Other (Comment) FiO2 (%):  [21 %-100 %] 30 % Set Rate:  [14 bmp] 14 bmp PEEP:  [5 cmH20] 5 cmH20 INTAKE / OUTPUT:  Intake/Output Summary (Last 24 hours) at 10/25/14 0838 Last data filed at 10/25/14 0750  Gross per 24 hour  Intake 400.35 ml  Output   2675 ml  Net -2274.65 ml    PHYSICAL EXAMINATION: General:  No acute distress Neuro:  A&Ox4, MAEW HEENT:  NCAT, EOMi Cardiovascular:  RRR, no mgr Lungs:  Wheezing and rhonchi all over, limited air movement, no crackles Abdomen:  BS+, soft, nontender Musculoskeletal:  Warm, normal bulk and tone Skin:  No rash or skin breakdown Ext: chronic appearing edema in legs bilaterally  LABS:  CBC  Recent Labs Lab 10/24/14 2107 10/25/14 0235  WBC 14.3* 10.2  HGB 13.3 12.0  HCT 41.6 37.3  PLT 270 215   Coag's No results for input(s): APTT, INR in the last 168 hours. BMET  Recent Labs Lab 10/24/14 2107 10/25/14 0235  NA  133* 134*  K 4.4 4.0  CL 96 93*  CO2 27 29  BUN 26* 27*  CREATININE 0.84 0.87  GLUCOSE 136* 204*   Electrolytes  Recent Labs Lab 10/24/14 2107 10/25/14 0235  CALCIUM 8.7 8.3*   Sepsis Markers No results for input(s): LATICACIDVEN, PROCALCITON, O2SATVEN in the last 168 hours. ABG  Recent Labs Lab 10/24/14 2205 10/25/14 0420  PHART 7.260* 7.401  PCO2ART 65.7* 49.6*  PO2ART 492.0* 76.1*   Liver Enzymes  Recent Labs Lab 10/24/14 2107 10/25/14 0235  AST 42* 35  ALT 40* 36*  ALKPHOS 91 82  BILITOT 1.0 0.6  ALBUMIN 3.8 3.5   Cardiac Enzymes  Recent Labs Lab 10/24/14 2103 10/25/14 0235 10/25/14 0739  TROPONINI 0.10* 0.10* 0.09*   Glucose No results for input(s): GLUCAP in the last 168 hours.  Imaging Dg Chest Portable 1 View  10/24/2014   CLINICAL DATA:  Respiratory distress. Patient is unable to communicate.  EXAM: PORTABLE CHEST - 1 VIEW  COMPARISON:  02/22/2014  FINDINGS: Normal heart size. Mild prominence of pulmonary vascularity may indicate mild vascular congestion. No blunting of costophrenic angles. No airspace disease. No pneumothorax. Azygos lobe. Calcified and tortuous aorta.  IMPRESSION: Mild prominence of pulmonary vascularity suggesting  mild congestion. Heart size is normal. No focal airspace disease.   Electronically Signed   By: Lucienne Capers M.D.   On: 10/24/2014 21:19     ASSESSMENT / PLAN:  PULMONARY OETT n/a A:  Acute on chronic respiratory failure with hypoxemia due to COPD and CHF exacerbation> Currently improved after BIPAP and lasix.  While this appears to be both CHF and COPD, her history favors a COPD exacerbation given the recent viral infection and cough with mucus production. COPD on 2L O2 P:   BIPAP PRN Agree with solumedrol today, would change to prednisone on 10/26/14 and taper from 40mg  daily to off over 10 days Change pulmicort to 0.5mg  q12h, add brovana Change scheduled short acting bronchodilators to albuterol q6h and  prn Add Tiotropium Will arrange pulmonary follow up  CARDIOVASCULAR CVL N/A A: Acute decompensated diastolic heart failure> now improved after 2 doses of lasix P:  Maintain current afterload regimen including ACE-Inhibitor Would resume home lasix dosing tomorrow   FAMILY  - Updates: Attempted to call granddaughter [name], had to leave message   Roselie Awkward, MD [facility] Pager: [phone] Cell: [phone] If no response, call [phone]  10/25/2014, 8:38 AM

## 2014-10-25 NOTE — Progress Notes (Signed)
BiPAP not required at this time- Sp02 93%, HR 72, RR 15.

## 2014-10-25 NOTE — Progress Notes (Signed)
PT is not in respiratory distress at this time- no need for BiPAP at this time. Sp02 96% on 4 lpm [place] (reduced to 3 lpm [place]), HR 69, RR17, BP 151/129 (RN aware).

## 2014-10-25 NOTE — Progress Notes (Signed)
ANTIBIOTIC CONSULT NOTE - INITIAL  Pharmacy Consult for Levaquin Indication: COPD Exacerbation  No Known Allergies  Patient Measurements: Height: 5\' 3"  (160 cm) Weight: 256 lb 6.3 oz (116.3 kg) IBW/kg (Calculated) : 52.4  Vital Signs: Temp: 98.2 F (36.8 C) (01/02 0145) Temp Source: Core (Comment) (01/02 0054) BP: 129/38 mmHg (01/02 0100) Pulse Rate: 83 (01/02 0145) Intake/Output from previous day: 01/01 0701 - 01/02 0700 In: 120 [P.O.:120] Out: 1090 [Urine:1090] Intake/Output from this shift: Total I/O In: 120 [P.O.:120] Out: 1090 [Urine:1090]  Labs:  Recent Labs  10/24/14 2107  WBC 14.3*  HGB 13.3  PLT 270  CREATININE 0.84   Estimated Creatinine Clearance: 65.8 mL/min (by C-G formula based on Cr of 0.84). No results for input(s): VANCOTROUGH, VANCOPEAK, VANCORANDOM, GENTTROUGH, GENTPEAK, GENTRANDOM, TOBRATROUGH, TOBRAPEAK, TOBRARND, AMIKACINPEAK, AMIKACINTROU, AMIKACIN in the last 72 hours.   Microbiology: No results found for this or any previous visit (from the past 720 hour(s)).  Medical History: Past Medical History  Diagnosis Date  . COPD (chronic obstructive pulmonary disease)   . Breast CA   . Hypertension   . HTN (hypertension) 01/18/2014  . Hypothyroidism 01/18/2014  . Medical history non-contributory   . Shortness of breath   . Pneumonia   . Diastolic CHF 01/24/8767    Medications:  Scheduled:  . albuterol  2.5 mg Nebulization Q4H  . aspirin EC  325 mg Oral Daily  . budesonide  0.25 mg Nebulization BID  . busPIRone  5 mg Oral TID  . enoxaparin (LOVENOX) injection  40 mg Subcutaneous Q24H  . furosemide  40 mg Intravenous Daily  . hydrochlorothiazide  25 mg Oral Daily  . ipratropium  0.5 mg Nebulization Q6H  . levofloxacin (LEVAQUIN) IV  750 mg Intravenous Q24H  . levothyroxine  75 mcg Oral QAC breakfast  . lisinopril  10 mg Oral Daily  . potassium chloride  20 mEq Oral Daily  . sodium chloride  3 mL Intravenous Q12H   Infusions:    Assessment:  81 yr female with COPD being admitted for HTN emergency with CHF exacerbation  Pharmacy consulted to dose Levaquin for COPD exacerbation  CrCl ~ 65 ml/min  Goal of Therapy:  Eradication of infection  Plan:  Levaquin 750mg  IV q24h  Promyse Ardito, Toribio Harbour, PharmD 10/25/2014,2:17 AM

## 2014-10-25 NOTE — Progress Notes (Signed)
PT does not appear to require BiPAP at this time.

## 2014-10-25 NOTE — Progress Notes (Signed)
eLink Physician-Brief Progress Note Patient Name: Dawn Montoya DOB: [birth year] MRN: [id number]   Date of Service  10/25/2014  HPI/Events of Note  New admit: HTN emrgn with chf exacerbation. H/o copd abg reviewed, hypercapnia. In room appears tolerating NIMV well, awake, changing channels on TV, rr 12  eICU Interventions  Increase PC over peep to 10/ 5, repeat abg in am  continued lasix per Dr Raliegh Ip, no role steroids IV unless bronchospasm or not moving air well ( described to not be the case), trop ecg neg thus far     Intervention Category Evaluation Type: New Patient Evaluation  Raylene Miyamoto. 10/25/2014, 1:29 AM

## 2014-10-25 NOTE — Progress Notes (Signed)
[facility] PROGRESS NOTE  Dawn Montoya NWG:[id number] DOB: 10-Jun-1932 DOA: 10/24/2014  PCP: Bartholome Bill, MD  Brief HPI: 81 year old Caucasian female with a history of COPD on home oxygen, diastolic CHF, hypothyroidism, presented to the emergency department with worsening shortness of breath. She was found to be in hypercapnic respiratory failure. Chest x-ray raised the possibility of vascular congestion. She was admitted and placed on BiPAP.  Past medical history:  Past Medical History  Diagnosis Date  . COPD (chronic obstructive pulmonary disease)   . Breast CA   . Hypertension   . HTN (hypertension) 01/18/2014  . Hypothyroidism 01/18/2014  . Medical history non-contributory   . Shortness of breath   . Pneumonia   . Diastolic CHF 02/28/8468    Consultants: Pulmonology  Procedures: None  Antibiotics: Levaquin  Subjective: Patient feels better this morning. Continues to have some wheezing. Denies any chest pain. No nausea, vomiting.  Objective: Vital Signs  Filed Vitals:   10/25/14 0600 10/25/14 0630 10/25/14 0700 10/25/14 1016  BP: 113/31  109/38   Pulse: 73 67 66   Temp: 98.6 F (37 C) 98.6 F (37 C) 98.8 F (37.1 C)   TempSrc:      Resp:   16   Height:      Weight:      SpO2: 97% 97% 97% 96%    Intake/Output Summary (Last 24 hours) at 10/25/14 1107 Last data filed at 10/25/14 0750  Gross per 24 hour  Intake 400.35 ml  Output   2675 ml  Net -2274.65 ml   Filed Weights   10/25/14 0100  Weight: 116.3 kg (256 lb 6.3 oz)    General appearance: alert, cooperative, appears stated age, no distress and moderately obese Head: Normocephalic, without obvious abnormality, atraumatic Resp: diffuse end expiratory wheezing bilaterally. Few crackles at the bases. No rhonchi. Cardio: regular rate and rhythm, S1, S2 normal, no murmur, click, rub or gallop GI: soft, non-tender; bowel sounds normal; no masses,  no organomegaly Extremities: extremities  normal, atraumatic, no cyanosis or edema Neurologic: Awake and alert. No focal neurological deficits.  Lab Results:  Basic Metabolic Panel:  Recent Labs Lab 10/24/14 2107 10/25/14 0235  NA 133* 134*  K 4.4 4.0  CL 96 93*  CO2 27 29  GLUCOSE 136* 204*  BUN 26* 27*  CREATININE 0.84 0.87  CALCIUM 8.7 8.3*   Liver Function Tests:  Recent Labs Lab 10/24/14 2107 10/25/14 0235  AST 42* 35  ALT 40* 36*  ALKPHOS 91 82  BILITOT 1.0 0.6  PROT 7.5 7.0  ALBUMIN 3.8 3.5   CBC:  Recent Labs Lab 10/24/14 2107 10/25/14 0235  WBC 14.3* 10.2  NEUTROABS 10.4* 9.7*  HGB 13.3 12.0  HCT 41.6 37.3  MCV 89.7 89.2  PLT 270 215   Cardiac Enzymes:  Recent Labs Lab 10/24/14 2103 10/25/14 0235 10/25/14 0739  TROPONINI 0.10* 0.10* 0.09*   BNP (last 3 results)  Recent Labs  01/18/14 1532 01/19/14 0325 02/22/14 1130  PROBNP 484.2* 423.1 383.7   CBG:  Recent Labs Lab 10/25/14 0815  GLUCAP 130*    Recent Results (from the past 240 hour(s))  MRSA PCR Screening     Status: Abnormal   Collection Time: 10/25/14 12:39 AM  Result Value Ref Range Status   MRSA by PCR POSITIVE (A) NEGATIVE Final    Comment:        The GeneXpert MRSA Assay (FDA approved for NASAL specimens only), is one component of a comprehensive  MRSA colonization surveillance program. It is not intended to diagnose MRSA infection nor to guide or monitor treatment for MRSA infections. RESULT CALLED TO, READ BACK BY AND VERIFIED WITH: SPOKE WITH FREI,L RN [phone] [place],N       Studies/Results: Dg Chest Portable 1 View  10/24/2014   CLINICAL DATA:  Respiratory distress. Patient is unable to communicate.  EXAM: PORTABLE CHEST - 1 VIEW  COMPARISON:  02/22/2014  FINDINGS: Normal heart size. Mild prominence of pulmonary vascularity may indicate mild vascular congestion. No blunting of costophrenic angles. No airspace disease. No pneumothorax. Azygos lobe. Calcified and tortuous aorta.   IMPRESSION: Mild prominence of pulmonary vascularity suggesting mild congestion. Heart size is normal. No focal airspace disease.   Electronically Signed   By: Lucienne Capers M.D.   On: 10/24/2014 21:19    Medications:  Scheduled: . albuterol  2.5 mg Nebulization Q6H  . antiseptic oral rinse  7 mL Mouth Rinse BID  . arformoterol  15 mcg Nebulization BID  . aspirin EC  325 mg Oral Daily  . budesonide  0.5 mg Nebulization BID  . busPIRone  5 mg Oral TID  . enoxaparin (LOVENOX) injection  40 mg Subcutaneous Q24H  . furosemide  40 mg Intravenous Daily  . insulin aspart  0-15 Units Subcutaneous TID WC  . insulin aspart  0-5 Units Subcutaneous QHS  . levofloxacin (LEVAQUIN) IV  750 mg Intravenous Q24H  . levothyroxine  75 mcg Oral QAC breakfast  . lisinopril  10 mg Oral Daily  . methylPREDNISolone (SOLU-MEDROL) injection  80 mg Intravenous Q6H  . potassium chloride  20 mEq Oral Daily  . [START ON 10/26/2014] predniSONE  40 mg Oral Q breakfast  . sodium chloride  3 mL Intravenous Q12H  . tiotropium  18 mcg Inhalation Daily   Continuous:  VQM:[id number] **OR** acetaminophen, albuterol, ondansetron **OR** ondansetron (ZOFRAN) IV, oxyCODONE  Assessment/Plan:  Principal Problem:   Acute respiratory failure Active Problems:   Hypothyroidism   COPD exacerbation   Acute on chronic diastolic heart failure   Uncontrolled hypertension   Acute respiratory failure with hypoxia   Acute respiratory failure with hypoxia This was most likely due to a combination of diastolic heart failure and COPD. However, appears to be mostly COPD exacerbation. She was given intravenous Lasix. She was placed on BiPAP. She is much better but continues to have wheezing. I think there is a benefit to adding steroids, so we will do that. Continue with other medications as prescribed. Discussed with Dr. [place] Bells with pulmonology.  Acute COPD exacerbation. Added steroids. Continue antibiotics and nebulizer  treatments. ABG shows improvement. Continue with inhaled steroids as well. Appreciate pulmonology assistance.   Hyperglycemia No known history of diabetes. Possibly from acute stress and steroid. Initiate sliding scale coverage. Check HbA1c in the morning.  Possible acute diastolic CHF. Last EF was 60-65% in March 2015. She was given intravenous Lasix. We will continue for today. Transition her to oral diuretics in the morning. No need to repeat echocardiogram.  Accelerated hypertension. She was placed on nitroglycerin infusion. Blood pressure is much better. She has been taken off of the nitroglycerin infusion. Continue to monitor. She is on ACE inhibitor, which will be continued.   History of hypothyroidism Continue with home medications.  DVT Prophylaxis: Lovenox    Code Status:. Full code  Family Communication: discussed with the patient  Disposition Plan: not ready for discharge. She will remain in step down unit for most of the day today. Mobilize soon.  LOS: 1 day   [facility] Pager [phone] 10/25/2014, 11:07 AM  If 7PM-7AM, please contact night-coverage at [email or web address], password [facility]

## 2014-10-25 NOTE — H&P (Signed)
[facility] History and Physical  Jo Booze JIR:[id number] DOB: [birth year] DOA: 10/24/2014  Referring physician: ER physician. PCP: No primary care provider on file.   Chief Complaint: Shortness of breath.  HPI: Dawn Montoya is a 81 y.o. female with a history of COPD on home oxygen, diastolic CHF last EF measured was 65-70% in March of last year, hypothyroidism and hypertension presents to the ER because of worsening shortness of breath. Patient states she is chronically short of breath as well as over the last 3-4 days she has got acutely short of breath with productive cough. Denies any fever or chills. In the ER patient was found to be in acute respiratory distress and was placed on BiPAP. Patient also was found to be wheezing. Chest x-ray shows congestion and patient blood pressure was found to be elevated with systolic blood pressure in the 751 and diastolic in the 025. Patient was started on nitroglycerin infusion and Lasix 40 mg IV was given. Patient has been also placed on nebulizers. Patient at this time is admitted for acute respiratory failure with hypoxia. ABG also shows elevated carbon dioxide with pH and acidotic range. Patient states she has had some pleuritic-type of chest pain. But at this time patient is chest pain-free. Denies any nausea vomiting abdominal pain or diarrhea.  Review of Systems: As presented in the history of presenting illness, rest negative.  Past Medical History  Diagnosis Date  . COPD (chronic obstructive pulmonary disease)   . Breast CA   . Hypertension   . HTN (hypertension) 01/18/2014  . Hypothyroidism 01/18/2014  . Medical history non-contributory   . Shortness of breath   . Pneumonia   . Diastolic CHF 05/28/2777   Past Surgical History  Procedure Laterality Date  . Cholecystectomy    . Breast surgery    . Mastectomy Left   . Esophagogastroduodenoscopy N/A 01/19/2014    Procedure: ESOPHAGOGASTRODUODENOSCOPY (EGD);  Surgeon: Jeryl Columbia, MD;  Location: [facility];  Service: Endoscopy;  Laterality: N/A;  . Colonoscopy N/A 01/20/2014    Procedure: COLONOSCOPY;  Surgeon: Winfield Cunas., MD;  Location: [facility];  Service: Endoscopy;  Laterality: N/A;   Social History:  reports that she quit smoking about 15 years ago. Her smoking use included Cigars, Pipe, and Cigarettes. She smoked 0.00 packs per day. She does not have any smokeless tobacco history on file. She reports that she does not drink alcohol or use illicit drugs. Where does patient live home. Can patient participate in ADLs? Yes.  No Known Allergies  Family History:  Family History  Problem Relation Age of Onset  . Lung cancer Sister   . Breast cancer Sister   . Breast cancer Daughter       Prior to Admission medications   Medication Sig Start Date End Date Taking? Authorizing Provider  albuterol (PROVENTIL HFA;VENTOLIN HFA) 108 (90 BASE) MCG/ACT inhaler Inhale 2 puffs into the lungs every 6 (six) hours as needed for wheezing or shortness of breath.     Historical Provider, MD  albuterol (PROVENTIL) (2.5 MG/3ML) 0.083% nebulizer solution Take 2.5 mg by nebulization every 6 (six) hours as needed for wheezing or shortness of breath.    Historical Provider, MD  budesonide (PULMICORT) 0.25 MG/2ML nebulizer solution Take 0.25 mg by nebulization 2 (two) times daily.    Historical Provider, MD  busPIRone (BUSPAR) 5 MG tablet Take 1 tablet (5 mg total) by mouth 3 (three) times daily. 02/25/14   Charlynne Cousins, MD  cholecalciferol (VITAMIN D) 1000 UNITS tablet Take 1,000 Units by mouth daily.    Historical Provider, MD  Fluticasone-Salmeterol (ADVAIR) 250-50 MCG/DOSE AEPB Inhale 1 puff into the lungs 2 (two) times daily.    Historical Provider, MD  hydrochlorothiazide (HYDRODIURIL) 25 MG tablet Take 25 mg by mouth daily.    Historical Provider, MD  levofloxacin (LEVAQUIN) 500 MG tablet Take 1 tablet (500 mg total) by mouth daily. 02/25/14   Charlynne Cousins, MD  levothyroxine (SYNTHROID, LEVOTHROID) 75 MCG tablet Take 75 mcg by mouth daily before breakfast.    Historical Provider, MD  lisinopril (PRINIVIL,ZESTRIL) 10 MG tablet Take 10 mg by mouth daily.    Historical Provider, MD  Multiple Vitamins-Minerals (MULTIVITAMIN WITH MINERALS) tablet Take 1 tablet by mouth daily.    Historical Provider, MD  predniSONE (DELTASONE) 10 MG tablet Takes 6 tablets for 2 days, then 5 tablets for 2 days, then 4 tablets for 2 days, then 3 tablets for 2 days, then 2 tabs for 2 days, then 1 tab for 2 days, and then stop. 02/25/14   Charlynne Cousins, MD  tiotropium (SPIRIVA HANDIHALER) 18 MCG inhalation capsule Place 1 capsule (18 mcg total) into inhaler and inhale daily. 02/25/14   Charlynne Cousins, MD  tiotropium (SPIRIVA) 18 MCG inhalation capsule Place 18 mcg into inhaler and inhale daily.    Historical Provider, MD    Physical Exam: Filed Vitals:   10/25/14 0050 10/25/14 0054 10/25/14 0058 10/25/14 0100  BP: 173/71 140/44  129/38  Pulse: 89 82 89 88  Temp: 98.2 F (36.8 C) 98.4 F (36.9 C) 98.4 F (36.9 C) 98.4 F (36.9 C)  TempSrc: Core (Comment) Core (Comment)    Resp: 24 19 19 23   SpO2: 93% 97% 96% 96%     General:  Obese with mildly short of breath.  Eyes: Anicteric no pallor.  ENT: No discharge from the ears eyes nose or mouth.  Neck: No mass felt. JVD not appreciated.  Cardiovascular: S1-S2 heard.  Respiratory: Mild wheezing no crepitations. Distended breath sounds.  Abdomen: Soft nontender bowel sounds present.  Skin: Chronic skin changes in the lower extremity.  Musculoskeletal: Bilateral lower extremity edema.  Psychiatric: Appears normal.  Neurologic: Alert awake oriented to time place and person. Moves all extremities.  Labs on Admission:  Basic Metabolic Panel:  Recent Labs Lab 10/24/14 2107  NA 133*  K 4.4  CL 96  CO2 27  GLUCOSE 136*  BUN 26*  CREATININE 0.84  CALCIUM 8.7   Liver Function  Tests:  Recent Labs Lab 10/24/14 2107  AST 42*  ALT 40*  ALKPHOS 91  BILITOT 1.0  PROT 7.5  ALBUMIN 3.8   No results for input(s): LIPASE, AMYLASE in the last 168 hours. No results for input(s): AMMONIA in the last 168 hours. CBC:  Recent Labs Lab 10/24/14 2107  WBC 14.3*  NEUTROABS 10.4*  HGB 13.3  HCT 41.6  MCV 89.7  PLT 270   Cardiac Enzymes:  Recent Labs Lab 10/24/14 2103  TROPONINI 0.10*    BNP (last 3 results)  Recent Labs  01/18/14 1532 01/19/14 0325 02/22/14 1130  PROBNP 484.2* 423.1 383.7   CBG: No results for input(s): GLUCAP in the last 168 hours.  Radiological Exams on Admission: Dg Chest Portable 1 View  10/24/2014   CLINICAL DATA:  Respiratory distress. Patient is unable to communicate.  EXAM: PORTABLE CHEST - 1 VIEW  COMPARISON:  02/22/2014  FINDINGS: Normal heart size. Mild prominence of  pulmonary vascularity may indicate mild vascular congestion. No blunting of costophrenic angles. No airspace disease. No pneumothorax. Azygos lobe. Calcified and tortuous aorta.  IMPRESSION: Mild prominence of pulmonary vascularity suggesting mild congestion. Heart size is normal. No focal airspace disease.   Electronically Signed   By: Lucienne Capers M.D.   On: 10/24/2014 21:19    EKG: Independently reviewed. Normal sinus rhythm.  Assessment/Plan Principal Problem:   Acute respiratory failure Active Problems:   Hypothyroidism   COPD exacerbation   Acute on chronic diastolic heart failure   Uncontrolled hypertension   Acute respiratory failure with hypoxia   1. Acute respiratory failure with hypoxia - suspect most likely a combination of diastolic heart failure and COPD. Last EF measured was 60-65% in March of last year. At this time patient has received 40 Lasix and have ordered one more dose of oral Lasix IV and also discussed with on-call pulmonary critical care Dr. Titus Mould will be seeing patient in consult. Patient also had uncontrolled  hypertension which probably is contributing to patient's symptoms. Patient was on nitroglycerin infusion which can be slowly tapered once patient's blood pressure improves. Patient also will be continued on BiPAP and repeat ABG later. Continue with nebulizer Pulmicort and antibiotics for COPD. If patient continues to wheeze and will begin IV steroids. 2. CHF diastolic last year measured was 60-65% - see #1. 3. Uncontrolled hypertension - see #1. 4. COPD - see #1. 5. Hypothyroidism - continue Synthroid. Check TSH.   DVT Prophylaxis Lovenox.  Code Status: Full code.  Family Communication: None.  Disposition Plan: Admit to inpatient.    Elinda Bunten N. [facility] Pager [phone].  If 7PM-7AM, please contact night-coverage [email or web address] Password [email or web address] 10/25/2014, 1:38 AM

## 2014-10-26 DIAGNOSIS — E039 Hypothyroidism, unspecified: Secondary | ICD-10-CM

## 2014-10-26 LAB — CBC
HCT: 38.9 % (ref 36.0–46.0)
HEMOGLOBIN: 11.9 g/dL — AB (ref 12.0–15.0)
MCH: 27.7 pg (ref 26.0–34.0)
MCHC: 30.6 g/dL (ref 30.0–36.0)
MCV: 90.5 fL (ref 78.0–100.0)
PLATELETS: 277 10*3/uL (ref 150–400)
RBC: 4.3 MIL/uL (ref 3.87–5.11)
RDW: 14.8 % (ref 11.5–15.5)
WBC: 10.3 10*3/uL (ref 4.0–10.5)

## 2014-10-26 LAB — TROPONIN I: TROPONIN I: 0.04 ng/mL — AB (ref <0.031)

## 2014-10-26 LAB — BASIC METABOLIC PANEL
Anion gap: 9 (ref 5–15)
Anion gap: 9 (ref 5–15)
BUN: 31 mg/dL — ABNORMAL HIGH (ref 6–23)
BUN: 32 mg/dL — ABNORMAL HIGH (ref 6–23)
CALCIUM: 8.6 mg/dL (ref 8.4–10.5)
CHLORIDE: 91 meq/L — AB (ref 96–112)
CHLORIDE: 94 meq/L — AB (ref 96–112)
CO2: 32 mmol/L (ref 19–32)
CO2: 31 mmol/L (ref 19–32)
CREATININE: 0.99 mg/dL (ref 0.50–1.10)
Calcium: 8.5 mg/dL (ref 8.4–10.5)
Creatinine, Ser: 0.98 mg/dL (ref 0.50–1.10)
GFR calc non Af Amer: 52 mL/min — ABNORMAL LOW (ref >90)
GFR calc non Af Amer: 53 mL/min — ABNORMAL LOW (ref >90)
GFR, EST AFRICAN AMERICAN: 61 mL/min — AB (ref >90)
GFR, EST AFRICAN AMERICAN: 61 mL/min — AB (ref >90)
GLUCOSE: 173 mg/dL — AB (ref 70–99)
GLUCOSE: 130 mg/dL — AB (ref 70–99)
Potassium: 3.9 mmol/L (ref 3.5–5.1)
Potassium: 4.3 mmol/L (ref 3.5–5.1)
SODIUM: 132 mmol/L — AB (ref 135–145)
Sodium: 134 mmol/L — ABNORMAL LOW (ref 135–145)

## 2014-10-26 LAB — GLUCOSE, CAPILLARY
GLUCOSE-CAPILLARY: 128 mg/dL — AB (ref 70–99)
Glucose-Capillary: 125 mg/dL — ABNORMAL HIGH (ref 70–99)
Glucose-Capillary: 118 mg/dL — ABNORMAL HIGH (ref 70–99)
Glucose-Capillary: 104 mg/dL — ABNORMAL HIGH (ref 70–99)

## 2014-10-26 LAB — HEMOGLOBIN A1C
HEMOGLOBIN A1C: 6.5 % — AB (ref <5.7)
Mean Plasma Glucose: 140 mg/dL — ABNORMAL HIGH (ref <117)

## 2014-10-26 MED ORDER — DEXTROMETHORPHAN POLISTIREX 30 MG/5ML PO LQCR
15.0000 mg | Freq: Three times a day (TID) | ORAL | Status: AC | PRN
  Administered 2014-10-26 – 2014-10-28 (×3): 15 mg via ORAL
  Filled 2014-10-26 (×4): qty 5

## 2014-10-26 MED ORDER — FUROSEMIDE 10 MG/ML IJ SOLN
40.0000 mg | Freq: Once | INTRAMUSCULAR | Status: AC
  Administered 2014-10-26: 40 mg via INTRAVENOUS
  Filled 2014-10-26: qty 4

## 2014-10-26 MED ORDER — CHLORHEXIDINE GLUCONATE CLOTH 2 % EX PADS
6.0000 | MEDICATED_PAD | Freq: Every day | CUTANEOUS | Status: AC
  Administered 2014-10-26 – 2014-10-30 (×5): 6 via TOPICAL

## 2014-10-26 MED ORDER — PREDNISONE 20 MG PO TABS
60.0000 mg | ORAL_TABLET | Freq: Two times a day (BID) | ORAL | Status: DC
  Administered 2014-10-26: 60 mg via ORAL
  Filled 2014-10-26: qty 3

## 2014-10-26 MED ORDER — BUSPIRONE HCL 10 MG PO TABS
10.0000 mg | ORAL_TABLET | Freq: Three times a day (TID) | ORAL | Status: DC
  Administered 2014-10-26 – 2014-10-31 (×15): 10 mg via ORAL
  Filled 2014-10-26 (×18): qty 1

## 2014-10-26 MED ORDER — TRAZODONE HCL 50 MG PO TABS
25.0000 mg | ORAL_TABLET | Freq: Every evening | ORAL | Status: DC | PRN
  Administered 2014-10-26 – 2014-10-30 (×5): 25 mg via ORAL
  Filled 2014-10-26 (×5): qty 1

## 2014-10-26 MED ORDER — BENZONATATE 100 MG PO CAPS
100.0000 mg | ORAL_CAPSULE | Freq: Three times a day (TID) | ORAL | Status: DC | PRN
  Administered 2014-10-26 – 2014-10-29 (×6): 100 mg via ORAL
  Filled 2014-10-26 (×6): qty 1

## 2014-10-26 MED ORDER — FUROSEMIDE 40 MG PO TABS
40.0000 mg | ORAL_TABLET | Freq: Every day | ORAL | Status: DC
  Administered 2014-10-26 – 2014-10-31 (×6): 40 mg via ORAL
  Filled 2014-10-26 (×6): qty 1

## 2014-10-26 MED ORDER — PHENOL 1.4 % MT LIQD
1.0000 | Freq: Three times a day (TID) | OROMUCOSAL | Status: DC | PRN
  Administered 2014-10-26: 1 via OROMUCOSAL
  Filled 2014-10-26: qty 177

## 2014-10-26 MED ORDER — PREDNISONE 20 MG PO TABS
40.0000 mg | ORAL_TABLET | Freq: Every day | ORAL | Status: DC
  Administered 2014-10-27 – 2014-10-29 (×3): 40 mg via ORAL
  Filled 2014-10-26 (×3): qty 2

## 2014-10-26 MED ORDER — MUPIROCIN 2 % EX OINT
1.0000 "application " | TOPICAL_OINTMENT | Freq: Two times a day (BID) | CUTANEOUS | Status: AC
  Administered 2014-10-26 – 2014-10-30 (×10): 1 via NASAL
  Filled 2014-10-26: qty 22

## 2014-10-26 MED ORDER — ZOLPIDEM TARTRATE 5 MG PO TABS
5.0000 mg | ORAL_TABLET | Freq: Once | ORAL | Status: AC
  Administered 2014-10-26: 5 mg via ORAL
  Filled 2014-10-26: qty 1

## 2014-10-26 NOTE — Progress Notes (Signed)
Patient brought up from ICU. Patient stood and pivoted to new bed and was very short of breath. Check oxygen saturation and they were 48%. Turned oxygen level to 6L and patient was very slow to recover. Highest oxygen reached was 78%.   Paged MD.   Placed on non-rebreather on 15L. Oxygen level immediately came up to 100%.   MD requested a STAT breathing treatment and to quickly take off non-rebreather and place back on 4L [place].   Will continue to monitor.

## 2014-10-26 NOTE — Progress Notes (Signed)
PT Cancellation Note  Patient Details Name: Dawn Montoya MRN: [id number] DOB: [birth year]   Cancelled Treatment:    Reason Eval/Treat Not Completed: Medical issues which prohibited therapy--RN requested PT be held onto today due to difficulty breathing earilier when transferring from [facility] to bed. will check back another day. thanks.    Weston Anna, MPT Pager: [phone]

## 2014-10-26 NOTE — Progress Notes (Signed)
Adjusted patient off of Non-rebreather and back on to 4L [place]. Oxygen saturation in mid to high 90's. MD aware.

## 2014-10-26 NOTE — Progress Notes (Signed)
PULMONARY / CRITICAL CARE MEDICINE   Name: Dawn Montoya MRN: [id number] DOB: 07/07/1932    ADMISSION DATE:  10/24/2014 CONSULTATION DATE:  1/2  REFERRING MD :  Rachel Moulds  CHIEF COMPLAINT:  Shortness of breath  INITIAL PRESENTATION: 81 yo female with COPD and CHF admitted on 1/1 with acute hypoxemic respiratory failure due to a COPD exacerbation and CHF exacerbation.  She required BIPAP overnight on the first day of admission.  STUDIES:    SIGNIFICANT EVENTS:    HISTORY OF PRESENT ILLNESS:  Dawn Montoya is a former smoker with COPD on 2L O2 who was admitted to [facility] on 1/1 with shortness of breath and cough. She noted over one week of chest and sinus congestion, difficulty breathing, wheezing, and cough productive of "dirty" yellow non-bloody mucus. She denied pain, but she noted a fever and chills at the onset of the illness around 12/22.  Her symptoms progressed and so she came to the ED on 1/1 for further management.  She was found to be in marked respiratory distress and was placed on BIPAP.  She notes compliance with her inhalers and states that there has not been a marked change in her chronic leg swelling in the last several weeks.  She received IV lasix and feels much better in the morning.    PAST MEDICAL HISTORY :   has a past medical history of COPD (chronic obstructive pulmonary disease); Breast CA; Hypertension; HTN (hypertension) (01/18/2014); Hypothyroidism (01/18/2014); Medical history non-contributory; Shortness of breath; Pneumonia; and Diastolic CHF (03/27/4033).  has past surgical history that includes Cholecystectomy; Breast surgery; Mastectomy (Left); Esophagogastroduodenoscopy (N/A, 01/19/2014); and Colonoscopy (N/A, 01/20/2014). Prior to Admission medications   Medication Sig Start Date End Date Taking? Authorizing Provider  albuterol (PROVENTIL) (2.5 MG/3ML) 0.083% nebulizer solution Take 2.5 mg by nebulization every 6 (six) hours as needed for wheezing or shortness of  breath.   Yes Historical Provider, MD  budesonide (PULMICORT) 0.25 MG/2ML nebulizer solution Take 0.25 mg by nebulization 2 (two) times daily.   Yes Historical Provider, MD  busPIRone (BUSPAR) 7.5 MG tablet Take 7.5 mg by mouth 3 (three) times daily.   Yes Historical Provider, MD  cholecalciferol (VITAMIN D) 1000 UNITS tablet Take 1,000 Units by mouth daily.   Yes Historical Provider, MD  Fluticasone-Salmeterol (ADVAIR) 250-50 MCG/DOSE AEPB Inhale 1 puff into the lungs 2 (two) times daily.   Yes Historical Provider, MD  hydrochlorothiazide (HYDRODIURIL) 25 MG tablet Take 25 mg by mouth daily.   Yes Historical Provider, MD  levothyroxine (SYNTHROID, LEVOTHROID) 75 MCG tablet Take 75 mcg by mouth daily before breakfast.   Yes Historical Provider, MD  lisinopril (PRINIVIL,ZESTRIL) 20 MG tablet Take 20 mg by mouth daily.   Yes Historical Provider, MD  Multiple Vitamins-Minerals (MULTIVITAMIN WITH MINERALS) tablet Take 1 tablet by mouth daily.   Yes Historical Provider, MD  tiotropium (SPIRIVA HANDIHALER) 18 MCG inhalation capsule Place 1 capsule (18 mcg total) into inhaler and inhale daily. 02/25/14  Yes Charlynne Cousins, MD  albuterol (PROVENTIL HFA;VENTOLIN HFA) 108 (90 BASE) MCG/ACT inhaler Inhale 2 puffs into the lungs every 6 (six) hours as needed for wheezing or shortness of breath.     Historical Provider, MD   No Known Allergies  FAMILY HISTORY:  has no family status information on file.  SOCIAL HISTORY:  reports that she quit smoking about 15 years ago. Her smoking use included Cigarettes. She has a 100 pack-year smoking history. She does not have any smokeless tobacco history on file.  She reports that she does not drink alcohol or use illicit drugs.  REVIEW OF SYSTEMS:   Gen: + fever, chills, denies weight change, + fatigue, denies night sweats HEENT: Denies blurred vision, double vision, hearing loss, tinnitus, sinus congestion, rhinorrhea, sore throat, neck stiffness, dysphagia PULM:  per HPI CV: Denies chest pain, notes chronic leg edema, denies orthopnea, paroxysmal nocturnal dyspnea, palpitations GI: Denies abdominal pain, nausea, vomiting, diarrhea, hematochezia, melena, constipation, change in bowel habits GU: Denies dysuria, hematuria, polyuria, oliguria, urethral discharge Endocrine: Denies hot or cold intolerance, polyuria, polyphagia or appetite change Derm: Denies rash, dry skin, scaling or peeling skin change Heme: Denies easy bruising, bleeding, bleeding gums Neuro: Denies headache, numbness, weakness, slurred speech, loss of memory or consciousness   SUBJECTIVE:   VITAL SIGNS: Temp:  [98.4 F (36.9 C)-99.3 F (37.4 C)] 98.8 F (37.1 C) (01/03 1000) Pulse Rate:  [61-81] 81 (01/03 1000) Resp:  [13-28] 20 (01/03 1000) BP: (106-160)/(29-99) 142/58 mmHg (01/03 1001) SpO2:  [93 %-100 %] 96 % (01/03 1000) Weight:  [115.6 kg (254 lb 13.6 oz)] 115.6 kg (254 lb 13.6 oz) (01/03 0500) HEMODYNAMICS:   VENTILATOR SETTINGS:   INTAKE / OUTPUT:  Intake/Output Summary (Last 24 hours) at 10/26/14 1048 Last data filed at 10/26/14 1025  Gross per 24 hour  Intake    230 ml  Output   2515 ml  Net  -2285 ml    PHYSICAL EXAMINATION: General:  No acute distress Neuro:  A&Ox4, MAEW HEENT:  NCAT, EOMi Cardiovascular:  RRR, no mgr Lungs:  Wheezing and rhonchi all over, limited air movement, no crackles Abdomen:  BS+, soft, nontender Musculoskeletal:  Warm, normal bulk and tone Skin:  No rash or skin breakdown Ext: chronic appearing edema in legs bilaterally  LABS:  CBC  Recent Labs Lab 10/24/14 2107 10/25/14 0235 10/26/14 0418  WBC 14.3* 10.2 10.3  HGB 13.3 12.0 11.9*  HCT 41.6 37.3 38.9  PLT 270 215 277   Coag's No results for input(s): APTT, INR in the last 168 hours. BMET  Recent Labs Lab 10/25/14 0235 10/26/14 0418 10/26/14 0831  NA 134* 132* 134*  K 4.0 3.9 4.3  CL 93* 91* 94*  CO2 29 32 31  BUN 27* 31* 32*  CREATININE 0.87 0.99 0.98   GLUCOSE 204* 173* 130*   Electrolytes  Recent Labs Lab 10/25/14 0235 10/26/14 0418 10/26/14 0831  CALCIUM 8.3* 8.5 8.6   Sepsis Markers No results for input(s): LATICACIDVEN, PROCALCITON, O2SATVEN in the last 168 hours. ABG  Recent Labs Lab 10/24/14 2205 10/25/14 0420  PHART 7.260* 7.401  PCO2ART 65.7* 49.6*  PO2ART 492.0* 76.1*   Liver Enzymes  Recent Labs Lab 10/24/14 2107 10/25/14 0235  AST 42* 35  ALT 40* 36*  ALKPHOS 91 82  BILITOT 1.0 0.6  ALBUMIN 3.8 3.5   Cardiac Enzymes  Recent Labs Lab 10/25/14 0739 10/25/14 1414 10/26/14 0418  TROPONINI 0.09* 0.06* 0.04*   Glucose  Recent Labs Lab 10/25/14 0815 10/25/14 1206 10/25/14 1605 10/25/14 2133  GLUCAP 130* 153* 193* 175*    Imaging No results found.   ASSESSMENT / PLAN:  PULMONARY OETT n/a A:  Acute on chronic respiratory failure with hypoxemia due to COPD and CHF exacerbation> improved, not wheezing 1/3 COPD on 2L O2 Granddaughter notes non-compliance with therapy at home, specifically Spiriva P:   BIPAP PRN Change to prednisone today as not wheezing Continue pulmicort 0.5mg  q12h, brovana Scheduled short acting bronchodilators to albuterol q6h and prn Continue Tiotropium,  RT to educate on proper use Will arrange pulmonary follow up  CARDIOVASCULAR CVL N/A A: Acute decompensated diastolic heart failure> now improved after 2 doses of lasix P:  Maintain current afterload regimen including ACE-Inhibitor Would use home dose of lasix  PSYCHE A: Anxiety contributing to sensation of dyspnea P: Increase Buspar  FAMILY  - Updates: updated Granddaughter Maria by phone  Roselie Awkward, MD [facility] Pager: [phone] Cell: [phone] If no response, call [phone]  10/26/2014, 10:48 AM

## 2014-10-26 NOTE — Progress Notes (Signed)
Agree with plan of care from ICU RN. Will continue to monitor.

## 2014-10-26 NOTE — Progress Notes (Addendum)
[facility] PROGRESS NOTE  Dawn Montoya DJS:[id number] DOB: 04/21/1932 DOA: 10/24/2014  PCP: Bartholome Bill, MD  Brief HPI: 81 year old Caucasian female with a history of COPD on home oxygen, diastolic CHF, hypothyroidism, presented to the emergency department with worsening shortness of breath. She was found to be in hypercapnic respiratory failure. Chest x-ray raised the possibility of vascular congestion. She was admitted and placed on BiPAP.  Past medical history:  Past Medical History  Diagnosis Date  . COPD (chronic obstructive pulmonary disease)   . Breast CA   . Hypertension   . HTN (hypertension) 01/18/2014  . Hypothyroidism 01/18/2014  . Medical history non-contributory   . Shortness of breath   . Pneumonia   . Diastolic CHF 05/31/5026    Consultants: Pulmonology  Procedures: None  Antibiotics: Levaquin  Subjective: Patient continues to improve. Denies any chest pain. Still coughing with yellow sputum. Denies any nausea or vomiting. Did not require BiPAP overnight.  Objective: Vital Signs  Filed Vitals:   10/25/14 2200 10/25/14 2352 10/26/14 0000 10/26/14 0500  BP: 160/53 145/79 137/50   Pulse: 67  65   Temp: 99 F (37.2 C)  98.8 F (37.1 C)   TempSrc:  Oral    Resp: 21  28   Height:      Weight:   115.6 kg (254 lb 13.6 oz) 115.6 kg (254 lb 13.6 oz)  SpO2: 93%  93%     Intake/Output Summary (Last 24 hours) at 10/26/14 0735 Last data filed at 10/26/14 0400  Gross per 24 hour  Intake    580 ml  Output   2375 ml  Net  -1795 ml   Filed Weights   10/25/14 0100 10/26/14 0000 10/26/14 0500  Weight: 116.3 kg (256 lb 6.3 oz) 115.6 kg (254 lb 13.6 oz) 115.6 kg (254 lb 13.6 oz)    General appearance: alert, cooperative, appears stated age, no distress and moderately obese Resp: Improved air entry bilaterally. Very few scattered wheezes heard. Much improved compared to yesterday. Few crackles at the bases. No rhonchi. Cardio: regular rate and  rhythm, S1, S2 normal, no murmur, click, rub or gallop GI: soft, non-tender; bowel sounds normal; no masses,  no organomegaly Extremities: extremities normal, atraumatic, no cyanosis or edema Neurologic: Awake and alert. No focal neurological deficits.  Lab Results:  Basic Metabolic Panel:  Recent Labs Lab 10/24/14 2107 10/25/14 0235  NA 133* 134*  K 4.4 4.0  CL 96 93*  CO2 27 29  GLUCOSE 136* 204*  BUN 26* 27*  CREATININE 0.84 0.87  CALCIUM 8.7 8.3*   Liver Function Tests:  Recent Labs Lab 10/24/14 2107 10/25/14 0235  AST 42* 35  ALT 40* 36*  ALKPHOS 91 82  BILITOT 1.0 0.6  PROT 7.5 7.0  ALBUMIN 3.8 3.5   CBC:  Recent Labs Lab 10/24/14 2107 10/25/14 0235 10/26/14 0418  WBC 14.3* 10.2 10.3  NEUTROABS 10.4* 9.7*  --   HGB 13.3 12.0 11.9*  HCT 41.6 37.3 38.9  MCV 89.7 89.2 90.5  PLT 270 215 277   Cardiac Enzymes:  Recent Labs Lab 10/24/14 2103 10/25/14 0235 10/25/14 0739 10/25/14 1414  TROPONINI 0.10* 0.10* 0.09* 0.06*   BNP (last 3 results)  Recent Labs  01/18/14 1532 01/19/14 0325 02/22/14 1130  PROBNP 484.2* 423.1 383.7   CBG:  Recent Labs Lab 10/25/14 0815 10/25/14 1206 10/25/14 1605 10/25/14 2133  GLUCAP 130* 153* 193* 175*    Recent Results (from the past 240 hour(s))  MRSA PCR Screening     Status: Abnormal   Collection Time: 10/25/14 12:39 AM  Result Value Ref Range Status   MRSA by PCR POSITIVE (A) NEGATIVE Final    Comment:        The GeneXpert MRSA Assay (FDA approved for NASAL specimens only), is one component of a comprehensive MRSA colonization surveillance program. It is not intended to diagnose MRSA infection nor to guide or monitor treatment for MRSA infections. RESULT CALLED TO, READ BACK BY AND VERIFIED WITH: SPOKE WITH FREI,L RN [phone] C[place],N       Studies/Results: Dg Chest Portable 1 View  10/24/2014   CLINICAL DATA:  Respiratory distress. Patient is unable to communicate.  EXAM: PORTABLE  CHEST - 1 VIEW  COMPARISON:  02/22/2014  FINDINGS: Normal heart size. Mild prominence of pulmonary vascularity may indicate mild vascular congestion. No blunting of costophrenic angles. No airspace disease. No pneumothorax. Azygos lobe. Calcified and tortuous aorta.  IMPRESSION: Mild prominence of pulmonary vascularity suggesting mild congestion. Heart size is normal. No focal airspace disease.   Electronically Signed   By: Lucienne Capers M.D.   On: 10/24/2014 21:19    Medications:  Scheduled: . albuterol  2.5 mg Nebulization Q6H  . antiseptic oral rinse  7 mL Mouth Rinse BID  . arformoterol  15 mcg Nebulization BID  . aspirin EC  325 mg Oral Daily  . budesonide (PULMICORT) nebulizer solution  0.5 mg Nebulization BID  . busPIRone  5 mg Oral TID  . enoxaparin (LOVENOX) injection  40 mg Subcutaneous Q24H  . furosemide  40 mg Oral Daily  . insulin aspart  0-15 Units Subcutaneous TID WC  . insulin aspart  0-5 Units Subcutaneous QHS  . levofloxacin (LEVAQUIN) IV  750 mg Intravenous Q24H  . levothyroxine  75 mcg Oral QAC breakfast  . lisinopril  10 mg Oral Daily  . potassium chloride  20 mEq Oral Daily  . predniSONE  60 mg Oral BID WC  . sodium chloride  3 mL Intravenous Q12H  . tiotropium  18 mcg Inhalation Daily   Continuous:  RDE:YCXKGYJEHUDJS **OR** acetaminophen, albuterol, benzonatate, ondansetron **OR** ondansetron (ZOFRAN) IV, oxyCODONE, traZODone  Assessment/Plan:  Principal Problem:   Acute respiratory failure Active Problems:   Hypothyroidism   COPD exacerbation   Acute on chronic diastolic heart failure   Uncontrolled hypertension   Acute respiratory failure with hypoxia   Acute respiratory failure with hypoxia This was most likely due to a combination of diastolic heart failure and COPD. However, appears to be mostly COPD exacerbation. She was given intravenous Lasix. Significantly improved. Did not require BiPAP overnight. Continue oxygen by nasal cannula.  Acute  COPD exacerbation. Improved. Can change to oral steroids. Continue antibiotics and nebulizer treatments. Appreciate pulmonology input. Continue other inhalers as well.   Hyperglycemia No known history of diabetes. Possibly from acute stress and steroid. Continue sliding scale coverage. HbA1c is pending.  Possible acute diastolic CHF. Last EF was 60-65% in March 2015. She was given intravenous Lasix. Change to oral Lasix. No need to repeat echocardiogram. Admission EKG was poor quality. Due to concerns for T wave changes EKG was repeated today which is also not of great quality, but no T inversions noted on this EKG.  Accelerated hypertension. This was present in the emergency department. She was placed on nitroglycerin infusion, which was tapered off yesterday. Blood pressure is much better. She is on ACE inhibitor, which will be continued.   History of hypothyroidism Continue with  home medications.  DVT Prophylaxis: Lovenox    Code Status:. Full code  Family Communication: discussed with the patient  Disposition Plan: She is okay for transfer to the floor. Involve PT and OT. Mobilize.    LOS: 2 days   [facility] Pager [phone] 10/26/2014, 7:35 AM  If 7PM-7AM, please contact night-coverage at [email or web address], password [email or web address]

## 2014-10-27 LAB — CBC
HCT: 42.4 % (ref 36.0–46.0)
Hemoglobin: 13.1 g/dL (ref 12.0–15.0)
MCH: 28.4 pg (ref 26.0–34.0)
MCHC: 30.9 g/dL (ref 30.0–36.0)
MCV: 92 fL (ref 78.0–100.0)
Platelets: 317 10*3/uL (ref 150–400)
RBC: 4.61 MIL/uL (ref 3.87–5.11)
RDW: 15.3 % (ref 11.5–15.5)
WBC: 12 10*3/uL — ABNORMAL HIGH (ref 4.0–10.5)

## 2014-10-27 LAB — TROPONIN I: Troponin I: 0.04 ng/mL — ABNORMAL HIGH (ref <0.031)

## 2014-10-27 LAB — BASIC METABOLIC PANEL
Anion gap: 10 (ref 5–15)
BUN: 35 mg/dL — ABNORMAL HIGH (ref 6–23)
CO2: 33 mmol/L — ABNORMAL HIGH (ref 19–32)
Calcium: 8.6 mg/dL (ref 8.4–10.5)
Chloride: 91 mEq/L — ABNORMAL LOW (ref 96–112)
Creatinine, Ser: 1.06 mg/dL (ref 0.50–1.10)
GFR calc Af Amer: 56 mL/min — ABNORMAL LOW (ref >90)
GFR calc non Af Amer: 48 mL/min — ABNORMAL LOW (ref >90)
Glucose, Bld: 125 mg/dL — ABNORMAL HIGH (ref 70–99)
Potassium: 3.3 mmol/L — ABNORMAL LOW (ref 3.5–5.1)
Sodium: 134 mmol/L — ABNORMAL LOW (ref 135–145)

## 2014-10-27 LAB — GLUCOSE, CAPILLARY
GLUCOSE-CAPILLARY: 126 mg/dL — AB (ref 70–99)
Glucose-Capillary: 163 mg/dL — ABNORMAL HIGH (ref 70–99)
Glucose-Capillary: 139 mg/dL — ABNORMAL HIGH (ref 70–99)
Glucose-Capillary: 139 mg/dL — ABNORMAL HIGH (ref 70–99)

## 2014-10-27 MED ORDER — POTASSIUM CHLORIDE CRYS ER 20 MEQ PO TBCR
40.0000 meq | EXTENDED_RELEASE_TABLET | Freq: Once | ORAL | Status: AC
  Administered 2014-10-27: 40 meq via ORAL
  Filled 2014-10-27: qty 2

## 2014-10-27 MED ORDER — FLUTICASONE PROPIONATE 50 MCG/ACT NA SUSP
2.0000 | Freq: Two times a day (BID) | NASAL | Status: DC
  Administered 2014-10-27 – 2014-10-31 (×9): 2 via NASAL
  Filled 2014-10-27: qty 16

## 2014-10-27 MED ORDER — OXYMETAZOLINE HCL 0.05 % NA SOLN
2.0000 | Freq: Two times a day (BID) | NASAL | Status: AC
  Administered 2014-10-27 – 2014-10-30 (×8): 2 via NASAL
  Filled 2014-10-27: qty 15

## 2014-10-27 MED ORDER — LORATADINE 10 MG PO TABS
10.0000 mg | ORAL_TABLET | Freq: Every day | ORAL | Status: DC
  Administered 2014-10-27 – 2014-10-31 (×5): 10 mg via ORAL
  Filled 2014-10-27 (×5): qty 1

## 2014-10-27 MED ORDER — SALINE SPRAY 0.65 % NA SOLN
2.0000 | Freq: Four times a day (QID) | NASAL | Status: DC
  Administered 2014-10-27 – 2014-10-31 (×16): 2 via NASAL
  Filled 2014-10-27: qty 44

## 2014-10-27 MED ORDER — PANTOPRAZOLE SODIUM 40 MG PO TBEC
40.0000 mg | DELAYED_RELEASE_TABLET | Freq: Every day | ORAL | Status: DC
  Administered 2014-10-27 – 2014-10-31 (×5): 40 mg via ORAL
  Filled 2014-10-27 (×5): qty 1

## 2014-10-27 NOTE — Progress Notes (Signed)
Clinical Social Work Department BRIEF PSYCHOSOCIAL ASSESSMENT 10/27/2014  Patient:  Dawn Montoya, Dawn Montoya     Account Number:  [id number]     Admit date:  10/24/2014  Clinical Social Worker:  Renold Genta  Date/Time:  10/27/2014 03:59 PM  Referred by:  Physician  Date Referred:  10/27/2014 Referred for  SNF Placement   Other Referral:   Interview type:  Patient Other interview type:   and grandaughter, [name] via phone    PSYCHOSOCIAL DATA Living Status:  WITH ADULT CHILDREN Admitted from facility:   Level of care:   Primary support name:  [name] (grandaughter) h#: [phone]  c#: [phone] Primary support relationship to patient:  FAMILY Degree of support available:   good    CURRENT CONCERNS Current Concerns  Post-Acute Placement   Other Concerns:    SOCIAL WORK ASSESSMENT / PLAN CSW reviewed PT evaluation recommending SNF at discharge.   Assessment/plan status:  Information/Referral to [employer] Other assessment/ plan:   Information/referral to community resources:   CSW completed FL2 and faxed information out to [facility] - provided bed offers at bedside & emailed list to grandaughter.    PATIENT'S/FAMILY'S RESPONSE TO PLAN OF CARE: CSW spoke with patient re: discharge planning, but patient deferred decision making to grandaughter. CSW called grandaughter who is agreeable with plan for SNF - emailed list of facilities and bed offers, grandaughter states that she plans to visit tomorrow & will call CSW when at bedside to review SNFs.           Raynaldo Opitz, [facility] Clinical Social Worker cell #: [phone]

## 2014-10-27 NOTE — Progress Notes (Signed)
PULMONARY / CRITICAL CARE MEDICINE   Name: Dawn Montoya MRN: [id number] DOB: 31-Oct-1931    ADMISSION DATE:  10/24/2014 CONSULTATION DATE:  1/2  REFERRING MD :  Rachel Moulds  CHIEF COMPLAINT:  Shortness of breath  INITIAL PRESENTATION: 81 yo female with COPD and CHF admitted on 1/1 with acute hypoxemic respiratory failure due to a COPD exacerbation and CHF exacerbation.  She required BIPAP overnight on the first day of admission.  STUDIES:    SIGNIFICANT EVENTS:   SUBJECTIVE:  Not feeling much better. Has sig cough w/ PND. Very SOB w/ minimal exertion  VITAL SIGNS: Temp:  [97.9 F (36.6 C)-98.7 F (37.1 C)] 97.9 F (36.6 C) (01/04 0526) Pulse Rate:  [67-94] 94 (01/04 0801) Resp:  [18-22] 20 (01/04 0801) BP: (114-138)/(53-85) 114/85 mmHg (01/04 0526) SpO2:  [89 %-99 %] 96 % (01/04 0806) Weight:  [114.8 kg (253 lb 1.4 oz)] 114.8 kg (253 lb 1.4 oz) (01/04 0526) 4 liters    INTAKE / OUTPUT:  Intake/Output Summary (Last 24 hours) at 10/27/14 1020 Last data filed at 10/27/14 0758  Gross per 24 hour  Intake    480 ml  Output   1300 ml  Net   -820 ml    PHYSICAL EXAMINATION: General:  No acute distress, but c/o sig cough and PND Neuro:  A&Ox4, MAEW HEENT:  NCAT, EOMi, + sinus congestion. No thrush  Cardiovascular:  RRR, no mgr Lungs:  Bibasilar posterior rales, gets SOB just sitting up for posterior exam, exp wheeze w/ predominant upper airway component Abdomen:  BS+, soft, nontender Musculoskeletal:  Warm, normal bulk and tone Skin:  No rash or skin breakdown Ext: chronic appearing edema in legs bilaterally  LABS:  CBC  Recent Labs Lab 10/25/14 0235 10/26/14 0418 10/27/14 0439  WBC 10.2 10.3 12.0*  HGB 12.0 11.9* 13.1  HCT 37.3 38.9 42.4  PLT 215 277 317   Coag's No results for input(s): APTT, INR in the last 168 hours. BMET  Recent Labs Lab 10/26/14 0418 10/26/14 0831 10/27/14 0439  NA 132* 134* 134*  K 3.9 4.3 3.3*  CL 91* 94* 91*  CO2 32 31  33*  BUN 31* 32* 35*  CREATININE 0.99 0.98 1.06  GLUCOSE 173* 130* 125*   Electrolytes  Recent Labs Lab 10/26/14 0418 10/26/14 0831 10/27/14 0439  CALCIUM 8.5 8.6 8.6   Sepsis Markers No results for input(s): LATICACIDVEN, PROCALCITON, O2SATVEN in the last 168 hours. ABG  Recent Labs Lab 10/24/14 2205 10/25/14 0420  PHART 7.260* 7.401  PCO2ART 65.7* 49.6*  PO2ART 492.0* 76.1*   Liver Enzymes  Recent Labs Lab 10/24/14 2107 10/25/14 0235  AST 42* 35  ALT 40* 36*  ALKPHOS 91 82  BILITOT 1.0 0.6  ALBUMIN 3.8 3.5   Cardiac Enzymes  Recent Labs Lab 10/25/14 1414 10/26/14 0418 10/27/14 0439  TROPONINI 0.06* 0.04* 0.04*   Glucose  Recent Labs Lab 10/25/14 2133 10/26/14 0810 10/26/14 1152 10/26/14 1650 10/26/14 2120 10/27/14 0747  GLUCAP 175* 128* 125* 118* 104* 126*    Imaging No results found.   ASSESSMENT / PLAN:   Acute on chronic respiratory failure with hypoxemia due to COPD and CHF exacerbation COPD on 2L O2 Probable sinusitis w/ significant PND and persistent cough. This seems to be adding some upper airway component to her symptom burden.  Granddaughter notes non-compliance with therapy at home, specifically Spiriva P:   Wean prednisone over 7-10d Continue pulmicort 0.5mg  q12h, brovana Change SABA to PR Continue Tiotropium, RT to  educate on proper use Add flutter  Add nasal saline/afrin and steroid.  Add antihistamine Repeat CXR in am Complete 7d levaquin  Will arrange pulmonary follow up  Acute decompensated diastolic heart failure> now improved after 2 doses of lasix P:  Maintain current afterload regimen including ACE-Inhibitor Would use home dose of lasix Extra dose potassium on 1/4  Anxiety contributing to sensation of dyspnea P: Increased Buspar-->monitor for clinical effect    Baltazar Apo, MD, PhD 10/27/2014, 10:57 AM [facility] [phone] or if no answer [phone]

## 2014-10-27 NOTE — Progress Notes (Signed)
Clinical Social Work Department CLINICAL SOCIAL WORK PLACEMENT NOTE 10/27/2014  Patient:  Dawn Montoya, Dawn Montoya  Account Number:  [id number] Admit date:  10/24/2014  Clinical Social Worker:  Renold Genta  Date/time:  10/27/2014 04:07 PM  Clinical Social Work is seeking post-discharge placement for this patient at the following level of care:   SKILLED NURSING   (*CSW will update this form in Epic as items are completed)   10/27/2014  Patient/family provided with [place] Department of Clinical Social Work's list of facilities offering this level of care within the geographic area requested by the patient (or if unable, by the patient's family).  10/27/2014  Patient/family informed of their freedom to choose among providers that offer the needed level of care, that participate in Medicare, Medicaid or managed care program needed by the patient, have an available bed and are willing to accept the patient.  10/27/2014  Patient/family informed of [facility]' ownership interest in [facility], as well as of the fact that they are under no obligation to receive care at this facility.  PASARR submitted to EDS on 10/27/2014 PASARR number received on 10/27/2014  FL2 transmitted to all facilities in geographic area requested by pt/family on  10/27/2014 FL2 transmitted to all facilities within larger geographic area on   Patient informed that his/her managed care company has contracts with or will negotiate with  certain facilities, including the following:     Patient/family informed of bed offers received:  10/27/2014 Patient chooses bed at  Physician recommends and patient chooses bed at    Patient to be transferred to  on   Patient to be transferred to facility by  Patient and family notified of transfer on  Name of family member notified:    The following physician request were entered in Epic:   Additional Comments:   Raynaldo Opitz, [place] Social Worker cell #: [phone]

## 2014-10-27 NOTE — Evaluation (Signed)
Physical Therapy Evaluation Patient Details Name: Dawn Montoya MRN: [id number] DOB: [birth year] Today's Date: 10/27/2014   History of Present Illness  This 81 y.o. female admitted with acute hypoxemic respiratory failure due to COPD exacerbation and CHF exacerbation.  PMH includes:  COPD; breast CA; HTN; PNA; Diastolic CHF;   Clinical Impression  Pt admitted with dx of resp failure and currently presenting with functional mobility limitations 2* generalized weakness, SOB with exertion, obesity and ambulatory balance deficits.  Pt would benefit from follow up rehab at SNF level to maximize IND and safety prior to return home.    Follow Up Recommendations SNF    Equipment Recommendations  None recommended by PT    Recommendations for Other Services OT consult     Precautions / Restrictions Precautions Precautions: Fall Restrictions Weight Bearing Restrictions: No      Mobility  Bed Mobility Overal bed mobility: Needs Assistance Bed Mobility: Supine to Sit     Supine to sit: Min assist;HOB elevated     General bed mobility comments: requires encouragement, and min A to lift trunk and to scoot hips to EOB  Transfers Overall transfer level: Needs assistance Equipment used: Rolling walker (2 wheeled) Transfers: Sit to/from Stand Sit to Stand: Min assist Stand pivot transfers: Min assist;+2 safety/equipment       General transfer comment: Pt very fearful of falling.  requires assist to steady   Ambulation/Gait Ambulation/Gait assistance: Min assist;+2 physical assistance;+2 safety/equipment Ambulation Distance (Feet): 40 Feet (and 5') Assistive device: Rolling walker (2 wheeled) Gait Pattern/deviations: Step-through pattern;Decreased step length - right;Decreased step length - left;Shuffle;Trunk flexed Gait velocity: decr with multiple rests required 2* SOB   General Gait Details: cues for posture, pacing, position from RW and breathing technique  Stairs             Wheelchair Mobility    Modified Rankin (Stroke Patients Only)       Balance Overall balance assessment: Needs assistance Sitting-balance support: Feet supported Sitting balance-Leahy Scale: Good       Standing balance-Leahy Scale: Poor Standing balance comment: reliant on UE support                             Pertinent Vitals/Pain Pain Assessment: No/denies pain    Home Living Family/patient expects to be discharged to:: Private residence Living Arrangements: Children Available Help at Discharge: Family;Available 24 hours/day Type of Home: House Home Access: Stairs to enter Entrance Stairs-Rails: Right Entrance Stairs-Number of Steps: 2 Home Layout: One level Home Equipment: Walker - 2 wheels;Cane - single point Additional Comments: Pt reports she has been sponge bathing because she can't get step over the tub.  Is 02 dependent and uses 2L at home.     Prior Function Level of Independence: Needs assistance   Gait / Transfers Assistance Needed: Uses RW all the time   ADL's / Homemaking Assistance Needed: Pt reports she is able to perform bathing and dressing mod I; but requires assist with perineal hygiene        Hand Dominance   Dominant Hand: Right    Extremity/Trunk Assessment   Upper Extremity Assessment: Generalized weakness           Lower Extremity Assessment: Generalized weakness         Communication   Communication: HOH  Cognition Arousal/Alertness: Awake/alert Behavior During Therapy: Anxious Overall Cognitive Status: Within Functional Limits for tasks assessed  General Comments General comments (skin integrity, edema, etc.): Vitals supine:  BP 116/61, HR 64; 02 sats 93% on 3L; Sitting BP 120/56 (pt with c/o dizziness; sats 91%.  Pt with drop in 02 sats after activity to 85% on 5L.  She was instructed in pursed lip breathing with sats improving to 93% after ~2.5 mins    Exercises         Assessment/Plan    PT Assessment Patient needs continued PT services  PT Diagnosis Difficulty walking;Generalized weakness   PT Problem List Decreased strength;Decreased activity tolerance;Decreased balance;Decreased mobility;Decreased knowledge of use of DME;Obesity  PT Treatment Interventions DME instruction;Gait training;Functional mobility training;Therapeutic activities;Therapeutic exercise;Patient/family education;Balance training   PT Goals (Current goals can be found in the Care Plan section) Acute Rehab PT Goals Patient Stated Goal: to get stronger PT Goal Formulation: With patient Time For Goal Achievement: 11/10/14 Potential to Achieve Goals: Fair    Frequency Min 3X/week   Barriers to discharge        Co-evaluation PT/OT/SLP Co-Evaluation/Treatment: Yes Reason for Co-Treatment: For patient/therapist safety PT goals addressed during session: Mobility/safety with mobility OT goals addressed during session: ADL's and self-care       End of Session Equipment Utilized During Treatment: Gait belt;Oxygen Activity Tolerance: Patient limited by fatigue Patient left: in chair;with call bell/phone within reach Nurse Communication: Mobility status         Time: 4315-4008 PT Time Calculation (min) (ACUTE ONLY): 62 min   Charges:   PT Evaluation $Initial PT Evaluation Tier I: 1 Procedure PT Treatments $Gait Training: 8-22 mins $Therapeutic Activity: 8-22 mins   PT G Codes:        Mccartney Chuba 05-Nov-2014, 2:11 PM

## 2014-10-27 NOTE — Progress Notes (Signed)
[facility] PROGRESS NOTE  Dawn Montoya ZGY:[id number] DOB: December 13, 1931 DOA: 10/24/2014  PCP: Bartholome Bill, MD  Brief HPI: 81 year old Caucasian female with a history of COPD on home oxygen, diastolic CHF, hypothyroidism, presented to the emergency department with worsening shortness of breath. She was found to be in hypercapnic respiratory failure. Chest x-ray raised the possibility of vascular congestion. She was admitted and placed on BiPAP.  Past medical history:  Past Medical History  Diagnosis Date  . COPD (chronic obstructive pulmonary disease)   . Breast CA   . Hypertension   . HTN (hypertension) 01/18/2014  . Hypothyroidism 01/18/2014  . Medical history non-contributory   . Shortness of breath   . Pneumonia   . Diastolic CHF 03/01/1637    Consultants: Pulmonology  Procedures: None  Antibiotics: Levaquin  Subjective: Patient improving slowly. Still coughing with yellow sputum. Got short of breath when worked with PT.   Objective: Vital Signs  Filed Vitals:   10/26/14 2123 10/27/14 0526 10/27/14 0801 10/27/14 0806  BP: 138/53 114/85    Pulse: 81 86 94   Temp: 98 F (36.7 C) 97.9 F (36.6 C)    TempSrc: Oral Oral    Resp:  22 20   Height:      Weight:  114.8 kg (253 lb 1.4 oz)    SpO2: 94% 92% 89% 96%    Intake/Output Summary (Last 24 hours) at 10/27/14 1349 Last data filed at 10/27/14 0758  Gross per 24 hour  Intake    360 ml  Output   1050 ml  Net   -690 ml   Filed Weights   10/26/14 0000 10/26/14 0500 10/27/14 0526  Weight: 115.6 kg (254 lb 13.6 oz) 115.6 kg (254 lb 13.6 oz) 114.8 kg (253 lb 1.4 oz)    General appearance: alert, cooperative, appears stated age, no distress and moderately obese Resp: Improved air entry bilaterally. Very few scattered wheezes heard. Few crackles at the bases. No rhonchi. Cardio: regular rate and rhythm, S1, S2 normal, no murmur, click, rub or gallop GI: soft, non-tender; bowel sounds normal; no  masses,  no organomegaly Extremities: extremities normal, atraumatic, no cyanosis or edema Neurologic: Awake and alert. No focal neurological deficits.  Lab Results:  Basic Metabolic Panel:  Recent Labs Lab 10/24/14 2107 10/25/14 0235 10/26/14 0418 10/26/14 0831 10/27/14 0439  NA 133* 134* 132* 134* 134*  K 4.4 4.0 3.9 4.3 3.3*  CL 96 93* 91* 94* 91*  CO2 27 29 32 31 33*  GLUCOSE 136* 204* 173* 130* 125*  BUN 26* 27* 31* 32* 35*  CREATININE 0.84 0.87 0.99 0.98 1.06  CALCIUM 8.7 8.3* 8.5 8.6 8.6   Liver Function Tests:  Recent Labs Lab 10/24/14 2107 10/25/14 0235  AST 42* 35  ALT 40* 36*  ALKPHOS 91 82  BILITOT 1.0 0.6  PROT 7.5 7.0  ALBUMIN 3.8 3.5   CBC:  Recent Labs Lab 10/24/14 2107 10/25/14 0235 10/26/14 0418 10/27/14 0439  WBC 14.3* 10.2 10.3 12.0*  NEUTROABS 10.4* 9.7*  --   --   HGB 13.3 12.0 11.9* 13.1  HCT 41.6 37.3 38.9 42.4  MCV 89.7 89.2 90.5 92.0  PLT 270 215 277 317   Cardiac Enzymes:  Recent Labs Lab 10/25/14 0235 10/25/14 0739 10/25/14 1414 10/26/14 0418 10/27/14 0439  TROPONINI 0.10* 0.09* 0.06* 0.04* 0.04*   BNP (last 3 results)  Recent Labs  01/18/14 1532 01/19/14 0325 02/22/14 1130  PROBNP 484.2* 423.1 383.7   CBG:  Recent Labs Lab 10/26/14 1152 10/26/14 1650 10/26/14 2120 10/27/14 0747 10/27/14 1154  GLUCAP 125* 118* 104* 126* 163*    Recent Results (from the past 240 hour(s))  MRSA PCR Screening     Status: Abnormal   Collection Time: 10/25/14 12:39 AM  Result Value Ref Range Status   MRSA by PCR POSITIVE (A) NEGATIVE Final    Comment:        The GeneXpert MRSA Assay (FDA approved for NASAL specimens only), is one component of a comprehensive MRSA colonization surveillance program. It is not intended to diagnose MRSA infection nor to guide or monitor treatment for MRSA infections. RESULT CALLED TO, READ BACK BY AND VERIFIED WITH: SPOKE WITH FREI,L RN [phone] COVINGTON,N        Studies/Results: No results found.  Medications:  Scheduled: . antiseptic oral rinse  7 mL Mouth Rinse BID  . arformoterol  15 mcg Nebulization BID  . aspirin EC  325 mg Oral Daily  . budesonide (PULMICORT) nebulizer solution  0.5 mg Nebulization BID  . busPIRone  10 mg Oral TID  . Chlorhexidine Gluconate Cloth  6 each Topical Q0600  . enoxaparin (LOVENOX) injection  40 mg Subcutaneous Q24H  . sodium chloride  2 spray Each Nare QID   And  . oxymetazoline  2 spray Each Nare BID   And  . fluticasone  2 spray Each Nare BID  . furosemide  40 mg Oral Daily  . insulin aspart  0-15 Units Subcutaneous TID WC  . insulin aspart  0-5 Units Subcutaneous QHS  . levofloxacin (LEVAQUIN) IV  750 mg Intravenous Q24H  . levothyroxine  75 mcg Oral QAC breakfast  . lisinopril  10 mg Oral Daily  . loratadine  10 mg Oral Daily  . mupirocin ointment  1 application Nasal BID  . pantoprazole  40 mg Oral Q1200  . potassium chloride  20 mEq Oral Daily  . potassium chloride  40 mEq Oral Once  . predniSONE  40 mg Oral Q breakfast  . sodium chloride  3 mL Intravenous Q12H  . tiotropium  18 mcg Inhalation Daily   Continuous:  HQI:ONGEXBMWUXLKG **OR** acetaminophen, albuterol, benzonatate, dextromethorphan, ondansetron **OR** ondansetron (ZOFRAN) IV, oxyCODONE, phenol, traZODone  Assessment/Plan:  Principal Problem:   Acute respiratory failure Active Problems:   Hypothyroidism   COPD exacerbation   Acute on chronic diastolic heart failure   Uncontrolled hypertension   Acute respiratory failure with hypoxia   Acute respiratory failure with hypoxia This was most likely due to a combination of diastolic heart failure and COPD. However, appears to be mostly COPD exacerbation. She was given intravenous Lasix. Significantly improved. Off BiPAP. Continue oxygen by nasal cannula.  Acute COPD exacerbation. Improving. Now on oral steroids. Continue antibiotics and nebulizer treatments. Appreciate  pulmonology input. Continue other inhalers as well. Some concern for upper airway issues as well.  Hyperglycemia No known history of diabetes. Possibly from acute stress and steroid. Continue sliding scale coverage. HbA1c is 6.5.  Possible acute diastolic CHF. Last EF was 60-65% in March 2015. She was given intravenous Lasix. Now on oral Lasix. No need to repeat echocardiogram. Admission EKG was poor quality. Due to concerns for T wave changes EKG was repeated which was also not of great quality, but no T inversions noted on this EKG.  Accelerated hypertension. This was present in the emergency department. She was placed on nitroglycerin infusion, which was tapered off. Blood pressure is much better. She is on ACE inhibitor, which  will be continued.   History of hypothyroidism Continue with home medications. TSH 1.7.  DVT Prophylaxis: Lovenox    Code Status:. Full code  Family Communication: discussed with the patient. Tried calling [name] but number in chart is apparently disconnected. Disposition Plan: PT/OT to see.     LOS: 3 days   [facility] Pager [phone] 10/27/2014, 1:49 PM  If 7PM-7AM, please contact night-coverage at [email or web address], password [facility]

## 2014-10-27 NOTE — Evaluation (Signed)
Occupational Therapy Evaluation Patient Details Name: Dawn Montoya MRN: [id number] DOB: 1932/04/13 Today's Date: 10/27/2014    History of Present Illness This 81 y.o. female admitted with acute hypoxemic respiratory failure due to COPD exacerbation and CHF exacerbation.  PMH includes:  COPD; breast CA; HTN; PNA; Diastolic CHF;    Clinical Impression   Pt admitted with above. She demonstrates the below listed deficits and will benefit from continued OT to maximize safety and independence with BADLs.  Pt presents to OT with generalized weakness, and deconditioning.  She is very anxious and fearful of falling - require encouragement for max participation.  She currently, requires mod - max A for BADLs, and per her report was limited at home due to family not being able to help her advance her activity ("no one could help me walk outside").   Feel she would benefit from SNF level rehab before discharge home.       Follow Up Recommendations  SNF    Equipment Recommendations  3 in 1 bedside comode    Recommendations for Other Services       Precautions / Restrictions Precautions Precautions: Fall      Mobility Bed Mobility Overal bed mobility: Needs Assistance Bed Mobility: Supine to Sit     Supine to sit: Min assist;HOB elevated     General bed mobility comments: requires encouragement, and min A to lift trunk and to scoot hips to EOB  Transfers Overall transfer level: Needs assistance Equipment used: Rolling walker (2 wheeled) Transfers: Sit to/from [employer] Sit to Stand: Min assist Stand pivot transfers: Min assist;+2 safety/equipment       General transfer comment: Pt very fearful of falling.  requires assist to steady     Balance Overall balance assessment: Needs assistance Sitting-balance support: Feet supported Sitting balance-Leahy Scale: Good       Standing balance-Leahy Scale: Poor Standing balance comment: reliant on UE support                             ADL Overall ADL's : Needs assistance/impaired Eating/Feeding: Sitting;Set up   Grooming: Wash/dry hands;Wash/dry face;Oral care;Brushing hair;Set up;Sitting   Upper Body Bathing: Minimal assitance;Sitting   Lower Body Bathing: Maximal assistance;Sit to/from stand   Upper Body Dressing : Minimal assistance;Sitting   Lower Body Dressing: Total assistance;Sit to/from stand   Toilet Transfer: Minimal assistance;+2 for safety/equipment;Ambulation;BSC;RW   Toileting- Clothing Manipulation and Hygiene: Total assistance;Sit to/from stand       Functional mobility during ADLs: Minimal assistance;+2 for safety/equipment;Rolling walker General ADL Comments: Pt is very anxious.  requires encouragement for max participation.  She repeatedly states she is unable to do tasks and is very fearful of falling.  02 sats dropped to 85% on 5L with activity, but with pursed lip breathing, sats improved to 92% after ~2.5 mins.      Vision                     Perception     Praxis      Pertinent Vitals/Pain Pain Assessment: No/denies pain     Hand Dominance Right   Extremity/Trunk Assessment Upper Extremity Assessment Upper Extremity Assessment: Generalized weakness   Lower Extremity Assessment Lower Extremity Assessment: Defer to PT evaluation       Communication Communication Communication: HOH   Cognition   Behavior During Therapy: Anxious Overall Cognitive Status: Within Functional Limits for tasks assessed  General Comments       Exercises       Shoulder Instructions      Home Living Family/patient expects to be discharged to:: Private residence Living Arrangements: Children Available Help at Discharge: Family;Available 24 hours/day Type of Home: House Home Access: Stairs to enter [employer] of Steps: 2   Home Layout: One level     Bathroom Shower/Tub: Tub/shower unit Shower/tub  characteristics: Curtain Biochemist, clinical: Standard     Home Equipment: Environmental consultant - 2 wheels;Cane - single point   Additional Comments: Pt reports she has been sponge bathing because she can't get step over the tub.  Is 02 dependent and uses 2L at home.       Prior Functioning/Environment Level of Independence: Needs assistance  Gait / Transfers Assistance Needed: Uses RW all the time  ADL's / Homemaking Assistance Needed: Pt reports she is able to perform bathing and dressing mod I; but requires assist with perineal hygiene        OT Diagnosis: Generalized weakness   OT Problem List: Decreased strength;Decreased activity tolerance;Impaired balance (sitting and/or standing);Decreased safety awareness;Decreased knowledge of use of DME or AE;Cardiopulmonary status limiting activity;Obesity   OT Treatment/Interventions: Self-care/ADL training;Energy conservation;DME and/or AE instruction;Therapeutic activities;Patient/family education;Balance training    OT Goals(Current goals can be found in the care plan section) Acute Rehab OT Goals Patient Stated Goal: to get stronger OT Goal Formulation: With patient Time For Goal Achievement: 11/10/14 Potential to Achieve Goals: Good ADL Goals Pt Will Perform Grooming: with min guard assist;standing Pt Will Perform Upper Body Bathing: with set-up;sitting Pt Will Perform Lower Body Bathing: with min assist;sit to/from stand Pt Will Perform Upper Body Dressing: with supervision;sitting Pt Will Perform Lower Body Dressing: with min assist;sit to/from stand Pt Will Transfer to Toilet: with min guard assist;ambulating;regular height toilet;bedside commode;grab bars Pt Will Perform Toileting - Clothing Manipulation and hygiene: with min guard assist;sit to/from stand  OT Frequency: Min 2X/week   Barriers to D/C: Decreased caregiver support  unsure that family can provide necessary level of assist       Co-evaluation PT/OT/SLP  Co-Evaluation/Treatment: Yes Reason for Co-Treatment: For patient/therapist safety   OT goals addressed during session: ADL's and self-care      End of Session Equipment Utilized During Treatment: Rolling walker;Oxygen Nurse Communication: Mobility status  Activity Tolerance: Patient limited by fatigue Patient left: in chair;with call bell/phone within reach   Time: 1103-1208 OT Time Calculation (min): 65 min Charges:  OT General Charges $OT Visit: 1 Procedure OT Evaluation $Initial OT Evaluation Tier I: 1 Procedure OT Treatments $Self Care/Home Management : 23-37 mins G-Codes:    Ayde Record M 11-15-14, 12:34 PM

## 2014-10-28 ENCOUNTER — Inpatient Hospital Stay (HOSPITAL_COMMUNITY): Payer: Medicare Other

## 2014-10-28 LAB — BASIC METABOLIC PANEL
Anion gap: 7 (ref 5–15)
BUN: 27 mg/dL — ABNORMAL HIGH (ref 6–23)
CALCIUM: 8.6 mg/dL (ref 8.4–10.5)
CHLORIDE: 94 meq/L — AB (ref 96–112)
CO2: 34 mmol/L — AB (ref 19–32)
Creatinine, Ser: 0.94 mg/dL (ref 0.50–1.10)
GFR calc Af Amer: 65 mL/min — ABNORMAL LOW (ref >90)
GFR calc non Af Amer: 56 mL/min — ABNORMAL LOW (ref >90)
GLUCOSE: 134 mg/dL — AB (ref 70–99)
Potassium: 3.7 mmol/L (ref 3.5–5.1)
SODIUM: 135 mmol/L (ref 135–145)

## 2014-10-28 LAB — MAGNESIUM: Magnesium: 2.1 mg/dL (ref 1.5–2.5)

## 2014-10-28 LAB — GLUCOSE, CAPILLARY
GLUCOSE-CAPILLARY: 128 mg/dL — AB (ref 70–99)
GLUCOSE-CAPILLARY: 159 mg/dL — AB (ref 70–99)
GLUCOSE-CAPILLARY: 146 mg/dL — AB (ref 70–99)
GLUCOSE-CAPILLARY: 103 mg/dL — AB (ref 70–99)

## 2014-10-28 MED ORDER — POLYETHYLENE GLYCOL 3350 17 G PO PACK
17.0000 g | PACK | Freq: Every day | ORAL | Status: DC
  Administered 2014-10-28 – 2014-10-30 (×2): 17 g via ORAL
  Filled 2014-10-28 (×4): qty 1

## 2014-10-28 MED ORDER — DOCUSATE SODIUM 100 MG PO CAPS
100.0000 mg | ORAL_CAPSULE | Freq: Two times a day (BID) | ORAL | Status: DC
  Administered 2014-10-28 – 2014-10-30 (×3): 100 mg via ORAL
  Filled 2014-10-28 (×8): qty 1

## 2014-10-28 MED ORDER — LEVOFLOXACIN 750 MG PO TABS
750.0000 mg | ORAL_TABLET | Freq: Every day | ORAL | Status: DC
  Administered 2014-10-28 – 2014-10-30 (×3): 750 mg via ORAL
  Filled 2014-10-28 (×4): qty 1

## 2014-10-28 NOTE — Progress Notes (Signed)
[facility] PROGRESS NOTE  Dawn Montoya [id number]:[id number] DOB: 05-03-1932 DOA: 10/24/2014  PCP: Bartholome Bill, MD  Brief HPI: 81 year old Caucasian female with a history of COPD on home oxygen, diastolic CHF, hypothyroidism, presented to the emergency department with worsening shortness of breath. She was found to be in hypercapnic respiratory failure. Chest x-ray raised the possibility of vascular congestion. She was admitted and placed on BiPAP. She stabilized and was subsequently transferred to the floor.  Past medical history:  Past Medical History  Diagnosis Date  . COPD (chronic obstructive pulmonary disease)   . Breast CA   . Hypertension   . HTN (hypertension) 01/18/2014  . Hypothyroidism 01/18/2014  . Medical history non-contributory   . Shortness of breath   . Pneumonia   . Diastolic CHF 02/24/2991    Consultants: Pulmonology  Procedures: None  Antibiotics: Levaquin. Change to oral today.  Subjective: Patient feels constipated. Breathing is much better.   Objective: Vital Signs  Filed Vitals:   10/27/14 2056 10/27/14 2109 10/28/14 0519 10/28/14 0931  BP:  146/71 118/54   Pulse:  86 82   Temp:  98.3 F (36.8 C) 98.3 F (36.8 C)   TempSrc:  Oral Oral   Resp:  20 20   Height:      Weight:   114.3 kg (251 lb 15.8 oz)   SpO2: 90% 95% 92% 94%    Intake/Output Summary (Last 24 hours) at 10/28/14 1027 Last data filed at 10/28/14 0757  Gross per 24 hour  Intake    720 ml  Output   1600 ml  Net   -880 ml   Filed Weights   10/26/14 0500 10/27/14 0526 10/28/14 0519  Weight: 115.6 kg (254 lb 13.6 oz) 114.8 kg (253 lb 1.4 oz) 114.3 kg (251 lb 15.8 oz)    General appearance: alert, cooperative, appears stated age, no distress and moderately obese Resp: Much Improved air entry bilaterally. Very few scattered wheezes heard. Few crackles at the bases. No rhonchi. Cardio: regular rate and rhythm, S1, S2 normal, no murmur, click, rub or gallop GI: soft,  non-tender; bowel sounds normal; no masses,  no organomegaly Extremities: extremities normal, atraumatic, no cyanosis or edema Neurologic: Awake and alert. No focal neurological deficits.  Lab Results:  Basic Metabolic Panel:  Recent Labs Lab 10/25/14 0235 10/26/14 0418 10/26/14 0831 10/27/14 0439 10/28/14 0415  NA 134* 132* 134* 134* 135  K 4.0 3.9 4.3 3.3* 3.7  CL 93* 91* 94* 91* 94*  CO2 29 32 31 33* 34*  GLUCOSE 204* 173* 130* 125* 134*  BUN 27* 31* 32* 35* 27*  CREATININE 0.87 0.99 0.98 1.06 0.94  CALCIUM 8.3* 8.5 8.6 8.6 8.6  MG  --   --   --   --  2.1   Liver Function Tests:  Recent Labs Lab 10/24/14 2107 10/25/14 0235  AST 42* 35  ALT 40* 36*  ALKPHOS 91 82  BILITOT 1.0 0.6  PROT 7.5 7.0  ALBUMIN 3.8 3.5   CBC:  Recent Labs Lab 10/24/14 2107 10/25/14 0235 10/26/14 0418 10/27/14 0439  WBC 14.3* 10.2 10.3 12.0*  NEUTROABS 10.4* 9.7*  --   --   HGB 13.3 12.0 11.9* 13.1  HCT 41.6 37.3 38.9 42.4  MCV 89.7 89.2 90.5 92.0  PLT 270 215 277 317   Cardiac Enzymes:  Recent Labs Lab 10/25/14 0235 10/25/14 0739 10/25/14 1414 10/26/14 0418 10/27/14 0439  TROPONINI 0.10* 0.09* 0.06* 0.04* 0.04*   BNP (last 3  results)  Recent Labs  01/18/14 1532 01/19/14 0325 02/22/14 1130  PROBNP 484.2* 423.1 383.7   CBG:  Recent Labs Lab 10/27/14 0747 10/27/14 1154 10/27/14 1701 10/27/14 2115 10/28/14 0714  GLUCAP 126* 163* 139* 139* 128*    Recent Results (from the past 240 hour(s))  MRSA PCR Screening     Status: Abnormal   Collection Time: 10/25/14 12:39 AM  Result Value Ref Range Status   MRSA by PCR POSITIVE (A) NEGATIVE Final    Comment:        The GeneXpert MRSA Assay (FDA approved for NASAL specimens only), is one component of a comprehensive MRSA colonization surveillance program. It is not intended to diagnose MRSA infection nor to guide or monitor treatment for MRSA infections. RESULT CALLED TO, READ BACK BY AND VERIFIED  WITH: SPOKE WITH FREI,L RN [phone] COVINGTON,N       Studies/Results: Dg Chest Port 1 View  10/28/2014   CLINICAL DATA:  Pulmonary edema  EXAM: PORTABLE CHEST - 1 VIEW  COMPARISON:  10/24/2014  FINDINGS: Improvement in vascular congestion. Negative for edema or effusion. Heart size upper normal.  Left lower lobe airspace disease better seen on today's study and perhaps slightly increased from the prior study. Possible atelectasis or pneumonia. Follow-up recommended.  Left upper lobe nodule is unchanged from prior studies and is probably calcified.  IMPRESSION: Interval improvement in pulmonary vascular congestion.  Mild progression of left lower lobe atelectasis/infiltrate, possible pneumonia.   Electronically Signed   By: Franchot Gallo M.D.   On: 10/28/2014 07:14    Medications:  Scheduled: . antiseptic oral rinse  7 mL Mouth Rinse BID  . arformoterol  15 mcg Nebulization BID  . aspirin EC  325 mg Oral Daily  . budesonide (PULMICORT) nebulizer solution  0.5 mg Nebulization BID  . busPIRone  10 mg Oral TID  . Chlorhexidine Gluconate Cloth  6 each Topical Q0600  . docusate sodium  100 mg Oral BID  . enoxaparin (LOVENOX) injection  40 mg Subcutaneous Q24H  . sodium chloride  2 spray Each Nare QID   And  . oxymetazoline  2 spray Each Nare BID   And  . fluticasone  2 spray Each Nare BID  . furosemide  40 mg Oral Daily  . insulin aspart  0-15 Units Subcutaneous TID WC  . insulin aspart  0-5 Units Subcutaneous QHS  . levofloxacin  750 mg Oral Daily  . levothyroxine  75 mcg Oral QAC breakfast  . lisinopril  10 mg Oral Daily  . loratadine  10 mg Oral Daily  . mupirocin ointment  1 application Nasal BID  . pantoprazole  40 mg Oral Q1200  . polyethylene glycol  17 g Oral Daily  . potassium chloride  20 mEq Oral Daily  . predniSONE  40 mg Oral Q breakfast  . sodium chloride  3 mL Intravenous Q12H  . tiotropium  18 mcg Inhalation Daily   Continuous:  [facility]:[id number] **OR**  acetaminophen, albuterol, benzonatate, ondansetron **OR** ondansetron (ZOFRAN) IV, oxyCODONE, phenol, traZODone  Assessment/Plan:  Principal Problem:   Acute respiratory failure Active Problems:   Hypothyroidism   COPD exacerbation   Acute on chronic diastolic heart failure   Uncontrolled hypertension   Acute respiratory failure with hypoxia    Acute respiratory failure with hypoxia This was most likely due to a combination of diastolic heart failure and COPD. However, appears to be mostly COPD exacerbation. She was given intravenous Lasix. Significantly improved. Off BiPAP. Continue oxygen by  nasal cannula.  Acute COPD exacerbation. Improving. Now on oral steroids. Continue antibiotics and nebulizer treatments. Change to oral antibiotics. Appreciate pulmonology input. Continue other inhalers as well. Some concern for upper airway issues as well.  Hyperglycemia No known history of diabetes. Possibly from acute stress and steroid. Continue sliding scale coverage. HbA1c is 6.5. CBGs are improved.  Possible acute diastolic CHF with mildly elevated troponin Last EF was 60-65% in March 2015. She was given intravenous Lasix. Now on oral Lasix. No need to repeat echocardiogram. Admission EKG was poor quality. Due to concerns for T wave changes EKG was repeated which was also not of great quality, but no T inversions noted on this EKG. Elevated troponin, most likely due to mild demand ischemia from respiratory failure rather than a de novo cardiac process. Continue aspirin. No further evaluation is necessary.  Accelerated hypertension. This was present in the emergency department. She was placed on nitroglycerin infusion, which was tapered off. Blood pressure is much better. She is on ACE inhibitor, which will be continued.   History of hypothyroidism Continue with home medications. TSH 1.7.  Give laxatives and stool softeners for constipation.  DVT Prophylaxis: Lovenox    Code Status:.  Full code  Family Communication: discussed with the patient. Discussed with her granddaughter yesterday. Disposition Plan: PT/OT is following. Will need SNF. Will discontinue Foley today. Hopefully she can be discharged in the next 1-2 days.    LOS: 4 days   [facility] Pager [phone] 10/28/2014, 10:27 AM  If 7PM-7AM, please contact night-coverage at [email or web address], password [facility]

## 2014-10-28 NOTE — Progress Notes (Signed)
PULMONARY / CRITICAL CARE MEDICINE   Name: Dawn Montoya MRN: [id number] DOB: 1932-01-19    ADMISSION DATE:  10/24/2014 CONSULTATION DATE:  1/2  REFERRING MD :  Rachel Moulds  CHIEF COMPLAINT:  Shortness of breath  INITIAL PRESENTATION: 81 yo female with COPD and CHF admitted on 1/1 with acute hypoxemic respiratory failure due to a COPD exacerbation and CHF exacerbation.  She required BIPAP overnight on the first day of admission.  STUDIES:    SIGNIFICANT EVENTS:   SUBJECTIVE:  Looks better. Cough improved.   VITAL SIGNS: Temp:  [98 F (36.7 C)-98.3 F (36.8 C)] 98.3 F (36.8 C) (01/05 0519) Pulse Rate:  [82-94] 82 (01/05 0519) Resp:  [20-24] 20 (01/05 0519) BP: (118-158)/(54-75) 118/54 mmHg (01/05 0519) SpO2:  [90 %-95 %] 94 % (01/05 0931) Weight:  [114.3 kg (251 lb 15.8 oz)] 114.3 kg (251 lb 15.8 oz) (01/05 0519) 4 liters    INTAKE / OUTPUT:  Intake/Output Summary (Last 24 hours) at 10/28/14 1024 Last data filed at 10/28/14 0757  Gross per 24 hour  Intake    720 ml  Output   1600 ml  Net   -880 ml    PHYSICAL EXAMINATION: General:  No acute distress, multiple complaints, but more related to medications and situation and not so much breathing or cough today  Neuro:  A&Ox4, MAEW HEENT:  NCAT, EOMi, + sinus congestion. No thrush -->congestion improved Cardiovascular:  RRR, no mgr Lungs:  Exertional dyspnea improved,  exp wheeze w/ predominant upper airway component-> a little better c/w 1/4 Abdomen:  BS+, soft, nontender Musculoskeletal:  Warm, normal bulk and tone Skin:  No rash or skin breakdown Ext: chronic appearing edema in legs bilaterally  LABS:  CBC  Recent Labs Lab 10/25/14 0235 10/26/14 0418 10/27/14 0439  WBC 10.2 10.3 12.0*  HGB 12.0 11.9* 13.1  HCT 37.3 38.9 42.4  PLT 215 277 317   Coag's No results for input(s): APTT, INR in the last 168 hours. BMET  Recent Labs Lab 10/26/14 0831 10/27/14 0439 10/28/14 0415  NA 134* 134* 135  K  4.3 3.3* 3.7  CL 94* 91* 94*  CO2 31 33* 34*  BUN 32* 35* 27*  CREATININE 0.98 1.06 0.94  GLUCOSE 130* 125* 134*   Electrolytes  Recent Labs Lab 10/26/14 0831 10/27/14 0439 10/28/14 0415  CALCIUM 8.6 8.6 8.6  MG  --   --  2.1   Sepsis Markers No results for input(s): LATICACIDVEN, PROCALCITON, O2SATVEN in the last 168 hours. ABG  Recent Labs Lab 10/24/14 2205 10/25/14 0420  PHART 7.260* 7.401  PCO2ART 65.7* 49.6*  PO2ART 492.0* 76.1*   Liver Enzymes  Recent Labs Lab 10/24/14 2107 10/25/14 0235  AST 42* 35  ALT 40* 36*  ALKPHOS 91 82  BILITOT 1.0 0.6  ALBUMIN 3.8 3.5   Cardiac Enzymes  Recent Labs Lab 10/25/14 1414 10/26/14 0418 10/27/14 0439  TROPONINI 0.06* 0.04* 0.04*   Glucose  Recent Labs Lab 10/26/14 2120 10/27/14 0747 10/27/14 1154 10/27/14 1701 10/27/14 2115 10/28/14 0714  GLUCAP 104* 126* 163* 139* 139* 128*    Imaging No results found. LLL ATX. Improved aeration. LUL nodule unchanged  ASSESSMENT / PLAN:   Acute on chronic respiratory failure with hypoxemia due to COPD and CHF exacerbation COPD on 2L O2 Probable sinusitis w/ significant PND and persistent cough. This seems to be adding some upper airway component to her symptom burden.  LUL nodule.  Granddaughter notes non-compliance with therapy at home, specifically Spiriva  P:   Wean prednisone over 7-10d Continue pulmicort 0.5mg  q12h, brovana (avoid any powdered inhalers) Change SABA to PRN Change Spiriva to Spiriva Respimat at discharge.  Added flutter  Added nasal saline/afrin and steroid.   (stop the afrin after 4 d) Added antihistamine Complete 7d levaquin  Consider CT chest at some point to eval LUL nodule. Although at 81 years old w/ minimal activity tolerance likely little to do about this Have set her up w/ Dr [place] Bells Jan 22 at 245pm   Acute decompensated diastolic heart failure> now improved after 2 doses of lasix P:  Maintain current afterload regimen  including ACE-Inhibitor Would use home dose of lasix Extra dose potassium on 1/4  Anxiety contributing to sensation of dyspnea P: Increased Buspar-->monitor for clinical effect   We will s/o. Appointment for f/u arranged  Agree w/ consideration for SNF for rehab.   Erick Colace ACNP-BC [place] Pager # [phone] OR # [phone] if no answer  Attending Note:  I have examined patient, reviewed labs, studies and notes. I have discussed the case with Jerrye Bushy and I agree with the data and plans as amended above.   Baltazar Apo, MD, PhD 10/28/2014, 5:49 PM [facility] [phone] or if no answer [phone]

## 2014-10-28 NOTE — Care Management Note (Addendum)
    Page 1 of 1   10/31/2014     1:45:23 PM CARE MANAGEMENT NOTE 10/31/2014  Patient:  Dawn Montoya, Dawn Montoya   Account Number:  [id number]  Date Initiated:  10/28/2014  Documentation initiated by:  Dessa Phi  Subjective/Objective Assessment:   81 y/o f admitted w/acute respiratory failure.     Action/Plan:   From home.   Anticipated DC Date:  10/31/2014   Anticipated DC Plan:  [place]  CM consult      Choice offered to / List presented to:             Status of service:  In process, will continue to follow Medicare Important Message given?  YES (If response is "NO", the following Medicare IM given date fields will be blank) Date Medicare IM given:  10/27/2014 Medicare IM given by:  [facility] Date Additional Medicare IM given:  10/31/2014 Additional Medicare IM given by:  [facility]  Discharge Disposition:    Per UR Regulation:  Reviewed for med. necessity/level of care/duration of stay  If discussed at [place] of Stay Meetings, dates discussed:    Comments:  10/31/14 Dessa Phi RN BSN NCM [phone] d/c plan snf.  10/28/14 Dessa Phi RN BSN NCM [phone] Transfer from SDU.iv abx, 02.d/c plan snf. Csw following.

## 2014-10-28 NOTE — Progress Notes (Signed)
ANTIBIOTIC CONSULT NOTE - Follow-up  Pharmacy Consult for Levaquin Indication: COPD Exacerbation  No Known Allergies  Patient Measurements: Height: 5\' 3"  (160 cm) Weight: 251 lb 15.8 oz (114.3 kg) IBW/kg (Calculated) : 52.4  Vital Signs: Temp: 98.3 F (36.8 C) (01/05 0519) Temp Source: Oral (01/05 0519) BP: 118/54 mmHg (01/05 0519) Pulse Rate: 82 (01/05 0519) Intake/Output from previous day: 01/04 0701 - 01/05 0700 In: 900 [P.O.:600; IV Piggyback:300] Out: 1600 [Urine:1600] Intake/Output from this shift: Total I/O In: 60 [P.O.:60] Out: -   Labs:  Recent Labs  10/26/14 0418 10/26/14 0831 10/27/14 0439 10/28/14 0415  WBC 10.3  --  12.0*  --   HGB 11.9*  --  13.1  --   PLT 277  --  317  --   CREATININE 0.99 0.98 1.06 0.94   Estimated Creatinine Clearance: 58.2 mL/min (by C-G formula based on Cr of 0.94). No results for input(s): VANCOTROUGH, VANCOPEAK, VANCORANDOM, GENTTROUGH, GENTPEAK, GENTRANDOM, TOBRATROUGH, TOBRAPEAK, TOBRARND, AMIKACINPEAK, AMIKACINTROU, AMIKACIN in the last 72 hours.   Microbiology: Recent Results (from the past 720 hour(s))  MRSA PCR Screening     Status: Abnormal   Collection Time: 10/25/14 12:39 AM  Result Value Ref Range Status   MRSA by PCR POSITIVE (A) NEGATIVE Final    Comment:        The GeneXpert MRSA Assay (FDA approved for NASAL specimens only), is one component of a comprehensive MRSA colonization surveillance program. It is not intended to diagnose MRSA infection nor to guide or monitor treatment for MRSA infections. RESULT CALLED TO, READ BACK BY AND VERIFIED WITH: SPOKE WITH FREI,L RN [phone] COVINGTON,N     Medical History: Past Medical History  Diagnosis Date  . COPD (chronic obstructive pulmonary disease)   . Breast CA   . Hypertension   . HTN (hypertension) 01/18/2014  . Hypothyroidism 01/18/2014  . Medical history non-contributory   . Shortness of breath   . Pneumonia   . Diastolic CHF 03/24/6836     Assessment: 81 y/o F with COPD admitted 1/2 for HTN emergency and acute hypoxemic respiratory failure due to a COPD exacerbation and CHF exacerbation. Pharmacy is consulted to dose levofloxacin for AECOPD.   1/2 >> levofloxacin>>  Tmax: afebrile WBCs: elevated, increased, 12 (on prednisone) Renal: SCr 0.94, CrCl 58 ml/min CG  No cultures 1/2 MRSA PCR: POSITIVE 1/1 UA with few bacteria, otherwise grossly negative for UTI  Goal of Therapy:  Appropriate antibiotic dosing for renal function and indication Eradication of infection  Plan:   Continue Levaquin 750mg  IV q24h.  Switch to PO when clinically appropriate.  Follow renal function, cultures, clinical course.   Lindell Spar, PharmD, BCPS Pager: [phone] 10/28/2014 9:54 AM

## 2014-10-29 ENCOUNTER — Telehealth: Payer: Self-pay | Admitting: Pulmonary Disease

## 2014-10-29 LAB — CBC
HCT: 42.4 % (ref 36.0–46.0)
Hemoglobin: 12.6 g/dL (ref 12.0–15.0)
MCH: 27.7 pg (ref 26.0–34.0)
MCHC: 29.7 g/dL — ABNORMAL LOW (ref 30.0–36.0)
MCV: 93.2 fL (ref 78.0–100.0)
Platelets: 315 10*3/uL (ref 150–400)
RBC: 4.55 MIL/uL (ref 3.87–5.11)
RDW: 15.4 % (ref 11.5–15.5)
WBC: 10.4 10*3/uL (ref 4.0–10.5)

## 2014-10-29 LAB — BASIC METABOLIC PANEL
ANION GAP: 5 (ref 5–15)
BUN: 26 mg/dL — ABNORMAL HIGH (ref 6–23)
CALCIUM: 8.6 mg/dL (ref 8.4–10.5)
CHLORIDE: 94 meq/L — AB (ref 96–112)
CO2: 36 mmol/L — ABNORMAL HIGH (ref 19–32)
Creatinine, Ser: 1.08 mg/dL (ref 0.50–1.10)
GFR calc Af Amer: 55 mL/min — ABNORMAL LOW (ref >90)
GFR calc non Af Amer: 47 mL/min — ABNORMAL LOW (ref >90)
GLUCOSE: 99 mg/dL (ref 70–99)
Potassium: 3.8 mmol/L (ref 3.5–5.1)
SODIUM: 135 mmol/L (ref 135–145)

## 2014-10-29 LAB — GLUCOSE, CAPILLARY
GLUCOSE-CAPILLARY: 139 mg/dL — AB (ref 70–99)
GLUCOSE-CAPILLARY: 105 mg/dL — AB (ref 70–99)
Glucose-Capillary: 226 mg/dL — ABNORMAL HIGH (ref 70–99)
Glucose-Capillary: 140 mg/dL — ABNORMAL HIGH (ref 70–99)

## 2014-10-29 MED ORDER — ALBUTEROL SULFATE (2.5 MG/3ML) 0.083% IN NEBU
2.5000 mg | INHALATION_SOLUTION | RESPIRATORY_TRACT | Status: DC | PRN
  Administered 2014-10-31: 2.5 mg via RESPIRATORY_TRACT
  Filled 2014-10-29: qty 3

## 2014-10-29 MED ORDER — IPRATROPIUM-ALBUTEROL 0.5-2.5 (3) MG/3ML IN SOLN: 3.0000 mL | RESPIRATORY_TRACT | Status: DC | PRN

## 2014-10-29 MED ORDER — PREDNISONE 20 MG PO TABS
40.0000 mg | ORAL_TABLET | Freq: Every day | ORAL | Status: DC
  Administered 2014-10-30: 40 mg via ORAL
  Filled 2014-10-29: qty 2

## 2014-10-29 MED ORDER — METHYLPREDNISOLONE SODIUM SUCC 125 MG IJ SOLR: 60.0000 mg | Freq: Three times a day (TID) | INTRAMUSCULAR | Status: DC

## 2014-10-29 MED ORDER — IPRATROPIUM-ALBUTEROL 0.5-2.5 (3) MG/3ML IN SOLN: 3.0000 mL | RESPIRATORY_TRACT | Status: DC

## 2014-10-29 NOTE — Progress Notes (Signed)
Physical Therapy Treatment Patient Details Name: Cobi Aldape MRN: [id number] DOB: 08/27/1932 Today's Date: 28-Nov-2014    History of Present Illness This 81 y.o. female admitted with acute hypoxemic respiratory failure due to COPD exacerbation and CHF exacerbation.  PMH includes:  COPD; breast CA; HTN; PNA; Diastolic CHF;     PT Comments    Improvement in activity tolerance with pt maintaining SaO2 above 93% on 4L with activity.  Follow Up Recommendations  SNF     Equipment Recommendations  None recommended by PT    Recommendations for Other Services OT consult     Precautions / Restrictions Precautions Precautions: Fall Restrictions Weight Bearing Restrictions: No    Mobility  Bed Mobility Overal bed mobility: Needs Assistance Bed Mobility: Supine to Sit;Sit to Supine     Supine to sit: Min assist;HOB elevated Sit to supine: Mod assist   General bed mobility comments: assist for Bil LEs to return to bed  Transfers Overall transfer level: Needs assistance Equipment used: Rolling walker (2 wheeled) Transfers: Sit to/from Stand Sit to Stand: Min assist Stand pivot transfers: Min assist       General transfer comment: assist to rise and steady  Ambulation/Gait Ambulation/Gait assistance: Min assist Ambulation Distance (Feet): 40 Feet (twice) Assistive device: Rolling walker (2 wheeled) Gait Pattern/deviations: Step-through pattern;Decreased step length - right;Decreased step length - left;Shuffle;Trunk flexed Gait velocity: decr with multiple rests required 2* SOB   General Gait Details: cues for posture, pacing, position from RW and breathing technique   Stairs            Wheelchair Mobility    Modified Rankin (Stroke Patients Only)       Balance                                    Cognition Arousal/Alertness: Awake/alert Behavior During Therapy: Anxious Overall Cognitive Status: Within Functional Limits for tasks assessed                      Exercises      General Comments        Pertinent Vitals/Pain Pain Assessment: No/denies pain    Home Living                      Prior Function            PT Goals (current goals can now be found in the care plan section) Acute Rehab PT Goals Patient Stated Goal: to get stronger PT Goal Formulation: With patient Time For Goal Achievement: 11/10/14 Potential to Achieve Goals: Fair Progress towards PT goals: Progressing toward goals    Frequency  Min 3X/week    PT Plan Current plan remains appropriate    Co-evaluation PT/OT/SLP Co-Evaluation/Treatment: Yes Reason for Co-Treatment: For patient/therapist safety PT goals addressed during session: Mobility/safety with mobility OT goals addressed during session: ADL's and self-care     End of Session Equipment Utilized During Treatment: Gait belt;Oxygen Activity Tolerance: Patient limited by fatigue Patient left: in bed;with call bell/phone within reach     Time: 1444-1525 PT Time Calculation (min) (ACUTE ONLY): 41 min  Charges:  $Gait Training: 8-22 mins                    G Codes:      Fannye Myer 2014/11/28, 4:26 PM

## 2014-10-29 NOTE — Telephone Encounter (Signed)
Hi,  This lady needs a hospital follow up with either me or Tammy in 2 weeks after hospital discharge.  Thanks  [employer]

## 2014-10-29 NOTE — Progress Notes (Signed)
[facility] PROGRESS NOTE  Myrtle Haller FYB:[id number] DOB: 24-Feb-1932 DOA: 10/24/2014 PCP: Bartholome Bill, MD  Assessment/Plan: Acute respiratory failure with hypoxia This was most likely due to a combination of diastolic heart failure and COPD. However, appears to be mostly COPD exacerbation. She was given intravenous Lasix. Significantly improved. Off BiPAP. Continue oxygen by nasal cannula. Pulmonary following  Acute COPD exacerbation. Improving. Now on oral steroids with recs to wean over one week. Continue antibiotics and nebulizer treatments. Changed to oral antibiotics. Appreciate pulmonology input. Continue other inhalers as well. Some concern for upper airway issues as well.  Hyperglycemia No known history of diabetes. Possibly from acute stress and steroid. Continue sliding scale coverage. HbA1c is 6.5. CBGs are improved.  Possible acute diastolic CHF with mildly elevated troponin Last EF was 60-65% in March 2015. She was given intravenous Lasix. Now on oral Lasix. No need to repeat echocardiogram. Admission EKG was poor quality. Due to concerns for T wave changes EKG was repeated which was also not of great quality, but no T inversions noted on this EKG. Elevated troponin, most likely due to mild demand ischemia from respiratory failure rather than a de novo cardiac process. Continue aspirin. No further evaluation is necessary.  Accelerated hypertension. This was present in the emergency department. She was placed on nitroglycerin infusion, which was tapered off. Blood pressure is much better. She is on ACE inhibitor, which will be continued.   History of hypothyroidism Continue with home medications. TSH 1.7.  Code Status: Full Family Communication: Pt in room (indicate person spoken with, relationship, and if by phone, the number) Disposition Plan: Pending   Consultants:  Pulmonary  Procedures:    Antibiotics:  Levaquin   HPI/Subjective: No acute  events noted. Pt still reports wheezing  Objective: Filed Vitals:   10/28/14 2218 10/29/14 0646 10/29/14 1100 10/29/14 1500  BP: 135/60 154/58  120/64  Pulse: 77 65  71  Temp: 98.9 F (37.2 C) 98.3 F (36.8 C)  98.2 F (36.8 C)  TempSrc: Oral Oral  Oral  Resp: 18 18  20   Height:      Weight:  114.1 kg (251 lb 8.7 oz)    SpO2: 96% 96% 95% 95%    Intake/Output Summary (Last 24 hours) at 10/29/14 1947 Last data filed at 10/29/14 1700  Gross per 24 hour  Intake    480 ml  Output    150 ml  Net    330 ml   Filed Weights   10/27/14 0526 10/28/14 0519 10/29/14 0646  Weight: 114.8 kg (253 lb 1.4 oz) 114.3 kg (251 lb 15.8 oz) 114.1 kg (251 lb 8.7 oz)    Exam:   General:  Awake, in nad  Cardiovascular: regular, s1, s2  Respiratory: normal resp effort, end-expiratory wheezing  Abdomen: soft,nondistended  Musculoskeletal: perfused, no clubbing   Data Reviewed: Basic Metabolic Panel:  Recent Labs Lab 10/26/14 0418 10/26/14 0831 10/27/14 0439 10/28/14 0415 10/29/14 0400  NA 132* 134* 134* 135 135  K 3.9 4.3 3.3* 3.7 3.8  CL 91* 94* 91* 94* 94*  CO2 32 31 33* 34* 36*  GLUCOSE 173* 130* 125* 134* 99  BUN 31* 32* 35* 27* 26*  CREATININE 0.99 0.98 1.06 0.94 1.08  CALCIUM 8.5 8.6 8.6 8.6 8.6  MG  --   --   --  2.1  --    Liver Function Tests:  Recent Labs Lab 10/24/14 2107 10/25/14 0235  AST 42* 35  ALT 40* 36*  ALKPHOS 91 82  BILITOT 1.0 0.6  PROT 7.5 7.0  ALBUMIN 3.8 3.5   No results for input(s): LIPASE, AMYLASE in the last 168 hours. No results for input(s): AMMONIA in the last 168 hours. CBC:  Recent Labs Lab 10/24/14 2107 10/25/14 0235 10/26/14 0418 10/27/14 0439 10/29/14 0400  WBC 14.3* 10.2 10.3 12.0* 10.4  NEUTROABS 10.4* 9.7*  --   --   --   HGB 13.3 12.0 11.9* 13.1 12.6  HCT 41.6 37.3 38.9 42.4 42.4  MCV 89.7 89.2 90.5 92.0 93.2  PLT 270 215 277 317 315   Cardiac Enzymes:  Recent Labs Lab 10/25/14 0235 10/25/14 0739  10/25/14 1414 10/26/14 0418 10/27/14 0439  TROPONINI 0.10* 0.09* 0.06* 0.04* 0.04*   BNP (last 3 results)  Recent Labs  01/18/14 1532 01/19/14 0325 02/22/14 1130  PROBNP 484.2* 423.1 383.7   CBG:  Recent Labs Lab 10/28/14 1710 10/28/14 2213 10/29/14 0809 10/29/14 1135 10/29/14 1649  GLUCAP 146* 103* 226* 140* 139*    Recent Results (from the past 240 hour(s))  MRSA PCR Screening     Status: Abnormal   Collection Time: 10/25/14 12:39 AM  Result Value Ref Range Status   MRSA by PCR POSITIVE (A) NEGATIVE Final    Comment:        The GeneXpert MRSA Assay (FDA approved for NASAL specimens only), is one component of a comprehensive MRSA colonization surveillance program. It is not intended to diagnose MRSA infection nor to guide or monitor treatment for MRSA infections. RESULT CALLED TO, READ BACK BY AND VERIFIED WITH: SPOKE WITH FREI,L RN [phone] COVINGTON,N      Studies: Dg Chest Port 1 View  10/28/2014   CLINICAL DATA:  Pulmonary edema  EXAM: PORTABLE CHEST - 1 VIEW  COMPARISON:  10/24/2014  FINDINGS: Improvement in vascular congestion. Negative for edema or effusion. Heart size upper normal.  Left lower lobe airspace disease better seen on today's study and perhaps slightly increased from the prior study. Possible atelectasis or pneumonia. Follow-up recommended.  Left upper lobe nodule is unchanged from prior studies and is probably calcified.  IMPRESSION: Interval improvement in pulmonary vascular congestion.  Mild progression of left lower lobe atelectasis/infiltrate, possible pneumonia.   Electronically Signed   By: Franchot Gallo M.D.   On: 10/28/2014 07:14    Scheduled Meds: . antiseptic oral rinse  7 mL Mouth Rinse BID  . arformoterol  15 mcg Nebulization BID  . aspirin EC  325 mg Oral Daily  . budesonide (PULMICORT) nebulizer solution  0.5 mg Nebulization BID  . busPIRone  10 mg Oral TID  . Chlorhexidine Gluconate Cloth  6 each Topical Q0600  .  docusate sodium  100 mg Oral BID  . enoxaparin (LOVENOX) injection  40 mg Subcutaneous Q24H  . sodium chloride  2 spray Each Nare QID   And  . oxymetazoline  2 spray Each Nare BID   And  . fluticasone  2 spray Each Nare BID  . furosemide  40 mg Oral Daily  . insulin aspart  0-15 Units Subcutaneous TID WC  . insulin aspart  0-5 Units Subcutaneous QHS  . levofloxacin  750 mg Oral Daily  . levothyroxine  75 mcg Oral QAC breakfast  . lisinopril  10 mg Oral Daily  . loratadine  10 mg Oral Daily  . mupirocin ointment  1 application Nasal BID  . pantoprazole  40 mg Oral Q1200  . polyethylene glycol  17 g Oral Daily  .  potassium chloride  20 mEq Oral Daily  . [START ON 10/30/2014] predniSONE  40 mg Oral Q breakfast  . sodium chloride  3 mL Intravenous Q12H  . tiotropium  18 mcg Inhalation Daily   Continuous Infusions:   Principal Problem:   Acute respiratory failure Active Problems:   Hypothyroidism   COPD exacerbation   Acute on chronic diastolic heart failure   Uncontrolled hypertension   Acute respiratory failure with hypoxia    Time spent: 17min    Langdon Crosson, [place] [facility] Pager [phone]. If 7PM-7AM, please contact night-coverage at [email or web address], password [facility] 10/29/2014, 7:47 PM  LOS: 5 days

## 2014-10-29 NOTE — Telephone Encounter (Signed)
Patient is already scheduled to see BQ on 1.22.16 for HFU  Will sign off

## 2014-10-29 NOTE — Progress Notes (Signed)
Occupational Therapy Treatment Patient Details Name: Dawn Montoya MRN: [id number] DOB: [birth year] Today's Date: 10/29/2014    History of present illness This 81 y.o. female admitted with acute hypoxemic respiratory failure due to COPD exacerbation and CHF exacerbation.  PMH includes:  COPD; breast CA; HTN; PNA; Diastolic CHF;    OT comments  Pt is making progress in OT.  Periods of anxiety and pt did become lightheaded.  Need A x 2 for ambulation for safety.  Pt may benefit from reacher/toilet aide for ADLs  Follow Up Recommendations  SNF    Equipment Recommendations  3 in 1 bedside comode    Recommendations for Other Services      Precautions / Restrictions Precautions Precautions: Fall       Mobility Bed Mobility         Supine to sit: Min assist;HOB elevated     General bed mobility comments: assist for trunk and use of rail  Transfers       Sit to Stand: Min assist Stand pivot transfers: Min assist       General transfer comment: assist to rise and steady    Balance                                   ADL                       Lower Body Dressing: Total assistance;Sit to/from stand   Toilet Transfer: Minimal assistance;BSC;Stand-pivot   Toileting- Water quality scientist and Hygiene: Total assistance;Sit to/from stand         General ADL Comments: pt had stress incontinence and needed to get cleaned up.  total A to don mesh underwear with pad and for toilet hygiene.  A x 1 for SPT to [facility] but +2 assistance for ambulating.  Pt c/o dizziness after walking:  BP 130/82.  She was very anxious when I went to remove RW so that I could assist her in doffing underwear.  Cueing for pursed lip breathing:  sats remained in 90s on 4 liters of 02      Vision                     Perception     Praxis      Cognition   Behavior During Therapy: Anxious Overall Cognitive Status: Within Functional Limits for tasks assessed                        Extremity/Trunk Assessment               Exercises     Shoulder Instructions       General Comments      Pertinent Vitals/ Pain       Pain Assessment: No/denies pain  Home Living                                          Prior Functioning/Environment              Frequency Min 2X/week     Progress Toward Goals  OT Goals(current goals can now be found in the care plan section)  Progress towards OT goals: Progressing toward goals     Plan      Co-evaluation  Reason for Co-Treatment: For patient/therapist safety PT goals addressed during session: Mobility/safety with mobility OT goals addressed during session: ADL's and self-care      End of Session     Activity Tolerance Patient tolerated treatment well   Patient Left in bed;with call bell/phone within reach;with bed alarm set   Nurse Communication          Time: 1224-4975 OT Time Calculation (min): 41 min  Charges: OT General Charges $OT Visit: 1 Procedure OT Treatments $Self Care/Home Management : 8-22 mins $Therapeutic Activity: 8-22 mins  Halen Antenucci 10/29/2014, 3:28 PM   Lesle Chris, OTR/L [phone] 10/29/2014

## 2014-10-29 NOTE — Progress Notes (Signed)
PULMONARY / CRITICAL CARE MEDICINE   Name: Dawn Montoya MRN: [id number] DOB: 12-07-1931    ADMISSION DATE:  10/24/2014 CONSULTATION DATE:  1/2  REFERRING MD :  Rachel Moulds  CHIEF COMPLAINT:  Shortness of breath  INITIAL PRESENTATION: 81 yo female with COPD and CHF admitted on 1/1 with acute hypoxemic respiratory failure due to a COPD exacerbation and CHF exacerbation.  She required BIPAP overnight on the first day of admission.  STUDIES:    SIGNIFICANT EVENTS:   SUBJECTIVE:  Looks better. Cough and wheeze improved.   VITAL SIGNS: Temp:  [98.3 F (36.8 C)-99 F (37.2 C)] 98.3 F (36.8 C) (01/06 0646) Pulse Rate:  [65-77] 65 (01/06 0646) Resp:  [18] 18 (01/06 0646) BP: (126-154)/(58-62) 154/58 mmHg (01/06 0646) SpO2:  [94 %-96 %] 96 % (01/06 0646) Weight:  [114.1 kg (251 lb 8.7 oz)] 114.1 kg (251 lb 8.7 oz) (01/06 0646) 4 liters    INTAKE / OUTPUT:  Intake/Output Summary (Last 24 hours) at 10/29/14 0929 Last data filed at 10/28/14 2015  Gross per 24 hour  Intake    360 ml  Output    450 ml  Net    -90 ml    PHYSICAL EXAMINATION: General:  No acute distress, multiple complaints, but more related to medications and situation-->her breathing is much better Neuro:  A&Ox4, MAEW HEENT:  NCAT, EOMi, + sinus congestion. No thrush -->congestion improved-->pseudowheeze only exhibited when she exerts self.  Cardiovascular:  RRR, no mgr Lungs:  Exertional dyspnea improved, breath sounds clear  Abdomen:  BS+, soft, nontender Musculoskeletal:  Warm, normal bulk and tone Skin:  No rash or skin breakdown Ext: chronic appearing edema in legs bilaterally  LABS:  CBC  Recent Labs Lab 10/26/14 0418 10/27/14 0439 10/29/14 0400  WBC 10.3 12.0* 10.4  HGB 11.9* 13.1 12.6  HCT 38.9 42.4 42.4  PLT 277 317 315   Coag's No results for input(s): APTT, INR in the last 168 hours. BMET  Recent Labs Lab 10/27/14 0439 10/28/14 0415 10/29/14 0400  NA 134* 135 135  K 3.3* 3.7  3.8  CL 91* 94* 94*  CO2 33* 34* 36*  BUN 35* 27* 26*  CREATININE 1.06 0.94 1.08  GLUCOSE 125* 134* 99   Electrolytes  Recent Labs Lab 10/27/14 0439 10/28/14 0415 10/29/14 0400  CALCIUM 8.6 8.6 8.6  MG  --  2.1  --    Sepsis Markers No results for input(s): LATICACIDVEN, PROCALCITON, O2SATVEN in the last 168 hours. ABG  Recent Labs Lab 10/24/14 2205 10/25/14 0420  PHART 7.260* 7.401  PCO2ART 65.7* 49.6*  PO2ART 492.0* 76.1*   Liver Enzymes  Recent Labs Lab 10/24/14 2107 10/25/14 0235  AST 42* 35  ALT 40* 36*  ALKPHOS 91 82  BILITOT 1.0 0.6  ALBUMIN 3.8 3.5   Cardiac Enzymes  Recent Labs Lab 10/25/14 1414 10/26/14 0418 10/27/14 0439  TROPONINI 0.06* 0.04* 0.04*   Glucose  Recent Labs Lab 10/27/14 2115 10/28/14 0714 10/28/14 1138 10/28/14 1710 10/28/14 2213 10/29/14 0809  GLUCAP 139* 128* 159* 146* 103* 226*    Imaging Dg Chest Port 1 View  10/28/2014   CLINICAL DATA:  Pulmonary edema  EXAM: PORTABLE CHEST - 1 VIEW  COMPARISON:  10/24/2014  FINDINGS: Improvement in vascular congestion. Negative for edema or effusion. Heart size upper normal.  Left lower lobe airspace disease better seen on today's study and perhaps slightly increased from the prior study. Possible atelectasis or pneumonia. Follow-up recommended.  Left upper lobe nodule  is unchanged from prior studies and is probably calcified.  IMPRESSION: Interval improvement in pulmonary vascular congestion.  Mild progression of left lower lobe atelectasis/infiltrate, possible pneumonia.   Electronically Signed   By: Franchot Gallo M.D.   On: 10/28/2014 07:14   LLL ATX. Improved aeration. LUL nodule unchanged  ASSESSMENT / PLAN:   Acute on chronic respiratory failure with hypoxemia due to COPD and CHF exacerbation COPD on 2L O2 Probable sinusitis w/ significant PND and persistent cough. This seems to be adding some upper airway component to her symptom burden.  LUL nodule.  Granddaughter notes  non-compliance with therapy at home, specifically Spiriva P:   Wean prednisone over 7-10d Continue pulmicort 0.5mg  q12h, brovana (avoid any powdered inhalers) Change SABA to PRN Change Spiriva to Spiriva Respimat at discharge.  Added flutter  Added nasal saline/afrin and steroid.   (stop the afrin after 4 d) Added antihistamine Complete 7d levaquin  Consider CT chest at some point to eval LUL nodule. Although at 81 years old w/ minimal activity tolerance likely little to do about this Have set her up w/ Dr [place] Bells Jan 22 at 245pm   Acute decompensated diastolic heart failure. Now compensated as of 1/6 P:  Maintain current afterload regimen including ACE-Inhibitor Would use home dose of lasix   Anxiety contributing to sensation of dyspnea P: Increased Buspar-->monitor for clinical effect   We will s/o. Appointment for f/u arranged  Agree w/ consideration for SNF for rehab.   Erick Colace ACNP-BC [place] Pager # [phone] OR # [phone] if no answer   Attending Note:  I have examined patient, reviewed labs, studies and notes. I have discussed the case with Jerrye Bushy and I agree with the data and plans as amended above.   Baltazar Apo, MD, PhD 10/29/2014, 1:06 PM [facility] [phone] or if no answer [phone]

## 2014-10-30 LAB — CBC
HCT: 42 % (ref 36.0–46.0)
Hemoglobin: 12.7 g/dL (ref 12.0–15.0)
MCH: 28.2 pg (ref 26.0–34.0)
MCHC: 30.2 g/dL (ref 30.0–36.0)
MCV: 93.3 fL (ref 78.0–100.0)
PLATELETS: 298 10*3/uL (ref 150–400)
RBC: 4.5 MIL/uL (ref 3.87–5.11)
RDW: 15.4 % (ref 11.5–15.5)
WBC: 11 10*3/uL — AB (ref 4.0–10.5)

## 2014-10-30 LAB — BASIC METABOLIC PANEL
ANION GAP: 7 (ref 5–15)
BUN: 31 mg/dL — AB (ref 6–23)
CALCIUM: 8.9 mg/dL (ref 8.4–10.5)
CHLORIDE: 96 meq/L (ref 96–112)
CO2: 38 mmol/L — ABNORMAL HIGH (ref 19–32)
CREATININE: 1.15 mg/dL — AB (ref 0.50–1.10)
GFR, EST AFRICAN AMERICAN: 51 mL/min — AB (ref >90)
GFR, EST NON AFRICAN AMERICAN: 44 mL/min — AB (ref >90)
Glucose, Bld: 115 mg/dL — ABNORMAL HIGH (ref 70–99)
Potassium: 3.6 mmol/L (ref 3.5–5.1)
Sodium: 141 mmol/L (ref 135–145)

## 2014-10-30 LAB — GLUCOSE, CAPILLARY
GLUCOSE-CAPILLARY: 139 mg/dL — AB (ref 70–99)
Glucose-Capillary: 101 mg/dL — ABNORMAL HIGH (ref 70–99)
Glucose-Capillary: 220 mg/dL — ABNORMAL HIGH (ref 70–99)
Glucose-Capillary: 173 mg/dL — ABNORMAL HIGH (ref 70–99)

## 2014-10-30 MED ORDER — METHYLPREDNISOLONE SODIUM SUCC 125 MG IJ SOLR
60.0000 mg | Freq: Two times a day (BID) | INTRAMUSCULAR | Status: DC
  Administered 2014-10-30 – 2014-10-31 (×2): 60 mg via INTRAVENOUS
  Filled 2014-10-30 (×3): qty 0.96

## 2014-10-30 MED ORDER — METHYLPREDNISOLONE SODIUM SUCC 125 MG IJ SOLR
60.0000 mg | Freq: Four times a day (QID) | INTRAMUSCULAR | Status: DC
  Administered 2014-10-30: 60 mg via INTRAVENOUS
  Filled 2014-10-30 (×2): qty 0.96

## 2014-10-30 MED ORDER — METHYLPREDNISOLONE SODIUM SUCC 125 MG IJ SOLR: 60.0000 mg | Freq: Two times a day (BID) | INTRAMUSCULAR | Status: DC

## 2014-10-30 NOTE — Progress Notes (Signed)
[facility] PROGRESS NOTE  Rehema Muffley DGU:[id number] DOB: Apr 26, 1932 DOA: 10/24/2014 PCP: Bartholome Bill, MD  Assessment/Plan: Acute respiratory failure with hypoxia This was most likely due to a combination of diastolic heart failure and COPD. However, appears to be mostly COPD exacerbation. She was given intravenous Lasix. Significantly improved. Off BiPAP. Continue oxygen by nasal cannula. Pulmonary following  Acute COPD exacerbation. Improving. Was on oral steroids with Pulmonary recs to wean over one week. Pt remains of 4L [place] with complaints of sob. Will transition briefly back to IV steroids and wean O2. Continue antibiotics and nebulizer treatments. Appreciate pulmonology input. Some concern for upper airway issues as well.  Hyperglycemia No known history of diabetes. Possibly from acute stress and steroid. Continue sliding scale coverage. HbA1c is 6.5. CBGs are improved.  Possible acute diastolic CHF with mildly elevated troponin Last EF was 60-65% in March 2015. She was given intravenous Lasix. Now on oral Lasix. No need to repeat echocardiogram. Admission EKG was poor quality. Due to concerns for T wave changes EKG was repeated which was also not of great quality, but no T inversions noted on this EKG. Elevated troponin, most likely due to mild demand ischemia from respiratory failure rather than a de novo cardiac process. Continue aspirin. No further evaluation is necessary.  Accelerated hypertension. This was present in the emergency department. She was placed on nitroglycerin infusion, which was tapered off. Blood pressure is much better. She is on ACE inhibitor, which will be continued.   History of hypothyroidism Continue with home medications. TSH 1.7.  Code Status: Full Family Communication: Pt in room  Disposition Plan: Pending   Consultants:  Pulmonary  Procedures:    Antibiotics:  Levaquin   HPI/Subjective: Pt feels better. Still sob on  exertion  Objective: Filed Vitals:   10/29/14 2026 10/29/14 2159 10/30/14 0631 10/30/14 0947  BP:  124/57 120/57   Pulse:  65 69   Temp:  99.4 F (37.4 C) 98.6 F (37 C)   TempSrc:  Oral Oral   Resp:  18 18   Height:      Weight:   114.1 kg (251 lb 8.7 oz)   SpO2: 94% 96% 96% 98%    Intake/Output Summary (Last 24 hours) at 10/30/14 1522 Last data filed at 10/30/14 1300  Gross per 24 hour  Intake    600 ml  Output      0 ml  Net    600 ml   Filed Weights   10/28/14 0519 10/29/14 0646 10/30/14 0631  Weight: 114.3 kg (251 lb 15.8 oz) 114.1 kg (251 lb 8.7 oz) 114.1 kg (251 lb 8.7 oz)    Exam:   General:  Awake, in nad  Cardiovascular: regular, s1, s2  Respiratory: normal resp effort, wheezing improved  Abdomen: soft,nondistended  Musculoskeletal: perfused, no clubbing   Data Reviewed: Basic Metabolic Panel:  Recent Labs Lab 10/26/14 0831 10/27/14 0439 10/28/14 0415 10/29/14 0400 10/30/14 0432  NA 134* 134* 135 135 141  K 4.3 3.3* 3.7 3.8 3.6  CL 94* 91* 94* 94* 96  CO2 31 33* 34* 36* 38*  GLUCOSE 130* 125* 134* 99 115*  BUN 32* 35* 27* 26* 31*  CREATININE 0.98 1.06 0.94 1.08 1.15*  CALCIUM 8.6 8.6 8.6 8.6 8.9  MG  --   --  2.1  --   --    Liver Function Tests:  Recent Labs Lab 10/24/14 2107 10/25/14 0235  AST 42* 35  ALT 40* 36*  ALKPHOS  91 82  BILITOT 1.0 0.6  PROT 7.5 7.0  ALBUMIN 3.8 3.5   No results for input(s): LIPASE, AMYLASE in the last 168 hours. No results for input(s): AMMONIA in the last 168 hours. CBC:  Recent Labs Lab 10/24/14 2107 10/25/14 0235 10/26/14 0418 10/27/14 0439 10/29/14 0400 10/30/14 0432  WBC 14.3* 10.2 10.3 12.0* 10.4 11.0*  NEUTROABS 10.4* 9.7*  --   --   --   --   HGB 13.3 12.0 11.9* 13.1 12.6 12.7  HCT 41.6 37.3 38.9 42.4 42.4 42.0  MCV 89.7 89.2 90.5 92.0 93.2 93.3  PLT 270 215 277 317 315 298   Cardiac Enzymes:  Recent Labs Lab 10/25/14 0235 10/25/14 0739 10/25/14 1414 10/26/14 0418  10/27/14 0439  TROPONINI 0.10* 0.09* 0.06* 0.04* 0.04*   BNP (last 3 results)  Recent Labs  01/18/14 1532 01/19/14 0325 02/22/14 1130  PROBNP 484.2* 423.1 383.7   CBG:  Recent Labs Lab 10/29/14 1135 10/29/14 1649 10/29/14 2156 10/30/14 0724 10/30/14 1141  GLUCAP 140* 139* 105* 101* 220*    Recent Results (from the past 240 hour(s))  MRSA PCR Screening     Status: Abnormal   Collection Time: 10/25/14 12:39 AM  Result Value Ref Range Status   MRSA by PCR POSITIVE (A) NEGATIVE Final    Comment:        The GeneXpert MRSA Assay (FDA approved for NASAL specimens only), is one component of a comprehensive MRSA colonization surveillance program. It is not intended to diagnose MRSA infection nor to guide or monitor treatment for MRSA infections. RESULT CALLED TO, READ BACK BY AND VERIFIED WITH: SPOKE WITH FREI,L RN [phone] COVINGTON,N      Studies: No results found.  Scheduled Meds: . antiseptic oral rinse  7 mL Mouth Rinse BID  . arformoterol  15 mcg Nebulization BID  . aspirin EC  325 mg Oral Daily  . budesonide (PULMICORT) nebulizer solution  0.5 mg Nebulization BID  . busPIRone  10 mg Oral TID  . docusate sodium  100 mg Oral BID  . enoxaparin (LOVENOX) injection  40 mg Subcutaneous Q24H  . sodium chloride  2 spray Each Nare QID   And  . oxymetazoline  2 spray Each Nare BID   And  . fluticasone  2 spray Each Nare BID  . furosemide  40 mg Oral Daily  . insulin aspart  0-15 Units Subcutaneous TID WC  . insulin aspart  0-5 Units Subcutaneous QHS  . levofloxacin  750 mg Oral Daily  . levothyroxine  75 mcg Oral QAC breakfast  . lisinopril  10 mg Oral Daily  . loratadine  10 mg Oral Daily  . methylPREDNISolone (SOLU-MEDROL) injection  60 mg Intravenous Q12H  . mupirocin ointment  1 application Nasal BID  . pantoprazole  40 mg Oral Q1200  . polyethylene glycol  17 g Oral Daily  . potassium chloride  20 mEq Oral Daily  . sodium chloride  3 mL Intravenous  Q12H  . tiotropium  18 mcg Inhalation Daily   Continuous Infusions:   Principal Problem:   Acute respiratory failure Active Problems:   Hypothyroidism   COPD exacerbation   Acute on chronic diastolic heart failure   Uncontrolled hypertension   Acute respiratory failure with hypoxia    Time spent: 7min    Rochell Mabie, [facility] Pager [phone]. If 7PM-7AM, please contact night-coverage at [email or web address], password [facility] 10/30/2014, 3:22 PM  LOS: 6 days

## 2014-10-30 NOTE — Progress Notes (Signed)
Physical Therapy Treatment Patient Details Name: Dawn Montoya MRN: [id number] DOB: 03/01/1932 Today's Date: 10/30/2014    History of Present Illness This 81 y.o. female admitted with acute hypoxemic respiratory failure due to COPD exacerbation and CHF exacerbation.  PMH includes:  COPD; breast CA; HTN; PNA; Diastolic CHF;     PT Comments    Slow but steady progress with endurance/activity tolerance.  Pt continues to require increased O2(4L) to maintain SaO2 with activity.  Follow Up Recommendations  SNF     Equipment Recommendations  None recommended by PT    Recommendations for Other Services OT consult     Precautions / Restrictions Precautions Precautions: Fall Restrictions Weight Bearing Restrictions: No    Mobility  Bed Mobility Overal bed mobility: Needs Assistance Bed Mobility: Supine to Sit;Sit to Supine     Supine to sit: Min assist;HOB elevated Sit to supine: Mod assist   General bed mobility comments: light min A to bring trunk forward; HOB raised and pt used rail:  assist wtih Bil LEs to get into bed  Transfers Overall transfer level: Needs assistance Equipment used: Rolling walker (2 wheeled) Transfers: Sit to/from Stand Sit to Stand: Min assist (to from bed, to/from chair and to/from [facility] 3x) Stand pivot transfers: Min assist       General transfer comment: assist to rise and steady  Ambulation/Gait Ambulation/Gait assistance: Min assist Ambulation Distance (Feet): 40 Feet (twice) Assistive device: Rolling walker (2 wheeled) Gait Pattern/deviations: Step-through pattern;Decreased step length - right;Decreased step length - left;Shuffle;Trunk flexed Gait velocity: decreased but with min pauses for SOB Gait velocity interpretation: Below normal speed for age/gender General Gait Details: cues for posture, pacing, position from RW and breathing technique   Stairs            Wheelchair Mobility    Modified Rankin (Stroke Patients Only)        Balance                                    Cognition Arousal/Alertness: Awake/alert Behavior During Therapy: WFL for tasks assessed/performed Overall Cognitive Status: Within Functional Limits for tasks assessed                      Exercises      General Comments General comments (skin integrity, edema, etc.): Pt initially on 2 1/2 liters 02 and sats 93%.  When standing/ambulating, on 3 liters, sats dropped to 88%.  Boosted to 4 liters when walking and sats dropped to 83%.  Temporarily raised sats to 6 liters on portable canister until sats raised then decreased to 4 liters when walking.   When repositioned in bed, pt decreased back to 2 1/2 liters.  Pt demonstrated pursed lip breathing throughout session with cues:  she tends to breathe quickly.  Cued for slow exhiliation        Pertinent Vitals/Pain Pain Assessment: No/denies pain    Home Living                      Prior Function            PT Goals (current goals can now be found in the care plan section) Acute Rehab PT Goals Patient Stated Goal: to get stronger PT Goal Formulation: With patient Time For Goal Achievement: 11/10/14 Potential to Achieve Goals: Fair Progress towards PT goals: Progressing toward goals    Frequency  Min 3X/week    PT Plan Current plan remains appropriate    Co-evaluation   Reason for Co-Treatment: For patient/therapist safety PT goals addressed during session: Mobility/safety with mobility OT goals addressed during session: ADL's and self-care     End of Session Equipment Utilized During Treatment: Gait belt;Oxygen Activity Tolerance: Patient limited by fatigue Patient left: in bed;with call bell/phone within reach     Time: 9539-6728 PT Time Calculation (min) (ACUTE ONLY): 62 min  Charges:  $Gait Training: 8-22 mins $Therapeutic Activity: 8-22 mins                    G Codes:      Dawn Montoya 11-17-14, 4:56 PM

## 2014-10-30 NOTE — Patient Care Conference (Signed)
Attempted to call pt's grand-daughter, [name] at cell number for update. No answer. Next called home number unfortunately without success either. Will try again later. Until then, pt is up to date on plan of care.

## 2014-10-30 NOTE — Progress Notes (Signed)
Occupational Therapy Treatment Patient Details Name: Dawn Montoya MRN: [id number] DOB: 1932-03-03 Today's Date: 10/30/2014    History of present illness This 81 y.o. female admitted with acute hypoxemic respiratory failure due to COPD exacerbation and CHF exacerbation.  PMH includes:  COPD; breast CA; HTN; PNA; Diastolic CHF;    OT comments  Pt needs lots of rest breaks and cues for pursed lip breathing; she is very motivated with therapy.  See ADL section for details about sats and 02.  Follow Up Recommendations  SNF    Equipment Recommendations  3 in 1 bedside comode    Recommendations for Other Services      Precautions / Restrictions Precautions Precautions: Fall Restrictions Weight Bearing Restrictions: No       Mobility Bed Mobility         Supine to sit: Min assist;HOB elevated     General bed mobility comments: light min A to bring trunk forward; HOB raised and pt used rail  Transfers   Equipment used: Rolling walker (2 wheeled) Transfers: Sit to/from Stand Sit to Stand: Min assist Stand pivot transfers: Min assist       General transfer comment: assist to rise and steady    Balance                                   ADL       Grooming: Wash/dry hands;Min guard;Standing                   Toilet Transfer: Minimal assistance;BSC;Stand-pivot   Toileting- Clothing Manipulation and Hygiene: Total assistance;Sit to/from stand       Functional mobility during ADLs: Minimal assistance;+2 for safety/equipment;Rolling walker General ADL Comments: pt needed to be cleaned due to urinary incontinence.  Had BM while sitting on commode. Assisted with hygiene (total A) and clothing management (max A).  After walking, pt had episode of bowel urgency/incontinence.  Assisted to clean up again.  Pt initially on 2 1/2 liters 02 and sats 93%.  When standing/ambulating, on 3 liters, sats dropped to 88%.  Boosted to 4 liters when walking and sats  dropped to 83%.  Temporarily raised sats to 6 liters on portable canister until sats raised then decreased to 4 liters when walking.   When repositioned in bed, pt decreased back to 2 1/2 liters.  Pt demonstrated pursed lip breathing throughout session with cues:  she tends to breathe quickly.  Cued for slow exhiliation  Educated on reacher, but pt is not able to don hospital's mesh underwear with this.      Vision                     Perception     Praxis      Cognition   Behavior During Therapy: WFL for tasks assessed/performed Overall Cognitive Status: Within Functional Limits for tasks assessed                       Extremity/Trunk Assessment               Exercises     Shoulder Instructions       General Comments      Pertinent Vitals/ Pain       Pain Assessment: No/denies pain  Home Living  Prior Functioning/Environment              Frequency       Progress Toward Goals  OT Goals(current goals can now be found in the care plan section)  Progress towards OT goals: Progressing toward goals     Plan      Co-evaluation      Reason for Co-Treatment: For patient/therapist safety PT goals addressed during session: Mobility/safety with mobility OT goals addressed during session: ADL's and self-care      End of Session     Activity Tolerance Patient tolerated treatment well (with a lot of rest breaks)   Patient Left in bed;with call bell/phone within reach   Nurse Communication          Time: 0258-5277 OT Time Calculation (min): 62 min  Charges: OT General Charges $OT Visit: 1 Procedure OT Treatments $Self Care/Home Management : 23-37 mins  Elmo Rio 10/30/2014, 4:44 PM  Lesle Chris, OTR/L [phone] 10/30/2014

## 2014-10-30 NOTE — Progress Notes (Signed)
CSW confirmed with patient's granddaughter, Dawn Montoya (c#: [phone]) that they chose [facility], confirmed with Suanne Marker at [facility] that they would be able to take patient when ready.   Clinical Social Work Department CLINICAL SOCIAL WORK PLACEMENT NOTE 10/30/2014  Patient:  Dawn Montoya, Dawn Montoya  Account Number:  [id number] Admit date:  10/24/2014  Clinical Social Worker:  Renold Genta  Date/time:  10/27/2014 04:07 PM  Clinical Social Work is seeking post-discharge placement for this patient at the following level of care:   SKILLED NURSING   (*CSW will update this form in Epic as items are completed)   10/27/2014  Patient/family provided with [place] Department of Clinical Social Work's list of facilities offering this level of care within the geographic area requested by the patient (or if unable, by the patient's family).  10/27/2014  Patient/family informed of their freedom to choose among providers that offer the needed level of care, that participate in Medicare, Medicaid or managed care program needed by the patient, have an available bed and are willing to accept the patient.  10/27/2014  Patient/family informed of [facility]' ownership interest in [facility], as well as of the fact that they are under no obligation to receive care at this facility.  PASARR submitted to EDS on 10/27/2014 PASARR number received on 10/27/2014  FL2 transmitted to all facilities in geographic area requested by pt/family on  10/27/2014 FL2 transmitted to all facilities within larger geographic area on   Patient informed that his/her managed care company has contracts with or will negotiate with  certain facilities, including the following:     Patient/family informed of bed offers received:  10/27/2014 Patient chooses bed at [place] Physician recommends and patient chooses bed at    Patient to be transferred to [place] on   Patient  to be transferred to facility by  Patient and family notified of transfer on  Name of family member notified:    The following physician request were entered in Epic:   Additional Comments:    Raynaldo Opitz, [place] Social Worker cell #: [phone]

## 2014-10-30 NOTE — Progress Notes (Signed)
Pt instructed on use of Flutter device.  Pt demonstrated with good technique and effort.  RT to monitor and assess as needed.

## 2014-10-31 LAB — GLUCOSE, CAPILLARY
GLUCOSE-CAPILLARY: 195 mg/dL — AB (ref 70–99)
Glucose-Capillary: 142 mg/dL — ABNORMAL HIGH (ref 70–99)
Glucose-Capillary: 141 mg/dL — ABNORMAL HIGH (ref 70–99)

## 2014-10-31 MED ORDER — BENZONATATE 100 MG PO CAPS: 100.0000 mg | ORAL_CAPSULE | Freq: Three times a day (TID) | ORAL | Status: DC | PRN

## 2014-10-31 MED ORDER — PANTOPRAZOLE SODIUM 40 MG PO TBEC: 40.0000 mg | DELAYED_RELEASE_TABLET | Freq: Every day | ORAL | Status: DC

## 2014-10-31 MED ORDER — LISINOPRIL 10 MG PO TABS: 10.0000 mg | ORAL_TABLET | Freq: Every day | ORAL | Status: DC

## 2014-10-31 MED ORDER — OXYCODONE HCL 5 MG PO TABS: 5.0000 mg | ORAL_TABLET | ORAL | Status: DC | PRN

## 2014-10-31 MED ORDER — FUROSEMIDE 40 MG PO TABS: 40.0000 mg | ORAL_TABLET | Freq: Every day | ORAL | Status: DC

## 2014-10-31 NOTE — Progress Notes (Signed)
Patient is set to discharge to [facility] today. Patient & grandaughter, [name] aware. Discharge packet given to RN, Ify. [facility] called for transport.   Clinical Social Work Department CLINICAL SOCIAL WORK PLACEMENT NOTE 10/31/2014  Patient:  JISELA, MERLINO  Account Number:  [id number] Admit date:  10/24/2014  Clinical Social Worker:  Renold Genta  Date/time:  10/27/2014 04:07 PM  Clinical Social Work is seeking post-discharge placement for this patient at the following level of care:   SKILLED NURSING   (*CSW will update this form in Epic as items are completed)   10/27/2014  Patient/family provided with [place] Department of Clinical Social Work's list of facilities offering this level of care within the geographic area requested by the patient (or if unable, by the patient's family).  10/27/2014  Patient/family informed of their freedom to choose among providers that offer the needed level of care, that participate in Medicare, Medicaid or managed care program needed by the patient, have an available bed and are willing to accept the patient.  10/27/2014  Patient/family informed of [facility]' ownership interest in [facility], as well as of the fact that they are under no obligation to receive care at this facility.  PASARR submitted to EDS on 10/27/2014 PASARR number received on 10/27/2014  FL2 transmitted to all facilities in geographic area requested by pt/family on  10/27/2014 FL2 transmitted to all facilities within larger geographic area on   Patient informed that his/her managed care company has contracts with or will negotiate with  certain facilities, including the following:     Patient/family informed of bed offers received:  10/27/2014 Patient chooses bed at [place] Physician recommends and patient chooses bed at    Patient to be transferred to [place] on  10/31/2014 Patient to be transferred  to facility by [facility] Patient and family notified of transfer on 10/31/2014 Name of family member notified:  patient's grandaughter, [name] via phone  The following physician request were entered in Epic:   Additional Comments:    Raynaldo Opitz, [place] Worker cell #: [phone]

## 2014-10-31 NOTE — Discharge Summary (Addendum)
Physician Discharge Summary  Dawn Montoya PYK:[id number] DOB: Feb 23, 1932 DOA: 10/24/2014  PCP: Bartholome Bill, MD  Admit date: 10/24/2014 Discharge date: 10/31/2014  Time spent: 30 minutes  Recommendations for Outpatient Follow-up:  Follow up with PCP in 1-2 weeks Please check weights daily and notify facility MD if wt increases 5lbs in 7 days  Discharge Diagnoses:  Principal Problem:   Acute respiratory failure Active Problems:   Hypothyroidism   COPD exacerbation   Acute on chronic diastolic heart failure   Uncontrolled hypertension   Acute respiratory failure with hypoxia  Discharge Condition: Improved  Diet recommendation: Heart Healthy  Filed Weights   10/29/14 0646 10/30/14 0631 10/31/14 0532  Weight: 114.1 kg (251 lb 8.7 oz) 114.1 kg (251 lb 8.7 oz) 114.7 kg (252 lb 13.9 oz)    History of present illness:  Please see admit h and p from 10/25/14 for details. Briefly, pt presents with marked sob in the setting of acute copd and diastolic chf exacerbation.  Hospital Course:  Acute respiratory failure with hypoxia This was most likely due to a combination of diastolic heart failure and COPD. However, appears to be mostly COPD exacerbation. She was continued on Lasix during this hospital course. Significantly improved. Off BiPAP. O2 weaned to baseline 2L [place]. Pulmonary had been following.  Acute COPD exacerbation. Improving. Was on oral steroids with Pulmonary recs to wean over one week. O2 was eventually weaned to baseline 2L [place]. Continue antibiotics and nebulizer treatments. Appreciated pulmonology input. Some concern for upper airway issues as well.  Hyperglycemia No known history of diabetes. Possibly from acute stress and steroid. Continue sliding scale coverage. HbA1c is 6.5. CBGs are improved.  Possible acute diastolic CHF with mildly elevated troponin Last EF was 60-65% in March 2015. She was given intravenous Lasix. Now on oral Lasix. No need to repeat  echocardiogram. Admission EKG was poor quality. Due to concerns for T wave changes EKG was repeated which was also not of great quality, but no T inversions noted on this EKG. Elevated troponin, most likely due to mild demand ischemia from respiratory failure rather than a de novo cardiac process. Continue aspirin. No further evaluation is necessary.  Accelerated hypertension. This was present in the emergency department. She was placed on nitroglycerin infusion, which was tapered off. Blood pressure is much better. She is on ACE inhibitor, which will be continued.   History of hypothyroidism Continue with home medications. TSH 1.7  Consultations:  Pulmonary  Discharge Exam: Filed Vitals:   10/31/14 0532 10/31/14 0741 10/31/14 0944 10/31/14 1348  BP: 136/75  122/56 137/51  Pulse: 80 68  79  Temp: 98.6 F (37 C)   98.1 F (36.7 C)  TempSrc: Oral   Oral  Resp: 20 18  18   Height:      Weight: 114.7 kg (252 lb 13.9 oz)     SpO2: 90% 90%  90%    General: Awake, in nad Cardiovascular: regular, s1, s2 Respiratory: normal resp effort, no wheezing  Discharge Instructions     Medication List    TAKE these medications        albuterol 108 (90 BASE) MCG/ACT inhaler  Commonly known as:  PROVENTIL HFA;VENTOLIN HFA  Inhale 2 puffs into the lungs every 6 (six) hours as needed for wheezing or shortness of breath.     albuterol (2.5 MG/3ML) 0.083% nebulizer solution  Commonly known as:  PROVENTIL  Take 2.5 mg by nebulization every 6 (six) hours as needed for wheezing or  shortness of breath.     benzonatate 100 MG capsule  Commonly known as:  TESSALON  Take 1 capsule (100 mg total) by mouth 3 (three) times daily as needed for cough.     budesonide 0.25 MG/2ML nebulizer solution  Commonly known as:  PULMICORT  Take 0.25 mg by nebulization 2 (two) times daily.     busPIRone 7.5 MG tablet  Commonly known as:  BUSPAR  Take 7.5 mg by mouth 3 (three) times daily.     cholecalciferol  1000 UNITS tablet  Commonly known as:  VITAMIN D  Take 1,000 Units by mouth daily.     Fluticasone-Salmeterol 250-50 MCG/DOSE Aepb  Commonly known as:  ADVAIR  Inhale 1 puff into the lungs 2 (two) times daily.     furosemide 40 MG tablet  Commonly known as:  LASIX  Take 1 tablet (40 mg total) by mouth daily.     hydrochlorothiazide 25 MG tablet  Commonly known as:  HYDRODIURIL  Take 25 mg by mouth daily.     levothyroxine 75 MCG tablet  Commonly known as:  SYNTHROID, LEVOTHROID  Take 75 mcg by mouth daily before breakfast.     lisinopril 10 MG tablet  Commonly known as:  PRINIVIL,ZESTRIL  Take 1 tablet (10 mg total) by mouth daily.     multivitamin with minerals tablet  Take 1 tablet by mouth daily.     oxyCODONE 5 MG immediate release tablet  Commonly known as:  Oxy IR/ROXICODONE  Take 1 tablet (5 mg total) by mouth every 4 (four) hours as needed for severe pain.     pantoprazole 40 MG tablet  Commonly known as:  PROTONIX  Take 1 tablet (40 mg total) by mouth daily at 12 noon.     tiotropium 18 MCG inhalation capsule  Commonly known as:  SPIRIVA HANDIHALER  Place 1 capsule (18 mcg total) into inhaler and inhale daily.       No Known Allergies Follow-up Information    Follow up with Simonne Maffucci, MD On 11/14/2014.   Specialty:  Pulmonary Disease   Why:  245 pm    Contact information:   [place] [address] [phone]       Follow up with Bartholome Bill, MD. Schedule an appointment as soon as possible for a visit in 1 week.   Specialty:  Family Medicine   Contact information:   [address] [place] [address] [phone]        The results of significant diagnostics from this hospitalization (including imaging, microbiology, ancillary and laboratory) are listed below for reference.    Significant Diagnostic Studies: Dg Chest Port 1 View  10/28/2014   CLINICAL DATA:  Pulmonary edema  EXAM:  PORTABLE CHEST - 1 VIEW  COMPARISON:  10/24/2014  FINDINGS: Improvement in vascular congestion. Negative for edema or effusion. Heart size upper normal.  Left lower lobe airspace disease better seen on today's study and perhaps slightly increased from the prior study. Possible atelectasis or pneumonia. Follow-up recommended.  Left upper lobe nodule is unchanged from prior studies and is probably calcified.  IMPRESSION: Interval improvement in pulmonary vascular congestion.  Mild progression of left lower lobe atelectasis/infiltrate, possible pneumonia.   Electronically Signed   By: Franchot Gallo M.D.   On: 10/28/2014 07:14   Dg Chest Portable 1 View  10/24/2014   CLINICAL DATA:  Respiratory distress. Patient is unable to communicate.  EXAM: PORTABLE CHEST - 1  VIEW  COMPARISON:  02/22/2014  FINDINGS: Normal heart size. Mild prominence of pulmonary vascularity may indicate mild vascular congestion. No blunting of costophrenic angles. No airspace disease. No pneumothorax. Azygos lobe. Calcified and tortuous aorta.  IMPRESSION: Mild prominence of pulmonary vascularity suggesting mild congestion. Heart size is normal. No focal airspace disease.   Electronically Signed   By: Lucienne Capers M.D.   On: 10/24/2014 21:19    Microbiology: Recent Results (from the past 240 hour(s))  MRSA PCR Screening     Status: Abnormal   Collection Time: 10/25/14 12:39 AM  Result Value Ref Range Status   MRSA by PCR POSITIVE (A) NEGATIVE Final    Comment:        The GeneXpert MRSA Assay (FDA approved for NASAL specimens only), is one component of a comprehensive MRSA colonization surveillance program. It is not intended to diagnose MRSA infection nor to guide or monitor treatment for MRSA infections. RESULT CALLED TO, READ BACK BY AND VERIFIED WITH: SPOKE WITH FREI,L RN [phone] [place],N      Labs: Basic Metabolic Panel:  Recent Labs Lab 10/26/14 0831 10/27/14 0439 10/28/14 0415 10/29/14 0400  10/30/14 0432  NA 134* 134* 135 135 141  K 4.3 3.3* 3.7 3.8 3.6  CL 94* 91* 94* 94* 96  CO2 31 33* 34* 36* 38*  GLUCOSE 130* 125* 134* 99 115*  BUN 32* 35* 27* 26* 31*  CREATININE 0.98 1.06 0.94 1.08 1.15*  CALCIUM 8.6 8.6 8.6 8.6 8.9  MG  --   --  2.1  --   --    Liver Function Tests:  Recent Labs Lab 10/24/14 2107 10/25/14 0235  AST 42* 35  ALT 40* 36*  ALKPHOS 91 82  BILITOT 1.0 0.6  PROT 7.5 7.0  ALBUMIN 3.8 3.5   No results for input(s): LIPASE, AMYLASE in the last 168 hours. No results for input(s): AMMONIA in the last 168 hours. CBC:  Recent Labs Lab 10/24/14 2107 10/25/14 0235 10/26/14 0418 10/27/14 0439 10/29/14 0400 10/30/14 0432  WBC 14.3* 10.2 10.3 12.0* 10.4 11.0*  NEUTROABS 10.4* 9.7*  --   --   --   --   HGB 13.3 12.0 11.9* 13.1 12.6 12.7  HCT 41.6 37.3 38.9 42.4 42.4 42.0  MCV 89.7 89.2 90.5 92.0 93.2 93.3  PLT 270 215 277 317 315 298   Cardiac Enzymes:  Recent Labs Lab 10/25/14 0235 10/25/14 0739 10/25/14 1414 10/26/14 0418 10/27/14 0439  TROPONINI 0.10* 0.09* 0.06* 0.04* 0.04*   BNP: BNP (last 3 results)  Recent Labs  01/18/14 1532 01/19/14 0325 02/22/14 1130  PROBNP 484.2* 423.1 383.7   CBG:  Recent Labs Lab 10/30/14 1141 10/30/14 1658 10/30/14 2204 10/31/14 0731 10/31/14 1126  GLUCAP 220* 173* 139* 142* 141*   Signed:  CHIU, STEPHEN K  [facility] 10/31/2014, 2:15 PM

## 2014-10-31 NOTE — Progress Notes (Signed)
Patient discharged to [facility] via ambulance, discharge packet prepared by CSW and given to EMS transporter for the facility, patient denies any pain, some SOB after transfer to stretcher, PRN breathing treatment given, effective and patient in no distress. No wound noted, skin intact.

## 2014-11-03 ENCOUNTER — Non-Acute Institutional Stay (SKILLED_NURSING_FACILITY): Payer: Medicare Other | Admitting: Internal Medicine

## 2014-11-03 DIAGNOSIS — R739 Hyperglycemia, unspecified: Secondary | ICD-10-CM

## 2014-11-03 DIAGNOSIS — E034 Atrophy of thyroid (acquired): Secondary | ICD-10-CM

## 2014-11-03 DIAGNOSIS — I10 Essential (primary) hypertension: Secondary | ICD-10-CM

## 2014-11-03 DIAGNOSIS — J9621 Acute and chronic respiratory failure with hypoxia: Secondary | ICD-10-CM

## 2014-11-03 DIAGNOSIS — I5033 Acute on chronic diastolic (congestive) heart failure: Secondary | ICD-10-CM

## 2014-11-03 DIAGNOSIS — J441 Chronic obstructive pulmonary disease with (acute) exacerbation: Secondary | ICD-10-CM

## 2014-11-03 DIAGNOSIS — E038 Other specified hypothyroidism: Secondary | ICD-10-CM

## 2014-11-03 NOTE — Progress Notes (Signed)
MRN: [id number] Name: Dawn Montoya  Sex: female Age: 81 y.o. DOB: Mar 17, 1932  [place] #: Helene Kelp Facility/Room: 215 Level Of Care: SNF Provider: Inocencio Homes D Emergency Contacts: Extended Emergency Contact Information Primary Emergency Contact: [place] Phone: [phone] Mobile Phone: [phone] Relation: None    Allergies: Review of patient's allergies indicates no known allergies.  Chief Complaint  Patient presents with  . New Admit To SNF    HPI: Patient is 81 y.o. female who was hospitalized for acute resp failure, now admitted to SNF with generalized weakness for OT/PT.  Past Medical History  Diagnosis Date  . COPD (chronic obstructive pulmonary disease)   . Breast CA   . Hypertension   . HTN (hypertension) 01/18/2014  . Hypothyroidism 01/18/2014  . Medical history non-contributory   . Shortness of breath   . Pneumonia   . Diastolic CHF 05/31/4165    Past Surgical History  Procedure Laterality Date  . Cholecystectomy    . Breast surgery    . Mastectomy Left   . Esophagogastroduodenoscopy N/A 01/19/2014    Procedure: ESOPHAGOGASTRODUODENOSCOPY (EGD);  Surgeon: Jeryl Columbia, MD;  Location: [facility];  Service: Endoscopy;  Laterality: N/A;  . Colonoscopy N/A 01/20/2014    Procedure: COLONOSCOPY;  Surgeon: Winfield Cunas., MD;  Location: [facility];  Service: Endoscopy;  Laterality: N/A;      Medication List       This list is accurate as of: 11/03/14 11:59 PM.  Always use your most recent med list.               albuterol 108 (90 BASE) MCG/ACT inhaler  Commonly known as:  PROVENTIL HFA;VENTOLIN HFA  Inhale 2 puffs into the lungs every 6 (six) hours as needed for wheezing or shortness of breath.     albuterol (2.5 MG/3ML) 0.083% nebulizer solution  Commonly known as:  PROVENTIL  Take 2.5 mg by nebulization every 6 (six) hours as needed for wheezing or shortness of breath.     benzonatate 100 MG capsule   Commonly known as:  TESSALON  Take 1 capsule (100 mg total) by mouth 3 (three) times daily as needed for cough.     budesonide 0.25 MG/2ML nebulizer solution  Commonly known as:  PULMICORT  Take 0.25 mg by nebulization 2 (two) times daily.     busPIRone 7.5 MG tablet  Commonly known as:  BUSPAR  Take 7.5 mg by mouth 3 (three) times daily.     cholecalciferol 1000 UNITS tablet  Commonly known as:  VITAMIN D  Take 1,000 Units by mouth daily.     Fluticasone-Salmeterol 250-50 MCG/DOSE Aepb  Commonly known as:  ADVAIR  Inhale 1 puff into the lungs 2 (two) times daily.     furosemide 40 MG tablet  Commonly known as:  LASIX  Take 1 tablet (40 mg total) by mouth daily.     hydrochlorothiazide 25 MG tablet  Commonly known as:  HYDRODIURIL  Take 25 mg by mouth daily.     levothyroxine 75 MCG tablet  Commonly known as:  SYNTHROID, LEVOTHROID  Take 75 mcg by mouth daily before breakfast.     lisinopril 10 MG tablet  Commonly known as:  PRINIVIL,ZESTRIL  Take 1 tablet (10 mg total) by mouth daily.     multivitamin with minerals tablet  Take 1 tablet by mouth daily.     oxyCODONE 5 MG immediate release tablet  Commonly known as:  Oxy IR/ROXICODONE  Take  1 tablet (5 mg total) by mouth every 4 (four) hours as needed for severe pain.     pantoprazole 40 MG tablet  Commonly known as:  PROTONIX  Take 1 tablet (40 mg total) by mouth daily at 12 noon.     tiotropium 18 MCG inhalation capsule  Commonly known as:  SPIRIVA HANDIHALER  Place 1 capsule (18 mcg total) into inhaler and inhale daily.        No orders of the defined types were placed in this encounter.    Immunization History  Administered Date(s) Administered  . Influenza,inj,Quad PF,36+ Mos 01/21/2014    History  Substance Use Topics  . Smoking status: Former Smoker -- 2.00 packs/day for 50 years    Types: Cigarettes    Quit date: 10/25/1999  . Smokeless tobacco: Not on file  . Alcohol Use: No    Family  history is noncontributory    Review of Systems  DATA OBTAINED: from patient GENERAL:  no fevers, fatigue, appetite changes SKIN: No itching, rash or wounds EYES: No eye pain, redness, discharge EARS: No earache, tinnitus, change in hearing NOSE: No congestion, drainage or bleeding  MOUTH/THROAT: No mouth or tooth pain, No sore throat RESPIRATORY: No cough, wheezing, SOB CARDIAC: No chest pain, palpitations, lower extremity edema  GI: No abdominal pain, No N/V/D or constipation, No heartburn or reflux  GU: No dysuria, frequency or urgency, or incontinence  MUSCULOSKELETAL: No unrelieved bone/joint pain NEUROLOGIC: No headache, dizziness or focal weakness PSYCHIATRIC: No overt anxiety or sadness, No behavior issue.   Filed Vitals:   11/03/14 2104  BP: 120/77  Pulse: 65  Temp: 98.2 F (36.8 C)  Resp: 20    Physical Exam  GENERAL APPEARANCE: Alert, conversant,  No acute distress.  SKIN: No diaphoresis rash HEAD: Normocephalic, atraumatic  EYES: Conjunctiva/lids clear. Pupils round, reactive. EOMs intact.  EARS: External exam WNL, canals clear. Hearing grossly normal.  NOSE: No deformity or discharge.  MOUTH/THROAT: Lips w/o lesions  RESPIRATORY: Breathing is even, unlabored. Lung sounds are diffusely decreased, min wheeze   CARDIOVASCULAR: Heart RRR no murmurs, rubs or gallops. No peripheral edema.   GASTROINTESTINAL: Abdomen is soft, non-tender, not distended w/ normal bowel sounds. GENITOURINARY: Bladder non tender, not distended  MUSCULOSKELETAL: No abnormal joints or musculature NEUROLOGIC:  Cranial nerves 2-12 grossly intact  PSYCHIATRIC: Mood and affect appropriate to situation, no behavioral issues  Patient Active Problem List   Diagnosis Date Noted  . Hyperglycemia 11/08/2014  . Acute on chronic diastolic heart failure 11/94/1740  . Uncontrolled hypertension 10/25/2014  . Acute respiratory failure with hypoxia 10/25/2014  . CHF (congestive heart failure)  10/24/2014  . Acute respiratory failure 02/23/2014  . COPD exacerbation 02/22/2014  . COPD with exacerbation 02/22/2014  . Diastolic CHF 81/44/8185  . COPD (chronic obstructive pulmonary disease) 01/18/2014  . Acute on chronic respiratory failure 01/18/2014  . COPD with acute exacerbation 01/18/2014  . Anemia 01/18/2014  . Melena 01/18/2014  . GIB (gastrointestinal bleeding) 01/18/2014  . Leukocytosis 01/18/2014  . HTN (hypertension) 01/18/2014  . Hypothyroidism 01/18/2014    CBC    Component Value Date/Time   WBC 11.0* 10/30/2014 0432   RBC 4.50 10/30/2014 0432   HGB 12.7 10/30/2014 0432   HCT 42.0 10/30/2014 0432   PLT 298 10/30/2014 0432   MCV 93.3 10/30/2014 0432   LYMPHSABS 0.4* 10/25/2014 0235   MONOABS 0.1 10/25/2014 0235   EOSABS 0.0 10/25/2014 0235   BASOSABS 0.0 10/25/2014 0235    CMP  Component Value Date/Time   NA 141 10/30/2014 0432   K 3.6 10/30/2014 0432   CL 96 10/30/2014 0432   CO2 38* 10/30/2014 0432   GLUCOSE 115* 10/30/2014 0432   BUN 31* 10/30/2014 0432   CREATININE 1.15* 10/30/2014 0432   CALCIUM 8.9 10/30/2014 0432   PROT 7.0 10/25/2014 0235   ALBUMIN 3.5 10/25/2014 0235   AST 35 10/25/2014 0235   ALT 36* 10/25/2014 0235   ALKPHOS 82 10/25/2014 0235   BILITOT 0.6 10/25/2014 0235   GFRNONAA 44* 10/30/2014 0432   GFRAA 51* 10/30/2014 0432    Assessment and Plan  Acute on chronic respiratory failure This was most likely due to a combination of diastolic heart failure and COPD. However, appears to be mostly COPD exacerbation. She was continued on Lasix during this hospital course. Significantly improved. Off BiPAP. O2 weaned to baseline 2L [place]. Pulmonary had been following   COPD with acute exacerbation Improving. Was on oral steroids with Pulmonary recs to wean over one week. O2 was eventually weaned to baseline 2L [place]. Continue antibiotics and nebulizer treatments. Appreciated pulmonology input. Some concern for upper airway issues as  well.   Hyperglycemia No known history of diabetes. Possibly from acute stress and steroid. Continue sliding scale coverage. HbA1c is 6.5. CBGs are improved   Acute on chronic diastolic heart failure Last EF was 60-65% in March 2015. She was given intravenous Lasix. Now on oral Lasix. No need to repeat echocardiogram. Admission EKG was poor quality. Due to concerns for T wave changes EKG was repeated which was also not of great quality, but no T inversions noted on this EKG. Elevated troponin, most likely due to mild demand ischemia from respiratory failure rather than a de novo cardiac process. Continue aspirin. No further evaluation is necessary.   Uncontrolled hypertension This was present in the emergency department. She was placed on nitroglycerin infusion, which was tapered off. Blood pressure is much better. She is on ACE inhibitor, which will be continued   Hypothyroidism Continue with home medications. TSH 1.7     Hennie Duos, MD

## 2014-11-08 ENCOUNTER — Encounter: Payer: Self-pay | Admitting: Internal Medicine

## 2014-11-08 DIAGNOSIS — R739 Hyperglycemia, unspecified: Secondary | ICD-10-CM | POA: Insufficient documentation

## 2014-11-08 NOTE — Assessment & Plan Note (Signed)
This was most likely due to a combination of diastolic heart failure and COPD. However, appears to be mostly COPD exacerbation. She was continued on Lasix during this hospital course. Significantly improved. Off BiPAP. O2 weaned to baseline 2L [place]. Pulmonary had been following

## 2014-11-08 NOTE — Assessment & Plan Note (Signed)
This was present in the emergency department. She was placed on nitroglycerin infusion, which was tapered off. Blood pressure is much better. She is on ACE inhibitor, which will be continued

## 2014-11-08 NOTE — Assessment & Plan Note (Signed)
Improving. Was on oral steroids with Pulmonary recs to wean over one week. O2 was eventually weaned to baseline 2L [place]. Continue antibiotics and nebulizer treatments. Appreciated pulmonology input. Some concern for upper airway issues as well.

## 2014-11-08 NOTE — Assessment & Plan Note (Signed)
Last EF was 60-65% in March 2015. She was given intravenous Lasix. Now on oral Lasix. No need to repeat echocardiogram. Admission EKG was poor quality. Due to concerns for T wave changes EKG was repeated which was also not of great quality, but no T inversions noted on this EKG. Elevated troponin, most likely due to mild demand ischemia from respiratory failure rather than a de novo cardiac process. Continue aspirin. No further evaluation is necessary.

## 2014-11-08 NOTE — Assessment & Plan Note (Signed)
Continue with home medications. TSH 1.7

## 2014-11-08 NOTE — Assessment & Plan Note (Signed)
No known history of diabetes. Possibly from acute stress and steroid. Continue sliding scale coverage. HbA1c is 6.5. CBGs are improved

## 2014-11-14 ENCOUNTER — Inpatient Hospital Stay: Payer: Medicare Other | Admitting: Pulmonary Disease

## 2014-11-18 ENCOUNTER — Non-Acute Institutional Stay (SKILLED_NURSING_FACILITY): Payer: Medicare Other | Admitting: Nurse Practitioner

## 2014-11-18 DIAGNOSIS — I5033 Acute on chronic diastolic (congestive) heart failure: Secondary | ICD-10-CM

## 2014-11-18 DIAGNOSIS — R21 Rash and other nonspecific skin eruption: Secondary | ICD-10-CM

## 2014-11-18 DIAGNOSIS — J449 Chronic obstructive pulmonary disease, unspecified: Secondary | ICD-10-CM

## 2014-11-18 DIAGNOSIS — F411 Generalized anxiety disorder: Secondary | ICD-10-CM

## 2014-11-18 NOTE — Progress Notes (Signed)
Patient ID: Dawn Montoya, female   DOB: 1932-05-20, 81 y.o.   MRN: [id number]    Nursing Home Location:  [facility]   Place of Service: SNF (31)  PCP: Bartholome Bill, MD  No Known Allergies  Chief Complaint  Patient presents with  . Acute Visit    HPI:  Patient is a 81 y.o. female seen today at [facility] seen today for multiple concerns. Pt was recently hospitalized due to acute respiratory failure with CHF and COPD exacerbation. Pt reports bad leg pain over the past several days. Worsening swelling which has now causes redness in bilateral LE. Pt reports she can not even walk now. Also she has increased cough and congestion. Xray obtained and was negative for acute findings. Reports she is coughing up yellow sputum. Shortness of breath at times. Worse when she lays down and coughing.  Also itchy rash on bilateral hands. She applied lotion yesterday which did not help. Granddaughter was called and updated. Reports her grandmother is very anxious. Mentioned this to hospital doctor but nothing was added. Has been on buspar for many years at same dose. pts daughter is going through surgery due to cancer which has increased her anxiety as well as recent hospitalization and trouble breathing also exacerbates problem.   Review of Systems:  Review of Systems  Constitutional: Negative for activity change, appetite change, fatigue and unexpected weight change.  HENT: Positive for congestion and postnasal drip. Negative for hearing loss, rhinorrhea, sinus pressure, sneezing, sore throat and trouble swallowing.   Eyes: Negative.   Respiratory: Positive for cough, shortness of breath and wheezing. Negative for stridor.   Cardiovascular: Positive for leg swelling. Negative for chest pain and palpitations.  Gastrointestinal: Negative for abdominal pain, diarrhea and constipation.  Genitourinary: Negative for dysuria and difficulty urinating.  Musculoskeletal:  Positive for myalgias (pain in legs). Negative for arthralgias.  Skin: Positive for color change (to bilateral LE). Negative for wound.  Neurological: Negative for dizziness and weakness.  Psychiatric/Behavioral: Negative for behavioral problems, confusion and agitation. The patient is nervous/anxious.     Past Medical History  Diagnosis Date  . COPD (chronic obstructive pulmonary disease)   . Breast CA   . Hypertension   . HTN (hypertension) 01/18/2014  . Hypothyroidism 01/18/2014  . Medical history non-contributory   . Shortness of breath   . Pneumonia   . Diastolic CHF 05/26/6628   Past Surgical History  Procedure Laterality Date  . Cholecystectomy    . Breast surgery    . Mastectomy Left   . Esophagogastroduodenoscopy N/A 01/19/2014    Procedure: ESOPHAGOGASTRODUODENOSCOPY (EGD);  Surgeon: Jeryl Columbia, MD;  Location: [facility];  Service: Endoscopy;  Laterality: N/A;  . Colonoscopy N/A 01/20/2014    Procedure: COLONOSCOPY;  Surgeon: Winfield Cunas., MD;  Location: [facility];  Service: Endoscopy;  Laterality: N/A;   Social History:   reports that she quit smoking about 15 years ago. Her smoking use included Cigarettes. She has a 100 pack-year smoking history. She does not have any smokeless tobacco history on file. She reports that she does not drink alcohol or use illicit drugs.  Family History  Problem Relation Age of Onset  . Lung cancer Sister   . Breast cancer Sister   . Breast cancer Daughter     Medications: Patient's Medications  New Prescriptions   No medications on file  Previous Medications   ALBUTEROL (PROVENTIL HFA;VENTOLIN HFA) 108 (90 BASE) MCG/ACT INHALER  Inhale 2 puffs into the lungs every 6 (six) hours as needed for wheezing or shortness of breath.    ALBUTEROL (PROVENTIL) (2.5 MG/3ML) 0.083% NEBULIZER SOLUTION    Take 2.5 mg by nebulization every 6 (six) hours as needed for wheezing or shortness of breath.   BENZONATATE (TESSALON) 100 MG  CAPSULE    Take 1 capsule (100 mg total) by mouth 3 (three) times daily as needed for cough.   BUDESONIDE (PULMICORT) 0.25 MG/2ML NEBULIZER SOLUTION    Take 0.25 mg by nebulization 2 (two) times daily.   BUSPIRONE (BUSPAR) 7.5 MG TABLET    Take 7.5 mg by mouth 3 (three) times daily.   CHOLECALCIFEROL (VITAMIN D) 1000 UNITS TABLET    Take 1,000 Units by mouth daily.   FLUTICASONE-SALMETEROL (ADVAIR) 250-50 MCG/DOSE AEPB    Inhale 1 puff into the lungs 2 (two) times daily.   FUROSEMIDE (LASIX) 40 MG TABLET    Take 1 tablet (40 mg total) by mouth daily.   HYDROCHLOROTHIAZIDE (HYDRODIURIL) 25 MG TABLET    Take 25 mg by mouth daily.   LEVOTHYROXINE (SYNTHROID, LEVOTHROID) 75 MCG TABLET    Take 75 mcg by mouth daily before breakfast.   LISINOPRIL (PRINIVIL,ZESTRIL) 10 MG TABLET    Take 1 tablet (10 mg total) by mouth daily.   MULTIPLE VITAMINS-MINERALS (MULTIVITAMIN WITH MINERALS) TABLET    Take 1 tablet by mouth daily.   OXYCODONE (OXY IR/ROXICODONE) 5 MG IMMEDIATE RELEASE TABLET    Take 1 tablet (5 mg total) by mouth every 4 (four) hours as needed for severe pain.   PANTOPRAZOLE (PROTONIX) 40 MG TABLET    Take 1 tablet (40 mg total) by mouth daily at 12 noon.   TIOTROPIUM (SPIRIVA HANDIHALER) 18 MCG INHALATION CAPSULE    Place 1 capsule (18 mcg total) into inhaler and inhale daily.  Modified Medications   No medications on file  Discontinued Medications   No medications on file     Physical Exam: Filed Vitals:   11/18/14 2003  BP: 126/84  Pulse: 84  Temp: 97.8 F (36.6 C)  Resp: 20  Weight: 258 lb (117.028 kg)    Physical Exam  Constitutional: She is oriented to person, place, and time. She appears well-developed and well-nourished. No distress.  HENT:  Head: Normocephalic and atraumatic.  Mouth/Throat: Oropharynx is clear and moist. No oropharyngeal exudate.  Eyes: Conjunctivae are normal. Pupils are equal, round, and reactive to light.  Neck: Normal range of motion. Neck supple.    Cardiovascular: Normal rate, regular rhythm and normal heart sounds.   Pulmonary/Chest: Effort normal and breath sounds normal. No respiratory distress. She has no wheezes.  Abdominal: Soft. Bowel sounds are normal.  Musculoskeletal: She exhibits edema (2-3+ to bilateral lower extermities ). She exhibits no tenderness.  Neurological: She is alert and oriented to person, place, and time.  Skin: Skin is warm and dry. Rash (diffuse rash to bilateral posterior hands ) noted. She is not diaphoretic. There is erythema (to lower bilateral extermities ).  Psychiatric: Her mood appears anxious. Her speech is rapid and/or pressured.    Labs reviewed: Basic Metabolic Panel:  Recent Labs  01/18/14 2100  01/21/14 1655  10/28/14 0415 10/29/14 0400 10/30/14 0432  NA  --   < > 140  < > 135 135 141  K  --   < > 5.5*  < > 3.7 3.8 3.6  CL  --   < > 101  < > 94* 94* 96  CO2  --   < >  30  < > 34* 36* 38*  GLUCOSE  --   < > 131*  < > 134* 99 115*  BUN  --   < > 41*  < > 27* 26* 31*  CREATININE  --   < > 1.07  < > 0.94 1.08 1.15*  CALCIUM  --   < > 8.8  < > 8.6 8.6 8.9  MG 2.2  --  3.7*  --  2.1  --   --   < > = values in this interval not displayed. Liver Function Tests:  Recent Labs  01/23/14 0355 10/24/14 2107 10/25/14 0235  AST 22 42* 35  ALT 25 40* 36*  ALKPHOS 53 91 82  BILITOT 0.5 1.0 0.6  PROT 5.9* 7.5 7.0  ALBUMIN 3.4* 3.8 3.5    Recent Labs  01/18/14 1532  LIPASE 32   No results for input(s): AMMONIA in the last 8760 hours. CBC:  Recent Labs  02/22/14 1130  10/24/14 2107 10/25/14 0235  10/27/14 0439 10/29/14 0400 10/30/14 0432  WBC 9.3  < > 14.3* 10.2  < > 12.0* 10.4 11.0*  NEUTROABS 7.3  --  10.4* 9.7*  --   --   --   --   HGB 11.1*  < > 13.3 12.0  < > 13.1 12.6 12.7  HCT 34.9*  < > 41.6 37.3  < > 42.4 42.4 42.0  MCV 98.3  < > 89.7 89.2  < > 92.0 93.2 93.3  PLT 411*  < > 270 215  < > 317 315 298  < > = values in this interval not displayed. TSH:  Recent  Labs  01/18/14 2100 10/25/14 0235  TSH 3.197 1.718   A1C: Lab Results  Component Value Date   HGBA1C 6.5* 10/26/2014   Lipid Panel: No results for input(s): CHOL, HDL, LDLCALC, TRIG, CHOLHDL, LDLDIRECT in the last 8760 hours.    Assessment/Plan 1. Acute on chronic diastolic heart failure Worsening edema and shortness of breath, weights weekly with increase in weight from 253 now 258 over the last week.  -will increase lasix to 40 mg to twice daily for 1 week then to resume previous dosing -will get BMP today and recheck in 3 days   2. Chronic obstructive pulmonary disease, unspecified COPD, unspecified chronic bronchitis type -worsening shortness of breath with congestion, conts on neb treatments will also start on doxycycline which should help any acute bronchitis   3. Anxiety state -worse, will add zoloft 25 mg daily for 1 week then to increase to zoloft 50 mg daily  -conts on buspar TID  4. Rash and nonspecific skin eruption -will start triamcinolone 0.5 cream twice daily to hands -to use Eucerin cream QID   5. Cellulitis, bilaterally -doxycycline 100 mg PO BID -elevate LE to help edema  45 min Time TOTAL:  time greater than 50% of total time spent doing pt counseled and coordination of care regarding disease process and treatment plan with pt, granddaughter and staff

## 2014-11-19 MED ORDER — SERTRALINE HCL 50 MG PO TABS: 50.0000 mg | ORAL_TABLET | Freq: Every day | ORAL | Status: DC

## 2014-11-27 ENCOUNTER — Encounter: Payer: Self-pay | Admitting: Internal Medicine

## 2014-11-27 ENCOUNTER — Non-Acute Institutional Stay (SKILLED_NURSING_FACILITY): Payer: Medicare Other | Admitting: Internal Medicine

## 2014-11-27 DIAGNOSIS — I5032 Chronic diastolic (congestive) heart failure: Secondary | ICD-10-CM

## 2014-11-27 LAB — BASIC METABOLIC PANEL
BUN: 25 mg/dL — AB (ref 4–21)
CREATININE: 0.9 mg/dL (ref 0.5–1.1)
Glucose: 111 mg/dL
Potassium: 4 mmol/L (ref 3.4–5.3)
Sodium: 137 mmol/L (ref 137–147)

## 2014-11-27 NOTE — Progress Notes (Signed)
MRN: [id number] Name: Dawn Montoya  Sex: female Age: 81 y.o. DOB: [birth year]  [place] [facility]  Facility/Room:215 Level Of Care: SNF Provider: Inocencio Homes D Emergency Contacts: Extended Emergency Contact Information Primary Emergency Contact: Yvonne Kendall [place] Phone: [phone] Mobile Phone: [phone] Relation: None  Code Status: FULL  Allergies: Review of patient's allergies indicates no known allergies.  Chief Complaint  Patient presents with  . Medical Management of Chronic Issues    HPI: Patient is 81 y.o. female who is being seen for weight gain despite an increase in Lasix and continue LE edema.  Past Medical History  Diagnosis Date  . COPD (chronic obstructive pulmonary disease)   . Breast CA   . Hypertension   . HTN (hypertension) 01/18/2014  . Hypothyroidism 01/18/2014  . Medical history non-contributory   . Shortness of breath   . Pneumonia   . Diastolic CHF 12/23/5496    Past Surgical History  Procedure Laterality Date  . Cholecystectomy    . Breast surgery    . Mastectomy Left   . Esophagogastroduodenoscopy N/A 01/19/2014    Procedure: ESOPHAGOGASTRODUODENOSCOPY (EGD);  Surgeon: Jeryl Columbia, MD;  Location: [facility];  Service: Endoscopy;  Laterality: N/A;  . Colonoscopy N/A 01/20/2014    Procedure: COLONOSCOPY;  Surgeon: Winfield Cunas., MD;  Location: [facility];  Service: Endoscopy;  Laterality: N/A;      Medication List       This list is accurate as of: 11/27/14  8:43 PM.  Always use your most recent med list.               albuterol 108 (90 BASE) MCG/ACT inhaler  Commonly known as:  PROVENTIL HFA;VENTOLIN HFA  Inhale 2 puffs into the lungs every 6 (six) hours as needed for wheezing or shortness of breath.     albuterol (2.5 MG/3ML) 0.083% nebulizer solution  Commonly known as:  PROVENTIL  Take 2.5 mg by nebulization every 6 (six) hours as needed for wheezing or shortness of breath.     benzonatate 100 MG capsule  Commonly known as:  TESSALON  Take 1 capsule (100 mg total) by mouth 3 (three) times daily as needed for cough.     budesonide 0.25 MG/2ML nebulizer solution  Commonly known as:  PULMICORT  Take 0.25 mg by nebulization 2 (two) times daily.     busPIRone 7.5 MG tablet  Commonly known as:  BUSPAR  Take 7.5 mg by mouth 3 (three) times daily.     cholecalciferol 1000 UNITS tablet  Commonly known as:  VITAMIN D  Take 1,000 Units by mouth daily.     Fluticasone-Salmeterol 250-50 MCG/DOSE Aepb  Commonly known as:  ADVAIR  Inhale 1 puff into the lungs 2 (two) times daily.     furosemide 40 MG tablet  Commonly known as:  LASIX  Take 1 tablet (40 mg total) by mouth daily.     hydrochlorothiazide 25 MG tablet  Commonly known as:  HYDRODIURIL  Take 25 mg by mouth daily.     levothyroxine 75 MCG tablet  Commonly known as:  SYNTHROID, LEVOTHROID  Take 75 mcg by mouth daily before breakfast.     lisinopril 10 MG tablet  Commonly known as:  PRINIVIL,ZESTRIL  Take 1 tablet (10 mg total) by mouth daily.     multivitamin with minerals tablet  Take 1 tablet by mouth daily.     oxyCODONE 5 MG immediate release tablet  Commonly known as:  Oxy  IR/ROXICODONE  Take 1 tablet (5 mg total) by mouth every 4 (four) hours as needed for severe pain.     pantoprazole 40 MG tablet  Commonly known as:  PROTONIX  Take 1 tablet (40 mg total) by mouth daily at 12 noon.     sertraline 50 MG tablet  Commonly known as:  ZOLOFT  Take 1 tablet (50 mg total) by mouth daily.     tiotropium 18 MCG inhalation capsule  Commonly known as:  SPIRIVA HANDIHALER  Place 1 capsule (18 mcg total) into inhaler and inhale daily.        No orders of the defined types were placed in this encounter.    Immunization History  Administered Date(s) Administered  . Influenza,inj,Quad PF,36+ Mos 01/21/2014    History  Substance Use Topics  . Smoking status: Former Smoker -- 2.00  packs/day for 50 years    Types: Cigarettes    Quit date: 10/25/1999  . Smokeless tobacco: Not on file  . Alcohol Use: No    Review of Systems  DATA OBTAINED: from patient, nurse GENERAL:  no fevers, fatigue, appetite changes SKIN: No itching, rash HEENT: No complaint RESPIRATORY: No cough, wheezing,  No more SOB than usual CARDIAC: No chest pain, palpitations, =lower extremity edema  GI: No abdominal pain, No N/V/D or constipation, No heartburn or reflux  GU: No dysuria, frequency or urgency, or incontinence  MUSCULOSKELETAL: No unrelieved bone/joint pain NEUROLOGIC: No headache, dizziness  PSYCHIATRIC: No overt anxiety or sadness  Filed Vitals:   11/27/14 1317  BP: 127/63  Pulse: 80  Temp: 96.6 F (35.9 C)  Resp: 20    Physical Exam  GENERAL APPEARANCE: Alert, conversant, No acute distress ;obese SKIN: No diaphoresis rash; L mastectomy HEENT: Unremarkable RESPIRATORY: Breathing is even, unlabored. Lung sounds are clear   CARDIOVASCULAR: Heart RRR no murmurs, rubs or gallops. 1/2+ peripheral edema  GASTROINTESTINAL: Abdomen is soft, non-tender, not distended w/ normal bowel sounds.  GENITOURINARY: Bladder non tender, not distended  MUSCULOSKELETAL: No abnormal joints or musculature NEUROLOGIC: Cranial nerves 2-12 grossly intact. Moves all extremities PSYCHIATRIC: Mood and affect appropriate to situation, no behavioral issues  Patient Active Problem List   Diagnosis Date Noted  . Hyperglycemia 11/08/2014  . Acute on chronic diastolic heart failure 95/18/8416  . Uncontrolled hypertension 10/25/2014  . Acute respiratory failure with hypoxia 10/25/2014  . CHF (congestive heart failure) 10/24/2014  . Acute respiratory failure 02/23/2014  . COPD exacerbation 02/22/2014  . COPD with exacerbation 02/22/2014  . Diastolic CHF, chronic 60/63/0160  . COPD (chronic obstructive pulmonary disease) 01/18/2014  . Acute on chronic respiratory failure 01/18/2014  . COPD with  acute exacerbation 01/18/2014  . Anemia 01/18/2014  . Melena 01/18/2014  . GIB (gastrointestinal bleeding) 01/18/2014  . Leukocytosis 01/18/2014  . HTN (hypertension) 01/18/2014  . Hypothyroidism 01/18/2014    CBC    Component Value Date/Time   WBC 11.0* 10/30/2014 0432   RBC 4.50 10/30/2014 0432   HGB 12.7 10/30/2014 0432   HCT 42.0 10/30/2014 0432   PLT 298 10/30/2014 0432   MCV 93.3 10/30/2014 0432   LYMPHSABS 0.4* 10/25/2014 0235   MONOABS 0.1 10/25/2014 0235   EOSABS 0.0 10/25/2014 0235   BASOSABS 0.0 10/25/2014 0235    CMP     Component Value Date/Time   NA 137 11/27/2014   NA 141 10/30/2014 0432   K 4.0 11/27/2014   CL 96 10/30/2014 0432   CO2 38* 10/30/2014 0432   GLUCOSE 115* 10/30/2014  0432   BUN 25* 11/27/2014   BUN 31* 10/30/2014 0432   CREATININE 0.9 11/27/2014   CREATININE 1.15* 10/30/2014 0432   CALCIUM 8.9 10/30/2014 0432   PROT 7.0 10/25/2014 0235   ALBUMIN 3.5 10/25/2014 0235   AST 35 10/25/2014 0235   ALT 36* 10/25/2014 0235   ALKPHOS 82 10/25/2014 0235   BILITOT 0.6 10/25/2014 0235   GFRNONAA 44* 10/30/2014 0432   GFRAA 51* 10/30/2014 0432    Assessment and Plan  Diastolic CHF, chronic Pt has had pedal edema and lasix was inc to 40 mg BID for a week. During that time pt gained a net weight of a pound and still has pedal edema. BMP was done which was nl. BNP was 74-nl, no heart failure exacerbation. Will change lasix to 80 mg once daily( since that is more efficacious than 40 mg BID. Will repeat BMP in a week.) for a week and place pt on TED hose.    Time spent with chart,labs, 2 different nurses and pt > 25 min Hennie Duos, MD

## 2014-11-27 NOTE — Assessment & Plan Note (Signed)
Pt has had pedal edema and lasix was inc to 40 mg BID for a week. During that time pt gained a net weight of a pound and still has pedal edema. BMP was done which was nl. BNP was 74-nl, no heart failure exacerbation. Will change lasix to 80 mg once daily( since that is more efficacious than 40 mg BID. Will repeat BMP in a week.) for a week and place pt on TED hose.

## 2014-12-15 ENCOUNTER — Encounter: Payer: Self-pay | Admitting: Internal Medicine

## 2014-12-15 ENCOUNTER — Non-Acute Institutional Stay (SKILLED_NURSING_FACILITY): Payer: Medicare Other | Admitting: Internal Medicine

## 2014-12-15 DIAGNOSIS — I5032 Chronic diastolic (congestive) heart failure: Secondary | ICD-10-CM

## 2014-12-15 DIAGNOSIS — I10 Essential (primary) hypertension: Secondary | ICD-10-CM

## 2014-12-15 DIAGNOSIS — N183 Chronic kidney disease, stage 3 unspecified: Secondary | ICD-10-CM

## 2014-12-15 DIAGNOSIS — E872 Acidosis: Secondary | ICD-10-CM | POA: Insufficient documentation

## 2014-12-15 DIAGNOSIS — E8729 Other acidosis: Secondary | ICD-10-CM

## 2014-12-15 DIAGNOSIS — L03115 Cellulitis of right lower limb: Secondary | ICD-10-CM | POA: Diagnosis not present

## 2014-12-15 DIAGNOSIS — J439 Emphysema, unspecified: Secondary | ICD-10-CM | POA: Diagnosis not present

## 2014-12-15 HISTORY — DX: Chronic kidney disease, stage 3 unspecified: N18.30

## 2014-12-15 NOTE — Assessment & Plan Note (Signed)
Severe.; HCO3 on 2/2 was 33, pt is retainer

## 2014-12-15 NOTE — Assessment & Plan Note (Signed)
Bp controlled today and pt not on lasix, med error.On HCTZ 25 and Lisinopril 10 mg which can be increased if needed

## 2014-12-15 NOTE — Progress Notes (Signed)
MRN: [id number] Name: Dawn Montoya  Sex: female Age: 81 y.o. DOB: [birth year]  [place] #: [facility] Facility/Room: 215 Level Of Care: SNF Provider: Inocencio Homes D Emergency Contacts: Extended Emergency Contact Information Primary Emergency Contact: [place] Phone: [phone] Mobile Phone: [phone] Relation: None   Allergies: Review of patient's allergies indicates no known allergies.  Chief Complaint  Patient presents with  . Medical Management of Chronic Issues    HPI: Patient is 81 y.o. female who is being seen for routine issues.  Past Medical History  Diagnosis Date  . COPD (chronic obstructive pulmonary disease)   . Breast CA   . Hypertension   . HTN (hypertension) 01/18/2014  . Hypothyroidism 01/18/2014  . Medical history non-contributory   . Shortness of breath   . Pneumonia   . Diastolic CHF 05/24/7710  . CKD (chronic kidney disease) stage 3, GFR 30-59 ml/min 12/15/2014    Past Surgical History  Procedure Laterality Date  . Cholecystectomy    . Breast surgery    . Mastectomy Left   . Esophagogastroduodenoscopy N/A 01/19/2014    Procedure: ESOPHAGOGASTRODUODENOSCOPY (EGD);  Surgeon: Jeryl Columbia, MD;  Location: [facility];  Service: Endoscopy;  Laterality: N/A;  . Colonoscopy N/A 01/20/2014    Procedure: COLONOSCOPY;  Surgeon: Winfield Cunas., MD;  Location: [facility];  Service: Endoscopy;  Laterality: N/A;      Medication List       This list is accurate as of: 12/15/14  7:47 PM.  Always use your most recent med list.               albuterol 108 (90 BASE) MCG/ACT inhaler  Commonly known as:  PROVENTIL HFA;VENTOLIN HFA  Inhale 2 puffs into the lungs every 6 (six) hours as needed for wheezing or shortness of breath.     albuterol (2.5 MG/3ML) 0.083% nebulizer solution  Commonly known as:  PROVENTIL  Take 2.5 mg by nebulization every 6 (six) hours as needed for wheezing or shortness of breath.     benzonatate 100 MG capsule  Commonly known as:  TESSALON  Take 1 capsule (100 mg total) by mouth 3 (three) times daily as needed for cough.     budesonide 0.25 MG/2ML nebulizer solution  Commonly known as:  PULMICORT  Take 0.25 mg by nebulization 2 (two) times daily.     busPIRone 7.5 MG tablet  Commonly known as:  BUSPAR  Take 7.5 mg by mouth 3 (three) times daily.     cholecalciferol 1000 UNITS tablet  Commonly known as:  VITAMIN D  Take 1,000 Units by mouth daily.     Fluticasone-Salmeterol 250-50 MCG/DOSE Aepb  Commonly known as:  ADVAIR  Inhale 1 puff into the lungs 2 (two) times daily.     furosemide 40 MG tablet  Commonly known as:  LASIX  Take 1 tablet (40 mg total) by mouth daily.     hydrochlorothiazide 25 MG tablet  Commonly known as:  HYDRODIURIL  Take 25 mg by mouth daily.     levothyroxine 75 MCG tablet  Commonly known as:  SYNTHROID, LEVOTHROID  Take 75 mcg by mouth daily before breakfast.     lisinopril 10 MG tablet  Commonly known as:  PRINIVIL,ZESTRIL  Take 1 tablet (10 mg total) by mouth daily.     multivitamin with minerals tablet  Take 1 tablet by mouth daily.     oxyCODONE 5 MG immediate release tablet  Commonly known as:  Oxy  IR/ROXICODONE  Take 1 tablet (5 mg total) by mouth every 4 (four) hours as needed for severe pain.     pantoprazole 40 MG tablet  Commonly known as:  PROTONIX  Take 1 tablet (40 mg total) by mouth daily at 12 noon.     sertraline 50 MG tablet  Commonly known as:  ZOLOFT  Take 1 tablet (50 mg total) by mouth daily.     tiotropium 18 MCG inhalation capsule  Commonly known as:  SPIRIVA HANDIHALER  Place 1 capsule (18 mcg total) into inhaler and inhale daily.        No orders of the defined types were placed in this encounter.    Immunization History  Administered Date(s) Administered  . Influenza,inj,Quad PF,36+ Mos 01/21/2014    History  Substance Use Topics  . Smoking status: Former Smoker -- 2.00  packs/day for 50 years    Types: Cigarettes    Quit date: 10/25/1999  . Smokeless tobacco: Not on file  . Alcohol Use: No    Review of Systems  DATA OBTAINED: from patient, nurse GENERAL:  no fevers, +fatigue, appetite changes SKIN: No itching, rash HEENT: No complaint RESPIRATORY: No cough, wheezing, SOB CARDIAC: No chest pain, palpitations, lower extremity edema  GI: No abdominal pain, No N/V/D or constipation, No heartburn or reflux  GU: No dysuria, frequency or urgency, or incontinence  MUSCULOSKELETAL: R leg pain, denies post pain, pain is in front NEUROLOGIC: No headache, dizziness  PSYCHIATRIC: No overt anxiety or sadness  Filed Vitals:   12/15/14 1312  BP: 114/71  Pulse: 59  Temp: 98.5 F (36.9 C)  Resp: 19    Physical Exam  GENERAL APPEARANCE: Alert, conversant, No acute distress, obese WF  SKIN: BLE mid shin down pink but R side pinker and with heat, TTP HEENT: Unremarkable RESPIRATORY: Breathing is even, unlabored. Lung sounds are clear   CARDIOVASCULAR: Heart RRR no murmurs, rubs or gallops. + non pitting peripheral edema  GASTROINTESTINAL: Abdomen is soft, non-tender, not distended w/ normal bowel sounds.  GENITOURINARY: Bladder non tender, not distended  MUSCULOSKELETAL: No abnormal joints or musculature NEUROLOGIC: Cranial nerves 2-12 grossly intact. Moves all extremities PSYCHIATRIC: Mood and affect appropriate to situation, no behavioral issues  Patient Active Problem List   Diagnosis Date Noted  . Cellulitis of leg, right 12/15/2014  . CKD (chronic kidney disease) stage 3, GFR 30-59 ml/min 12/15/2014  . Carbon dioxide retention 12/15/2014  . Hyperglycemia 11/08/2014  . Acute on chronic diastolic heart failure 90/24/0973  . Uncontrolled hypertension 10/25/2014  . Acute respiratory failure with hypoxia 10/25/2014  . CHF (congestive heart failure) 10/24/2014  . Acute respiratory failure 02/23/2014  . COPD exacerbation 02/22/2014  . COPD with  exacerbation 02/22/2014  . Diastolic CHF, chronic 53/29/9242  . COPD (chronic obstructive pulmonary disease) 01/18/2014  . Acute on chronic respiratory failure 01/18/2014  . COPD with acute exacerbation 01/18/2014  . Anemia 01/18/2014  . Melena 01/18/2014  . GIB (gastrointestinal bleeding) 01/18/2014  . Leukocytosis 01/18/2014  . HTN (hypertension) 01/18/2014  . Hypothyroidism 01/18/2014    CBC    Component Value Date/Time   WBC 11.0* 10/30/2014 0432   RBC 4.50 10/30/2014 0432   HGB 12.7 10/30/2014 0432   HCT 42.0 10/30/2014 0432   PLT 298 10/30/2014 0432   MCV 93.3 10/30/2014 0432   LYMPHSABS 0.4* 10/25/2014 0235   MONOABS 0.1 10/25/2014 0235   EOSABS 0.0 10/25/2014 0235   BASOSABS 0.0 10/25/2014 0235    CMP  Component Value Date/Time   NA 137 11/27/2014   NA 141 10/30/2014 0432   K 4.0 11/27/2014   CL 96 10/30/2014 0432   CO2 38* 10/30/2014 0432   GLUCOSE 115* 10/30/2014 0432   BUN 25* 11/27/2014   BUN 31* 10/30/2014 0432   CREATININE 0.9 11/27/2014   CREATININE 1.15* 10/30/2014 0432   CALCIUM 8.9 10/30/2014 0432   PROT 7.0 10/25/2014 0235   ALBUMIN 3.5 10/25/2014 0235   AST 35 10/25/2014 0235   ALT 36* 10/25/2014 0235   ALKPHOS 82 10/25/2014 0235   BILITOT 0.6 10/25/2014 0235   GFRNONAA 44* 10/30/2014 0432   GFRAA 51* 10/30/2014 0432    Assessment and Plan  Cellulitis of leg, right Doxycycline 100 mg BID for 7 days   Diastolic CHF, chronic Pt looks like carrying fluid, has not gained weight, discovered not on Lasix for several days from med error, corrected, pt back on lasix 40 mg daily. 2/2 BNP is 74.   Uncontrolled hypertension Bp controlled today and pt not on lasix, med error.On HCTZ 25 and Lisinopril 10 mg which can be increased if needed   CKD (chronic kidney disease) stage 3, GFR 30-59 ml/min 2/2 GFR 59, BUN 26/Cr1/02, pt is on ACE, will monitor   COPD (chronic obstructive pulmonary disease) Severe.; HCO3 on 2/2 was 33, pt is  retainer   Carbon dioxide retention Chronically, Bicarb is 33.     Hennie Duos, MD

## 2014-12-15 NOTE — Assessment & Plan Note (Addendum)
Pt looks like carrying fluid, has not gained weight, discovered not on Lasix for several days from med error, corrected, pt back on lasix 40 mg daily. 2/2 BNP is 74.

## 2014-12-15 NOTE — Assessment & Plan Note (Signed)
2/2 GFR 59, BUN 26/Cr1/02, pt is on ACE, will monitor

## 2014-12-15 NOTE — Assessment & Plan Note (Signed)
Doxycycline 100mg BID for 7 days 

## 2014-12-15 NOTE — Assessment & Plan Note (Signed)
Chronically, Bicarb is 33.

## 2014-12-16 ENCOUNTER — Ambulatory Visit (INDEPENDENT_AMBULATORY_CARE_PROVIDER_SITE_OTHER): Payer: Medicare Other | Admitting: Diagnostic Neuroimaging

## 2014-12-16 ENCOUNTER — Encounter: Payer: Self-pay | Admitting: Diagnostic Neuroimaging

## 2014-12-16 VITALS — BP 120/78 | HR 82 | Ht 63.5 in

## 2014-12-16 DIAGNOSIS — R269 Unspecified abnormalities of gait and mobility: Secondary | ICD-10-CM

## 2014-12-16 DIAGNOSIS — R29898 Other symptoms and signs involving the musculoskeletal system: Secondary | ICD-10-CM | POA: Diagnosis not present

## 2014-12-16 DIAGNOSIS — R200 Anesthesia of skin: Secondary | ICD-10-CM

## 2014-12-16 DIAGNOSIS — R292 Abnormal reflex: Secondary | ICD-10-CM

## 2014-12-16 DIAGNOSIS — R208 Other disturbances of skin sensation: Secondary | ICD-10-CM

## 2014-12-16 DIAGNOSIS — G252 Other specified forms of tremor: Secondary | ICD-10-CM | POA: Diagnosis not present

## 2014-12-16 NOTE — Patient Instructions (Signed)
I will check additional testing (MRI brain and neck; lab testing).

## 2014-12-16 NOTE — Progress Notes (Signed)
[facility]  PATIENT: Dawn Montoya DOB: Aug 17, 1932  REFERRING CLINICIAN: Inocencio Homes, MD HISTORY FROM: patient  REASON FOR VISIT: new consult    HISTORICAL  CHIEF COMPLAINT:  Chief Complaint  Patient presents with  . New Evaluation    hand tremor     HISTORY OF PRESENT ILLNESS:   81 year old right-handed female here for evaluation of tremor and numbness in hands and feet.  Patient reports pins and needles in hands and fingers, right greater than left, with swollen joints for several months. She also has had tremor for the past one month. She has difficulty with holding cup, dropping things, shaking. She has some shaking when she is doing certain actions. No family history of tremor. She is having some numbness and tingling in the toes and feet. She is having some dyspnea on exertion, especially if she walks 1 block.  Past medical history of COPD on home oxygen, diastolic congestive heart failure, hypothyroidism, hypertension. Patient was admitted to hospital in January 2016 for shortness of breath.  REVIEW OF SYSTEMS: Full 14 system review of systems performed and notable only for swelling in legs shortness of breath blurred vision tremor anxiety not asleep insomnia.  ALLERGIES: No Known Allergies  HOME MEDICATIONS: Outpatient Prescriptions Prior to Visit  Medication Sig Dispense Refill  . albuterol (PROVENTIL HFA;VENTOLIN HFA) 108 (90 BASE) MCG/ACT inhaler Inhale 2 puffs into the lungs every 6 (six) hours as needed for wheezing or shortness of breath.     Marland Kitchen albuterol (PROVENTIL) (2.5 MG/3ML) 0.083% nebulizer solution Take 2.5 mg by nebulization every 6 (six) hours as needed for wheezing or shortness of breath.    . benzonatate (TESSALON) 100 MG capsule Take 1 capsule (100 mg total) by mouth 3 (three) times daily as needed for cough. 20 capsule 0  . budesonide (PULMICORT) 0.25 MG/2ML nebulizer solution Take 0.25 mg by nebulization 2 (two) times daily.     . busPIRone (BUSPAR) 7.5 MG tablet Take 7.5 mg by mouth 3 (three) times daily.    . cholecalciferol (VITAMIN D) 1000 UNITS tablet Take 1,000 Units by mouth daily.    . Fluticasone-Salmeterol (ADVAIR) 250-50 MCG/DOSE AEPB Inhale 1 puff into the lungs 2 (two) times daily.    . furosemide (LASIX) 40 MG tablet Take 1 tablet (40 mg total) by mouth daily. 30 tablet 0  . hydrochlorothiazide (HYDRODIURIL) 25 MG tablet Take 25 mg by mouth daily.    Marland Kitchen levothyroxine (SYNTHROID, LEVOTHROID) 75 MCG tablet Take 75 mcg by mouth daily before breakfast.    . lisinopril (PRINIVIL,ZESTRIL) 10 MG tablet Take 1 tablet (10 mg total) by mouth daily. 30 tablet 0  . Multiple Vitamins-Minerals (MULTIVITAMIN WITH MINERALS) tablet Take 1 tablet by mouth daily.    Marland Kitchen oxyCODONE (OXY IR/ROXICODONE) 5 MG immediate release tablet Take 1 tablet (5 mg total) by mouth every 4 (four) hours as needed for severe pain. 15 tablet 0  . pantoprazole (PROTONIX) 40 MG tablet Take 1 tablet (40 mg total) by mouth daily at 12 noon. 30 tablet 0  . sertraline (ZOLOFT) 50 MG tablet Take 1 tablet (50 mg total) by mouth daily. 30 tablet 3  . tiotropium (SPIRIVA HANDIHALER) 18 MCG inhalation capsule Place 1 capsule (18 mcg total) into inhaler and inhale daily. 30 capsule 12   No facility-administered medications prior to visit.    PAST MEDICAL HISTORY: Past Medical History  Diagnosis Date  . COPD (chronic obstructive pulmonary disease)   . Breast CA   . Hypertension   .  HTN (hypertension) 01/18/2014  . Hypothyroidism 01/18/2014  . Medical history non-contributory   . Shortness of breath   . Pneumonia   . Diastolic CHF 12/27/4654  . CKD (chronic kidney disease) stage 3, GFR 30-59 ml/min 12/15/2014    PAST SURGICAL HISTORY: Past Surgical History  Procedure Laterality Date  . Cholecystectomy    . Breast surgery    . Mastectomy Left   . Esophagogastroduodenoscopy N/A 01/19/2014    Procedure: ESOPHAGOGASTRODUODENOSCOPY (EGD);  Surgeon: Jeryl Columbia, MD;  Location: [facility];  Service: Endoscopy;  Laterality: N/A;  . Colonoscopy N/A 01/20/2014    Procedure: COLONOSCOPY;  Surgeon: Winfield Cunas., MD;  Location: [facility];  Service: Endoscopy;  Laterality: N/A;    FAMILY HISTORY: Family History  Problem Relation Age of Onset  . Lung cancer Sister   . Breast cancer Sister   . Breast cancer Daughter     SOCIAL HISTORY:  History   Social History  . Marital Status: Unknown    Spouse Name: N/A  . Number of Children: N/A  . Years of Education: N/A   Occupational History  . Not on file.   Social History Main Topics  . Smoking status: Former Smoker -- 2.00 packs/day for 50 years    Types: Cigarettes    Quit date: 10/25/1999  . Smokeless tobacco: Not on file  . Alcohol Use: No  . Drug Use: No  . Sexual Activity: No   Other Topics Concern  . Not on file   Social History Narrative     PHYSICAL EXAM  Filed Vitals:   12/16/14 0851  BP: 120/78  Pulse: 82  Height: 5' 3.5" (1.613 m)    Body mass index is 0.00 kg/(m^2).  No exam data present  No flowsheet data found.  GENERAL EXAM: Patient is in no distress; well developed, nourished and groomed; neck is supple; SLIGHTLY NERVOUS APPEARING; JITTERY  CARDIOVASCULAR: Regular rate and rhythm, no murmurs, no carotid bruits  NEUROLOGIC: MENTAL STATUS: awake, alert, oriented to person, place and time, recent and remote memory intact, normal attention and concentration, language fluent, comprehension intact, naming intact, fund of knowledge appropriate CRANIAL NERVE: no papilledema on fundoscopic exam, pupils equal and reactive to light, visual fields full to confrontation, extraocular muscles intact, no nystagmus, facial sensation and strength symmetric, hearing DECR IN LEFT EAR, palate elevates symmetrically, uvula midline, shoulder shrug symmetric, tongue midline. MOTOR: BUE POSTURAL > ACTION REMOR (RUE > LUE); NO REST TREMOR; MILD BRADYKINESIA IN RUE;  BUE STRENGTH 4+; BLE INCR TONE (HF 2-3, KE/KF3, DF 5).  SENSORY: ABSENT VIB AT TOES, ANKLES, LEFT KNEEL DECR LT, PP, TEMP IN BLE; BUE ALLODYNIA/HYPERSENS TO PP IN FINGERTIPS COORDINATION: finger-nose-finger --> INTENTION TREMOR; FFM SLIGHTLY SLOW ON RIGHT SIDE REFLEXES: BUE 3 WITH SPREAD; KNEES 3 WITH SPREAD; ANKLES 1 GAIT/STATION: IN WHEELCHAIR; CANNOT STAND UNASSISTED    DIAGNOSTIC DATA (LABS, IMAGING, TESTING) - I reviewed patient records, labs, notes, testing and imaging myself where available.  Lab Results  Component Value Date   WBC 11.0* 10/30/2014   HGB 12.7 10/30/2014   HCT 42.0 10/30/2014   MCV 93.3 10/30/2014   PLT 298 10/30/2014      Component Value Date/Time   NA 137 11/27/2014   NA 141 10/30/2014 0432   K 4.0 11/27/2014   CL 96 10/30/2014 0432   CO2 38* 10/30/2014 0432   GLUCOSE 115* 10/30/2014 0432   BUN 25* 11/27/2014   BUN 31* 10/30/2014 0432   CREATININE 0.9  11/27/2014   CREATININE 1.15* 10/30/2014 0432   CALCIUM 8.9 10/30/2014 0432   PROT 7.0 10/25/2014 0235   ALBUMIN 3.5 10/25/2014 0235   AST 35 10/25/2014 0235   ALT 36* 10/25/2014 0235   ALKPHOS 82 10/25/2014 0235   BILITOT 0.6 10/25/2014 0235   GFRNONAA 44* 10/30/2014 0432   GFRAA 51* 10/30/2014 0432   No results found for: CHOL, HDL, LDLCALC, LDLDIRECT, TRIG, CHOLHDL Lab Results  Component Value Date   HGBA1C 6.5* 10/26/2014   Lab Results  Component Value Date   VITAMINB12 415 01/18/2014   Lab Results  Component Value Date   TSH 1.718 10/25/2014       ASSESSMENT AND PLAN  81 y.o. year old female here with progressive tremor, pain, numbness in bilateral hands since past 3 months. Also with progressive gait decline over past 6-12 months. Exam notable for postural/action tremor, decr sens in BLE, hypersens/painful fingertips, hyperreflexia in BUE and BLE.   Ddx: CNS (vascular, inflamm, autoimmune), cervical myelopathy/radiculopathy, peripheral neuropathy (carpal tunnel syndrome,  diabetic neuropathy), B12 deficiency, essential tremor, enhanced physiologic tremor, medication tremor  PLAN: - additional workup - consider gabapentin, Lyrica, cymbalta or amitriptyline for neuropathy pain control; defer to PCP to start given patient's polypharmacy, multiple medical conditions, and potential side effects of these medications (increase peripheral edema, weight gain, CNS depression)   Orders Placed This Encounter  Procedures  . MR Brain Wo Contrast  . MR Cervical Spine Wo Contrast  . Vitamin B12   Return in about 3 months (around 03/16/2015).    Penni Bombard, MD 2/64/1583, 0:94 AM Certified in Neurology, Neurophysiology and Neuroimaging  [facility] [address], [place], [place] [address] [phone]

## 2014-12-17 LAB — VITAMIN B12: VITAMIN B 12: 411 pg/mL (ref 211–946)

## 2014-12-18 ENCOUNTER — Telehealth: Payer: Self-pay | Admitting: *Deleted

## 2014-12-18 NOTE — Telephone Encounter (Signed)
Spoke with LPN at [facility] and asked her to inform the pt that her recent labwork was normal. She said she would.

## 2014-12-25 ENCOUNTER — Non-Acute Institutional Stay (SKILLED_NURSING_FACILITY): Payer: Medicare Other | Admitting: Internal Medicine

## 2014-12-25 DIAGNOSIS — J441 Chronic obstructive pulmonary disease with (acute) exacerbation: Secondary | ICD-10-CM

## 2014-12-25 DIAGNOSIS — I5032 Chronic diastolic (congestive) heart failure: Secondary | ICD-10-CM

## 2014-12-25 NOTE — Progress Notes (Addendum)
MRN: [id number] Name: Dawn Montoya  Sex: female Age: 81 y.o. DOB: [birth year]  [facility] Facility/Room:215 Level Of Care: SNF Provider: Inocencio Homes D Emergency Contacts: Extended Emergency Contact Information Primary Emergency Contact: [place] of [place] Phone: [phone] Mobile Phone: [phone] Relation: None Secondary Emergency Contact: Seymour [place] Relation: Daughter  Code Status:   Allergies: Review of patient's allergies indicates no known allergies.  Chief Complaint  Patient presents with  . Acute Visit    HPI: Patient is 81 y.o. female who nursing has asked me to see because pt has not lost any weight with her diuretic as planned.  Past Medical History  Diagnosis Date  . COPD (chronic obstructive pulmonary disease)   . Breast CA   . Hypertension   . HTN (hypertension) 01/18/2014  . Hypothyroidism 01/18/2014  . Medical history non-contributory   . Shortness of breath   . Pneumonia   . Diastolic CHF 05/27/6658  . CKD (chronic kidney disease) stage 3, GFR 30-59 ml/min 12/15/2014    Past Surgical History  Procedure Laterality Date  . Cholecystectomy    . Breast surgery    . Mastectomy Left   . Esophagogastroduodenoscopy N/A 01/19/2014    Procedure: ESOPHAGOGASTRODUODENOSCOPY (EGD);  Surgeon: Jeryl Columbia, MD;  Location: [facility];  Service: Endoscopy;  Laterality: N/A;  . Colonoscopy N/A 01/20/2014    Procedure: COLONOSCOPY;  Surgeon: Winfield Cunas., MD;  Location: [facility];  Service: Endoscopy;  Laterality: N/A;      Medication List       This list is accurate as of: 12/25/14 11:59 PM.  Always use your most recent med list.               albuterol 108 (90 BASE) MCG/ACT inhaler  Commonly known as:  PROVENTIL HFA;VENTOLIN HFA  Inhale 2 puffs into the lungs every 6 (six) hours as needed for wheezing or shortness of breath.     albuterol (2.5 MG/3ML) 0.083% nebulizer solution   Commonly known as:  PROVENTIL  Take 2.5 mg by nebulization every 6 (six) hours as needed for wheezing or shortness of breath.     benzonatate 100 MG capsule  Commonly known as:  TESSALON  Take 1 capsule (100 mg total) by mouth 3 (three) times daily as needed for cough.     budesonide 0.25 MG/2ML nebulizer solution  Commonly known as:  PULMICORT  Take 0.25 mg by nebulization 2 (two) times daily.     busPIRone 7.5 MG tablet  Commonly known as:  BUSPAR  Take 7.5 mg by mouth 3 (three) times daily.     cholecalciferol 1000 UNITS tablet  Commonly known as:  VITAMIN D  Take 1,000 Units by mouth daily.     Fluticasone-Salmeterol 250-50 MCG/DOSE Aepb  Commonly known as:  ADVAIR  Inhale 1 puff into the lungs 2 (two) times daily.     furosemide 40 MG tablet  Commonly known as:  LASIX  Take 1 tablet (40 mg total) by mouth daily.     hydrochlorothiazide 25 MG tablet  Commonly known as:  HYDRODIURIL  Take 25 mg by mouth daily.     levothyroxine 75 MCG tablet  Commonly known as:  SYNTHROID, LEVOTHROID  Take 75 mcg by mouth daily before breakfast.     lisinopril 10 MG tablet  Commonly known as:  PRINIVIL,ZESTRIL  Take 1 tablet (10 mg total) by mouth daily.     multivitamin with minerals tablet  Take 1 tablet by mouth daily.     oxyCODONE 5 MG immediate release tablet  Commonly known as:  Oxy IR/ROXICODONE  Take 1 tablet (5 mg total) by mouth every 4 (four) hours as needed for severe pain.     pantoprazole 40 MG tablet  Commonly known as:  PROTONIX  Take 1 tablet (40 mg total) by mouth daily at 12 noon.     sertraline 50 MG tablet  Commonly known as:  ZOLOFT  Take 1 tablet (50 mg total) by mouth daily.     tiotropium 18 MCG inhalation capsule  Commonly known as:  SPIRIVA HANDIHALER  Place 1 capsule (18 mcg total) into inhaler and inhale daily.        No orders of the defined types were placed in this encounter.    Immunization History  Administered Date(s)  Administered  . Influenza,inj,Quad PF,36+ Mos 01/21/2014    History  Substance Use Topics  . Smoking status: Former Smoker -- 2.00 packs/day for 50 years    Types: Cigarettes    Quit date: 10/25/1999  . Smokeless tobacco: Not on file  . Alcohol Use: No    Review of Systems  DATA OBTAINED: from patient, nurse GENERAL:  no fevers, fatigue, appetite changes SKIN: No itching, rash HEENT: No complaint RESPIRATORY: No cough, wheezing, +SOB, + DOE CARDIAC: No chest pain, palpitations,+ lower extremity edema  GI: No abdominal pain, No N/V/D or constipation, No heartburn or reflux  GU: No dysuria, frequency or urgency, or incontinence  MUSCULOSKELETAL: No unrelieved bone/joint pain NEUROLOGIC: No headache, dizziness  PSYCHIATRIC: No overt anxiety or sadness  Filed Vitals:   12/25/14 2138  BP: 100/53  Pulse: 122  Temp: 96.5 F (35.8 C)  Resp: 20    Physical Exam  GENERAL APPEARANCE: Alert, conversant, No acute distress  SKIN: No diaphoresis rash HEENT: Unremarkable RESPIRATORY: Breathing is even, unlabored. Lung sounds are tight BS   CARDIOVASCULAR: Heart RRR no murmurs, rubs or gallops. Non pitting peripheral edema ; HR is not increased GASTROINTESTINAL: Abdomen is soft, non-tender, not distended w/ normal bowel sounds.  GENITOURINARY: Bladder non tender, not distended  MUSCULOSKELETAL: No abnormal joints or musculature NEUROLOGIC: Cranial nerves 2-12 grossly intact. Moves all extremities PSYCHIATRIC: Mood and affect appropriate to situation, no behavioral issues  Patient Active Problem List   Diagnosis Date Noted  . Cellulitis of leg, right 12/15/2014  . CKD (chronic kidney disease) stage 3, GFR 30-59 ml/min 12/15/2014  . Carbon dioxide retention 12/15/2014  . Hyperglycemia 11/08/2014  . Acute on chronic diastolic heart failure 41/32/4401  . Uncontrolled hypertension 10/25/2014  . Acute respiratory failure with hypoxia 10/25/2014  . CHF (congestive heart failure)  10/24/2014  . Acute respiratory failure 02/23/2014  . COPD exacerbation 02/22/2014  . COPD with exacerbation 02/22/2014  . Diastolic CHF, chronic 02/72/5366  . COPD (chronic obstructive pulmonary disease) 01/18/2014  . Acute on chronic respiratory failure 01/18/2014  . COPD with acute exacerbation 01/18/2014  . Anemia 01/18/2014  . Melena 01/18/2014  . GIB (gastrointestinal bleeding) 01/18/2014  . Leukocytosis 01/18/2014  . HTN (hypertension) 01/18/2014  . Hypothyroidism 01/18/2014    CBC    Component Value Date/Time   WBC 11.0* 10/30/2014 0432   RBC 4.50 10/30/2014 0432   HGB 12.7 10/30/2014 0432   HCT 42.0 10/30/2014 0432   PLT 298 10/30/2014 0432   MCV 93.3 10/30/2014 0432   LYMPHSABS 0.4* 10/25/2014 0235   MONOABS 0.1 10/25/2014 0235   EOSABS 0.0 10/25/2014 0235  BASOSABS 0.0 10/25/2014 0235    CMP     Component Value Date/Time   NA 137 11/27/2014   NA 141 10/30/2014 0432   K 4.0 11/27/2014   CL 96 10/30/2014 0432   CO2 38* 10/30/2014 0432   GLUCOSE 115* 10/30/2014 0432   BUN 25* 11/27/2014   BUN 31* 10/30/2014 0432   CREATININE 0.9 11/27/2014   CREATININE 1.15* 10/30/2014 0432   CALCIUM 8.9 10/30/2014 0432   PROT 7.0 10/25/2014 0235   ALBUMIN 3.5 10/25/2014 0235   AST 35 10/25/2014 0235   ALT 36* 10/25/2014 0235   ALKPHOS 82 10/25/2014 0235   BILITOT 0.6 10/25/2014 0235   GFRNONAA 44* 10/30/2014 0432   GFRAA 51* 10/30/2014 0432    Assessment and Plan  CHF (congestive heart failure) Pt had had some weight gain and lasix 40 mg was restarted. Pt has not had any response according to her weight being stable. No report of pedal edema and I seen none as well. Will inc lasix to 80 mg q am, especially since I am going to put pt on prednisone taper.   COPD with acute exacerbation While looking for signs of CHF I found pt with very tight BS. She admits to more SOB than usual and SOB with activity. NO cough or fever. Pt may have component of HF and have  increased Lasix to 80 mg q am up from 40 mg daily. Will put pt on a prednisone taper 60/40/40/20/20/20 and monitor response and weight.     Hennie Duos, MD

## 2014-12-28 ENCOUNTER — Encounter: Payer: Self-pay | Admitting: Internal Medicine

## 2014-12-28 NOTE — Assessment & Plan Note (Addendum)
Pt had had some weight gain and lasix 40 mg was restarted. Pt has not had any response according to her weight being stable with  report of pedal edema. Will inc lasix to 80 mg q am, especially since I am going to put pt on prednisone taper.

## 2014-12-28 NOTE — Assessment & Plan Note (Addendum)
While looking for signs of CHF I found pt with very tight BS. She admits to more SOB than usual and SOB with activity. NO cough or fever. Pt may have component of HF and have increased Lasix to 80 mg q am up from 40 mg daily. Will put pt on a prednisone taper 60/40/40/20/20/20 and monitor response and weight. Pt alreadfy has scheduled nebs and O2.

## 2014-12-30 ENCOUNTER — Encounter: Payer: Self-pay | Admitting: Pulmonary Disease

## 2014-12-30 ENCOUNTER — Ambulatory Visit (INDEPENDENT_AMBULATORY_CARE_PROVIDER_SITE_OTHER): Payer: Medicare Other | Admitting: Pulmonary Disease

## 2014-12-30 VITALS — BP 124/66 | HR 73

## 2014-12-30 DIAGNOSIS — J441 Chronic obstructive pulmonary disease with (acute) exacerbation: Secondary | ICD-10-CM

## 2014-12-30 DIAGNOSIS — J432 Centrilobular emphysema: Secondary | ICD-10-CM

## 2014-12-30 DIAGNOSIS — I5032 Chronic diastolic (congestive) heart failure: Secondary | ICD-10-CM | POA: Diagnosis not present

## 2014-12-30 NOTE — Assessment & Plan Note (Signed)
Her leg swelling has improved significantly since she was hospitalized but is still quite a problem. I recommend that she continue taking her heart failure regimen including an ACE inhibitor, hydrochlorothiazide and Lasix as she is doing. Also, continued efforts at physical therapy for mobility will be helpful.

## 2014-12-30 NOTE — Assessment & Plan Note (Signed)
Dawn Montoya has gold grade D COPD based on her severe airflow obstruction, and severe symptoms and recent hospitalization. I feel that because she is doing so well now from a breathing standpoint if we could help her overcome the arthritis issues and get her in the pulmonary rehabilitation we could actually see a significant improvement in her symptoms over the next 12 months. However, she has multiple comorbid illnesses and she is quite deconditioned and so this will be a formidable challenge.  -O2 therapy: Continue 2 L continuously -Immunizations: We will review her pneumonia vaccination history -Tobacco use: Quit smoking many years ago -Exercise: Encouraged physical therapy at her nursing home, ideally I would like for her to be enrolled in pulmonary rehabilitation -Bronchodilator therapy: Continue Advair and Spiriva as well as when necessary albuterol -Exacerbation prevention: Spiriva

## 2014-12-30 NOTE — Patient Instructions (Signed)
We will request records from your pulmonologist in [place] taking the [place] as you are doing Keep using oxygen at 2L continuously Exercise regularly and keep participating in physical therapy We will see you back in 3 months or sooner if needed

## 2014-12-30 NOTE — Progress Notes (Signed)
Subjective:    Patient ID: Dawn Montoya, female    DOB: 1932-03-02, 81 y.o.   MRN: [id number]  Synopsis: Dawn Montoya has gold grade D COPD and was hospitalized in January 2016 for a COPD exacerbation which was complicated by diastolic heart failure. She was sent to a skilled nursing facility where she continues to participate in rehabilitation on a daily basis.  HPI Chief Complaint  Patient presents with  . Hospitalization Follow-up    pt recently hospitalized for COPD exacerbation.  Pt currently c/o sob with exertion, prod cough with yellow/white mucus.    Raynah says that she is feeling much beter since ocming home from the hosita;Marland Kitchen  She is staying at [facility] which is a permament place for her right now.  She is participatin gin physical therapy tiwce per day at [facility].  She says that she has noticed that her oxygen level will drop when she walks, sometime down to 82%.  She wheezes daily which is better with albuterol.  She tries to walk for exercise regularly but her oxygen level drops so it keeps her from walking too far.   Her leg swelling has improved.  She has a lot of leg pain in the right knee and is currently participatin in PT for that.  She has trouble breathing at night and she waskes up frequently to urinate and she also wakes up a lot gasping for air.  She feels sleepy in the daytime a lot, but she rarely naps.    Past Medical History  Diagnosis Date  . COPD (chronic obstructive pulmonary disease)   . Breast CA   . Hypertension   . HTN (hypertension) 01/18/2014  . Hypothyroidism 01/18/2014  . Medical history non-contributory   . Shortness of breath   . Pneumonia   . Diastolic CHF 0/05/6577  . CKD (chronic kidney disease) stage 3, GFR 30-59 ml/min 12/15/2014      Review of Systems  Constitutional: Positive for fatigue. Negative for fever and chills.  HENT: Negative for postnasal drip, rhinorrhea and sinus pressure.   Respiratory: Positive for cough and  shortness of breath. Negative for wheezing.   Cardiovascular: Positive for leg swelling. Negative for chest pain and palpitations.       Objective:   Physical Exam  Filed Vitals:   12/30/14 1535  BP: 124/66  Pulse: 73  SpO2: 94%   2L [place] continuously  Ambulated 100 feet on RA and her O2 saturation was normal  Gen: chronically ill appearing, wheelchair HEENT: NCAT, PERRL, EOMi, OP clear, neck supple without masses PULM: Few crackles in bases but otherwise clear CV: RRR, no mgr, cannot assess JVD AB: BS+, soft, nontender, no hsm Ext: warm, chronic LE edema, no clubbing, no cyanosis Derm: chronic appearing redness and chronic venous stasis changes of her legs Neuro: A&Ox4, CN II-XII intact, strength 5/5 in all 4 extremities  Records from her nursing home physician were reviewed today in clinic as well as the notes from her recent hospital stay    Assessment & Plan:   COPD (chronic obstructive pulmonary disease) [name] has gold grade D COPD based on her severe airflow obstruction, and severe symptoms and recent hospitalization. I feel that because she is doing so well now from a breathing standpoint if we could help her overcome the arthritis issues and get her in the pulmonary rehabilitation we could actually see a significant improvement in her symptoms over the next 12 months. However, she has multiple comorbid illnesses and she is  quite deconditioned and so this will be a formidable challenge.  -O2 therapy: Continue 2 L continuously -Immunizations: We will review her pneumonia vaccination history -Tobacco use: Quit smoking many years ago -Exercise: Encouraged physical therapy at her nursing home, ideally I would like for her to be enrolled in pulmonary rehabilitation -Bronchodilator therapy: Continue Advair and Spiriva as well as when necessary albuterol -Exacerbation prevention: Spiriva    Diastolic CHF, chronic Her leg swelling has improved significantly since she was  hospitalized but is still quite a problem. I recommend that she continue taking her heart failure regimen including an ACE inhibitor, hydrochlorothiazide and Lasix as she is doing. Also, continued efforts at physical therapy for mobility will be helpful.    Updated Medication List Outpatient Encounter Prescriptions as of 12/30/2014  Medication Sig  . albuterol (PROVENTIL HFA;VENTOLIN HFA) 108 (90 BASE) MCG/ACT inhaler Inhale 2 puffs into the lungs every 6 (six) hours as needed for wheezing or shortness of breath.   Marland Kitchen albuterol (PROVENTIL) (2.5 MG/3ML) 0.083% nebulizer solution Take 2.5 mg by nebulization every 6 (six) hours as needed for wheezing or shortness of breath.  . benzonatate (TESSALON) 100 MG capsule Take 1 capsule (100 mg total) by mouth 3 (three) times daily as needed for cough.  . budesonide (PULMICORT) 0.25 MG/2ML nebulizer solution Take 0.25 mg by nebulization 2 (two) times daily.  . busPIRone (BUSPAR) 7.5 MG tablet Take 7.5 mg by mouth 3 (three) times daily.  . cholecalciferol (VITAMIN D) 1000 UNITS tablet Take 1,000 Units by mouth daily.  . Fluticasone-Salmeterol (ADVAIR) 250-50 MCG/DOSE AEPB Inhale 1 puff into the lungs 2 (two) times daily.  . furosemide (LASIX) 40 MG tablet Take 1 tablet (40 mg total) by mouth daily.  . hydrochlorothiazide (HYDRODIURIL) 25 MG tablet Take 25 mg by mouth daily.  Marland Kitchen levothyroxine (SYNTHROID, LEVOTHROID) 75 MCG tablet Take 75 mcg by mouth daily before breakfast.  . lisinopril (PRINIVIL,ZESTRIL) 10 MG tablet Take 1 tablet (10 mg total) by mouth daily.  . Multiple Vitamins-Minerals (MULTIVITAMIN WITH MINERALS) tablet Take 1 tablet by mouth daily.  Marland Kitchen oxyCODONE (OXY IR/ROXICODONE) 5 MG immediate release tablet Take 1 tablet (5 mg total) by mouth every 4 (four) hours as needed for severe pain.  . pantoprazole (PROTONIX) 40 MG tablet Take 1 tablet (40 mg total) by mouth daily at 12 noon.  . sertraline (ZOLOFT) 50 MG tablet Take 1 tablet (50 mg total) by  mouth daily.  Marland Kitchen tiotropium (SPIRIVA HANDIHALER) 18 MCG inhalation capsule Place 1 capsule (18 mcg total) into inhaler and inhale daily.

## 2015-01-01 ENCOUNTER — Encounter: Payer: Self-pay | Admitting: Pulmonary Disease

## 2015-01-01 DIAGNOSIS — R911 Solitary pulmonary nodule: Secondary | ICD-10-CM | POA: Insufficient documentation

## 2015-01-07 ENCOUNTER — Ambulatory Visit
Admission: RE | Admit: 2015-01-07 | Discharge: 2015-01-07 | Disposition: A | Discharge status: Home / Self Care | Payer: Medicare Other | Source: Ambulatory Visit | Attending: Diagnostic Neuroimaging | Admitting: Diagnostic Neuroimaging

## 2015-01-07 DIAGNOSIS — R269 Unspecified abnormalities of gait and mobility: Secondary | ICD-10-CM

## 2015-01-07 DIAGNOSIS — R200 Anesthesia of skin: Secondary | ICD-10-CM

## 2015-01-07 DIAGNOSIS — R292 Abnormal reflex: Secondary | ICD-10-CM | POA: Diagnosis not present

## 2015-01-07 DIAGNOSIS — G252 Other specified forms of tremor: Secondary | ICD-10-CM

## 2015-01-07 DIAGNOSIS — R29898 Other symptoms and signs involving the musculoskeletal system: Secondary | ICD-10-CM

## 2015-01-09 ENCOUNTER — Non-Acute Institutional Stay (SKILLED_NURSING_FACILITY): Payer: Medicare Other | Admitting: Nurse Practitioner

## 2015-01-09 DIAGNOSIS — I159 Secondary hypertension, unspecified: Secondary | ICD-10-CM | POA: Diagnosis not present

## 2015-01-09 DIAGNOSIS — J432 Centrilobular emphysema: Secondary | ICD-10-CM

## 2015-01-09 DIAGNOSIS — L03115 Cellulitis of right lower limb: Secondary | ICD-10-CM | POA: Diagnosis not present

## 2015-01-09 DIAGNOSIS — E038 Other specified hypothyroidism: Secondary | ICD-10-CM

## 2015-01-09 DIAGNOSIS — G252 Other specified forms of tremor: Secondary | ICD-10-CM | POA: Diagnosis not present

## 2015-01-09 DIAGNOSIS — I5032 Chronic diastolic (congestive) heart failure: Secondary | ICD-10-CM | POA: Diagnosis not present

## 2015-01-09 DIAGNOSIS — F411 Generalized anxiety disorder: Secondary | ICD-10-CM | POA: Diagnosis not present

## 2015-01-09 DIAGNOSIS — E034 Atrophy of thyroid (acquired): Secondary | ICD-10-CM

## 2015-01-09 NOTE — Progress Notes (Deleted)
Patient ID: Dawn Montoya, female   DOB: [birth year], 81 y.o.   MRN: [id number]

## 2015-01-09 NOTE — Progress Notes (Signed)
Patient ID: Dawn Montoya, female   DOB: 01-14-1932, 81 y.o.   MRN: [id number]    Nursing Home Location:  [facility]   Place of Service: SNF (31)  PCP: Bartholome Bill, MD  No Known Allergies  Chief Complaint  Patient presents with  . Medical Management of Chronic Issues    HPI:  Patient is a 81 y.o. female seen today at [place] seen today for multiple concerns. Patient has a past medical history of COPD, HTN, diastolic CHF, and hypothyroidism.  Today she is seen in her room, feeling short of breath after walking from the dining hall.  She reports feeling well most of the time but cannot complete daily tasks without getting short of breath. Anxiety has improved on increase zoloft. Pt went to get MRI on 01/06/15 tolerated well due to dose of Ativan given. MRI ordered by neurology due to worsening gait and tremor.   Review of Systems:  Review of Systems  Constitutional: Negative for activity change, appetite change, fatigue and unexpected weight change.  HENT: Negative for congestion, hearing loss, postnasal drip, rhinorrhea, sinus pressure, sneezing, sore throat and trouble swallowing.   Eyes: Negative.   Respiratory: Positive for cough, shortness of breath and wheezing. Negative for stridor.   Cardiovascular: Negative for chest pain, palpitations and leg swelling.  Gastrointestinal: Negative for abdominal pain, diarrhea and constipation.  Genitourinary: Negative for dysuria, frequency and difficulty urinating.  Musculoskeletal: Negative for myalgias, arthralgias and gait problem.  Skin: Negative for wound. Color change: to bilateral LE.  Neurological: Negative for dizziness, weakness, light-headedness and headaches.  Psychiatric/Behavioral: Negative for behavioral problems, confusion and agitation. The patient is nervous/anxious.     Past Medical History  Diagnosis Date  . COPD (chronic obstructive pulmonary disease)   . Breast CA   .  Hypertension   . HTN (hypertension) 01/18/2014  . Hypothyroidism 01/18/2014  . Medical history non-contributory   . Shortness of breath   . Pneumonia   . Diastolic CHF 02/21/7000  . CKD (chronic kidney disease) stage 3, GFR 30-59 ml/min 12/15/2014   Past Surgical History  Procedure Laterality Date  . Cholecystectomy    . Breast surgery    . Mastectomy Left   . Esophagogastroduodenoscopy N/A 01/19/2014    Procedure: ESOPHAGOGASTRODUODENOSCOPY (EGD);  Surgeon: Jeryl Columbia, MD;  Location: [facility];  Service: Endoscopy;  Laterality: N/A;  . Colonoscopy N/A 01/20/2014    Procedure: COLONOSCOPY;  Surgeon: Winfield Cunas., MD;  Location: [facility];  Service: Endoscopy;  Laterality: N/A;   Social History:   reports that she quit smoking about 15 years ago. Her smoking use included Cigarettes. She has a 100 pack-year smoking history. She has never used smokeless tobacco. She reports that she does not drink alcohol or use illicit drugs.  Family History  Problem Relation Age of Onset  . Lung cancer Sister   . Breast cancer Sister   . Breast cancer Daughter     Medications: Patient's Medications  New Prescriptions   No medications on file  Previous Medications   ALBUTEROL (PROVENTIL HFA;VENTOLIN HFA) 108 (90 BASE) MCG/ACT INHALER    Inhale 2 puffs into the lungs every 6 (six) hours as needed for wheezing or shortness of breath.    ALBUTEROL (PROVENTIL) (2.5 MG/3ML) 0.083% NEBULIZER SOLUTION    Take 2.5 mg by nebulization every 6 (six) hours as needed for wheezing or shortness of breath.   BENZONATATE (TESSALON) 100 MG CAPSULE  Take 1 capsule (100 mg total) by mouth 3 (three) times daily as needed for cough.   BUDESONIDE (PULMICORT) 0.25 MG/2ML NEBULIZER SOLUTION    Take 0.25 mg by nebulization 2 (two) times daily.   BUSPIRONE (BUSPAR) 7.5 MG TABLET    Take 7.5 mg by mouth 3 (three) times daily.   CHOLECALCIFEROL (VITAMIN D) 1000 UNITS TABLET    Take 1,000 Units by mouth daily.    FLUTICASONE-SALMETEROL (ADVAIR) 250-50 MCG/DOSE AEPB    Inhale 1 puff into the lungs 2 (two) times daily.   FUROSEMIDE (LASIX) 40 MG TABLET    Take 1 tablet (40 mg total) by mouth daily.   HYDROCHLOROTHIAZIDE (HYDRODIURIL) 25 MG TABLET    Take 25 mg by mouth daily.   LEVOTHYROXINE (SYNTHROID, LEVOTHROID) 75 MCG TABLET    Take 75 mcg by mouth daily before breakfast.   LISINOPRIL (PRINIVIL,ZESTRIL) 10 MG TABLET    Take 1 tablet (10 mg total) by mouth daily.   MULTIPLE VITAMINS-MINERALS (MULTIVITAMIN WITH MINERALS) TABLET    Take 1 tablet by mouth daily.   OXYCODONE (OXY IR/ROXICODONE) 5 MG IMMEDIATE RELEASE TABLET    Take 1 tablet (5 mg total) by mouth every 4 (four) hours as needed for severe pain.   PANTOPRAZOLE (PROTONIX) 40 MG TABLET    Take 1 tablet (40 mg total) by mouth daily at 12 noon.   SERTRALINE (ZOLOFT) 50 MG TABLET    Take 1 tablet (50 mg total) by mouth daily.   TIOTROPIUM (SPIRIVA HANDIHALER) 18 MCG INHALATION CAPSULE    Place 1 capsule (18 mcg total) into inhaler and inhale daily.  Modified Medications   No medications on file  Discontinued Medications   No medications on file     Physical Exam: Filed Vitals:   01/09/15 1409  BP: 118/61  Pulse: 71  Temp: 97.2 F (36.2 C)  Resp: 20  Weight: 256 lb 12.8 oz (116.484 kg)    Physical Exam  Constitutional: She is oriented to person, place, and time. She appears well-developed and well-nourished. No distress.  HENT:  Head: Normocephalic and atraumatic.  Mouth/Throat: Oropharynx is clear and moist. No oropharyngeal exudate.  Eyes: Conjunctivae are normal. Pupils are equal, round, and reactive to light.  Neck: Normal range of motion. Neck supple.  Cardiovascular: Normal rate, regular rhythm and normal heart sounds.   Pulmonary/Chest: Effort normal and breath sounds normal. No respiratory distress. She has no wheezes.  Abdominal: Soft. Bowel sounds are normal.  Musculoskeletal: She exhibits edema (1+ edema to bilateral LE  ). She exhibits no tenderness.  Lymphadenopathy:    She has no cervical adenopathy.  Neurological: She is alert and oriented to person, place, and time.  Skin: Skin is warm and dry. No rash noted. She is not diaphoretic. No erythema ( ).  Psychiatric: Her mood appears anxious. Her speech is rapid and/or pressured.    Labs reviewed: Basic Metabolic Panel:  Recent Labs  01/18/14 2100  01/21/14 1655  10/28/14 0415 10/29/14 0400 10/30/14 0432 11/27/14  NA  --   < > 140  < > 135 135 141 137  K  --   < > 5.5*  < > 3.7 3.8 3.6 4.0  CL  --   < > 101  < > 94* 94* 96  --   CO2  --   < > 30  < > 34* 36* 38*  --   GLUCOSE  --   < > 131*  < > 134* 99 115*  --  BUN  --   < > 41*  < > 27* 26* 31* 25*  CREATININE  --   < > 1.07  < > 0.94 1.08 1.15* 0.9  CALCIUM  --   < > 8.8  < > 8.6 8.6 8.9  --   MG 2.2  --  3.7*  --  2.1  --   --   --   < > = values in this interval not displayed. Liver Function Tests:  Recent Labs  01/23/14 0355 10/24/14 2107 10/25/14 0235  AST 22 42* 35  ALT 25 40* 36*  ALKPHOS 53 91 82  BILITOT 0.5 1.0 0.6  PROT 5.9* 7.5 7.0  ALBUMIN 3.4* 3.8 3.5    Recent Labs  01/18/14 1532  LIPASE 32   No results for input(s): AMMONIA in the last 8760 hours. CBC:  Recent Labs  02/22/14 1130  10/24/14 2107 10/25/14 0235  10/27/14 0439 10/29/14 0400 10/30/14 0432  WBC 9.3  < > 14.3* 10.2  < > 12.0* 10.4 11.0*  NEUTROABS 7.3  --  10.4* 9.7*  --   --   --   --   HGB 11.1*  < > 13.3 12.0  < > 13.1 12.6 12.7  HCT 34.9*  < > 41.6 37.3  < > 42.4 42.4 42.0  MCV 98.3  < > 89.7 89.2  < > 92.0 93.2 93.3  PLT 411*  < > 270 215  < > 317 315 298  < > = values in this interval not displayed. TSH:  Recent Labs  01/18/14 2100 10/25/14 0235  TSH 3.197 1.718   A1C: Lab Results  Component Value Date   HGBA1C 6.5* 10/26/2014   Lipid Panel: No results for input(s): CHOL, HDL, LDLCALC, TRIG, CHOLHDL, LDLDIRECT in the last 8760 hours.  Basic Metabolic Panel     Result: 12/12/2014 6:07 PM   ( Status: F )       Sodium 136     135-145 mEq/L SLN   Potassium 5.0     3.5-5.3 mEq/L SLN   Chloride 97     96-112 mEq/L SLN   CO2 28     19-32 mEq/L SLN   Glucose 106   H 70-99 mg/dL SLN   BUN 21     6-23 mg/dL SLN   Creatinine 0.95     0.50-1.10 mg/dL SLN   Calcium 9.0  Assessment/Plan 1. Secondary hypertension, unspecified Blood pressure remains in desirable range. Maintained on lisinopril.  Last BMP in February.  Renal function stable.    2. Diastolic CHF, chronic Weight stable over several months. BNP in February was 74. Continue furosemide.     3. COPD Patient remains short of breath. She cannot walk to the bathroom in her room without feeling short of breath. She is seen by pulmonology.  Will increase her Advair to 500/50 1 puff bid.    4. Hypothyroidism due to acquired atrophy of thyroid TSH stable in January.  Will continue same dose of synthroid.   5.. Cellulitis of leg, right resolved.  Completed doxycycline regimen.  Right leg is not erythematous or painful.    6. Anxiety Patient reports that she is anxious, daughter with multiple health issues and  worsen by COPD. Will titrate off buspar and will start ativan 0.5mg  po q8 hours for anxiety.   Reviewed neurology note, in the future will consider titration off zoloft and to start Cymbalta for better neuropathy pain control   7. Action Tremor MRI  done and conts to be followed by neurology

## 2015-01-20 ENCOUNTER — Non-Acute Institutional Stay (SKILLED_NURSING_FACILITY): Payer: Medicare Other | Admitting: Internal Medicine

## 2015-01-20 DIAGNOSIS — I5032 Chronic diastolic (congestive) heart failure: Secondary | ICD-10-CM

## 2015-01-20 DIAGNOSIS — J441 Chronic obstructive pulmonary disease with (acute) exacerbation: Secondary | ICD-10-CM | POA: Diagnosis not present

## 2015-01-20 NOTE — Progress Notes (Signed)
MRN: [id number] Name: Dawn Montoya  Sex: female Age: 81 y.o. DOB: [birth year]  [place] #: [facility] Facility/Room:215 Level Of Care: SNF Provider: Inocencio Homes D Emergency Contacts: Extended Emergency Contact Information Primary Emergency Contact: [place] Phone: [phone] Mobile Phone: [phone] Relation: None  Code Status:   Allergies: Review of patient's allergies indicates no known allergies.  Chief Complaint  Patient presents with  . Acute Visit    HPI: Patient is 81 y.o. female who nursing asked me to see for low grade fever, cough and yellow sputum.  Past Medical History  Diagnosis Date  . COPD (chronic obstructive pulmonary disease)   . Breast CA   . Hypertension   . HTN (hypertension) 01/18/2014  . Hypothyroidism 01/18/2014  . Medical history non-contributory   . Shortness of breath   . Pneumonia   . Diastolic CHF 02/26/2562  . CKD (chronic kidney disease) stage 3, GFR 30-59 ml/min 12/15/2014    Past Surgical History  Procedure Laterality Date  . Cholecystectomy    . Breast surgery    . Mastectomy Left   . Esophagogastroduodenoscopy N/A 01/19/2014    Procedure: ESOPHAGOGASTRODUODENOSCOPY (EGD);  Surgeon: Jeryl Columbia, MD;  Location: [facility];  Service: Endoscopy;  Laterality: N/A;  . Colonoscopy N/A 01/20/2014    Procedure: COLONOSCOPY;  Surgeon: Winfield Cunas., MD;  Location: [facility];  Service: Endoscopy;  Laterality: N/A;      Medication List       This list is accurate as of: 01/20/15 11:59 PM.  Always use your most recent med list.               albuterol 108 (90 BASE) MCG/ACT inhaler  Commonly known as:  PROVENTIL HFA;VENTOLIN HFA  Inhale 2 puffs into the lungs every 6 (six) hours as needed for wheezing or shortness of breath.     albuterol (2.5 MG/3ML) 0.083% nebulizer solution  Commonly known as:  PROVENTIL  Take 2.5 mg by nebulization every 6 (six) hours as needed for wheezing or shortness  of breath.     benzonatate 100 MG capsule  Commonly known as:  TESSALON  Take 1 capsule (100 mg total) by mouth 3 (three) times daily as needed for cough.     budesonide 0.25 MG/2ML nebulizer solution  Commonly known as:  PULMICORT  Take 0.25 mg by nebulization 2 (two) times daily.     busPIRone 7.5 MG tablet  Commonly known as:  BUSPAR  Take 7.5 mg by mouth 3 (three) times daily.     cholecalciferol 1000 UNITS tablet  Commonly known as:  VITAMIN D  Take 1,000 Units by mouth daily.     Fluticasone-Salmeterol 250-50 MCG/DOSE Aepb  Commonly known as:  ADVAIR  Inhale 1 puff into the lungs 2 (two) times daily.     furosemide 40 MG tablet  Commonly known as:  LASIX  Take 1 tablet (40 mg total) by mouth daily.     hydrochlorothiazide 25 MG tablet  Commonly known as:  HYDRODIURIL  Take 25 mg by mouth daily.     levothyroxine 75 MCG tablet  Commonly known as:  SYNTHROID, LEVOTHROID  Take 75 mcg by mouth daily before breakfast.     lisinopril 10 MG tablet  Commonly known as:  PRINIVIL,ZESTRIL  Take 1 tablet (10 mg total) by mouth daily.     multivitamin with minerals tablet  Take 1 tablet by mouth daily.     oxyCODONE 5 MG immediate release tablet  Commonly known as:  Oxy IR/ROXICODONE  Take 1 tablet (5 mg total) by mouth every 4 (four) hours as needed for severe pain.     pantoprazole 40 MG tablet  Commonly known as:  PROTONIX  Take 1 tablet (40 mg total) by mouth daily at 12 noon.     sertraline 50 MG tablet  Commonly known as:  ZOLOFT  Take 1 tablet (50 mg total) by mouth daily.     tiotropium 18 MCG inhalation capsule  Commonly known as:  SPIRIVA HANDIHALER  Place 1 capsule (18 mcg total) into inhaler and inhale daily.        No orders of the defined types were placed in this encounter.    Immunization History  Administered Date(s) Administered  . Influenza Split 12/01/2014  . Influenza,inj,Quad PF,36+ Mos 01/21/2014    History  Substance Use Topics   . Smoking status: Former Smoker -- 2.00 packs/day for 50 years    Types: Cigarettes    Quit date: 10/25/1999  . Smokeless tobacco: Never Used  . Alcohol Use: No    Review of Systems  DATA OBTAINED: from patient, nurse GENERAL:  + low grade fever, fatigue, appetite changes SKIN: No itching, rash HEENT: No complaint RESPIRATORY: + cough, wheezing, +SOB CARDIAC: No chest pain, palpitations, lower extremity edema improved GI: No abdominal pain, No N/V/D or constipation, No heartburn or reflux  GU: No dysuria, frequency or urgency, or incontinence  MUSCULOSKELETAL: No unrelieved bone/joint pain NEUROLOGIC: No headache, dizziness  PSYCHIATRIC: No overt anxiety or sadness  Filed Vitals:   01/20/15 2014  BP: 144/68  Pulse: 85  Temp: 97.6 F (36.4 C)  Resp: 18    Physical Exam  GENERAL APPEARANCE: Alert, conversant  SKIN: No diaphoresis rash HEENT: Unremarkable RESPIRATORY: Breathing is even, unlabored. Lung sounds are decreased, slt tight, no wheezing  CARDIOVASCULAR: Heart RRR no murmurs, rubs or gallops. No peripheral edema  GASTROINTESTINAL: Abdomen is soft, non-tender, not distended w/ normal bowel sounds.  GENITOURINARY: Bladder non tender, not distended  MUSCULOSKELETAL: No abnormal joints or musculature NEUROLOGIC: Cranial nerves 2-12 grossly intact. Moves all extremities PSYCHIATRIC: Mood and affect appropriate to situation, no behavioral issues  Patient Active Problem List   Diagnosis Date Noted  . Solitary pulmonary nodule 01/01/2015  . Cellulitis of leg, right 12/15/2014  . CKD (chronic kidney disease) stage 3, GFR 30-59 ml/min 12/15/2014  . Carbon dioxide retention 12/15/2014  . Hyperglycemia 11/08/2014  . Acute on chronic diastolic heart failure 96/28/3662  . Uncontrolled hypertension 10/25/2014  . CHF (congestive heart failure) 10/24/2014  . Acute respiratory failure 02/23/2014  . Diastolic CHF, chronic 94/76/5465  . COPD (chronic obstructive pulmonary  disease) 01/18/2014  . Acute on chronic respiratory failure 01/18/2014  . COPD with acute exacerbation 01/18/2014  . Anemia 01/18/2014  . Melena 01/18/2014  . GIB (gastrointestinal bleeding) 01/18/2014  . Leukocytosis 01/18/2014  . HTN (hypertension) 01/18/2014  . Hypothyroidism 01/18/2014    CBC    Component Value Date/Time   WBC 11.0* 10/30/2014 0432   RBC 4.50 10/30/2014 0432   HGB 12.7 10/30/2014 0432   HCT 42.0 10/30/2014 0432   PLT 298 10/30/2014 0432   MCV 93.3 10/30/2014 0432   LYMPHSABS 0.4* 10/25/2014 0235   MONOABS 0.1 10/25/2014 0235   EOSABS 0.0 10/25/2014 0235   BASOSABS 0.0 10/25/2014 0235    CMP     Component Value Date/Time   NA 137 11/27/2014   NA 141 10/30/2014 0432   K 4.0 11/27/2014  CL 96 10/30/2014 0432   CO2 38* 10/30/2014 0432   GLUCOSE 115* 10/30/2014 0432   BUN 25* 11/27/2014   BUN 31* 10/30/2014 0432   CREATININE 0.9 11/27/2014   CREATININE 1.15* 10/30/2014 0432   CALCIUM 8.9 10/30/2014 0432   PROT 7.0 10/25/2014 0235   ALBUMIN 3.5 10/25/2014 0235   AST 35 10/25/2014 0235   ALT 36* 10/25/2014 0235   ALKPHOS 82 10/25/2014 0235   BILITOT 0.6 10/25/2014 0235   GFRNONAA 44* 10/30/2014 0432   GFRAA 51* 10/30/2014 0432    Assessment and Plan  COPD with acute exacerbation Low grade fever, cough, yellow sputum, O2 sat 94% which is lower than her usual;will tx with Augmentin 875 mg BID for 7 days for PNA or bronchitis, will order CXR; albuterol nebs q4 scheduled for a week; prednisone taper.   Diastolic CHF, chronic Pt's edema per pt has improved with lasix 80 mg daily     Hennie Duos, MD

## 2015-01-26 ENCOUNTER — Encounter: Payer: Self-pay | Admitting: Internal Medicine

## 2015-01-26 NOTE — Assessment & Plan Note (Signed)
Low grade fever, cough, yellow sputum, O2 sat 94% which is lower than her usual;will tx with Augmentin 875 mg BID for 7 days for PNA or bronchitis, will order CXR; albuterol nebs q4 scheduled for a week; prednisone taper.

## 2015-01-26 NOTE — Assessment & Plan Note (Signed)
Pt's edema per pt has improved with lasix 80 mg daily

## 2015-02-19 ENCOUNTER — Telehealth: Payer: Self-pay | Admitting: Diagnostic Neuroimaging

## 2015-02-19 NOTE — Telephone Encounter (Signed)
I called patient re: MRI results. No answer. Will setup closer follow up appt. Patient not a good surgical candidate for meningioma and cervical spinal stenosis issues found on MRI. Likely will continue with conservative mgmt. -VRP

## 2015-02-20 NOTE — Telephone Encounter (Signed)
Spoke with granddaughter on the phone and was able to get an appt for 02/22/15. Told her that Dr. Leta Baptist wanted to talk about the MRI results and she asked me to have him call her on her cell phone. Will relay this message

## 2015-02-25 ENCOUNTER — Encounter: Payer: Self-pay | Admitting: Nurse Practitioner

## 2015-02-25 ENCOUNTER — Non-Acute Institutional Stay (SKILLED_NURSING_FACILITY): Payer: Medicare Other | Admitting: Nurse Practitioner

## 2015-02-25 DIAGNOSIS — N183 Chronic kidney disease, stage 3 unspecified: Secondary | ICD-10-CM

## 2015-02-25 DIAGNOSIS — D649 Anemia, unspecified: Secondary | ICD-10-CM | POA: Diagnosis not present

## 2015-02-25 DIAGNOSIS — R300 Dysuria: Secondary | ICD-10-CM

## 2015-02-25 DIAGNOSIS — I5032 Chronic diastolic (congestive) heart failure: Secondary | ICD-10-CM

## 2015-02-25 DIAGNOSIS — F411 Generalized anxiety disorder: Secondary | ICD-10-CM | POA: Insufficient documentation

## 2015-02-25 DIAGNOSIS — J432 Centrilobular emphysema: Secondary | ICD-10-CM

## 2015-02-25 DIAGNOSIS — I10 Essential (primary) hypertension: Secondary | ICD-10-CM

## 2015-02-25 HISTORY — DX: Generalized anxiety disorder: F41.1

## 2015-02-25 MED ORDER — FLUTICASONE-SALMETEROL 500-50 MCG/DOSE IN AEPB: 1.0000 | INHALATION_SPRAY | Freq: Two times a day (BID) | RESPIRATORY_TRACT | Status: DC

## 2015-02-25 NOTE — Progress Notes (Signed)
Patient ID: Dawn Montoya, female   DOB: 1932/02/25, 81 y.o.   MRN: [id number]    Nursing Home Location:  [facility]   Place of Service: SNF (31)  PCP: Bartholome Bill, MD  No Known Allergies  No chief complaint on file.   HPI:  Patient is a 81 y.o. female seen today at [place] seen today for multiple concerns. Patient has a past medical history of COPD, HTN, diastolic CHF, and hypothyroidism.  Pt with ongoing cough, congestion and shortness of breath. Reports overall shortness of breath has not improved. Recently with increased in cough and congestion that has somewhat improved but still there. No fevers or chills.   Review of Systems:  Review of Systems  Constitutional: Negative for activity change, appetite change, fatigue and unexpected weight change.  HENT: Negative for congestion, hearing loss, postnasal drip, rhinorrhea, sinus pressure, sneezing, sore throat and trouble swallowing.   Eyes: Negative.   Respiratory: Positive for cough and shortness of breath. Negative for stridor.   Cardiovascular: Positive for leg swelling. Negative for chest pain and palpitations.  Gastrointestinal: Negative for abdominal pain, diarrhea and constipation.  Genitourinary: Positive for dysuria. Negative for frequency and difficulty urinating.  Musculoskeletal: Negative for myalgias, arthralgias and gait problem.  Skin: Negative for wound. Color change: to bilateral LE.  Neurological: Positive for tremors. Negative for dizziness, weakness, light-headedness and headaches.  Psychiatric/Behavioral: Negative for behavioral problems, confusion and agitation. The patient is nervous/anxious (has improved with medications).     Past Medical History  Diagnosis Date  . COPD (chronic obstructive pulmonary disease)   . Breast CA   . Hypertension   . HTN (hypertension) 01/18/2014  . Hypothyroidism 01/18/2014  . Medical history non-contributory   . Shortness of  breath   . Pneumonia   . Diastolic CHF 11/28/537  . CKD (chronic kidney disease) stage 3, GFR 30-59 ml/min 12/15/2014  . Anxiety state 02/25/2015   Past Surgical History  Procedure Laterality Date  . Cholecystectomy    . Breast surgery    . Mastectomy Left   . Esophagogastroduodenoscopy N/A 01/19/2014    Procedure: ESOPHAGOGASTRODUODENOSCOPY (EGD);  Surgeon: Jeryl Columbia, MD;  Location: [facility];  Service: Endoscopy;  Laterality: N/A;  . Colonoscopy N/A 01/20/2014    Procedure: COLONOSCOPY;  Surgeon: Winfield Cunas., MD;  Location: [facility];  Service: Endoscopy;  Laterality: N/A;   Social History:   reports that she quit smoking about 15 years ago. Her smoking use included Cigarettes. She has a 100 pack-year smoking history. She has never used smokeless tobacco. She reports that she does not drink alcohol or use illicit drugs.  Family History  Problem Relation Age of Onset  . Lung cancer Sister   . Breast cancer Sister   . Breast cancer Daughter     Medications: Patient's Medications  New Prescriptions   FLUTICASONE-SALMETEROL (ADVAIR DISKUS) 500-50 MCG/DOSE AEPB    Inhale 1 puff into the lungs 2 (two) times daily.  Previous Medications   ALBUTEROL (PROVENTIL HFA;VENTOLIN HFA) 108 (90 BASE) MCG/ACT INHALER    Inhale 2 puffs into the lungs every 6 (six) hours as needed for wheezing or shortness of breath.    ALBUTEROL (PROVENTIL) (2.5 MG/3ML) 0.083% NEBULIZER SOLUTION    Take 2.5 mg by nebulization every 6 (six) hours as needed for wheezing or shortness of breath.   BENZONATATE (TESSALON) 100 MG CAPSULE    Take 1 capsule (100 mg total) by mouth 3 (three) times  daily as needed for cough.   BUDESONIDE (PULMICORT) 0.25 MG/2ML NEBULIZER SOLUTION    Take 0.25 mg by nebulization 2 (two) times daily.   BUSPIRONE (BUSPAR) 7.5 MG TABLET    Take 7.5 mg by mouth 3 (three) times daily.   CHOLECALCIFEROL (VITAMIN D) 1000 UNITS TABLET    Take 1,000 Units by mouth daily.   FUROSEMIDE  (LASIX) 40 MG TABLET    Take 1 tablet (40 mg total) by mouth daily.   HYDROCHLOROTHIAZIDE (HYDRODIURIL) 25 MG TABLET    Take 25 mg by mouth daily.   LEVOTHYROXINE (SYNTHROID, LEVOTHROID) 75 MCG TABLET    Take 75 mcg by mouth daily before breakfast.   LISINOPRIL (PRINIVIL,ZESTRIL) 10 MG TABLET    Take 1 tablet (10 mg total) by mouth daily.   MULTIPLE VITAMINS-MINERALS (MULTIVITAMIN WITH MINERALS) TABLET    Take 1 tablet by mouth daily.   OXYCODONE (OXY IR/ROXICODONE) 5 MG IMMEDIATE RELEASE TABLET    Take 1 tablet (5 mg total) by mouth every 4 (four) hours as needed for severe pain.   PANTOPRAZOLE (PROTONIX) 40 MG TABLET    Take 1 tablet (40 mg total) by mouth daily at 12 noon.   SERTRALINE (ZOLOFT) 50 MG TABLET    Take 1 tablet (50 mg total) by mouth daily.   TIOTROPIUM (SPIRIVA HANDIHALER) 18 MCG INHALATION CAPSULE    Place 1 capsule (18 mcg total) into inhaler and inhale daily.  Modified Medications   No medications on file  Discontinued Medications   FLUTICASONE-SALMETEROL (ADVAIR) 250-50 MCG/DOSE AEPB    Inhale 1 puff into the lungs 2 (two) times daily.     Physical Exam: Filed Vitals:   02/25/15 1211  BP: 130/70  Pulse: 74  Temp: 98 F (36.7 C)  Resp: 20  Weight: 255 lb (115.667 kg)    Physical Exam  Constitutional: She is oriented to person, place, and time. She appears well-developed and well-nourished. No distress.  HENT:  Head: Normocephalic and atraumatic.  Mouth/Throat: Oropharynx is clear and moist. No oropharyngeal exudate.  Eyes: Conjunctivae are normal. Pupils are equal, round, and reactive to light.  Neck: Normal range of motion. Neck supple.  Cardiovascular: Normal rate, regular rhythm and normal heart sounds.   Pulmonary/Chest: Effort normal and breath sounds normal. No respiratory distress. She has no wheezes.  Abdominal: Soft. Bowel sounds are normal.  Musculoskeletal: She exhibits edema (1+ edema to bilateral LE ). She exhibits no tenderness.    Lymphadenopathy:    She has no cervical adenopathy.  Neurological: She is alert and oriented to person, place, and time.  Skin: Skin is warm and dry. No rash noted. She is not diaphoretic. No erythema.  Psychiatric: She has a normal mood and affect.    Labs reviewed: Basic Metabolic Panel:  Recent Labs  10/28/14 0415 10/29/14 0400 10/30/14 0432 11/27/14  NA 135 135 141 137  K 3.7 3.8 3.6 4.0  CL 94* 94* 96  --   CO2 34* 36* 38*  --   GLUCOSE 134* 99 115*  --   BUN 27* 26* 31* 25*  CREATININE 0.94 1.08 1.15* 0.9  CALCIUM 8.6 8.6 8.9  --   MG 2.1  --   --   --    Liver Function Tests:  Recent Labs  10/24/14 2107 10/25/14 0235  AST 42* 35  ALT 40* 36*  ALKPHOS 91 82  BILITOT 1.0 0.6  PROT 7.5 7.0  ALBUMIN 3.8 3.5   No results for input(s): LIPASE, AMYLASE  in the last 8760 hours. No results for input(s): AMMONIA in the last 8760 hours. CBC:  Recent Labs  10/24/14 2107 10/25/14 0235  10/27/14 0439 10/29/14 0400 10/30/14 0432  WBC 14.3* 10.2  < > 12.0* 10.4 11.0*  NEUTROABS 10.4* 9.7*  --   --   --   --   HGB 13.3 12.0  < > 13.1 12.6 12.7  HCT 41.6 37.3  < > 42.4 42.4 42.0  MCV 89.7 89.2  < > 92.0 93.2 93.3  PLT 270 215  < > 317 315 298  < > = values in this interval not displayed. TSH:  Recent Labs  10/25/14 0235  TSH 1.718   A1C: Lab Results  Component Value Date   HGBA1C 6.5* 10/26/2014   Lipid Panel: No results for input(s): CHOL, HDL, LDLCALC, TRIG, CHOLHDL, LDLDIRECT in the last 8760 hours.  Basic Metabolic Panel    Result: 12/12/2014 6:07 PM   ( Status: F )       Sodium 136     135-145 mEq/L SLN   Potassium 5.0     3.5-5.3 mEq/L SLN   Chloride 97     96-112 mEq/L SLN   CO2 28     19-32 mEq/L SLN   Glucose 106   H 70-99 mg/dL SLN   BUN 21     6-23 mg/dL SLN   Creatinine 0.95     0.50-1.10 mg/dL SLN   Calcium 9.0  Assessment/Plan  1. Diastolic CHF, chronic -maintained on lasix 80 mg daily and lisinopril, weight has been stable  2.  Essential hypertension - blood pressure well controlled on current regimen   3. Centrilobular emphysema -long hx of COPD, has previously been seen by pulmonary but missed follow up appt, will have staff schedule at this time due to ongoing worsening shortness of breath, cough and congestion -will also get chest xray to evaluate for acute process   4. CKD (chronic kidney disease) stage 3, GFR 30-59 ml/min -follow up bmp  5. Anemia, unspecified anemia type Will follow up cbc  6. Anxiety state Improved on zoloft, still has anxiety but much better, will cont at this time   7. Dysuria -reports pain after urination that goes through body, will get UA &S

## 2015-02-28 LAB — BASIC METABOLIC PANEL
BUN: 20 mg/dL (ref 4–21)
Creatinine: 0.8 mg/dL (ref 0.5–1.1)
Glucose: 109 mg/dL
Potassium: 4.4 mmol/L (ref 3.4–5.3)
SODIUM: 139 mmol/L (ref 137–147)

## 2015-03-03 ENCOUNTER — Ambulatory Visit: Payer: Self-pay | Admitting: Diagnostic Neuroimaging

## 2015-03-04 ENCOUNTER — Encounter: Payer: Self-pay | Admitting: Diagnostic Neuroimaging

## 2015-03-18 ENCOUNTER — Ambulatory Visit: Payer: Self-pay | Admitting: Diagnostic Neuroimaging

## 2015-03-27 ENCOUNTER — Non-Acute Institutional Stay (SKILLED_NURSING_FACILITY): Payer: Medicare Other | Admitting: Nurse Practitioner

## 2015-03-27 DIAGNOSIS — E038 Other specified hypothyroidism: Secondary | ICD-10-CM | POA: Diagnosis not present

## 2015-03-27 DIAGNOSIS — M4712 Other spondylosis with myelopathy, cervical region: Secondary | ICD-10-CM | POA: Diagnosis not present

## 2015-03-27 DIAGNOSIS — M5412 Radiculopathy, cervical region: Secondary | ICD-10-CM

## 2015-03-27 DIAGNOSIS — E034 Atrophy of thyroid (acquired): Secondary | ICD-10-CM

## 2015-03-27 DIAGNOSIS — J432 Centrilobular emphysema: Secondary | ICD-10-CM

## 2015-03-27 DIAGNOSIS — F411 Generalized anxiety disorder: Secondary | ICD-10-CM

## 2015-03-27 DIAGNOSIS — I10 Essential (primary) hypertension: Secondary | ICD-10-CM

## 2015-03-27 DIAGNOSIS — I5032 Chronic diastolic (congestive) heart failure: Secondary | ICD-10-CM | POA: Diagnosis not present

## 2015-03-27 NOTE — Progress Notes (Signed)
Patient ID: Dawn Montoya, female   DOB: [birth year], 81 y.o.   MRN: [id number]    Nursing Home Location:  [facility]   Place of Service: SNF (31)  PCP: Bartholome Bill, MD  No Known Allergies  Chief Complaint  Patient presents with  . Medical Management of Chronic Issues    HPI:  Patient is a 81 y.o. female seen today at [place] seen today for routine follow up on chronic conditions. Patient has a past medical history of COPD, HTN, diastolic CHF, and hypothyroidism.  Awaiting neurology followup, problem with insurance but currently being resolved. Pt reports cough and congestion from last month has improved.  Still with radiating symptoms when she urinates, urology consult has been ordered for further evaluation, UA C&Swas negative. No new concerns at this time.   Review of Systems:  Review of Systems  Constitutional: Negative for activity change, appetite change, fatigue and unexpected weight change.  HENT: Negative for congestion, hearing loss, postnasal drip, rhinorrhea, sinus pressure, sneezing, sore throat and trouble swallowing.   Eyes: Negative.   Respiratory: Negative for cough, shortness of breath and stridor.        Chronic shortness of breath due to COPD, without worsening of symptoms   Cardiovascular: Positive for leg swelling. Negative for chest pain and palpitations.  Gastrointestinal: Negative for abdominal pain, diarrhea and constipation.  Genitourinary: Negative for dysuria, frequency and difficulty urinating.  Musculoskeletal: Negative for myalgias, arthralgias and gait problem.  Skin: Negative for wound.  Neurological: Positive for tremors and numbness (to bilateral upper extermities). Negative for dizziness, weakness, light-headedness and headaches.  Psychiatric/Behavioral: Negative for behavioral problems, confusion and agitation. The patient is nervous/anxious (has improved with medications).     Past Medical History    Diagnosis Date  . COPD (chronic obstructive pulmonary disease)   . Breast CA   . Hypertension   . HTN (hypertension) 01/18/2014  . Hypothyroidism 01/18/2014  . Medical history non-contributory   . Shortness of breath   . Pneumonia   . Diastolic CHF 04/30/2422  . CKD (chronic kidney disease) stage 3, GFR 30-59 ml/min 12/15/2014  . Anxiety state 02/25/2015   Past Surgical History  Procedure Laterality Date  . Cholecystectomy    . Breast surgery    . Mastectomy Left   . Esophagogastroduodenoscopy N/A 01/19/2014    Procedure: ESOPHAGOGASTRODUODENOSCOPY (EGD);  Surgeon: Jeryl Columbia, MD;  Location: [facility];  Service: Endoscopy;  Laterality: N/A;  . Colonoscopy N/A 01/20/2014    Procedure: COLONOSCOPY;  Surgeon: Winfield Cunas., MD;  Location: [facility];  Service: Endoscopy;  Laterality: N/A;   Social History:   reports that she quit smoking about 15 years ago. Her smoking use included Cigarettes. She has a 100 pack-year smoking history. She has never used smokeless tobacco. She reports that she does not drink alcohol or use illicit drugs.  Family History  Problem Relation Age of Onset  . Lung cancer Sister   . Breast cancer Sister   . Breast cancer Daughter     Medications: Patient's Medications  New Prescriptions   No medications on file  Previous Medications   ALBUTEROL (PROVENTIL HFA;VENTOLIN HFA) 108 (90 BASE) MCG/ACT INHALER    Inhale 2 puffs into the lungs every 6 (six) hours as needed for wheezing or shortness of breath.    ALBUTEROL (PROVENTIL) (2.5 MG/3ML) 0.083% NEBULIZER SOLUTION    Take 2.5 mg by nebulization every 6 (six) hours as needed for wheezing or  shortness of breath.   BENZONATATE (TESSALON) 100 MG CAPSULE    Take 1 capsule (100 mg total) by mouth 3 (three) times daily as needed for cough.   BUDESONIDE (PULMICORT) 0.25 MG/2ML NEBULIZER SOLUTION    Take 0.25 mg by nebulization 2 (two) times daily.   BUSPIRONE (BUSPAR) 7.5 MG TABLET    Take 7.5 mg by mouth  3 (three) times daily.   CHOLECALCIFEROL (VITAMIN D) 1000 UNITS TABLET    Take 1,000 Units by mouth daily.   FLUTICASONE-SALMETEROL (ADVAIR DISKUS) 500-50 MCG/DOSE AEPB    Inhale 1 puff into the lungs 2 (two) times daily.   FUROSEMIDE (LASIX) 40 MG TABLET    Take 1 tablet (40 mg total) by mouth daily.   HYDROCHLOROTHIAZIDE (HYDRODIURIL) 25 MG TABLET    Take 25 mg by mouth daily.   LEVOTHYROXINE (SYNTHROID, LEVOTHROID) 75 MCG TABLET    Take 75 mcg by mouth daily before breakfast.   LISINOPRIL (PRINIVIL,ZESTRIL) 10 MG TABLET    Take 1 tablet (10 mg total) by mouth daily.   MULTIPLE VITAMINS-MINERALS (MULTIVITAMIN WITH MINERALS) TABLET    Take 1 tablet by mouth daily.   OXYCODONE (OXY IR/ROXICODONE) 5 MG IMMEDIATE RELEASE TABLET    Take 1 tablet (5 mg total) by mouth every 4 (four) hours as needed for severe pain.   PANTOPRAZOLE (PROTONIX) 40 MG TABLET    Take 1 tablet (40 mg total) by mouth daily at 12 noon.   SERTRALINE (ZOLOFT) 50 MG TABLET    Take 1 tablet (50 mg total) by mouth daily.   TIOTROPIUM (SPIRIVA HANDIHALER) 18 MCG INHALATION CAPSULE    Place 1 capsule (18 mcg total) into inhaler and inhale daily.  Modified Medications   No medications on file  Discontinued Medications   No medications on file     Physical Exam: Filed Vitals:   03/27/15 1514  BP: 136/76  Pulse: 69  Temp: 98.7 F (37.1 C)  Resp: 20    Physical Exam  Constitutional: She is oriented to person, place, and time. She appears well-developed and well-nourished. No distress.  HENT:  Head: Normocephalic and atraumatic.  Mouth/Throat: Oropharynx is clear and moist. No oropharyngeal exudate.  Eyes: Conjunctivae are normal. Pupils are equal, round, and reactive to light.  Neck: Normal range of motion. Neck supple.  Cardiovascular: Normal rate, regular rhythm and normal heart sounds.   Pulmonary/Chest: Effort normal and breath sounds normal. No respiratory distress. She has no wheezes.  Abdominal: Soft. Bowel  sounds are normal.  Musculoskeletal: She exhibits edema (1+ edema to bilateral LE ). She exhibits no tenderness.  Lymphadenopathy:    She has no cervical adenopathy.  Neurological: She is alert and oriented to person, place, and time.  Skin: Skin is warm and dry. No rash noted. She is not diaphoretic. No erythema.  Psychiatric: She has a normal mood and affect.    Labs reviewed: Basic Metabolic Panel:  Recent Labs  10/28/14 0415 10/29/14 0400 10/30/14 0432 11/27/14  NA 135 135 141 137  K 3.7 3.8 3.6 4.0  CL 94* 94* 96  --   CO2 34* 36* 38*  --   GLUCOSE 134* 99 115*  --   BUN 27* 26* 31* 25*  CREATININE 0.94 1.08 1.15* 0.9  CALCIUM 8.6 8.6 8.9  --   MG 2.1  --   --   --    Liver Function Tests:  Recent Labs  10/24/14 2107 10/25/14 0235  AST 42* 35  ALT 40* 36*  ALKPHOS 91 82  BILITOT 1.0 0.6  PROT 7.5 7.0  ALBUMIN 3.8 3.5   No results for input(s): LIPASE, AMYLASE in the last 8760 hours. No results for input(s): AMMONIA in the last 8760 hours. CBC:  Recent Labs  10/24/14 2107 10/25/14 0235  10/27/14 0439 10/29/14 0400 10/30/14 0432  WBC 14.3* 10.2  < > 12.0* 10.4 11.0*  NEUTROABS 10.4* 9.7*  --   --   --   --   HGB 13.3 12.0  < > 13.1 12.6 12.7  HCT 41.6 37.3  < > 42.4 42.4 42.0  MCV 89.7 89.2  < > 92.0 93.2 93.3  PLT 270 215  < > 317 315 298  < > = values in this interval not displayed. TSH:  Recent Labs  10/25/14 0235  TSH 1.718   A1C: Lab Results  Component Value Date   HGBA1C 6.5* 10/26/2014   Lipid Panel: No results for input(s): CHOL, HDL, LDLCALC, TRIG, CHOLHDL, LDLDIRECT in the last 8760 hours.  Basic Metabolic Panel    Result: 12/12/2014 6:07 PM   ( Status: F )       Sodium 136     135-145 mEq/L SLN   Potassium 5.0     3.5-5.3 mEq/L SLN   Chloride 97     96-112 mEq/L SLN   CO2 28     19-32 mEq/L SLN   Glucose 106   H 70-99 mg/dL SLN   BUN 21     6-23 mg/dL SLN   Creatinine 0.95     0.50-1.10 mg/dL SLN    Calcium 9.0  Assessment/Plan  1. Essential hypertension Controlled on current regimen  2. Diastolic CHF, chronic Stable at this time, conts on lasix and lisinopril  3. Centrilobular emphysema COPD remains stable at this time.   4. Hypothyroidism due to acquired atrophy of thyroid TSH stable in January, remains on synthroid 75 mcg  5. Anxiety state Improved controlled on zoloft however due to due to myelopahty and radiculopathy with titrate zoloft off and start cymbalta to help with pain   6. Cervical myelopathy with cervical radiculopathy Following with neurology, will start cymbalta to hopefully improve symptoms.

## 2015-03-29 MED ORDER — DULOXETINE HCL 60 MG PO CPEP: 60.0000 mg | ORAL_CAPSULE | Freq: Every day | ORAL | Status: DC

## 2015-03-29 MED ORDER — FUROSEMIDE 40 MG PO TABS: 80.0000 mg | ORAL_TABLET | Freq: Every day | ORAL | Status: DC

## 2015-04-09 ENCOUNTER — Non-Acute Institutional Stay (SKILLED_NURSING_FACILITY): Payer: Medicare Other | Admitting: Internal Medicine

## 2015-04-09 DIAGNOSIS — Z789 Other specified health status: Secondary | ICD-10-CM

## 2015-04-09 NOTE — Progress Notes (Signed)
MRN: [id number] Name: Dawn Montoya  Sex: female Age: 81 y.o. DOB: May 31, 1932  [place] #: Helene Kelp Facility/Room:227 Level Of Care: SNF Provider: Inocencio Homes D Emergency Contacts: Extended Emergency Contact Information Primary Emergency Contact: [place] [facility] [place] Phone: [phone] Mobile Phone: [phone] Relation: None  Code Status: FULL  Allergies: Review of patient's allergies indicates no known allergies.  Chief Complaint  Patient presents with  . Acute Visit    HPI: Patient is 81 y.o. female who nursing asked me to see because she is sick in bed today.  Past Medical History  Diagnosis Date  . COPD (chronic obstructive pulmonary disease)   . Breast CA   . Hypertension   . HTN (hypertension) 01/18/2014  . Hypothyroidism 01/18/2014  . Medical history non-contributory   . Shortness of breath   . Pneumonia   . Diastolic CHF 05/25/5052  . CKD (chronic kidney disease) stage 3, GFR 30-59 ml/min 12/15/2014  . Anxiety state 02/25/2015    Past Surgical History  Procedure Laterality Date  . Cholecystectomy    . Breast surgery    . Mastectomy Left   . Esophagogastroduodenoscopy N/A 01/19/2014    Procedure: ESOPHAGOGASTRODUODENOSCOPY (EGD);  Surgeon: Jeryl Columbia, MD;  Location: [facility];  Service: Endoscopy;  Laterality: N/A;  . Colonoscopy N/A 01/20/2014    Procedure: COLONOSCOPY;  Surgeon: Winfield Cunas., MD;  Location: [facility];  Service: Endoscopy;  Laterality: N/A;      Medication List       This list is accurate as of: 04/09/15 11:59 PM.  Always use your most recent med list.               albuterol 108 (90 BASE) MCG/ACT inhaler  Commonly known as:  PROVENTIL HFA;VENTOLIN HFA  Inhale 2 puffs into the lungs every 6 (six) hours as needed for wheezing or shortness of breath.     albuterol (2.5 MG/3ML) 0.083% nebulizer solution  Commonly known as:  PROVENTIL  Take 2.5 mg by nebulization every 6 (six) hours as needed for  wheezing or shortness of breath.     benzonatate 100 MG capsule  Commonly known as:  TESSALON  Take 1 capsule (100 mg total) by mouth 3 (three) times daily as needed for cough.     budesonide 0.25 MG/2ML nebulizer solution  Commonly known as:  PULMICORT  Take 0.25 mg by nebulization 2 (two) times daily.     busPIRone 7.5 MG tablet  Commonly known as:  BUSPAR  Take 7.5 mg by mouth 3 (three) times daily.     cholecalciferol 1000 UNITS tablet  Commonly known as:  VITAMIN D  Take 1,000 Units by mouth daily.     DULoxetine 60 MG capsule  Commonly known as:  CYMBALTA  Take 1 capsule (60 mg total) by mouth daily.     Fluticasone-Salmeterol 500-50 MCG/DOSE Aepb  Commonly known as:  ADVAIR DISKUS  Inhale 1 puff into the lungs 2 (two) times daily.     furosemide 40 MG tablet  Commonly known as:  LASIX  Take 2 tablets (80 mg total) by mouth daily.     hydrochlorothiazide 25 MG tablet  Commonly known as:  HYDRODIURIL  Take 25 mg by mouth daily.     levothyroxine 75 MCG tablet  Commonly known as:  SYNTHROID, LEVOTHROID  Take 75 mcg by mouth daily before breakfast.     lisinopril 10 MG tablet  Commonly known as:  PRINIVIL,ZESTRIL  Take 1 tablet (10 mg  total) by mouth daily.     multivitamin with minerals tablet  Take 1 tablet by mouth daily.     oxyCODONE 5 MG immediate release tablet  Commonly known as:  Oxy IR/ROXICODONE  Take 1 tablet (5 mg total) by mouth every 4 (four) hours as needed for severe pain.     pantoprazole 40 MG tablet  Commonly known as:  PROTONIX  Take 1 tablet (40 mg total) by mouth daily at 12 noon.     sertraline 50 MG tablet  Commonly known as:  ZOLOFT  Take 1 tablet (50 mg total) by mouth daily.     tiotropium 18 MCG inhalation capsule  Commonly known as:  SPIRIVA HANDIHALER  Place 1 capsule (18 mcg total) into inhaler and inhale daily.        No orders of the defined types were placed in this encounter.    Immunization History   Administered Date(s) Administered  . Influenza Split 12/01/2014  . Influenza,inj,Quad PF,36+ Mos 01/21/2014    History  Substance Use Topics  . Smoking status: Former Smoker -- 2.00 packs/day for 50 years    Types: Cigarettes    Quit date: 10/25/1999  . Smokeless tobacco: Never Used  . Alcohol Use: No    Review of Systems  DATA OBTAINED: from patient, nurse GENERAL:  no fevers, +fatigue SKIN: No itching, rash HEENT: + sore throat and congestion RESPIRATORY:  No cough, wheezing, SOB CARDIAC: No chest pain, palpitations, lower extremity edema  GI: No abdominal pain, No N/V/D or constipation, No heartburn or reflux  GU: No dysuria, frequency or urgency, or incontinence  MUSCULOSKELETAL: No unrelieved bone/joint pain NEUROLOGIC: No headache, dizziness  PSYCHIATRIC: No overt anxiety or sadness  Filed Vitals:   04/11/15 1604  BP: 101/79  Pulse: 79  Temp: 97.2 F (36.2 C)  Resp: 19    Physical Exam  GENERAL APPEARANCE: Alert, conversant, obese WF, c/o illness with good energy SKIN: No diaphoresis rash, or wounds HEENT: no swelling or redness to tonsils or uvula, no PND RESPIRATORY: Breathing is even, unlabored. Lung sounds with wheezing but good air flow, no rales   CARDIOVASCULAR: Heart RRR no murmurs, rubs or gallops. 1+ peripheral edema  GASTROINTESTINAL: Abdomen is soft, non-tender, not distended w/ normal bowel sounds.  GENITOURINARY: Bladder non tender, not distended  MUSCULOSKELETAL: No abnormal joints or musculature NEUROLOGIC: Cranial nerves 2-12 grossly intact. Moves all extremities PSYCHIATRIC: Mood and affect appropriate to situation, no behavioral issues  Patient Active Problem List   Diagnosis Date Noted  . Acute medical illness 04/11/2015  . Anxiety state 02/25/2015  . Solitary pulmonary nodule 01/01/2015  . Cellulitis of leg, right 12/15/2014  . CKD (chronic kidney disease) stage 3, GFR 30-59 ml/min 12/15/2014  . Carbon dioxide retention 12/15/2014   . Hyperglycemia 11/08/2014  . Acute on chronic diastolic heart failure 96/22/2979  . Uncontrolled hypertension 10/25/2014  . CHF (congestive heart failure) 10/24/2014  . Acute respiratory failure 02/23/2014  . Diastolic CHF, chronic 89/21/1941  . COPD (chronic obstructive pulmonary disease) 01/18/2014  . Acute on chronic respiratory failure 01/18/2014  . COPD with acute exacerbation 01/18/2014  . Anemia 01/18/2014  . Melena 01/18/2014  . GIB (gastrointestinal bleeding) 01/18/2014  . Leukocytosis 01/18/2014  . HTN (hypertension) 01/18/2014  . Hypothyroidism 01/18/2014    CBC    Component Value Date/Time   WBC 11.0* 10/30/2014 0432   RBC 4.50 10/30/2014 0432   HGB 12.7 10/30/2014 0432   HCT 42.0 10/30/2014 0432   PLT  298 10/30/2014 0432   MCV 93.3 10/30/2014 0432   LYMPHSABS 0.4* 10/25/2014 0235   MONOABS 0.1 10/25/2014 0235   EOSABS 0.0 10/25/2014 0235   BASOSABS 0.0 10/25/2014 0235    CMP     Component Value Date/Time   NA 137 11/27/2014   NA 141 10/30/2014 0432   K 4.0 11/27/2014   CL 96 10/30/2014 0432   CO2 38* 10/30/2014 0432   GLUCOSE 115* 10/30/2014 0432   BUN 25* 11/27/2014   BUN 31* 10/30/2014 0432   CREATININE 0.9 11/27/2014   CREATININE 1.15* 10/30/2014 0432   CALCIUM 8.9 10/30/2014 0432   PROT 7.0 10/25/2014 0235   ALBUMIN 3.5 10/25/2014 0235   AST 35 10/25/2014 0235   ALT 36* 10/25/2014 0235   ALKPHOS 82 10/25/2014 0235   BILITOT 0.6 10/25/2014 0235   GFRNONAA 44* 10/30/2014 0432   GFRAA 51* 10/30/2014 0432    Assessment and Plan  No problem-specific assessment & plan notes found for this encounter.   Hennie Duos, MD

## 2015-04-11 ENCOUNTER — Encounter: Payer: Self-pay | Admitting: Internal Medicine

## 2015-04-11 DIAGNOSIS — Z789 Other specified health status: Secondary | ICD-10-CM | POA: Insufficient documentation

## 2015-04-11 LAB — CBC AND DIFFERENTIAL
HCT: 37 % (ref 36–46)
Hemoglobin: 12.6 g/dL (ref 12.0–16.0)
Platelets: 248 10*3/uL (ref 150–399)
WBC: 11.8 10^3/mL

## 2015-04-11 NOTE — Assessment & Plan Note (Addendum)
Have ordered albuterol nebs scheduled for wheezing. Not severe enough for steroids. CBC, CXR and influenza test. May be PNA but looks viral.

## 2015-05-08 ENCOUNTER — Non-Acute Institutional Stay (SKILLED_NURSING_FACILITY): Payer: Medicare Other | Admitting: Nurse Practitioner

## 2015-05-08 ENCOUNTER — Encounter: Payer: Self-pay | Admitting: Nurse Practitioner

## 2015-05-08 DIAGNOSIS — M5412 Radiculopathy, cervical region: Secondary | ICD-10-CM

## 2015-05-08 DIAGNOSIS — J432 Centrilobular emphysema: Secondary | ICD-10-CM

## 2015-05-08 DIAGNOSIS — E038 Other specified hypothyroidism: Secondary | ICD-10-CM | POA: Diagnosis not present

## 2015-05-08 DIAGNOSIS — F411 Generalized anxiety disorder: Secondary | ICD-10-CM

## 2015-05-08 DIAGNOSIS — I10 Essential (primary) hypertension: Secondary | ICD-10-CM

## 2015-05-08 DIAGNOSIS — M4712 Other spondylosis with myelopathy, cervical region: Secondary | ICD-10-CM | POA: Diagnosis not present

## 2015-05-08 DIAGNOSIS — E034 Atrophy of thyroid (acquired): Secondary | ICD-10-CM | POA: Diagnosis not present

## 2015-05-08 DIAGNOSIS — I5032 Chronic diastolic (congestive) heart failure: Secondary | ICD-10-CM

## 2015-05-08 NOTE — Progress Notes (Signed)
Patient ID: Dawn Montoya, female   DOB: Dec 11, 1931, 81 y.o.   MRN: [id number]    Nursing Home Location:  [facility]   Place of Service: SNF (31)  PCP: Bartholome Bill, MD  No Known Allergies  Chief Complaint  Patient presents with  . Medical Management of Chronic Issues    Routine Visit     HPI:  Patient is a 81 y.o. female seen today at [facility] for routine follow up on chronic conditions. Patient has a past medical history of COPD, HTN, diastolic CHF, and hypothyroidism.  Last month pt was changed from Zoloft to Cymbalta to see if this would improved neuropathic pain in right hand, pt does not report any improvement in pain.  Pt denies worsening of anxiety however appears more anxious today. No worsening of her shortness of breath, cough or congestion Weight has been stable. Has some increased edema today, nursing reports she will not take her diuretic consisently due to having to urinate more.  Pt reports she feels well. Eating good. No problems with bowels.  Has orange tinge to nails after fingernail polish was applied-- educated to not use polish while nails are growing out.   Review of Systems:  Review of Systems  Constitutional: Negative for activity change, appetite change, fatigue and unexpected weight change.  HENT: Negative for congestion, hearing loss, postnasal drip, rhinorrhea, sinus pressure, sneezing, sore throat and trouble swallowing.   Eyes: Negative.   Respiratory: Negative for cough, shortness of breath and stridor.   Cardiovascular: Positive for leg swelling (to bilateral LE). Negative for chest pain and palpitations.  Gastrointestinal: Negative for abdominal pain, diarrhea and constipation.  Genitourinary: Negative for dysuria, frequency and difficulty urinating.  Musculoskeletal: Negative for myalgias, arthralgias and gait problem.  Skin: Negative for wound.  Neurological: Positive for tremors. Negative for  dizziness, weakness, light-headedness and headaches.  Psychiatric/Behavioral: Negative for behavioral problems, confusion and agitation. The patient is nervous/anxious (has improved with medications).     Past Medical History  Diagnosis Date  . COPD (chronic obstructive pulmonary disease)   . Breast CA   . Hypertension   . HTN (hypertension) 01/18/2014  . Hypothyroidism 01/18/2014  . Medical history non-contributory   . Shortness of breath   . Pneumonia   . Diastolic CHF 0/10/270  . CKD (chronic kidney disease) stage 3, GFR 30-59 ml/min 12/15/2014  . Anxiety state 02/25/2015   Past Surgical History  Procedure Laterality Date  . Cholecystectomy    . Breast surgery    . Mastectomy Left   . Esophagogastroduodenoscopy N/A 01/19/2014    Procedure: ESOPHAGOGASTRODUODENOSCOPY (EGD);  Surgeon: Jeryl Columbia, MD;  Location: [facility];  Service: Endoscopy;  Laterality: N/A;  . Colonoscopy N/A 01/20/2014    Procedure: COLONOSCOPY;  Surgeon: Winfield Cunas., MD;  Location: [facility];  Service: Endoscopy;  Laterality: N/A;   Social History:   reports that she quit smoking about 15 years ago. Her smoking use included Cigarettes. She has a 100 pack-year smoking history. She has never used smokeless tobacco. She reports that she does not drink alcohol or use illicit drugs.  Family History  Problem Relation Age of Onset  . Lung cancer Sister   . Breast cancer Sister   . Breast cancer Daughter     Medications: Patient's Medications  New Prescriptions   No medications on file  Previous Medications   ACETAMINOPHEN (TYLENOL) 325 MG TABLET    Take 650 mg by mouth every  6 (six) hours as needed. Notify MD if not relieved DO NOT EXCEED 3000 mg in 24 hour period   ALBUTEROL (PROVENTIL HFA;VENTOLIN HFA) 108 (90 BASE) MCG/ACT INHALER    Inhale 2 puffs into the lungs every 6 (six) hours as needed for wheezing or shortness of breath.    ALBUTEROL (PROVENTIL) (2.5 MG/3ML) 0.083% NEBULIZER SOLUTION     Take 2.5 mg by nebulization every 6 (six) hours as needed for wheezing or shortness of breath.   BENZONATATE (TESSALON) 100 MG CAPSULE    Take 1 capsule (100 mg total) by mouth 3 (three) times daily as needed for cough.   BISACODYL (DULCOLAX) 10 MG SUPPOSITORY    Place 10 mg rectally as needed for moderate constipation. If not relieved by Bisacodyl give Saline Enema   BUDESONIDE (PULMICORT) 0.25 MG/2ML NEBULIZER SOLUTION    Take 0.25 mg by nebulization 2 (two) times daily.   CHOLECALCIFEROL (VITAMIN D) 1000 UNITS TABLET    Take 1,000 Units by mouth daily.   DEXTROMETHORPHAN-GUAIFENESIN (MUCINEX DM MAXIMUM STRENGTH) 60-1200 MG TB12    Take by mouth 2 (two) times daily. For cough   DULOXETINE (CYMBALTA) 60 MG CAPSULE    Take 1 capsule (60 mg total) by mouth daily.   FLUTICASONE-SALMETEROL (ADVAIR DISKUS) 500-50 MCG/DOSE AEPB    Inhale 1 puff into the lungs 2 (two) times daily.   FUROSEMIDE (LASIX) 40 MG TABLET    Take 2 tablets (80 mg total) by mouth daily.   GUAIFENESIN (ROBITUSSIN) 100 MG/5ML SYRUP    10 cc by mouth every 4 hours as needed x 48 hours for cough   HYDROCHLOROTHIAZIDE (HYDRODIURIL) 25 MG TABLET    Take 25 mg by mouth daily.   LEVOTHYROXINE (SYNTHROID, LEVOTHROID) 75 MCG TABLET    Take 75 mcg by mouth daily before breakfast.   LISINOPRIL (PRINIVIL,ZESTRIL) 10 MG TABLET    Take 1 tablet (10 mg total) by mouth daily.   LORAZEPAM (ATIVAN) 0.5 MG TABLET    Take 0.5 mg by mouth every 8 (eight) hours. For anxiety   LORAZEPAM (ATIVAN) 1 MG TABLET    Give 1 by mouth prior to CT Scan give at [place] (MILK OF MAGNESIA) 400 MG/5ML SUSPENSION    Take 30 mLs by mouth once. In 24 hours as needed. If not relieved by MOM give Bisacodyl   MELATONIN 3 MG TABS    Take by mouth at bedtime.   MULTIPLE VITAMINS-MINERALS (MULTIVITAMIN WITH MINERALS) TABLET    Take 1 tablet by mouth daily.   OXYCODONE (OXY IR/ROXICODONE) 5 MG IMMEDIATE RELEASE TABLET    Take 1 tablet (5 mg total) by  mouth every 4 (four) hours as needed for severe pain.   PANTOPRAZOLE (PROTONIX) 40 MG TABLET    Take 1 tablet (40 mg total) by mouth daily at 12 noon.   SODIUM PHOSPHATES (RA SALINE ENEMA RE)    Place rectally. Rectally x 1 dose/24 hours   TIOTROPIUM (SPIRIVA HANDIHALER) 18 MCG INHALATION CAPSULE    Place 1 capsule (18 mcg total) into inhaler and inhale daily.  Modified Medications   No medications on file  Discontinued Medications   BUSPIRONE (BUSPAR) 7.5 MG TABLET    Take 7.5 mg by mouth 3 (three) times daily.   SERTRALINE (ZOLOFT) 50 MG TABLET    Take 1 tablet (50 mg total) by mouth daily.     Physical Exam: Filed Vitals:   05/08/15 1016  BP: 101/53  Pulse: 67  Temp: 97.5  F (36.4 C)  TempSrc: Oral  Resp: 18    Physical Exam  Constitutional: She is oriented to person, place, and time. She appears well-developed and well-nourished. No distress.  HENT:  Head: Normocephalic and atraumatic.  Mouth/Throat: Oropharynx is clear and moist. No oropharyngeal exudate.  Eyes: Conjunctivae are normal. Pupils are equal, round, and reactive to light.  Neck: Normal range of motion. Neck supple.  Cardiovascular: Normal rate, regular rhythm and normal heart sounds.   Pulmonary/Chest: Effort normal and breath sounds normal. No respiratory distress. She has no wheezes.  Abdominal: Soft. Bowel sounds are normal.  Musculoskeletal: She exhibits edema (2+ edema to bilateral LE ). She exhibits no tenderness.  Lymphadenopathy:    She has no cervical adenopathy.  Neurological: She is alert and oriented to person, place, and time.  Skin: Skin is warm and dry. No rash noted. She is not diaphoretic. No erythema.  Psychiatric: She has a normal mood and affect.    Labs reviewed: Basic Metabolic Panel:  Recent Labs  10/28/14 0415 10/29/14 0400 10/30/14 0432 11/27/14 02/28/15  NA 135 135 141 137 139  K 3.7 3.8 3.6 4.0 4.4  CL 94* 94* 96  --   --   CO2 34* 36* 38*  --   --   GLUCOSE 134* 99 115*   --   --   BUN 27* 26* 31* 25* 20  CREATININE 0.94 1.08 1.15* 0.9 0.8  CALCIUM 8.6 8.6 8.9  --   --   MG 2.1  --   --   --   --    Liver Function Tests:  Recent Labs  10/24/14 2107 10/25/14 0235  AST 42* 35  ALT 40* 36*  ALKPHOS 91 82  BILITOT 1.0 0.6  PROT 7.5 7.0  ALBUMIN 3.8 3.5   No results for input(s): LIPASE, AMYLASE in the last 8760 hours. No results for input(s): AMMONIA in the last 8760 hours. CBC:  Recent Labs  10/24/14 2107 10/25/14 0235  10/27/14 0439 10/29/14 0400 10/30/14 0432 04/11/15  WBC 14.3* 10.2  < > 12.0* 10.4 11.0* 11.8  NEUTROABS 10.4* 9.7*  --   --   --   --   --   HGB 13.3 12.0  < > 13.1 12.6 12.7 12.6  HCT 41.6 37.3  < > 42.4 42.4 42.0 37  MCV 89.7 89.2  < > 92.0 93.2 93.3  --   PLT 270 215  < > 317 315 298 248  < > = values in this interval not displayed. TSH:  Recent Labs  10/25/14 0235  TSH 1.718   A1C: Lab Results  Component Value Date   HGBA1C 6.5* 10/26/2014   Lipid Panel: No results for input(s): CHOL, HDL, LDLCALC, TRIG, CHOLHDL, LDLDIRECT in the last 8760 hours.   Assessment/Plan 1. Diastolic CHF, chronic -pt with increased edema to LE, no worsening of shortness of breath, sits in wheel chair with legs down most the day and staff reports she will decline her lasix due to making her use the restroom frequently -weight has remained stable at 249 lbs -ongoing education and monitoring to be provided  2. Essential hypertension -stable on lisinopril, lasix, HCTZ  3. Centrilobular emphysema Notes drop in O2 when walking but resumes after rest, denies worsening shortness of breath or cough or congestion -to cont current regimen   4. Hypothyroidism due to acquired atrophy of thyroid TSH stable in jan, to cont synthroid 75 mcg  5. Anxiety state Changed to Cymbalta last  month due to nerve pain, tolerating well. Anxiety seems to be someone worse today but pt denies worsening of symptoms and staff has not notice increase in  anxious behaviors or agitation  6. Cervical myelopathy with cervical radiculopathy -unchanged at this time. Will cont on Cymbalta for now -cont to monitor   7. Pain with urination -ongoing for last 2 months, urology referral ordered but appt has not been made, UA was negative at that time  Caroleen K. Harle Battiest  [facility] [phone] 8 am - 5 pm) [phone] (after hours)

## 2015-06-08 ENCOUNTER — Other Ambulatory Visit: Payer: Self-pay | Admitting: *Deleted

## 2015-06-08 MED ORDER — OXYCODONE HCL 5 MG PO TABS: 5.0000 mg | ORAL_TABLET | ORAL | Status: DC | PRN

## 2015-06-08 MED ORDER — OXYCODONE HCL 5 MG PO TABS: ORAL_TABLET | ORAL | Status: DC

## 2015-06-08 NOTE — Telephone Encounter (Signed)
[facility]

## 2015-06-08 NOTE — Telephone Encounter (Signed)
[facility] 

## 2015-06-22 ENCOUNTER — Encounter: Payer: Self-pay | Admitting: Internal Medicine

## 2015-06-22 ENCOUNTER — Non-Acute Institutional Stay (SKILLED_NURSING_FACILITY): Payer: Medicare Other | Admitting: Internal Medicine

## 2015-06-22 DIAGNOSIS — H00013 Hordeolum externum right eye, unspecified eyelid: Secondary | ICD-10-CM | POA: Diagnosis not present

## 2015-06-22 NOTE — Progress Notes (Signed)
MRN: [id number] Name: Dawn Montoya  Sex: female Age: 81 y.o. DOB: 07-08-1932  [place] #: Helene Kelp Facility/Room: Level Of Care: SNF Provider: Inocencio Homes D Emergency Contacts: Extended Emergency Contact Information Primary Emergency Contact: Yvonne Kendall [place] Phone: [phone] Mobile Phone: [phone] Relation: None  Code Status:   Allergies: Review of patient's allergies indicates no known allergies.  Chief Complaint  Patient presents with  . Acute Visit    HPI: Patient is 81 y.o. female with past medical history of hypertension, CHF, OSA, COPD, hypothyroidism, and dementia who is being seen for swelling R lower eye lid. For several days, some pain, no d/c, no fever, no visual problems.   Past Medical History  Diagnosis Date  . COPD (chronic obstructive pulmonary disease)   . Breast CA   . Hypertension   . HTN (hypertension) 01/18/2014  . Hypothyroidism 01/18/2014  . Medical history non-contributory   . Shortness of breath   . Pneumonia   . Diastolic CHF 0/06/3266  . CKD (chronic kidney disease) stage 3, GFR 30-59 ml/min 12/15/2014  . Anxiety state 02/25/2015    Past Surgical History  Procedure Laterality Date  . Cholecystectomy    . Breast surgery    . Mastectomy Left   . Esophagogastroduodenoscopy N/A 01/19/2014    Procedure: ESOPHAGOGASTRODUODENOSCOPY (EGD);  Surgeon: Jeryl Columbia, MD;  Location: [facility];  Service: Endoscopy;  Laterality: N/A;  . Colonoscopy N/A 01/20/2014    Procedure: COLONOSCOPY;  Surgeon: Winfield Cunas., MD;  Location: [facility];  Service: Endoscopy;  Laterality: N/A;      Medication List       This list is accurate as of: 06/22/15  9:02 PM.  Always use your most recent med list.               acetaminophen 325 MG tablet  Commonly known as:  TYLENOL  Take 650 mg by mouth every 6 (six) hours as needed. Notify MD if not relieved DO NOT EXCEED 3000 mg in 24 hour period     albuterol 108 (90  BASE) MCG/ACT inhaler  Commonly known as:  PROVENTIL HFA;VENTOLIN HFA  Inhale 2 puffs into the lungs every 6 (six) hours as needed for wheezing or shortness of breath.     albuterol (2.5 MG/3ML) 0.083% nebulizer solution  Commonly known as:  PROVENTIL  Take 2.5 mg by nebulization every 6 (six) hours as needed for wheezing or shortness of breath.     benzonatate 100 MG capsule  Commonly known as:  TESSALON  Take 1 capsule (100 mg total) by mouth 3 (three) times daily as needed for cough.     bisacodyl 10 MG suppository  Commonly known as:  DULCOLAX  Place 10 mg rectally as needed for moderate constipation. If not relieved by Bisacodyl give Saline Enema     budesonide 0.25 MG/2ML nebulizer solution  Commonly known as:  PULMICORT  Take 0.25 mg by nebulization 2 (two) times daily.     cholecalciferol 1000 UNITS tablet  Commonly known as:  VITAMIN D  Take 1,000 Units by mouth daily.     DULoxetine 60 MG capsule  Commonly known as:  CYMBALTA  Take 1 capsule (60 mg total) by mouth daily.     Fluticasone-Salmeterol 500-50 MCG/DOSE Aepb  Commonly known as:  ADVAIR DISKUS  Inhale 1 puff into the lungs 2 (two) times daily.     furosemide 40 MG tablet  Commonly known as:  LASIX  Take 2 tablets (  80 mg total) by mouth daily.     guaifenesin 100 MG/5ML syrup  Commonly known as:  ROBITUSSIN  10 cc by mouth every 4 hours as needed x 48 hours for cough     hydrochlorothiazide 25 MG tablet  Commonly known as:  HYDRODIURIL  Take 25 mg by mouth daily.     levothyroxine 75 MCG tablet  Commonly known as:  SYNTHROID, LEVOTHROID  Take 75 mcg by mouth daily before breakfast.     lisinopril 10 MG tablet  Commonly known as:  PRINIVIL,ZESTRIL  Take 1 tablet (10 mg total) by mouth daily.     LORazepam 0.5 MG tablet  Commonly known as:  ATIVAN  Take 0.5 mg by mouth every 8 (eight) hours. For anxiety     LORazepam 1 MG tablet  Commonly known as:  ATIVAN  Give 1 by mouth prior to CT Scan  [facility] at [facility]     magnesium hydroxide 400 MG/5ML suspension  Commonly known as:  MILK OF MAGNESIA  Take 30 mLs by mouth once. In 24 hours as needed. If not relieved by MOM give Bisacodyl     Melatonin 3 MG Tabs  Take by mouth at bedtime.     MUCINEX DM MAXIMUM STRENGTH 60-1200 MG Tb12  Take by mouth 2 (two) times daily. For cough     multivitamin with minerals tablet  Take 1 tablet by mouth daily.     oxyCODONE 5 MG immediate release tablet  Commonly known as:  Oxy IR/ROXICODONE  Take one tablet by mouth every 4 hours as needed for pain     pantoprazole 40 MG tablet  Commonly known as:  PROTONIX  Take 1 tablet (40 mg total) by mouth daily at 12 noon.     RA SALINE ENEMA RE  Place rectally. Rectally x 1 dose/24 hours     tiotropium 18 MCG inhalation capsule  Commonly known as:  SPIRIVA HANDIHALER  Place 1 capsule (18 mcg total) into inhaler and inhale daily.        No orders of the defined types were placed in this encounter.    Immunization History  Administered Date(s) Administered  . Influenza Split 12/01/2014  . Influenza,inj,Quad PF,36+ Mos 01/21/2014    Social History  Substance Use Topics  . Smoking status: Former Smoker -- 2.00 packs/day for 50 years    Types: Cigarettes    Quit date: 10/25/1999  . Smokeless tobacco: Never Used  . Alcohol Use: No    Review of Systems  DATA OBTAINED: from patient, nurse GENERAL:  no fevers, fatigue, appetite changes SKIN: No itching, rash HEENT: swelling R lower lid as per HPI RESPIRATORY: No cough, wheezing, SOB CARDIAC: No chest pain, palpitations, lower extremity edema  GI: No abdominal pain, No N/V/D or constipation, No heartburn or reflux  GU: No dysuria, frequency or urgency, or incontinence  MUSCULOSKELETAL: No unrelieved bone/joint pain NEUROLOGIC: No headache, dizziness  PSYCHIATRIC: No overt anxiety or sadness  Filed Vitals:   06/22/15 2055  BP: 128/68  Pulse: 80  Temp: 97.6 F (36.4 C)  Resp:  19    Physical Exam  GENERAL APPEARANCE: Alert, conversant,obese WF No acute distress  SKIN: No diaphoresis rash HEENT: laege swelling R lower lid, medially, has not come to a head, conjunctiva is not injected, no d/c RESPIRATORY: Breathing is even, unlabored. Lung sounds are clear   CARDIOVASCULAR: Heart RRR no murmurs, rubs or gallops. No peripheral edema  GASTROINTESTINAL: Abdomen is soft, non-tender, not distended w/  normal bowel sounds.  GENITOURINARY: Bladder non tender, not distended  MUSCULOSKELETAL: No abnormal joints or musculature NEUROLOGIC: Cranial nerves 2-12 grossly intact. Moves all extremities PSYCHIATRIC: Mood and affect appropriate to situation, no behavioral issues  Patient Active Problem List   Diagnosis Date Noted  . Cervical myelopathy with cervical radiculopathy 05/08/2015  . Acute medical illness 04/11/2015  . Anxiety state 02/25/2015  . Solitary pulmonary nodule 01/01/2015  . Cellulitis of leg, right 12/15/2014  . CKD (chronic kidney disease) stage 3, GFR 30-59 ml/min 12/15/2014  . Carbon dioxide retention 12/15/2014  . Hyperglycemia 11/08/2014  . Acute on chronic diastolic heart failure 34/91/7915  . Uncontrolled hypertension 10/25/2014  . CHF (congestive heart failure) 10/24/2014  . Acute respiratory failure 02/23/2014  . Diastolic CHF, chronic 05/69/7948  . COPD (chronic obstructive pulmonary disease) 01/18/2014  . Acute on chronic respiratory failure 01/18/2014  . COPD with acute exacerbation 01/18/2014  . Anemia 01/18/2014  . Melena 01/18/2014  . GIB (gastrointestinal bleeding) 01/18/2014  . Leukocytosis 01/18/2014  . HTN (hypertension) 01/18/2014  . Hypothyroidism 01/18/2014    CBC    Component Value Date/Time   WBC 11.8 04/11/2015   WBC 11.0* 10/30/2014 0432   RBC 4.50 10/30/2014 0432   HGB 12.6 04/11/2015   HCT 37 04/11/2015   PLT 248 04/11/2015   MCV 93.3 10/30/2014 0432   LYMPHSABS 0.4* 10/25/2014 0235   MONOABS 0.1 10/25/2014  0235   EOSABS 0.0 10/25/2014 0235   BASOSABS 0.0 10/25/2014 0235    CMP     Component Value Date/Time   NA 139 02/28/2015   NA 141 10/30/2014 0432   K 4.4 02/28/2015   CL 96 10/30/2014 0432   CO2 38* 10/30/2014 0432   GLUCOSE 115* 10/30/2014 0432   BUN 20 02/28/2015   BUN 31* 10/30/2014 0432   CREATININE 0.8 02/28/2015   CREATININE 1.15* 10/30/2014 0432   CALCIUM 8.9 10/30/2014 0432   PROT 7.0 10/25/2014 0235   ALBUMIN 3.5 10/25/2014 0235   AST 35 10/25/2014 0235   ALT 36* 10/25/2014 0235   ALKPHOS 82 10/25/2014 0235   BILITOT 0.6 10/25/2014 0235   GFRNONAA 44* 10/30/2014 0432   GFRAA 51* 10/30/2014 0432    Assessment and Plan  R HORDEOLUM- warm compresses QID, cipro eye drops 5 times a day for 7 days; of doesn't rupture or resolve spontaneously will need f/u with eye doctor  Hennie Duos, MD

## 2015-07-03 ENCOUNTER — Non-Acute Institutional Stay (SKILLED_NURSING_FACILITY): Payer: Medicare Other | Admitting: Nurse Practitioner

## 2015-07-03 DIAGNOSIS — N183 Chronic kidney disease, stage 3 unspecified: Secondary | ICD-10-CM

## 2015-07-03 DIAGNOSIS — R739 Hyperglycemia, unspecified: Secondary | ICD-10-CM

## 2015-07-03 DIAGNOSIS — H00013 Hordeolum externum right eye, unspecified eyelid: Secondary | ICD-10-CM

## 2015-07-03 DIAGNOSIS — F411 Generalized anxiety disorder: Secondary | ICD-10-CM | POA: Diagnosis not present

## 2015-07-03 DIAGNOSIS — J432 Centrilobular emphysema: Secondary | ICD-10-CM | POA: Diagnosis not present

## 2015-07-03 DIAGNOSIS — D649 Anemia, unspecified: Secondary | ICD-10-CM | POA: Diagnosis not present

## 2015-07-03 DIAGNOSIS — G629 Polyneuropathy, unspecified: Secondary | ICD-10-CM | POA: Diagnosis not present

## 2015-07-03 DIAGNOSIS — I10 Essential (primary) hypertension: Secondary | ICD-10-CM

## 2015-07-03 DIAGNOSIS — I5032 Chronic diastolic (congestive) heart failure: Secondary | ICD-10-CM

## 2015-07-03 NOTE — Progress Notes (Signed)
Patient ID: Dawn Montoya, female   DOB: May 20, 1932, 81 y.o.   MRN: [id number]    Nursing Home Location:  [facility]   Place of Service: SNF (31)  PCP: Bartholome Bill, MD  No Known Allergies  Chief Complaint  Patient presents with  . Medical Management of Chronic Issues    HPI:  Patient is a 81 y.o. female seen today at [facility] for routine follow up on chronic conditions. Patient has a past medical history of COPD, HTN, diastolic CHF, and hypothyroidism.  Since last visit pt had follow up with urology and was placed on myrbetiq. Unsure if this has helped with urination at this time. Still reports pain that radiates when she urinates.  Pt beng treated with cipro opthalmic drops for right hordeolum. With current improvement to right eye Pt complaints of neuropathic pain to bilateral arms worse to right than left. Does not feel like she had significant improved with neuropathy which changed to Cymbalta. Anxiety and depression remains stable.  Review of Systems:  Review of Systems  Constitutional: Negative for activity change, appetite change, fatigue and unexpected weight change.  HENT: Negative for congestion, hearing loss, postnasal drip, rhinorrhea, sinus pressure, sneezing, sore throat and trouble swallowing.   Eyes: Negative.   Respiratory: Negative for cough, shortness of breath and stridor.   Cardiovascular: Positive for leg swelling (to bilateral LE). Negative for chest pain and palpitations.  Gastrointestinal: Negative for abdominal pain, diarrhea and constipation.  Genitourinary: Negative for dysuria, frequency and difficulty urinating.  Musculoskeletal: Negative for myalgias, arthralgias and gait problem.  Skin: Negative for wound.  Neurological: Positive for tremors and numbness (bilterally LE). Negative for dizziness, weakness, light-headedness and headaches.  Psychiatric/Behavioral: Negative for behavioral problems, confusion and  agitation. The patient is nervous/anxious (has improved with medications).     Past Medical History  Diagnosis Date  . COPD (chronic obstructive pulmonary disease)   . Breast CA   . Hypertension   . HTN (hypertension) 01/18/2014  . Hypothyroidism 01/18/2014  . Medical history non-contributory   . Shortness of breath   . Pneumonia   . Diastolic CHF 12/26/7423  . CKD (chronic kidney disease) stage 3, GFR 30-59 ml/min 12/15/2014  . Anxiety state 02/25/2015   Past Surgical History  Procedure Laterality Date  . Cholecystectomy    . Breast surgery    . Mastectomy Left   . Esophagogastroduodenoscopy N/A 01/19/2014    Procedure: ESOPHAGOGASTRODUODENOSCOPY (EGD);  Surgeon: Jeryl Columbia, MD;  Location: [facility];  Service: Endoscopy;  Laterality: N/A;  . Colonoscopy N/A 01/20/2014    Procedure: COLONOSCOPY;  Surgeon: Winfield Cunas., MD;  Location: [facility];  Service: Endoscopy;  Laterality: N/A;   Social History:   reports that she quit smoking about 15 years ago. Her smoking use included Cigarettes. She has a 100 pack-year smoking history. She has never used smokeless tobacco. She reports that she does not drink alcohol or use illicit drugs.  Family History  Problem Relation Age of Onset  . Lung cancer Sister   . Breast cancer Sister   . Breast cancer Daughter     Medications: Patient's Medications  New Prescriptions   No medications on file  Previous Medications   ACETAMINOPHEN (TYLENOL) 325 MG TABLET    Take 650 mg by mouth every 6 (six) hours as needed. Notify MD if not relieved DO NOT EXCEED 3000 mg in 24 hour period   ALBUTEROL (PROVENTIL HFA;VENTOLIN HFA) 108 (90 BASE)  MCG/ACT INHALER    Inhale 2 puffs into the lungs every 6 (six) hours as needed for wheezing or shortness of breath.    ALBUTEROL (PROVENTIL) (2.5 MG/3ML) 0.083% NEBULIZER SOLUTION    Take 2.5 mg by nebulization every 6 (six) hours as needed for wheezing or shortness of breath.   BENZONATATE (TESSALON) 100  MG CAPSULE    Take 1 capsule (100 mg total) by mouth 3 (three) times daily as needed for cough.   BISACODYL (DULCOLAX) 10 MG SUPPOSITORY    Place 10 mg rectally as needed for moderate constipation. If not relieved by Bisacodyl give Saline Enema   BUDESONIDE (PULMICORT) 0.25 MG/2ML NEBULIZER SOLUTION    Take 0.25 mg by nebulization 2 (two) times daily.   CHOLECALCIFEROL (VITAMIN D) 1000 UNITS TABLET    Take 1,000 Units by mouth daily.   DEXTROMETHORPHAN-GUAIFENESIN (MUCINEX DM MAXIMUM STRENGTH) 60-1200 MG TB12    Take by mouth 2 (two) times daily. For cough   DULOXETINE (CYMBALTA) 60 MG CAPSULE    Take 1 capsule (60 mg total) by mouth daily.   FLUTICASONE-SALMETEROL (ADVAIR DISKUS) 500-50 MCG/DOSE AEPB    Inhale 1 puff into the lungs 2 (two) times daily.   FUROSEMIDE (LASIX) 40 MG TABLET    Take 2 tablets (80 mg total) by mouth daily.   GUAIFENESIN (ROBITUSSIN) 100 MG/5ML SYRUP    10 cc by mouth every 4 hours as needed x 48 hours for cough   HYDROCHLOROTHIAZIDE (HYDRODIURIL) 25 MG TABLET    Take 25 mg by mouth daily.   LEVOTHYROXINE (SYNTHROID, LEVOTHROID) 75 MCG TABLET    Take 75 mcg by mouth daily before breakfast.   LISINOPRIL (PRINIVIL,ZESTRIL) 10 MG TABLET    Take 1 tablet (10 mg total) by mouth daily.   LORAZEPAM (ATIVAN) 0.5 MG TABLET    Take 0.5 mg by mouth every 8 (eight) hours. For anxiety   LORAZEPAM (ATIVAN) 1 MG TABLET    Give 1 by mouth prior to CT Scan give at [place] (MILK OF MAGNESIA) 400 MG/5ML SUSPENSION    Take 30 mLs by mouth once. In 24 hours as needed. If not relieved by MOM give Bisacodyl   MELATONIN 3 MG TABS    Take by mouth at bedtime.   MIRABEGRON ER (MYRBETRIQ) 25 MG TB24 TABLET    Take 25 mg by mouth daily.   MULTIPLE VITAMINS-MINERALS (MULTIVITAMIN WITH MINERALS) TABLET    Take 1 tablet by mouth daily.   OXYCODONE (OXY IR/ROXICODONE) 5 MG IMMEDIATE RELEASE TABLET    Take one tablet by mouth every 4 hours as needed for pain   PANTOPRAZOLE  (PROTONIX) 40 MG TABLET    Take 1 tablet (40 mg total) by mouth daily at 12 noon.   SODIUM PHOSPHATES (RA SALINE ENEMA RE)    Place rectally. Rectally x 1 dose/24 hours   TIOTROPIUM (SPIRIVA HANDIHALER) 18 MCG INHALATION CAPSULE    Place 1 capsule (18 mcg total) into inhaler and inhale daily.  Modified Medications   No medications on file  Discontinued Medications   No medications on file     Physical Exam: Filed Vitals:   07/03/15 1702  BP: 138/51  Pulse: 60  Temp: 97.1 F (36.2 C)  Resp: 20  Weight: 260 lb (117.935 kg)    Physical Exam  Constitutional: She is oriented to person, place, and time. She appears well-developed and well-nourished. No distress.  HENT:  Head: Normocephalic and atraumatic.  Mouth/Throat: Oropharynx is clear and  moist. No oropharyngeal exudate.  Eyes: Conjunctivae are normal. Pupils are equal, round, and reactive to light.  Neck: Normal range of motion. Neck supple.  Cardiovascular: Normal rate, regular rhythm and normal heart sounds.   Pulmonary/Chest: Effort normal and breath sounds normal. No respiratory distress. She has no wheezes.  Abdominal: Soft. Bowel sounds are normal.  Musculoskeletal: She exhibits edema (1+ bilateral LE). She exhibits no tenderness.  Lymphadenopathy:    She has no cervical adenopathy.  Neurological: She is alert and oriented to person, place, and time.  Skin: Skin is warm and dry. No rash noted. She is not diaphoretic. No erythema.  Psychiatric: She has a normal mood and affect.    Labs reviewed: Basic Metabolic Panel:  Recent Labs  10/28/14 0415 10/29/14 0400 10/30/14 0432 11/27/14 02/28/15  NA 135 135 141 137 139  K 3.7 3.8 3.6 4.0 4.4  CL 94* 94* 96  --   --   CO2 34* 36* 38*  --   --   GLUCOSE 134* 99 115*  --   --   BUN 27* 26* 31* 25* 20  CREATININE 0.94 1.08 1.15* 0.9 0.8  CALCIUM 8.6 8.6 8.9  --   --   MG 2.1  --   --   --   --    Liver Function Tests:  Recent Labs  10/24/14 2107 10/25/14 0235   AST 42* 35  ALT 40* 36*  ALKPHOS 91 82  BILITOT 1.0 0.6  PROT 7.5 7.0  ALBUMIN 3.8 3.5   No results for input(s): LIPASE, AMYLASE in the last 8760 hours. No results for input(s): AMMONIA in the last 8760 hours. CBC:  Recent Labs  10/24/14 2107 10/25/14 0235  10/27/14 0439 10/29/14 0400 10/30/14 0432 04/11/15  WBC 14.3* 10.2  < > 12.0* 10.4 11.0* 11.8  NEUTROABS 10.4* 9.7*  --   --   --   --   --   HGB 13.3 12.0  < > 13.1 12.6 12.7 12.6  HCT 41.6 37.3  < > 42.4 42.4 42.0 37  MCV 89.7 89.2  < > 92.0 93.2 93.3  --   PLT 270 215  < > 317 315 298 248  < > = values in this interval not displayed. TSH:  Recent Labs  10/25/14 0235  TSH 1.718   A1C: Lab Results  Component Value Date   HGBA1C 6.5* 10/26/2014   Lipid Panel: No results for input(s): CHOL, HDL, LDLCALC, TRIG, CHOLHDL, LDLDIRECT in the last 8760 hours.   Assessment/Plan  1. Hordeolum externum, right Improved with cipro. To cont full 2 week course.   2. Diastolic CHF, chronic Has been stable, pt conts to refuse lasix at times due to increased urination. No  worsening shortness of breath or chest pain or LE edema.   3. Essential hypertension Stable, cont on current regimen   4. Centrilobular emphysema COPD remains stable, conts advair and spiriva   5. Anxiety state Stable at this time, will cont cymbalta and ativan as needed  6. CKD (chronic kidney disease) stage 3, GFR 30-59 ml/min Will follow up bmp   7. Anemia, unspecified anemia type hgb has been stable, will follow up CBC  8. Hyperglycemia Will follow up A1c at this time   9. Neuropathy  -will start lyrica 50 mg TID for bilateral upper extremity   Joleene Burnham K. Harle Battiest  [facility] [phone] 8 am - 5 pm) [phone] (after hours)

## 2015-07-13 ENCOUNTER — Non-Acute Institutional Stay (SKILLED_NURSING_FACILITY): Payer: Medicare Other | Admitting: Internal Medicine

## 2015-07-13 ENCOUNTER — Encounter: Payer: Self-pay | Admitting: Internal Medicine

## 2015-07-13 DIAGNOSIS — R32 Unspecified urinary incontinence: Secondary | ICD-10-CM

## 2015-07-13 DIAGNOSIS — F411 Generalized anxiety disorder: Secondary | ICD-10-CM

## 2015-07-13 DIAGNOSIS — N393 Stress incontinence (female) (male): Secondary | ICD-10-CM

## 2015-07-13 DIAGNOSIS — G629 Polyneuropathy, unspecified: Secondary | ICD-10-CM | POA: Diagnosis not present

## 2015-07-13 DIAGNOSIS — H8113 Benign paroxysmal vertigo, bilateral: Secondary | ICD-10-CM | POA: Diagnosis not present

## 2015-07-13 NOTE — Progress Notes (Signed)
MRN: [id number] Name: Dawn Montoya  Sex: female Age: 81 y.o. DOB: 1932/05/21  [place] #: Helene Kelp Facility/Room: Level Of Care: SNF Provider: Inocencio Homes D Emergency Contacts: Extended Emergency Contact Information Primary Emergency Contact: Yvonne Kendall [place] Phone: [phone] Mobile Phone: [phone] Relation: None  Code Status:   Allergies: Review of patient's allergies indicates no known allergies.  Chief Complaint  Patient presents with  . Acute Visit    HPI: Patient is 81 y.o. female with  COPD on home oxygen, diastolic congestive heart failure, hypothyroidism, hypertension who nursing asked me to see because pt is c/o pain all over and wants to go to the ED. Nursing feels like pt is having an anxiety attack and have already given her a prn ativan.  Past Medical History  Diagnosis Date  . COPD (chronic obstructive pulmonary disease)   . Breast CA   . Hypertension   . HTN (hypertension) 01/18/2014  . Hypothyroidism 01/18/2014  . Medical history non-contributory   . Shortness of breath   . Pneumonia   . Diastolic CHF 05/25/5052  . CKD (chronic kidney disease) stage 3, GFR 30-59 ml/min 12/15/2014  . Anxiety state 02/25/2015    Past Surgical History  Procedure Laterality Date  . Cholecystectomy    . Breast surgery    . Mastectomy Left   . Esophagogastroduodenoscopy N/A 01/19/2014    Procedure: ESOPHAGOGASTRODUODENOSCOPY (EGD);  Surgeon: Jeryl Columbia, MD;  Location: [facility];  Service: Endoscopy;  Laterality: N/A;  . Colonoscopy N/A 01/20/2014    Procedure: COLONOSCOPY;  Surgeon: Winfield Cunas., MD;  Location: [facility];  Service: Endoscopy;  Laterality: N/A;      Medication List       This list is accurate as of: 07/13/15 11:59 PM.  Always use your most recent med list.               acetaminophen 325 MG tablet  Commonly known as:  TYLENOL  Take 650 mg by mouth every 6 (six) hours as needed. Notify MD if not relieved DO  NOT EXCEED 3000 mg in 24 hour period     albuterol 108 (90 BASE) MCG/ACT inhaler  Commonly known as:  PROVENTIL HFA;VENTOLIN HFA  Inhale 2 puffs into the lungs every 6 (six) hours as needed for wheezing or shortness of breath.     albuterol (2.5 MG/3ML) 0.083% nebulizer solution  Commonly known as:  PROVENTIL  Take 2.5 mg by nebulization every 6 (six) hours as needed for wheezing or shortness of breath.     benzonatate 100 MG capsule  Commonly known as:  TESSALON  Take 1 capsule (100 mg total) by mouth 3 (three) times daily as needed for cough.     bisacodyl 10 MG suppository  Commonly known as:  DULCOLAX  Place 10 mg rectally as needed for moderate constipation. If not relieved by Bisacodyl give Saline Enema     budesonide 0.25 MG/2ML nebulizer solution  Commonly known as:  PULMICORT  Take 0.25 mg by nebulization 2 (two) times daily.     cholecalciferol 1000 UNITS tablet  Commonly known as:  VITAMIN D  Take 1,000 Units by mouth daily.     DULoxetine 60 MG capsule  Commonly known as:  CYMBALTA  Take 1 capsule (60 mg total) by mouth daily.     Fluticasone-Salmeterol 500-50 MCG/DOSE Aepb  Commonly known as:  ADVAIR DISKUS  Inhale 1 puff into the lungs 2 (two) times daily.     furosemide  40 MG tablet  Commonly known as:  LASIX  Take 2 tablets (80 mg total) by mouth daily.     guaifenesin 100 MG/5ML syrup  Commonly known as:  ROBITUSSIN  10 cc by mouth every 4 hours as needed x 48 hours for cough     hydrochlorothiazide 25 MG tablet  Commonly known as:  HYDRODIURIL  Take 25 mg by mouth daily.     levothyroxine 75 MCG tablet  Commonly known as:  SYNTHROID, LEVOTHROID  Take 75 mcg by mouth daily before breakfast.     lisinopril 10 MG tablet  Commonly known as:  PRINIVIL,ZESTRIL  Take 1 tablet (10 mg total) by mouth daily.     LORazepam 0.5 MG tablet  Commonly known as:  ATIVAN  Take 0.5 mg by mouth every 8 (eight) hours. For anxiety     LORazepam 1 MG tablet   Commonly known as:  ATIVAN  Give 1 by mouth prior to CT Scan give at [facility]     magnesium hydroxide 400 MG/5ML suspension  Commonly known as:  MILK OF MAGNESIA  Take 30 mLs by mouth once. In 24 hours as needed. If not relieved by MOM give Bisacodyl     Melatonin 3 MG Tabs  Take by mouth at bedtime.     MUCINEX DM MAXIMUM STRENGTH 60-1200 MG Tb12  Take by mouth 2 (two) times daily. For cough     multivitamin with minerals tablet  Take 1 tablet by mouth daily.     MYRBETRIQ 25 MG Tb24 tablet  Generic drug:  mirabegron ER  Take 25 mg by mouth daily.     oxyCODONE 5 MG immediate release tablet  Commonly known as:  Oxy IR/ROXICODONE  Take one tablet by mouth every 4 hours as needed for pain     pantoprazole 40 MG tablet  Commonly known as:  PROTONIX  Take 1 tablet (40 mg total) by mouth daily at 12 noon.     RA SALINE ENEMA RE  Place rectally. Rectally x 1 dose/24 hours     tiotropium 18 MCG inhalation capsule  Commonly known as:  SPIRIVA HANDIHALER  Place 1 capsule (18 mcg total) into inhaler and inhale daily.        No orders of the defined types were placed in this encounter.    Immunization History  Administered Date(s) Administered  . Influenza Split 12/01/2014  . Influenza,inj,Quad PF,36+ Mos 01/21/2014    Social History  Substance Use Topics  . Smoking status: Former Smoker -- 2.00 packs/day for 50 years    Types: Cigarettes    Quit date: 10/25/1999  . Smokeless tobacco: Never Used  . Alcohol Use: No    Review of Systems  DATA OBTAINED: from patient, nurse GENERAL:  no fevers, fatigue, appetite changes SKIN: No itching, rash HEENT: No complaint RESPIRATORY: No cough, wheezing, SOB CARDIAC: No chest pain, palpitations, lower extremity edema  GI: No abdominal pain, No N/V/D or constipation, No heartburn or reflux  GU: No dysuria, frequency or urgency; increased  incontinence  MUSCULOSKELETAL: No unrelieved bone/joint pain NEUROLOGIC: No  headache, c/o dizziness, admits sudden and like drunk or room spinning, not having it at this moment; c/o pins and needles feeling in extremities and pain; admits she is on Lyrica nad daughter had the same response when she was on Lyrica  PSYCHIATRIC: admits may be anxious  Filed Vitals:   07/13/15 2046  BP: 158/94  Pulse: 92  Temp: 97 F (36.1 C)  Resp:  22    Physical Exam  GENERAL APPEARANCE: Alert, conversant, appears very anxious, better as we visit SKIN: No diaphoresis rash, or wounds HEENT: Unremarkable, no nystagmus RESPIRATORY: Breathing is even, unlabored. Lung sounds are clear   CARDIOVASCULAR: Heart RRR no murmurs, rubs or gallops. + peripheral edema  GASTROINTESTINAL: Abdomen is soft, non-tender, not distended w/ normal bowel sounds.  GENITOURINARY: Bladder non tender, not distended  MUSCULOSKELETAL: No abnormal joints or musculature NEUROLOGIC: Cranial nerves 2-12 grossly intact. Moves all extremities PSYCHIATRIC: anxious, no behavioral issues  Patient Active Problem List   Diagnosis Date Noted  . Benign paroxysmal positional vertigo 07/18/2015  . Neuropathy 07/13/2015  . Cervical myelopathy with cervical radiculopathy 05/08/2015  . Acute medical illness 04/11/2015  . Anxiety state 02/25/2015  . Solitary pulmonary nodule 01/01/2015  . Cellulitis of leg, right 12/15/2014  . CKD (chronic kidney disease) stage 3, GFR 30-59 ml/min 12/15/2014  . Carbon dioxide retention 12/15/2014  . Hyperglycemia 11/08/2014  . Acute on chronic diastolic heart failure 96/29/5284  . Uncontrolled hypertension 10/25/2014  . CHF (congestive heart failure) 10/24/2014  . Acute respiratory failure 02/23/2014  . Diastolic CHF, chronic 13/24/4010  . COPD (chronic obstructive pulmonary disease) 01/18/2014  . Acute on chronic respiratory failure 01/18/2014  . COPD with acute exacerbation 01/18/2014  . Anemia 01/18/2014  . Melena 01/18/2014  . GIB (gastrointestinal bleeding) 01/18/2014  .  Leukocytosis 01/18/2014  . HTN (hypertension) 01/18/2014  . Hypothyroidism 01/18/2014    CBC    Component Value Date/Time   WBC 11.8 04/11/2015   WBC 11.0* 10/30/2014 0432   RBC 4.50 10/30/2014 0432   HGB 12.6 04/11/2015   HCT 37 04/11/2015   PLT 248 04/11/2015   MCV 93.3 10/30/2014 0432   LYMPHSABS 0.4* 10/25/2014 0235   MONOABS 0.1 10/25/2014 0235   EOSABS 0.0 10/25/2014 0235   BASOSABS 0.0 10/25/2014 0235    CMP     Component Value Date/Time   NA 139 02/28/2015   NA 141 10/30/2014 0432   K 4.4 02/28/2015   CL 96 10/30/2014 0432   CO2 38* 10/30/2014 0432   GLUCOSE 115* 10/30/2014 0432   BUN 20 02/28/2015   BUN 31* 10/30/2014 0432   CREATININE 0.8 02/28/2015   CREATININE 1.15* 10/30/2014 0432   CALCIUM 8.9 10/30/2014 0432   PROT 7.0 10/25/2014 0235   ALBUMIN 3.5 10/25/2014 0235   AST 35 10/25/2014 0235   ALT 36* 10/25/2014 0235   ALKPHOS 82 10/25/2014 0235   BILITOT 0.6 10/25/2014 0235   GFRNONAA 44* 10/30/2014 0432   GFRAA 51* 10/30/2014 0432    Assessment and Plan  Neuropathy Pain in arms and legs - lyrica started -daug same reaction  Of extremity  pain when lyrica was used on her; will taper lyrica and start neurontin    Anxiety state Pt is highly exciteable, improved as ativan kicked in and with my visit  Benign paroxysmal positional vertigo By history and intermiddent; we discussed what to do when it is present;  antivert 12.5 mg TID prn  INCONTINENCE - could be presentation for UTI; order U/A with C and Tawni Millers, MD

## 2015-07-13 NOTE — Assessment & Plan Note (Addendum)
Pain in arms and legs - lyrica started -daug same reaction  Of extremity  pain when lyrica was used on her; will taper lyrica and start neurontin

## 2015-07-17 ENCOUNTER — Other Ambulatory Visit: Payer: Self-pay | Admitting: *Deleted

## 2015-07-17 MED ORDER — LORAZEPAM 0.5 MG PO TABS
ORAL_TABLET | ORAL | Status: DC
Start: 2015-07-17 — End: 2016-03-10

## 2015-07-17 NOTE — Telephone Encounter (Signed)
[facility] 

## 2015-07-18 ENCOUNTER — Encounter: Payer: Self-pay | Admitting: Internal Medicine

## 2015-07-18 DIAGNOSIS — H8110 Benign paroxysmal vertigo, unspecified ear: Secondary | ICD-10-CM | POA: Insufficient documentation

## 2015-07-18 NOTE — Assessment & Plan Note (Signed)
Pt is highly exciteable, improved as ativan kicked in and with my visit

## 2015-07-18 NOTE — Assessment & Plan Note (Signed)
By history and intermiddent; we discussed what to do when it is present;  antivert 12.5 mg TID prn

## 2015-08-28 ENCOUNTER — Encounter: Payer: Self-pay | Admitting: Nurse Practitioner

## 2015-08-28 ENCOUNTER — Non-Acute Institutional Stay (SKILLED_NURSING_FACILITY): Payer: Medicare Other | Admitting: Nurse Practitioner

## 2015-08-28 DIAGNOSIS — I10 Essential (primary) hypertension: Secondary | ICD-10-CM

## 2015-08-28 DIAGNOSIS — L03115 Cellulitis of right lower limb: Secondary | ICD-10-CM

## 2015-08-28 DIAGNOSIS — M25562 Pain in left knee: Secondary | ICD-10-CM

## 2015-08-28 DIAGNOSIS — J432 Centrilobular emphysema: Secondary | ICD-10-CM

## 2015-08-28 DIAGNOSIS — E038 Other specified hypothyroidism: Secondary | ICD-10-CM | POA: Diagnosis not present

## 2015-08-28 DIAGNOSIS — I5033 Acute on chronic diastolic (congestive) heart failure: Secondary | ICD-10-CM | POA: Diagnosis not present

## 2015-08-28 DIAGNOSIS — E034 Atrophy of thyroid (acquired): Secondary | ICD-10-CM | POA: Diagnosis not present

## 2015-08-28 DIAGNOSIS — I5032 Chronic diastolic (congestive) heart failure: Secondary | ICD-10-CM | POA: Diagnosis not present

## 2015-08-28 NOTE — Progress Notes (Signed)
Nursing Home Location:  [facility]   Place of Service: SNF (31)  PCP: Bartholome Bill, MD  No Known Allergies  Chief Complaint  Patient presents with  . Medical Management of Chronic Issues    HPI:  Patient is a 81 y.o. female seen today at [facility] for medical management of her chronic issues.  She has a past medical history of COPD, HTN, hypothyroidism, CHF, and CKD.  She is seen today in her room and is complaining of left knee pain.  She says that her knee has hurt since she fell in September. She reports increased swelling to knee. She has full range of motion in her knee. She also has a 4-5cm round laceration on her right shin.  She says that she hit it on the edge of the table yesterday. Edges are approximated but there is surrounding erythema and the area is warm to touch and tender.  Pt also reports ongoing neuropathy in bilateral upper extremities. lyrica started but side effects noted with medication and therefore was changed to gabapentin, pt without much improvement at this time.  Review of Systems:  Review of Systems  Constitutional: Negative for fever, chills and fatigue.  HENT: Negative for congestion, postnasal drip and rhinorrhea.   Respiratory: Negative for cough, shortness of breath and wheezing.   Cardiovascular: Negative for chest pain, palpitations and leg swelling.  Gastrointestinal: Negative for abdominal pain, diarrhea and constipation.  Genitourinary: Negative for dysuria, hematuria and flank pain.  Musculoskeletal: Positive for joint swelling (in left knee). Negative for myalgias, back pain and arthralgias.  Neurological: Positive for numbness (to bilateral upper extermities ). Negative for dizziness and light-headedness.    Past Medical History  Diagnosis Date  . COPD (chronic obstructive pulmonary disease) ([place])   . Breast CA ([place])   . Hypertension   . HTN (hypertension) 01/18/2014  . Hypothyroidism 01/18/2014   . Medical history non-contributory   . Shortness of breath   . Pneumonia   . Diastolic CHF ([place]) 03/25/3761  . CKD (chronic kidney disease) stage 3, GFR 30-59 ml/min 12/15/2014  . Anxiety state 02/25/2015   Past Surgical History  Procedure Laterality Date  . Cholecystectomy    . Breast surgery    . Mastectomy Left   . Esophagogastroduodenoscopy N/A 01/19/2014    Procedure: ESOPHAGOGASTRODUODENOSCOPY (EGD);  Surgeon: Jeryl Columbia, MD;  Location: [facility];  Service: Endoscopy;  Laterality: N/A;  . Colonoscopy N/A 01/20/2014    Procedure: COLONOSCOPY;  Surgeon: Winfield Cunas., MD;  Location: [facility];  Service: Endoscopy;  Laterality: N/A;   Social History:   reports that she quit smoking about 15 years ago. Her smoking use included Cigarettes. She has a 100 pack-year smoking history. She has never used smokeless tobacco. She reports that she does not drink alcohol or use illicit drugs.  Family History  Problem Relation Age of Onset  . Lung cancer Sister   . Breast cancer Sister   . Breast cancer Daughter     Medications: Patient's Medications  New Prescriptions   No medications on file  Previous Medications   ACETAMINOPHEN (TYLENOL) 325 MG TABLET    Take 650 mg by mouth every 6 (six) hours as needed. Notify MD if not relieved DO NOT EXCEED 3000 mg in 24 hour period   ALBUTEROL (PROVENTIL HFA;VENTOLIN HFA) 108 (90 BASE) MCG/ACT INHALER    Inhale 2 puffs into the lungs every 6 (six) hours as needed for  wheezing or shortness of breath.    ALBUTEROL (PROVENTIL) (2.5 MG/3ML) 0.083% NEBULIZER SOLUTION    Take 2.5 mg by nebulization every 6 (six) hours as needed for wheezing or shortness of breath.   BENZONATATE (TESSALON) 100 MG CAPSULE    Take 1 capsule (100 mg total) by mouth 3 (three) times daily as needed for cough.   BISACODYL (DULCOLAX) 10 MG SUPPOSITORY    Place 10 mg rectally as needed for moderate constipation. If not relieved by Bisacodyl give Saline Enema   BUDESONIDE  (PULMICORT) 0.25 MG/2ML NEBULIZER SOLUTION    Take 0.25 mg by nebulization 2 (two) times daily.   CHOLECALCIFEROL (VITAMIN D) 1000 UNITS TABLET    Take 1,000 Units by mouth daily.   DEXTROMETHORPHAN-GUAIFENESIN (MUCINEX DM MAXIMUM STRENGTH) 60-1200 MG TB12    Take by mouth 2 (two) times daily. For cough   DULOXETINE (CYMBALTA) 60 MG CAPSULE    Take 1 capsule (60 mg total) by mouth daily.   FLUTICASONE-SALMETEROL (ADVAIR DISKUS) 500-50 MCG/DOSE AEPB    Inhale 1 puff into the lungs 2 (two) times daily.   FUROSEMIDE (LASIX) 40 MG TABLET    Take 2 tablets (80 mg total) by mouth daily.   GABAPENTIN (NEURONTIN) 100 MG CAPSULE    Take 100 mg by mouth 2 (two) times daily.   GABAPENTIN (NEURONTIN) 300 MG CAPSULE    Take 300 mg by mouth at bedtime.   GUAIFENESIN (ROBITUSSIN) 100 MG/5ML SYRUP    10 cc by mouth every 4 hours as needed x 48 hours for cough   HYDROCHLOROTHIAZIDE (HYDRODIURIL) 25 MG TABLET    Take 25 mg by mouth daily.   LEVOTHYROXINE (SYNTHROID, LEVOTHROID) 75 MCG TABLET    Take 75 mcg by mouth daily before breakfast.   LISINOPRIL (PRINIVIL,ZESTRIL) 10 MG TABLET    Take 1 tablet (10 mg total) by mouth daily.   LORAZEPAM (ATIVAN) 0.5 MG TABLET    Take one tablet by mouth every 8 hours as needed for anxiety   LORAZEPAM (ATIVAN) 1 MG TABLET    Give 1 by mouth prior to CT Scan give at [place] (MILK OF MAGNESIA) 400 MG/5ML SUSPENSION    Take 30 mLs by mouth once. In 24 hours as needed. If not relieved by MOM give Bisacodyl   MECLIZINE (ANTIVERT) 12.5 MG TABLET    Take 12.5 mg by mouth 3 (three) times daily as needed for dizziness.   MELATONIN 3 MG TABS    Take by mouth at bedtime.   MIRABEGRON ER (MYRBETRIQ) 25 MG TB24 TABLET    Take 25 mg by mouth daily.   MULTIPLE VITAMINS-MINERALS (MULTIVITAMIN WITH MINERALS) TABLET    Take 1 tablet by mouth daily.   OXYCODONE (OXY IR/ROXICODONE) 5 MG IMMEDIATE RELEASE TABLET    Take one tablet by mouth every 4 hours as needed for pain    PANTOPRAZOLE (PROTONIX) 40 MG TABLET    Take 1 tablet (40 mg total) by mouth daily at 12 noon.   POLYVINYL ALCOHOL (LIQUIFILM TEARS) 1.4 % OPHTHALMIC SOLUTION    Place 1 drop into both eyes as needed for dry eyes.   SODIUM PHOSPHATES (RA SALINE ENEMA RE)    Place rectally. Rectally x 1 dose/24 hours   TIOTROPIUM (SPIRIVA HANDIHALER) 18 MCG INHALATION CAPSULE    Place 1 capsule (18 mcg total) into inhaler and inhale daily.  Modified Medications   No medications on file  Discontinued Medications   No medications on file  Physical Exam: Filed Vitals:   08/28/15 1207  BP: 138/51  Pulse: 56  Temp: 97.1 F (36.2 C)  TempSrc: Oral  Resp: 21  Weight: 260 lb 6.4 oz (118.117 kg)    Physical Exam  Constitutional: She is oriented to person, place, and time. She appears well-developed and well-nourished. No distress.  HENT:  Head: Normocephalic and atraumatic.  Mouth/Throat: Oropharynx is clear and moist. No oropharyngeal exudate.  Eyes: Pupils are equal, round, and reactive to light.  Neck: Normal range of motion. Neck supple.  Cardiovascular: Normal rate, regular rhythm and normal heart sounds.   No murmur heard. Pulmonary/Chest: Effort normal and breath sounds normal. She has no wheezes.  Abdominal: Soft. Bowel sounds are normal. She exhibits no distension.  Musculoskeletal:       Left knee: She exhibits swelling. She exhibits normal range of motion, no ecchymosis, no laceration and no erythema. Tenderness found.  Lymphadenopathy:    She has no cervical adenopathy.  Neurological: She is alert and oriented to person, place, and time.  Skin: Skin is warm and dry. She is not diaphoretic.  Psychiatric: She has a normal mood and affect.    Labs reviewed: Basic Metabolic Panel:  Recent Labs  10/28/14 0415 10/29/14 0400 10/30/14 0432 11/27/14 02/28/15  NA 135 135 141 137 139  K 3.7 3.8 3.6 4.0 4.4  CL 94* 94* 96  --   --   CO2 34* 36* 38*  --   --   GLUCOSE 134* 99 115*  --    --   BUN 27* 26* 31* 25* 20  CREATININE 0.94 1.08 1.15* 0.9 0.8  CALCIUM 8.6 8.6 8.9  --   --   MG 2.1  --   --   --   --    Liver Function Tests:  Recent Labs  10/24/14 2107 10/25/14 0235  AST 42* 35  ALT 40* 36*  ALKPHOS 91 82  BILITOT 1.0 0.6  PROT 7.5 7.0  ALBUMIN 3.8 3.5   No results for input(s): LIPASE, AMYLASE in the last 8760 hours. No results for input(s): AMMONIA in the last 8760 hours. CBC:  Recent Labs  10/24/14 2107 10/25/14 0235  10/27/14 0439 10/29/14 0400 10/30/14 0432 04/11/15  WBC 14.3* 10.2  < > 12.0* 10.4 11.0* 11.8  NEUTROABS 10.4* 9.7*  --   --   --   --   --   HGB 13.3 12.0  < > 13.1 12.6 12.7 12.6  HCT 41.6 37.3  < > 42.4 42.4 42.0 37  MCV 89.7 89.2  < > 92.0 93.2 93.3  --   PLT 270 215  < > 317 315 298 248  < > = values in this interval not displayed. TSH:  Recent Labs  10/25/14 0235  TSH 1.718   A1C: Lab Results  Component Value Date   HGBA1C 6.5* 10/26/2014   Lipid Panel: No results for input(s): CHOL, HDL, LDLCALC, TRIG, CHOLHDL, LDLDIRECT in the last 8760 hours.  Radiological Exams: Left Knee X-ray (07/20/15) Impression: Degenerative changes only.  Assessment/Plan 1. Essential hypertension Stable.  Blood pressures within desirable range. Continue hctz and lisinopril.  Renal function within normal range. Will follow up BMP  2. Acute on chronic diastolic heart failure (HCC) Stable. No SOB, appears euvolemic. Continue furosemide.   3. Centrilobular emphysema (HCC) Stable.  Followed by pulmonology. Continue Pulmicort, Advair, and Spiriva.    4. Hypothyroidism due to acquired atrophy of thyroid Stable. Will obtain TSH.   5. Left  knee pain  Had X-ray on 07/20/15 and no fracture visualized.  Mild degenerative joint narrowing found. Has oxycodone 5mg  q 4 hours prn pain ordered.  Will refer to Ortho as patient feels like she needs further intervention.   6. Right leg cellulitis Rx for Doxycycline 100mg  po BID for 7 days.    7 . Neuropathy -currently on Neurontin, will increase to 300 mg TID at this time.       Carlos American. Harle Battiest  [facility] [phone] 8 am - 5 pm) [phone] (after hours)

## 2015-08-31 LAB — TSH: TSH: 6.21 u[IU]/mL — AB (ref 0.41–5.90)

## 2015-10-05 ENCOUNTER — Non-Acute Institutional Stay (SKILLED_NURSING_FACILITY): Payer: Medicare Other | Admitting: Internal Medicine

## 2015-10-05 ENCOUNTER — Encounter: Payer: Self-pay | Admitting: Internal Medicine

## 2015-10-05 DIAGNOSIS — H6502 Acute serous otitis media, left ear: Secondary | ICD-10-CM

## 2015-10-05 DIAGNOSIS — J432 Centrilobular emphysema: Secondary | ICD-10-CM

## 2015-10-05 NOTE — Progress Notes (Signed)
Patient ID: Dawn Montoya, female   DOB: 1932/09/28, 81 y.o.   MRN: [id number]    DATE: 10/05/15  Location:  [facility]    Place of Service: SNF (31)   Extended Emergency Contact Information Primary Emergency Contact: [place] Phone: [phone] Mobile Phone: [phone] Relation: None  Advanced Directive information Does patient have an advance directive?: Yes, Type of Advance Directive : [place]  Chief Complaint  Patient presents with  . Acute Visit    HPI:  81 yo female long term resident seen today for ear pressure. She reports left hearing loss and L>R ear pressure, worsening x several days. No f/c, ear d/c or tinnitus. No sore throat, HA or dizziness. She has a hx COPD and uses nebulizers and inhalers.  Past Medical History  Diagnosis Date  . COPD (chronic obstructive pulmonary disease) ([place])   . Breast CA ([place])   . Hypertension   . HTN (hypertension) 01/18/2014  . Hypothyroidism 01/18/2014  . Medical history non-contributory   . Shortness of breath   . Pneumonia   . Diastolic CHF ([place]) 99991111  . CKD (chronic kidney disease) stage 3, GFR 30-59 ml/min 12/15/2014  . Anxiety state 02/25/2015    Past Surgical History  Procedure Laterality Date  . Cholecystectomy    . Breast surgery    . Mastectomy Left   . Esophagogastroduodenoscopy N/A 01/19/2014    Procedure: ESOPHAGOGASTRODUODENOSCOPY (EGD);  Surgeon: Jeryl Columbia, MD;  Location: [facility];  Service: Endoscopy;  Laterality: N/A;  . Colonoscopy N/A 01/20/2014    Procedure: COLONOSCOPY;  Surgeon: Winfield Cunas., MD;  Location: [facility];  Service: Endoscopy;  Laterality: N/A;    Patient Care Team: Tammy Arn Medal, MD as PCP - General (Family Medicine)  Social History   Social History  . Marital Status: Unknown    Spouse Name: N/A  . Number of Children: N/A  . Years of Education: N/A   Occupational History  . Not on file.   Social  History Main Topics  . Smoking status: Former Smoker -- 2.00 packs/day for 50 years    Types: Cigarettes    Quit date: 10/25/1999  . Smokeless tobacco: Never Used  . Alcohol Use: No  . Drug Use: No  . Sexual Activity: No   Other Topics Concern  . Not on file   Social History Narrative     reports that she quit smoking about 15 years ago. Her smoking use included Cigarettes. She has a 100 pack-year smoking history. She has never used smokeless tobacco. She reports that she does not drink alcohol or use illicit drugs.  Immunization History  Administered Date(s) Administered  . Influenza Split 12/01/2014  . Influenza,inj,Quad PF,36+ Mos 01/21/2014    No Known Allergies  Medications: Patient's Medications  New Prescriptions   No medications on file  Previous Medications   ACETAMINOPHEN (TYLENOL) 325 MG TABLET    Take 650 mg by mouth every 6 (six) hours as needed. Notify MD if not relieved DO NOT EXCEED 3000 mg in 24 hour period   ALBUTEROL (PROVENTIL HFA;VENTOLIN HFA) 108 (90 BASE) MCG/ACT INHALER    Inhale 2 puffs into the lungs every 6 (six) hours as needed for wheezing or shortness of breath.    ALBUTEROL (PROVENTIL) (2.5 MG/3ML) 0.083% NEBULIZER SOLUTION    Take 2.5 mg by nebulization every 6 (six) hours as needed for wheezing or shortness of breath.   BENZONATATE (TESSALON) 100 MG CAPSULE  Take 1 capsule (100 mg total) by mouth 3 (three) times daily as needed for cough.   BISACODYL (DULCOLAX) 10 MG SUPPOSITORY    Place 10 mg rectally as needed for moderate constipation. If not relieved by Bisacodyl give Saline Enema   BUDESONIDE (PULMICORT) 0.25 MG/2ML NEBULIZER SOLUTION    Take 0.25 mg by nebulization 2 (two) times daily.   CHOLECALCIFEROL (VITAMIN D) 1000 UNITS TABLET    Take 1,000 Units by mouth daily.   DEXTROMETHORPHAN-GUAIFENESIN (MUCINEX DM MAXIMUM STRENGTH) 60-1200 MG TB12    Take by mouth 2 (two) times daily. For cough   DULOXETINE (CYMBALTA) 60 MG CAPSULE    Take 1  capsule (60 mg total) by mouth daily.   FLUTICASONE-SALMETEROL (ADVAIR DISKUS) 500-50 MCG/DOSE AEPB    Inhale 1 puff into the lungs 2 (two) times daily.   FUROSEMIDE (LASIX) 40 MG TABLET    Take 2 tablets (80 mg total) by mouth daily.   GABAPENTIN (NEURONTIN) 100 MG CAPSULE    Take 100 mg by mouth 2 (two) times daily.   GABAPENTIN (NEURONTIN) 300 MG CAPSULE    Take 300 mg by mouth at bedtime.   GUAIFENESIN (ROBITUSSIN) 100 MG/5ML SYRUP    10 cc by mouth every 4 hours as needed x 48 hours for cough   HYDROCHLOROTHIAZIDE (HYDRODIURIL) 25 MG TABLET    Take 25 mg by mouth daily.   LEVOTHYROXINE (SYNTHROID, LEVOTHROID) 75 MCG TABLET    Take 75 mcg by mouth daily before breakfast.   LISINOPRIL (PRINIVIL,ZESTRIL) 10 MG TABLET    Take 1 tablet (10 mg total) by mouth daily.   LORAZEPAM (ATIVAN) 0.5 MG TABLET    Take one tablet by mouth every 8 hours as needed for anxiety   LORAZEPAM (ATIVAN) 1 MG TABLET    Give 1 by mouth prior to CT Scan give at [place] (MILK OF MAGNESIA) 400 MG/5ML SUSPENSION    Take 30 mLs by mouth once. In 24 hours as needed. If not relieved by MOM give Bisacodyl   MECLIZINE (ANTIVERT) 12.5 MG TABLET    Take 12.5 mg by mouth 3 (three) times daily as needed for dizziness.   MELATONIN 3 MG TABS    Take by mouth at bedtime.   MIRABEGRON ER (MYRBETRIQ) 25 MG TB24 TABLET    Take 25 mg by mouth daily.   MULTIPLE VITAMINS-MINERALS (MULTIVITAMIN WITH MINERALS) TABLET    Take 1 tablet by mouth daily.   OXYCODONE (OXY IR/ROXICODONE) 5 MG IMMEDIATE RELEASE TABLET    Take one tablet by mouth every 4 hours as needed for pain   PANTOPRAZOLE (PROTONIX) 40 MG TABLET    Take 1 tablet (40 mg total) by mouth daily at 12 noon.   POLYVINYL ALCOHOL (LIQUIFILM TEARS) 1.4 % OPHTHALMIC SOLUTION    Place 1 drop into both eyes as needed for dry eyes.   SODIUM PHOSPHATES (RA SALINE ENEMA RE)    Place rectally. Rectally x 1 dose/24 hours   TIOTROPIUM (SPIRIVA HANDIHALER) 18 MCG INHALATION  CAPSULE    Place 1 capsule (18 mcg total) into inhaler and inhale daily.  Modified Medications   No medications on file  Discontinued Medications   No medications on file    Review of Systems  HENT: Positive for hearing loss. Negative for congestion, ear discharge, ear pain, postnasal drip, rhinorrhea, sinus pressure, sore throat, tinnitus and trouble swallowing.   All other systems reviewed and are negative.   Filed Vitals:   10/05/15 1541  BP: 138/51  Pulse: 56  Temp: 97.1 F (36.2 C)  TempSrc: Oral  Resp: 19  Weight: 258 lb (117.028 kg)   Body mass index is 44.98 kg/(m^2).  Physical Exam  Constitutional: She appears well-developed and well-nourished. No distress.  Sitting in w/c in NAD. No conversational dyspnea  HENT:  Head: Normocephalic and atraumatic.  Right Ear: External ear normal.  Left Ear: External ear normal.  Right TM intact, increased ceumen, hard but no redness. 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 No cerumen noted. No external ear canal swelling or d/c.   Lymphadenopathy:    She has no cervical adenopathy.  Neurological: She is alert.  Skin: Skin is warm and dry. No rash noted.  Psychiatric: She has a normal mood and affect. Her behavior is normal.     Labs reviewed: No visits with results within 3 Month(s) from this visit. Latest known visit with results is:  Nursing Home on 05/08/2015  Component Date Value Ref Range Status  . Hemoglobin 04/11/2015 12.6  12.0 - 16.0 g/dL Final  . HCT 04/11/2015 37  36 - 46 % Final  . Platelets 04/11/2015 248  150 - 399 K/L Final  . WBC 04/11/2015 11.8   Final  . Glucose 02/28/2015 109   Final  . BUN 02/28/2015 20  4 - 21 mg/dL Final  . Creatinine 02/28/2015 0.8  0.5 - 1.1 mg/dL Final  . Potassium 02/28/2015 4.4  3.4 - 5.3 mmol/L Final  . Sodium 02/28/2015 139  137 - 147 mmol/L Final    No results found.   Assessment/Plan   ICD-9-CM ICD-10-CM   1. Acute serous otitis media of  left ear, recurrence not specified 381.01 H65.02   2. Centrilobular emphysema (HCC) - stable 492.8 J43.2     omnicef 300mg  po q12hr x 7 days  floraster po BID x 2 weeks  Cont other meds as ordered  Will follow  Osamah Schmader S. Perlie Gold  [facility] [address] [place], [place] [address] [phone] Cell (Monday-Friday 8 AM - 5 PM) [phone] After 5 PM and follow prompts

## 2015-11-02 LAB — TSH: TSH: 3.14 u[IU]/mL (ref 0.41–5.90)

## 2015-11-06 ENCOUNTER — Non-Acute Institutional Stay (SKILLED_NURSING_FACILITY): Payer: Medicare Other | Admitting: Nurse Practitioner

## 2015-11-06 ENCOUNTER — Encounter: Payer: Self-pay | Admitting: Nurse Practitioner

## 2015-11-06 DIAGNOSIS — D649 Anemia, unspecified: Secondary | ICD-10-CM | POA: Diagnosis not present

## 2015-11-06 DIAGNOSIS — J432 Centrilobular emphysema: Secondary | ICD-10-CM

## 2015-11-06 DIAGNOSIS — I5032 Chronic diastolic (congestive) heart failure: Secondary | ICD-10-CM | POA: Diagnosis not present

## 2015-11-06 DIAGNOSIS — I10 Essential (primary) hypertension: Secondary | ICD-10-CM | POA: Diagnosis not present

## 2015-11-06 DIAGNOSIS — R32 Unspecified urinary incontinence: Secondary | ICD-10-CM

## 2015-11-06 DIAGNOSIS — N393 Stress incontinence (female) (male): Secondary | ICD-10-CM

## 2015-11-06 DIAGNOSIS — N183 Chronic kidney disease, stage 3 unspecified: Secondary | ICD-10-CM

## 2015-11-06 NOTE — Progress Notes (Signed)
Nursing Home Location:  [facility]   Place of Service: SNF (31)  PCP: Bartholome Bill, MD  No Known Allergies  Chief Complaint  Patient presents with  . Medical Management of Chronic Issues    HPI:  Patient is a 82 y.o. female seen today at [facility] for routine follow up on chronic condition. Pt with past medical history of COPD, HTN, hypothyroidism, CHF, and CKD. Pt reports she has been doing well in the last month. No acute changes. Notes that myrebertiq has not helped urinary symptoms. Reports ongoing neuropathy to hands, was started on gabapentin which was increased to 300 mg TID last month, unsure if this has helped much, still having symptoms however still able to self propel in [facility] and participate in activities.   Review of Systems:  Review of Systems  Constitutional: Negative for fever, chills and fatigue.  HENT: Negative for congestion, postnasal drip and rhinorrhea.   Respiratory: Negative for cough, shortness of breath and wheezing.   Cardiovascular: Negative for chest pain, palpitations and leg swelling.  Gastrointestinal: Negative for abdominal pain, diarrhea and constipation.  Genitourinary: Positive for frequency. Negative for dysuria, hematuria and flank pain.  Musculoskeletal: Positive for joint swelling (in left knee). Negative for myalgias, back pain and arthralgias.  Neurological: Positive for numbness (to bilateral upper extermities ). Negative for dizziness and light-headedness.    Past Medical History  Diagnosis Date  . COPD (chronic obstructive pulmonary disease) ([place])   . Breast CA ([place])   . Hypertension   . HTN (hypertension) 01/18/2014  . Hypothyroidism 01/18/2014  . Medical history non-contributory   . Shortness of breath   . Pneumonia   . Diastolic CHF ([place]) 99991111  . CKD (chronic kidney disease) stage 3, GFR 30-59 ml/min 12/15/2014  . Anxiety state 02/25/2015   Past Surgical History  Procedure  Laterality Date  . Cholecystectomy    . Breast surgery    . Mastectomy Left   . Esophagogastroduodenoscopy N/A 01/19/2014    Procedure: ESOPHAGOGASTRODUODENOSCOPY (EGD);  Surgeon: Jeryl Columbia, MD;  Location: [facility];  Service: Endoscopy;  Laterality: N/A;  . Colonoscopy N/A 01/20/2014    Procedure: COLONOSCOPY;  Surgeon: Winfield Cunas., MD;  Location: [facility];  Service: Endoscopy;  Laterality: N/A;   Social History:   reports that she quit smoking about 16 years ago. Her smoking use included Cigarettes. She has a 100 pack-year smoking history. She has never used smokeless tobacco. She reports that she does not drink alcohol or use illicit drugs.  Family History  Problem Relation Age of Onset  . Lung cancer Sister   . Breast cancer Sister   . Breast cancer Daughter     Medications: Patient's Medications  New Prescriptions   No medications on file  Previous Medications   ACETAMINOPHEN (TYLENOL) 325 MG TABLET    Take 650 mg by mouth every 6 (six) hours as needed. Notify MD if not relieved DO NOT EXCEED 3000 mg in 24 hour period   ALBUTEROL (PROVENTIL HFA;VENTOLIN HFA) 108 (90 BASE) MCG/ACT INHALER    Inhale 2 puffs into the lungs every 6 (six) hours as needed for wheezing or shortness of breath.    ALBUTEROL (PROVENTIL) (2.5 MG/3ML) 0.083% NEBULIZER SOLUTION    Take 3 mLs (2.5 mg total) by nebulization every 6 (six) hours as needed for wheezing or shortness of breath.   BENZONATATE (TESSALON) 100 MG CAPSULE    Take 1 capsule (100 mg  total) by mouth 3 (three) times daily as needed for cough.   BISACODYL (DULCOLAX) 10 MG SUPPOSITORY    Place 10 mg rectally as needed for moderate constipation. If not relieved by Bisacodyl give Saline Enema   BUDESONIDE (PULMICORT) 0.25 MG/2ML NEBULIZER SOLUTION    Take 0.25 mg by nebulization 2 (two) times daily.   CHOLECALCIFEROL (VITAMIN D) 1000 UNITS TABLET    Take 1,000 Units by mouth daily.   DEXTROMETHORPHAN-GUAIFENESIN (MUCINEX DM  MAXIMUM STRENGTH) 60-1200 MG TB12    Take by mouth 2 (two) times daily. For cough   DULOXETINE (CYMBALTA) 60 MG CAPSULE    Take 1 capsule (60 mg total) by mouth daily.   FLUTICASONE-SALMETEROL (ADVAIR) 100-50 MCG/DOSE AEPB    Inhale one puff into the lungs two times  for COPD   FUROSEMIDE (LASIX) 40 MG TABLET    Take 2 tablets (80 mg total) by mouth daily.   GABAPENTIN (NEURONTIN) 300 MG CAPSULE    Take 300 mg by mouth at bedtime.   HYDROCHLOROTHIAZIDE (HYDRODIURIL) 25 MG TABLET    Take 25 mg by mouth daily.   LEVOTHYROXINE (SYNTHROID, LEVOTHROID) 75 MCG TABLET    Take 75 mcg by mouth daily before breakfast.   LISINOPRIL (PRINIVIL,ZESTRIL) 10 MG TABLET    Take 1 tablet (10 mg total) by mouth daily.   LORAZEPAM (ATIVAN) 0.5 MG TABLET    Take one tablet by mouth every 8 hours as needed for anxiety   MAGNESIUM HYDROXIDE (MILK OF MAGNESIA) 400 MG/5ML SUSPENSION    Take 30 mLs by mouth once. In 24 hours as needed. If not relieved by MOM give Bisacodyl   MECLIZINE (ANTIVERT) 12.5 MG TABLET    Take 12.5 mg by mouth 3 (three) times daily as needed for dizziness.   MELATONIN 3 MG TABS    Take by mouth at bedtime.   MULTIPLE VITAMINS-MINERALS (MULTIVITAMIN WITH MINERALS) TABLET    Take 1 tablet by mouth daily.   OXYCODONE (OXY IR/ROXICODONE) 5 MG IMMEDIATE RELEASE TABLET    Take one tablet by mouth every 4 hours as needed for pain   PANTOPRAZOLE (PROTONIX) 40 MG TABLET    Take 1 tablet (40 mg total) by mouth daily at 12 noon.   POLYVINYL ALCOHOL (LIQUIFILM TEARS) 1.4 % OPHTHALMIC SOLUTION    Place 1 drop into both eyes as needed for dry eyes.   SODIUM PHOSPHATES (RA SALINE ENEMA RE)    Place rectally. Rectally x 1 dose/24 hours   TIOTROPIUM (SPIRIVA HANDIHALER) 18 MCG INHALATION CAPSULE    Place 1 capsule (18 mcg total) into inhaler and inhale daily.  Modified Medications   No medications on file  Discontinued Medications   ALBUTEROL (PROVENTIL) (2.5 MG/3ML) 0.083% NEBULIZER SOLUTION    Take 2.5 mg by  nebulization every 6 (six) hours as needed for wheezing or shortness of breath.   MIRABEGRON ER (MYRBETRIQ) 25 MG TB24 TABLET    Take 25 mg by mouth daily.     Physical Exam: Filed Vitals:   11/06/15 1022  BP: 138/51  Pulse: 56  Temp: 97.1 F (36.2 C)  TempSrc: Oral  Resp: 19  Weight: 256 lb 3.2 oz (116.212 kg)    Physical Exam  Constitutional: She is oriented to person, place, and time. She appears well-developed and well-nourished. No distress.  HENT:  Head: Normocephalic and atraumatic.  Mouth/Throat: Oropharynx is clear and moist. No oropharyngeal exudate.  Eyes: Pupils are equal, round, and reactive to light.  Neck: Normal range of motion.  Neck supple.  Cardiovascular: Normal rate, regular rhythm and normal heart sounds.   No murmur heard. Pulmonary/Chest: Effort normal and breath sounds normal. She has no wheezes.  Abdominal: Soft. Bowel sounds are normal. She exhibits no distension.  Lymphadenopathy:    She has no cervical adenopathy.  Neurological: She is alert and oriented to person, place, and time.  Skin: Skin is warm and dry. She is not diaphoretic.  Psychiatric: She has a normal mood and affect.    Labs reviewed: Basic Metabolic Panel:  Recent Labs  11/27/14 02/28/15  NA 137 139  K 4.0 4.4  BUN 25* 20  CREATININE 0.9 0.8   Liver Function Tests: No results for input(s): AST, ALT, ALKPHOS, BILITOT, PROT, ALBUMIN in the last 8760 hours. No results for input(s): LIPASE, AMYLASE in the last 8760 hours. No results for input(s): AMMONIA in the last 8760 hours. CBC:  Recent Labs  04/11/15  WBC 11.8  HGB 12.6  HCT 37  PLT 248   TSH: No results for input(s): TSH in the last 8760 hours. A1C: Lab Results  Component Value Date   HGBA1C 6.5* 10/26/2014   Lipid Panel: No results for input(s): CHOL, HDL, LDLCALC, TRIG, CHOLHDL, LDLDIRECT in the last 8760 hours.   Assessment/Plan 1. Incontinence in female Without benefit on myrbetriq, will Dc at this  time. Pt to notify if symptoms worsen once off medication   2. Essential hypertension -blood pressure stable, cont on lisinopril, HCTZ and lasix. Will follow up labs at thist ime.   3. Centrilobular emphysema ([place]) -COPD remains stable, cont on current nebs  4. CKD (chronic kidney disease) stage 3, GFR 30-59 ml/min -will follow up CMP and CBC  5. Anemia, unspecified anemia type Will follow up CBC  6. Chronic diastolic congestive heart failure (HCC) -euvolemic, conts on lasix 80 mg daily, will follow up bmp at this time    Dawn Montoya K. Harle Battiest  [facility] [phone] 8 am - 5 pm) [phone] (after hours)

## 2015-11-10 LAB — CBC AND DIFFERENTIAL
HEMATOCRIT: 38 % (ref 36–46)
HEMOGLOBIN: 12 g/dL (ref 12.0–16.0)
Platelets: 262 10*3/uL (ref 150–399)
WBC: 8.1 10^3/mL

## 2015-11-10 LAB — BASIC METABOLIC PANEL
BUN: 17 mg/dL (ref 4–21)
Creatinine: 0.8 mg/dL (ref 0.5–1.1)
Glucose: 158 mg/dL
POTASSIUM: 4.2 mmol/L (ref 3.4–5.3)
SODIUM: 137 mmol/L (ref 137–147)

## 2015-11-10 LAB — HEPATIC FUNCTION PANEL
ALK PHOS: 61 U/L (ref 25–125)
ALT: 16 U/L (ref 7–35)
AST: 18 U/L (ref 13–35)
BILIRUBIN, TOTAL: 0.4 mg/dL

## 2015-11-10 LAB — TSH: TSH: 4.26 u[IU]/mL (ref <=5.90)

## 2015-12-07 ENCOUNTER — Encounter: Payer: Self-pay | Admitting: Nurse Practitioner

## 2015-12-07 ENCOUNTER — Non-Acute Institutional Stay (SKILLED_NURSING_FACILITY): Payer: Medicare Other | Admitting: Nurse Practitioner

## 2015-12-07 DIAGNOSIS — E034 Atrophy of thyroid (acquired): Secondary | ICD-10-CM | POA: Diagnosis not present

## 2015-12-07 DIAGNOSIS — M4712 Other spondylosis with myelopathy, cervical region: Secondary | ICD-10-CM | POA: Diagnosis not present

## 2015-12-07 DIAGNOSIS — N183 Chronic kidney disease, stage 3 unspecified: Secondary | ICD-10-CM

## 2015-12-07 DIAGNOSIS — E038 Other specified hypothyroidism: Secondary | ICD-10-CM | POA: Diagnosis not present

## 2015-12-07 DIAGNOSIS — G959 Disease of spinal cord, unspecified: Secondary | ICD-10-CM

## 2015-12-07 DIAGNOSIS — I5032 Chronic diastolic (congestive) heart failure: Secondary | ICD-10-CM | POA: Diagnosis not present

## 2015-12-07 DIAGNOSIS — J432 Centrilobular emphysema: Secondary | ICD-10-CM

## 2015-12-07 DIAGNOSIS — I10 Essential (primary) hypertension: Secondary | ICD-10-CM

## 2015-12-07 DIAGNOSIS — F411 Generalized anxiety disorder: Secondary | ICD-10-CM

## 2015-12-07 DIAGNOSIS — M5412 Radiculopathy, cervical region: Secondary | ICD-10-CM

## 2015-12-07 NOTE — Progress Notes (Signed)
Nursing Home Location:  [facility]   Place of Service: SNF (31)  PCP: Bartholome Bill, MD  No Known Allergies  Chief Complaint  Patient presents with  . Medical Management of Chronic Issues    Routine Visit    HPI:  Patient is a 82 y.o. female seen today at [facility] for routine follow up on chronic condition. Pt with past medical history of COPD, HTN, hypothyroidism, CHF, and CKD. Last month myrbetriq was stopped, pt without changes in urinary symptoms. Reports worsening pain to hands and decrease ROM. Ongoing neuropathy. No weakness noted. Pt reports bowels moving well. No constipation or diarrhea. No shortness of breath or chest pains noted. Mood has been stable.   Review of Systems:  Review of Systems  Constitutional: Negative for fever, chills and fatigue.  HENT: Negative for congestion, postnasal drip and rhinorrhea.   Respiratory: Negative for cough, shortness of breath and wheezing.   Cardiovascular: Negative for chest pain, palpitations and leg swelling.  Gastrointestinal: Negative for abdominal pain, diarrhea and constipation.  Genitourinary: Positive for frequency. Negative for dysuria, hematuria and flank pain.  Musculoskeletal: Negative for myalgias, back pain and arthralgias.  Neurological: Positive for numbness (to bilateral upper extermities ). Negative for dizziness and light-headedness.    Past Medical History  Diagnosis Date  . COPD (chronic obstructive pulmonary disease) ([place])   . Breast CA ([place])   . Hypertension   . HTN (hypertension) 01/18/2014  . Hypothyroidism 01/18/2014  . Medical history non-contributory   . Shortness of breath   . Pneumonia   . Diastolic CHF ([place]) 99991111  . CKD (chronic kidney disease) stage 3, GFR 30-59 ml/min 12/15/2014  . Anxiety state 02/25/2015   Past Surgical History  Procedure Laterality Date  . Cholecystectomy    . Breast surgery    . Mastectomy Left   .  Esophagogastroduodenoscopy N/A 01/19/2014    Procedure: ESOPHAGOGASTRODUODENOSCOPY (EGD);  Surgeon: Jeryl Columbia, MD;  Location: [facility];  Service: Endoscopy;  Laterality: N/A;  . Colonoscopy N/A 01/20/2014    Procedure: COLONOSCOPY;  Surgeon: Winfield Cunas., MD;  Location: [facility];  Service: Endoscopy;  Laterality: N/A;   Social History:   reports that she quit smoking about 16 years ago. Her smoking use included Cigarettes. She has a 100 pack-year smoking history. She has never used smokeless tobacco. She reports that she does not drink alcohol or use illicit drugs.  Family History  Problem Relation Age of Onset  . Lung cancer Sister   . Breast cancer Sister   . Breast cancer Daughter     Medications: Patient's Medications  New Prescriptions   No medications on file  Previous Medications   ACETAMINOPHEN (TYLENOL) 325 MG TABLET    Take 650 mg by mouth every 6 (six) hours as needed. Notify MD if not relieved DO NOT EXCEED 3000 mg in 24 hour period   ALBUTEROL (PROVENTIL HFA;VENTOLIN HFA) 108 (90 BASE) MCG/ACT INHALER    Inhale 2 puffs into the lungs every 6 (six) hours as needed for wheezing or shortness of breath.    ALBUTEROL (PROVENTIL) (2.5 MG/3ML) 0.083% NEBULIZER SOLUTION    Take 3 mLs (2.5 mg total) by nebulization every 6 (six) hours as needed for wheezing or shortness of breath.   BENZONATATE (TESSALON) 100 MG CAPSULE    Take 1 capsule (100 mg total) by mouth 3 (three) times daily as needed for cough.   BISACODYL (DULCOLAX) 10 MG SUPPOSITORY  Place 10 mg rectally as needed for moderate constipation. If not relieved by Bisacodyl give Saline Enema   BUDESONIDE (PULMICORT) 0.25 MG/2ML NEBULIZER SOLUTION    Take 0.25 mg by nebulization 2 (two) times daily.   CHOLECALCIFEROL (VITAMIN D) 1000 UNITS TABLET    Take 1,000 Units by mouth daily.   DEXTROMETHORPHAN-GUAIFENESIN (MUCINEX DM MAXIMUM STRENGTH) 60-1200 MG TB12    Take by mouth 2 (two) times daily. For cough    DULOXETINE (CYMBALTA) 60 MG CAPSULE    Take 1 capsule (60 mg total) by mouth daily.   FLUTICASONE-SALMETEROL (ADVAIR) 100-50 MCG/DOSE AEPB    Inhale one puff into the lungs two times  for COPD   FUROSEMIDE (LASIX) 40 MG TABLET    Take 2 tablets (80 mg total) by mouth daily.   GABAPENTIN (NEURONTIN) 300 MG CAPSULE    Take 300 mg by mouth at bedtime.   HYDROCHLOROTHIAZIDE (HYDRODIURIL) 25 MG TABLET    Take 25 mg by mouth daily.   LEVOTHYROXINE (SYNTHROID, LEVOTHROID) 75 MCG TABLET    Take 75 mcg by mouth daily before breakfast.   LISINOPRIL (PRINIVIL,ZESTRIL) 10 MG TABLET    Take 1 tablet (10 mg total) by mouth daily.   LORAZEPAM (ATIVAN) 0.5 MG TABLET    Take one tablet by mouth every 8 hours as needed for anxiety   MAGNESIUM HYDROXIDE (MILK OF MAGNESIA) 400 MG/5ML SUSPENSION    Take 30 mLs by mouth once. In 24 hours as needed. If not relieved by MOM give Bisacodyl   MECLIZINE (ANTIVERT) 12.5 MG TABLET    Take 12.5 mg by mouth 3 (three) times daily as needed for dizziness.   MELATONIN 3 MG TABS    Take by mouth at bedtime.   MULTIPLE VITAMINS-MINERALS (MULTIVITAMIN WITH MINERALS) TABLET    Take 1 tablet by mouth daily.   OXYCODONE (OXY IR/ROXICODONE) 5 MG IMMEDIATE RELEASE TABLET    Take one tablet by mouth every 4 hours as needed for pain   PANTOPRAZOLE (PROTONIX) 40 MG TABLET    Take 1 tablet (40 mg total) by mouth daily at 12 noon.   POLYVINYL ALCOHOL (LIQUIFILM TEARS) 1.4 % OPHTHALMIC SOLUTION    Place 1 drop into both eyes as needed for dry eyes.   SODIUM PHOSPHATES (RA SALINE ENEMA RE)    Place rectally. Rectally x 1 dose/24 hours   TIOTROPIUM (SPIRIVA HANDIHALER) 18 MCG INHALATION CAPSULE    Place 1 capsule (18 mcg total) into inhaler and inhale daily.  Modified Medications   No medications on file  Discontinued Medications   No medications on file     Physical Exam: Filed Vitals:   12/07/15 0926  BP: 128/77  Pulse: 52  Temp: 97.3 F (36.3 C)  TempSrc: Oral  Resp: 17  Height:  5\' 3"  (1.6 m)  Weight: 257 lb 3.2 oz (116.665 kg)    Physical Exam  Constitutional: She is oriented to person, place, and time. She appears well-developed and well-nourished. No distress.  HENT:  Head: Normocephalic and atraumatic.  Mouth/Throat: Oropharynx is clear and moist. No oropharyngeal exudate.  Eyes: Pupils are equal, round, and reactive to light.  Neck: Normal range of motion. Neck supple.  Cardiovascular: Normal rate, regular rhythm and normal heart sounds.   No murmur heard. Pulmonary/Chest: Effort normal and breath sounds normal. She has no wheezes.  Abdominal: Soft. Bowel sounds are normal. She exhibits no distension.  Lymphadenopathy:    She has no cervical adenopathy.  Neurological: She is alert and oriented  to person, place, and time.  Skin: Skin is warm and dry. She is not diaphoretic.  Psychiatric: She has a normal mood and affect.    Labs reviewed: Basic Metabolic Panel:  Recent Labs  02/28/15 11/10/15  NA 139 137  K 4.4 4.2  BUN 20 17  CREATININE 0.8 0.8   Liver Function Tests:  Recent Labs  11/10/15  AST 18  ALT 16  ALKPHOS 61   No results for input(s): LIPASE, AMYLASE in the last 8760 hours. No results for input(s): AMMONIA in the last 8760 hours. CBC:  Recent Labs  04/11/15 11/10/15  WBC 11.8 8.1  HGB 12.6 12.0  HCT 37 38  PLT 248 262   TSH:  Recent Labs  08/31/15 11/02/15 11/10/15  TSH 6.21* 3.14 4.26   A1C: Lab Results  Component Value Date   HGBA1C 6.5* 10/26/2014   Lipid Panel: No results for input(s): CHOL, HDL, LDLCALC, TRIG, CHOLHDL, LDLDIRECT in the last 8760 hours. IMPRESSION:  Abnormal MRI cervical spine (without) demonstrating: 1. At C6-7: disc bulging and facet hypertrophy with moderate-severe spinal stenosis, moderate right and severe left foraminal stenosis  2. At C5-6: disc bulging and facet hypertrophy with moderate-severe spinal stenosis and moderate biforaminal stenosis  3. At C4-5: uncovertebral joint  hypertrophy with mild biforaminal stenosis  4. At C3-4: uncovertebral joint hypertrophy with mild left foraminal stenosis  5. Patient motion artifact limits this study. Consider repeat scan with sedation if indicated.  Assessment/Plan 1. Diastolic CHF, chronic (HCC) Evuolemic, pt refusing lasix most days due to increase urination, explained need for lasix to pt. No LE edema, chest pains, shortness of breath noted. Will cont current medication.   2. Essential hypertension Blood pressure controlled on current medication, will cont current regimen  3. Centrilobular emphysema (HCC) COPD remains stable, conts on chronic 02 and nebs  4. Cervical myelopathy with cervical radiculopathy Worsening pain to bilateral hands, MRI by neurology. Has been placed on gabapentin without much benefit. Pt needs follow up with neurologist, will order at this time -OT to eval and treat  5. CKD (chronic kidney disease) stage 3, GFR 30-59 ml/min BUN and Cr stable on recent labs, avoid dehydration   6. Anxiety state Stable, conts on cymbalta 60 mg daily   7. Hypothyroidism due to acquired atrophy of thyroid -TSH remain stable at 4.26, will cont synthroid 75 mcgs  Mazel Villela K. Harle Battiest  [facility] [phone] 8 am - 5 pm) [phone] (after hours)

## 2015-12-07 NOTE — Progress Notes (Signed)
This encounter was created in error - please disregard.

## 2015-12-08 LAB — LIPID PANEL
CHOLESTEROL: 160 mg/dL (ref 0–200)
HDL: 32 mg/dL — AB (ref 35–70)
LDL CALC: 92 mg/dL
TRIGLYCERIDES: 181 mg/dL — AB (ref 40–160)

## 2016-01-07 ENCOUNTER — Other Ambulatory Visit: Payer: Self-pay

## 2016-01-07 MED ORDER — OXYCODONE HCL 5 MG PO TABS: ORAL_TABLET | ORAL | Status: DC

## 2016-01-07 NOTE — Telephone Encounter (Signed)
Rx Fax to [facility] at [phone], phone number [phone]. 

## 2016-01-19 ENCOUNTER — Non-Acute Institutional Stay (SKILLED_NURSING_FACILITY): Payer: Medicare Other | Admitting: Adult Health

## 2016-01-19 ENCOUNTER — Encounter: Payer: Self-pay | Admitting: Adult Health

## 2016-01-19 DIAGNOSIS — J432 Centrilobular emphysema: Secondary | ICD-10-CM | POA: Diagnosis not present

## 2016-01-19 DIAGNOSIS — M4712 Other spondylosis with myelopathy, cervical region: Secondary | ICD-10-CM | POA: Diagnosis not present

## 2016-01-19 DIAGNOSIS — E034 Atrophy of thyroid (acquired): Secondary | ICD-10-CM

## 2016-01-19 DIAGNOSIS — K219 Gastro-esophageal reflux disease without esophagitis: Secondary | ICD-10-CM | POA: Diagnosis not present

## 2016-01-19 DIAGNOSIS — F411 Generalized anxiety disorder: Secondary | ICD-10-CM | POA: Diagnosis not present

## 2016-01-19 DIAGNOSIS — I10 Essential (primary) hypertension: Secondary | ICD-10-CM | POA: Diagnosis not present

## 2016-01-19 DIAGNOSIS — E038 Other specified hypothyroidism: Secondary | ICD-10-CM

## 2016-01-19 DIAGNOSIS — M5412 Radiculopathy, cervical region: Secondary | ICD-10-CM

## 2016-01-19 DIAGNOSIS — I5032 Chronic diastolic (congestive) heart failure: Secondary | ICD-10-CM | POA: Diagnosis not present

## 2016-01-19 HISTORY — DX: Gastro-esophageal reflux disease without esophagitis: K21.9

## 2016-01-19 NOTE — Progress Notes (Signed)
Patient ID: Dawn Montoya, female   DOB: 1931-11-28, 82 y.o.   MRN: [id number]   Facility: [facility]     FULL CODE  No Known Allergies  Chief Complaint  Patient presents with  . Medical Management of Chronic Issues    Follow up    HPI:  She is a long term resident of this facility being seen for the management of her chronic illnesses. Overall her status has remained stable. She is not voicing any complaints or concerns today stating that she is feeling good. There are no nursing concerns at this time.    Past Medical History  Diagnosis Date  . COPD (chronic obstructive pulmonary disease) ([place])   . Breast CA ([place])   . Hypertension   . HTN (hypertension) 01/18/2014  . Hypothyroidism 01/18/2014  . Medical history non-contributory   . Shortness of breath   . Pneumonia   . Diastolic CHF ([place]) 99991111  . CKD (chronic kidney disease) stage 3, GFR 30-59 ml/min 12/15/2014  . Anxiety state 02/25/2015    Past Surgical History  Procedure Laterality Date  . Cholecystectomy    . Breast surgery    . Mastectomy Left   . Esophagogastroduodenoscopy N/A 01/19/2014    Procedure: ESOPHAGOGASTRODUODENOSCOPY (EGD);  Surgeon: Jeryl Columbia, MD;  Location: [facility];  Service: Endoscopy;  Laterality: N/A;  . Colonoscopy N/A 01/20/2014    Procedure: COLONOSCOPY;  Surgeon: Winfield Cunas., MD;  Location: [facility];  Service: Endoscopy;  Laterality: N/A;    VITAL SIGNS BP 125/79 mmHg  Pulse 68  Temp(Src) 97.4 F (36.3 C) (Oral)  Resp 19  Ht 5\' 3"  (1.6 m)  Wt 255 lb 6 oz (115.837 kg)  BMI 45.25 kg/m2  Patient's Medications  New Prescriptions   No medications on file  Previous Medications   ACETAMINOPHEN (TYLENOL) 325 MG TABLET    Take 650 mg by mouth every 6 (six) hours as needed. Notify MD if not relieved DO NOT EXCEED 3000 mg in 24 hour period   ALBUTEROL (PROVENTIL HFA;VENTOLIN HFA) 108 (90 BASE) MCG/ACT INHALER    Inhale 2 puffs into the lungs every 6 (six) hours as needed  for wheezing or shortness of breath.    ALBUTEROL (PROVENTIL) (2.5 MG/3ML) 0.083% NEBULIZER SOLUTION    Take 3 mLs (2.5 mg total) by nebulization every 6 (six) hours as needed for wheezing or shortness of breath.   BENZONATATE (TESSALON) 100 MG CAPSULE    Take 1 capsule (100 mg total) by mouth 3 (three) times daily as needed for cough.   BISACODYL (DULCOLAX) 10 MG SUPPOSITORY    Place 10 mg rectally as needed for moderate constipation. If not relieved by Bisacodyl give Saline Enema   BUDESONIDE (PULMICORT) 0.25 MG/2ML NEBULIZER SOLUTION    Take 0.25 mg by nebulization 2 (two) times daily.   CHOLECALCIFEROL (VITAMIN D) 1000 UNITS TABLET    Take 1,000 Units by mouth daily.   DEXTROMETHORPHAN-GUAIFENESIN (MUCINEX DM MAXIMUM STRENGTH) 60-1200 MG TB12    Take 1 capsule by mouth 2 (two) times daily. For cough   DULOXETINE (CYMBALTA) 60 MG CAPSULE    Take 1 capsule (60 mg total) by mouth daily.   FLUTICASONE-SALMETEROL (ADVAIR) 100-50 MCG/DOSE AEPB    Inhale one puff into the lungs two times  for COPD   FUROSEMIDE (LASIX) 40 MG TABLET    Take 2 tablets (80 mg total) by mouth daily.   GABAPENTIN (NEURONTIN) 300 MG CAPSULE    Take 300 mg by mouth at  bedtime.   HYDROCHLOROTHIAZIDE (HYDRODIURIL) 25 MG TABLET    Take 25 mg by mouth daily.   LEVOTHYROXINE (SYNTHROID, LEVOTHROID) 75 MCG TABLET    Take 75 mcg by mouth daily before breakfast.   LISINOPRIL (PRINIVIL,ZESTRIL) 10 MG TABLET    Take 1 tablet (10 mg total) by mouth daily.   LORAZEPAM (ATIVAN) 0.5 MG TABLET    Take one tablet by mouth every 8 hours as needed for anxiety   MAGNESIUM HYDROXIDE (MILK OF MAGNESIA) 400 MG/5ML SUSPENSION    Take 30 mLs by mouth once. In 24 hours as needed. If not relieved by MOM give Bisacodyl   MECLIZINE (ANTIVERT) 12.5 MG TABLET    Take 12.5 mg by mouth every 6 (six) hours as needed for dizziness.    MELATONIN 3 MG TABS    Take by mouth at bedtime.   MULTIPLE VITAMINS-MINERALS (MULTIVITAMIN WITH MINERALS) TABLET    Take 1  tablet by mouth daily.   OXYCODONE (OXY IR/ROXICODONE) 5 MG IMMEDIATE RELEASE TABLET    Take one tablet by mouth every 4 hours as needed for pain   PANTOPRAZOLE (PROTONIX) 40 MG TABLET    Take 1 tablet (40 mg total) by mouth daily at 12 noon.   POLYVINYL ALCOHOL (LIQUIFILM TEARS) 1.4 % OPHTHALMIC SOLUTION    Place 1 drop into both eyes as needed for dry eyes.   SODIUM PHOSPHATES (RA SALINE ENEMA RE)    Place rectally. Rectally x 1 dose/24 hours   TIOTROPIUM (SPIRIVA HANDIHALER) 18 MCG INHALATION CAPSULE    Place 1 capsule (18 mcg total) into inhaler and inhale daily.  Modified Medications   No medications on file  Discontinued Medications   No medications on file     SIGNIFICANT DIAGNOSTIC EXAMS   LABS REVIEWED:   11-10-15: wbc 8.1; hgb 12.0; hct 37.7; mcv 93.7 ;plt 262; glucose 158; bun 17; creat 0.8; k+ 4.2;na++137; liver normal albumin 3.7; tsh 4.26 12-08-15: chol 160; ldl 93; trig 181; hdl 32     Review of Systems  Constitutional: Negative for malaise/fatigue.  Respiratory: Negative for cough and shortness of breath.   Cardiovascular: Negative for chest pain, palpitations and leg swelling.  Gastrointestinal: Negative for heartburn, abdominal pain and constipation.  Musculoskeletal: Negative for myalgias, back pain and joint pain.  Skin: Negative.   Neurological: Negative for dizziness.  Psychiatric/Behavioral: The patient is not nervous/anxious.     Physical Exam  Constitutional: She is oriented to person, place, and time. She appears well-developed and well-nourished. No distress.  Obese   Eyes: Conjunctivae are normal.  Neck: Neck supple. No JVD present. No thyromegaly present.  Cardiovascular: Normal rate, regular rhythm and intact distal pulses.   Respiratory: Effort normal and breath sounds normal. No respiratory distress. She has no wheezes.  GI: Soft. Bowel sounds are normal. She exhibits no distension. There is no tenderness.  Musculoskeletal: She exhibits no  edema.  Able to move all extremities   Lymphadenopathy:    She has no cervical adenopathy.  Neurological: She is alert and oriented to person, place, and time.  Skin: Skin is warm and dry. She is not diaphoretic.  Psychiatric: She has a normal mood and affect.      ASSESSMENT/ PLAN:  1. COPD: will continue pulmicort neb treatment twice daily; advair 100/50 twice daily; spiriva 18 mcg daily mucinex dm twice daily  has albuterol 2 puffs or neb treatment every 6 hours as needed; has tessalon 100 mg three times daily as needed   2.  Hypertension: will continue hctz 25 mg daily will continue lisinopril 10 mg daily   3. Gerd: will continue protonix 40 mg daily   4. Hypothyroidism: will continue synthroid 75 mcg daily tsh is 4.26  5. Diastolic heart failure: will begin lasix 80 mg daily   6. Cervical myelopathy with cervical radiculopathy: will continue neurontin 300 mg nightly; and cymbalta 60 mg daily has oxycodone 5 mg every 4 hours as needed   7. Anxiety: will continue cymbalta 60 mg daily and has ativan 0.5 mg three times daily as needed takes melatonin 3 mg nightly    Time spent with patient 30   minutes >50% time spent counseling; reviewing medical record; tests; labs; and developing future plan of care     Ok Edwards NP [facility]  Contact [phone] Monday through Friday 8am- 5pm  After hours call [phone]

## 2016-01-25 ENCOUNTER — Ambulatory Visit: Payer: Medicare Other | Admitting: Diagnostic Neuroimaging

## 2016-01-26 ENCOUNTER — Encounter: Payer: Self-pay | Admitting: Diagnostic Neuroimaging

## 2016-02-15 ENCOUNTER — Encounter: Payer: Self-pay | Admitting: Internal Medicine

## 2016-02-15 ENCOUNTER — Non-Acute Institutional Stay (SKILLED_NURSING_FACILITY): Payer: Medicare Other | Admitting: Internal Medicine

## 2016-02-15 DIAGNOSIS — M4712 Other spondylosis with myelopathy, cervical region: Secondary | ICD-10-CM | POA: Diagnosis not present

## 2016-02-15 DIAGNOSIS — E038 Other specified hypothyroidism: Secondary | ICD-10-CM

## 2016-02-15 DIAGNOSIS — G629 Polyneuropathy, unspecified: Secondary | ICD-10-CM | POA: Diagnosis not present

## 2016-02-15 DIAGNOSIS — M5412 Radiculopathy, cervical region: Secondary | ICD-10-CM

## 2016-02-15 DIAGNOSIS — I5032 Chronic diastolic (congestive) heart failure: Secondary | ICD-10-CM

## 2016-02-15 DIAGNOSIS — F411 Generalized anxiety disorder: Secondary | ICD-10-CM | POA: Diagnosis not present

## 2016-02-15 DIAGNOSIS — I10 Essential (primary) hypertension: Secondary | ICD-10-CM | POA: Diagnosis not present

## 2016-02-15 DIAGNOSIS — J449 Chronic obstructive pulmonary disease, unspecified: Secondary | ICD-10-CM | POA: Diagnosis not present

## 2016-02-15 DIAGNOSIS — E034 Atrophy of thyroid (acquired): Secondary | ICD-10-CM | POA: Diagnosis not present

## 2016-02-15 DIAGNOSIS — K219 Gastro-esophageal reflux disease without esophagitis: Secondary | ICD-10-CM

## 2016-02-15 NOTE — Progress Notes (Signed)
Patient ID: Dawn Montoya, female   DOB: Apr 26, 1932, 82 y.o.   MRN: [id number]    DATE: 02/15/16  Location:  [facility] Room Number: [id number] Place of Service: SNF (31)   Extended Emergency Contact Information Primary Emergency Contact: [place] Phone: [phone] Mobile Phone: [phone] Relation: None  Advanced Directive information Does patient have an advance directive?: No, Would patient like information on creating an advanced directive?: No - patient declined information FULL CODE Chief Complaint  Patient presents with  . Medical Management of Chronic Issues    Routine Visit    HPI:  82 yo female long term resident seen today for f/u. She c/o tingling in hand b/l and has poor grip strength resulting in dropping items from hands. She has neck pain. She is scheduled to see specialist next month. Appetite ok. Sleeping well most nights. She is a poor historian due to psych d/o. Hx obtained from chart.   COPD - no exacerbations on pulmicort neb treatment twice daily; advair 100/50 twice daily; spiriva 18 mcg daily; mucinex dm twice daily; albuterol 2 puffs or neb treatment every 6 hours as needed; tessalon 100 mg three times daily as needed   Hypertension - BP controlled on hctz 25 mg daily, lisinopril 10 mg daily   GERD - stable on protonix 40 mg daily   Hypothyroidism - stable on  synthroid 75 mcg daily. TSH [id number]  Diastolic heart failure - stable on lasix 80 mg daily   Cervical myelopathy with cervical radiculopathy - pain uncontrolled on neurontin 100mg  BID and 300 mg nightly; cymbalta 60 mg daily; oxycodone 5 mg every 4 hours as needed   Anxiety - mood stable on cymbalta 60 mg daily and has ativan 0.5 mg three times daily as needed; takes melatonin 3 mg nightly     Past Medical History  Diagnosis Date  . COPD (chronic obstructive pulmonary disease) ([place])   . Breast CA ([place])   . Hypertension   . HTN  (hypertension) 01/18/2014  . Hypothyroidism 01/18/2014  . Medical history non-contributory   . Shortness of breath   . Pneumonia   . Diastolic CHF ([place]) 99991111  . CKD (chronic kidney disease) stage 3, GFR 30-59 ml/min 12/15/2014  . Anxiety state 02/25/2015  . GERD without esophagitis 01/19/2016    Past Surgical History  Procedure Laterality Date  . Cholecystectomy    . Breast surgery    . Mastectomy Left   . Esophagogastroduodenoscopy N/A 01/19/2014    Procedure: ESOPHAGOGASTRODUODENOSCOPY (EGD);  Surgeon: Jeryl Columbia, MD;  Location: [facility];  Service: Endoscopy;  Laterality: N/A;  . Colonoscopy N/A 01/20/2014    Procedure: COLONOSCOPY;  Surgeon: Winfield Cunas., MD;  Location: [facility];  Service: Endoscopy;  Laterality: N/A;    Patient Care Team: Tammy Arn Medal, MD as PCP - General (Family Medicine)  Social History   Social History  . Marital Status: Unknown    Spouse Name: N/A  . Number of Children: N/A  . Years of Education: N/A   Occupational History  . Not on file.   Social History Main Topics  . Smoking status: Former Smoker -- 2.00 packs/day for 50 years    Types: Cigarettes    Quit date: 10/25/1999  . Smokeless tobacco: Never Used  . Alcohol Use: No  . Drug Use: No  . Sexual Activity: No   Other Topics Concern  . Not on file   Social  History Narrative     reports that she quit smoking about 16 years ago. Her smoking use included Cigarettes. She has a 100 pack-year smoking history. She has never used smokeless tobacco. She reports that she does not drink alcohol or use illicit drugs.  Immunization History  Administered Date(s) Administered  . Influenza Split 12/01/2014  . Influenza,inj,Quad PF,36+ Mos 01/21/2014  . Influenza-Unspecified 08/04/2015  . PPD Test 11/14/2014    No Known Allergies  Medications: Patient's Medications  New Prescriptions   No medications on file  Previous Medications   ACETAMINOPHEN (TYLENOL) 325 MG  TABLET    Take 650 mg by mouth every 6 (six) hours as needed. Notify MD if not relieved DO NOT EXCEED 3000 mg in 24 hour period   ALBUTEROL (PROVENTIL HFA;VENTOLIN HFA) 108 (90 BASE) MCG/ACT INHALER    Inhale 2 puffs into the lungs every 6 (six) hours as needed for wheezing or shortness of breath.    ALBUTEROL (PROVENTIL) (2.5 MG/3ML) 0.083% NEBULIZER SOLUTION    Take 3 mLs (2.5 mg total) by nebulization every 6 (six) hours as needed for wheezing or shortness of breath.   BENZONATATE (TESSALON) 100 MG CAPSULE    Take 1 capsule (100 mg total) by mouth 3 (three) times daily as needed for cough.   BISACODYL (DULCOLAX) 10 MG SUPPOSITORY    Place 10 mg rectally as needed for moderate constipation. If not relieved by Bisacodyl give Saline Enema   BUDESONIDE (PULMICORT) 0.25 MG/2ML NEBULIZER SOLUTION    Take 0.25 mg by nebulization 2 (two) times daily.   CHOLECALCIFEROL (VITAMIN D) 1000 UNITS TABLET    Take 1,000 Units by mouth daily.   DEXTROMETHORPHAN-GUAIFENESIN (MUCINEX DM MAXIMUM STRENGTH) 60-1200 MG TB12    Take 1 capsule by mouth 2 (two) times daily. For cough   DULOXETINE (CYMBALTA) 60 MG CAPSULE    Take 1 capsule (60 mg total) by mouth daily.   FLUTICASONE-SALMETEROL (ADVAIR) 100-50 MCG/DOSE AEPB    Inhale one puff into the lungs two times  for COPD   FUROSEMIDE (LASIX) 40 MG TABLET    Take 2 tablets (80 mg total) by mouth daily.   GABAPENTIN (NEURONTIN) 300 MG CAPSULE    Take 300 mg by mouth at bedtime.   HYDROCHLOROTHIAZIDE (HYDRODIURIL) 25 MG TABLET    Take 25 mg by mouth daily.   LEVOTHYROXINE (SYNTHROID, LEVOTHROID) 75 MCG TABLET    Take 75 mcg by mouth daily before breakfast.   LISINOPRIL (PRINIVIL,ZESTRIL) 10 MG TABLET    Take 1 tablet (10 mg total) by mouth daily.   LORAZEPAM (ATIVAN) 0.5 MG TABLET    Take one tablet by mouth every 8 hours as needed for anxiety   MAGNESIUM HYDROXIDE (MILK OF MAGNESIA) 400 MG/5ML SUSPENSION    Take 30 mLs by mouth once. In 24 hours as needed. If not relieved  by MOM give Bisacodyl   MECLIZINE (ANTIVERT) 12.5 MG TABLET    Take 12.5 mg by mouth every 6 (six) hours as needed for dizziness.    MELATONIN 3 MG TABS    Take by mouth at bedtime.   MULTIPLE VITAMINS-MINERALS (MULTIVITAMIN WITH MINERALS) TABLET    Take 1 tablet by mouth daily.   OXYCODONE (OXY IR/ROXICODONE) 5 MG IMMEDIATE RELEASE TABLET    Take one tablet by mouth every 4 hours as needed for pain   PANTOPRAZOLE (PROTONIX) 40 MG TABLET    Take 1 tablet (40 mg total) by mouth daily at 12 noon.   POLYVINYL ALCOHOL (LIQUIFILM  TEARS) 1.4 % OPHTHALMIC SOLUTION    Place 1 drop into both eyes as needed for dry eyes.   SODIUM PHOSPHATES (RA SALINE ENEMA RE)    Place rectally. Rectally x 1 dose/24 hours   TIOTROPIUM (SPIRIVA HANDIHALER) 18 MCG INHALATION CAPSULE    Place 1 capsule (18 mcg total) into inhaler and inhale daily.  Modified Medications   No medications on file  Discontinued Medications   No medications on file    Review of Systems  Unable to perform ROS: Other  anxious   Filed Vitals:   02/15/16 1104  BP: 125/79  Pulse: 68  Temp: 97.4 F (36.3 C)  TempSrc: Oral  Resp: 19  Height: 5\' 3"  (1.6 m)  Weight: 250 lb 9.6 oz (113.671 kg)   Body mass index is 44.4 kg/(m^2).  Physical Exam  Constitutional: She is oriented to person, place, and time. She appears well-developed and well-nourished.  Sitting in w/c in NAD,  O2 intact  HENT:  Mouth/Throat: Oropharynx is clear and moist. No oropharyngeal exudate.  Eyes: Pupils are equal, round, and reactive to light. No scleral icterus.  Neck: Neck supple. No spinous process tenderness and no muscular tenderness present. Carotid bruit is not present. Decreased range of motion present. No tracheal deviation present. No thyromegaly present.    Cardiovascular: Normal rate, regular rhythm and intact distal pulses.  Exam reveals no gallop and no friction rub.   Murmur (1/6 SEM) heard. +1 pitting LE edema b/l. No calf TTP    Pulmonary/Chest: Effort normal. No stridor. No respiratory distress. She has no wheezes. She has no rales.  Reduced BS b/l at base  Abdominal: Soft. Bowel sounds are normal. She exhibits no distension and no mass. There is no hepatomegaly. There is no tenderness. There is no rebound and no guarding.  Lymphadenopathy:    She has no cervical adenopathy.  Neurological: She is alert and oriented to person, place, and time. She has normal reflexes.  Skin: Skin is warm and dry. Rash (chronic venous stasis changes b/l  LE) noted.  Psychiatric: Her behavior is normal. Her mood appears anxious.     Labs reviewed: Nursing Home on 01/19/2016  Component Date Value Ref Range Status  . Triglycerides 12/08/2015 181* 40 - 160 mg/dL Final  . Cholesterol 12/08/2015 160  0 - 200 mg/dL Final  . HDL 12/08/2015 32* 35 - 70 mg/dL Final  . LDL Cholesterol 12/08/2015 92   Final  Abstract on 12/07/2015  Component Date Value Ref Range Status  . TSH 11/10/2015 4.26  .41 - 5.90 uIU/mL Final  . TSH 11/02/2015 3.14  0.41 - 5.90 uIU/mL Final  . TSH 08/31/2015 6.21* 0.41 - 5.90 uIU/mL Final  . Hemoglobin 11/10/2015 12.0  12.0 - 16.0 g/dL Final  . HCT 11/10/2015 38  36 - 46 % Final  . Platelets 11/10/2015 262  150 - 399 K/L Final  . WBC 11/10/2015 8.1   Final  . Glucose 11/10/2015 158   Final  . BUN 11/10/2015 17  4 - 21 mg/dL Final  . Creatinine 11/10/2015 0.8  0.5 - 1.1 mg/dL Final  . Potassium 11/10/2015 4.2  3.4 - 5.3 mmol/L Final  . Sodium 11/10/2015 137  137 - 147 mmol/L Final  . Alkaline Phosphatase 11/10/2015 61  25 - 125 U/L Final  . ALT 11/10/2015 16  7 - 35 U/L Final  . AST 11/10/2015 18  13 - 35 U/L Final  . Bilirubin, Total 11/10/2015 0.4   Final  No results found.   Assessment/Plan   ICD-9-CM ICD-10-CM   1. Neuropathy (HCC) 355.9 G62.9   2. Cervical myelopathy with cervical radiculopathy 721.1 M47.12   3. Chronic obstructive pulmonary disease, unspecified COPD type ([place]) 496 J44.9   4.  Diastolic CHF, chronic (HCC) 428.32 I50.32    428.0    5. Essential hypertension 401.9 I10   6. GERD without esophagitis 530.81 K21.9   7. Hypothyroidism due to acquired atrophy of thyroid 244.8 E03.8    246.8 E03.4   8. Anxiety state 300.00 F41.1    Change gabapentin 300mg  q AM and qhs. Stop 100mg  dose  Cont other meds as ordered  F/u with specialists as scheduled  PT/OT/ST as indicated  Will follow   Genesia Caslin S. Perlie Gold  [facility] [address] [place], [place] [address] [phone] Cell (Monday-Friday 8 AM - 5 PM) [phone] After 5 PM and follow prompts

## 2016-03-10 ENCOUNTER — Other Ambulatory Visit: Payer: Self-pay | Admitting: *Deleted

## 2016-03-10 MED ORDER — LORAZEPAM 0.5 MG PO TABS: ORAL_TABLET | ORAL | Status: DC

## 2016-03-10 NOTE — Telephone Encounter (Signed)
[facility] 

## 2016-03-16 ENCOUNTER — Non-Acute Institutional Stay (SKILLED_NURSING_FACILITY): Payer: Medicare Other | Admitting: Adult Health

## 2016-03-16 ENCOUNTER — Encounter: Payer: Self-pay | Admitting: Adult Health

## 2016-03-16 DIAGNOSIS — J449 Chronic obstructive pulmonary disease, unspecified: Secondary | ICD-10-CM | POA: Diagnosis not present

## 2016-03-16 DIAGNOSIS — M5412 Radiculopathy, cervical region: Secondary | ICD-10-CM

## 2016-03-16 DIAGNOSIS — Z972 Presence of dental prosthetic device (complete) (partial): Secondary | ICD-10-CM | POA: Diagnosis not present

## 2016-03-16 DIAGNOSIS — K0889 Other specified disorders of teeth and supporting structures: Secondary | ICD-10-CM

## 2016-03-16 DIAGNOSIS — M4712 Other spondylosis with myelopathy, cervical region: Secondary | ICD-10-CM

## 2016-03-16 DIAGNOSIS — I5032 Chronic diastolic (congestive) heart failure: Secondary | ICD-10-CM | POA: Diagnosis not present

## 2016-03-16 DIAGNOSIS — E038 Other specified hypothyroidism: Secondary | ICD-10-CM | POA: Diagnosis not present

## 2016-03-16 DIAGNOSIS — E034 Atrophy of thyroid (acquired): Secondary | ICD-10-CM

## 2016-03-16 DIAGNOSIS — R300 Dysuria: Secondary | ICD-10-CM | POA: Diagnosis not present

## 2016-03-16 NOTE — Progress Notes (Signed)
Location:  [facility] Room Number: [place] of Service:  SNF [phone]) Provider:   Cindi Carbon, [place] [phone]   Gildardo Cranker, DO  Patient Care Team: Gildardo Cranker, DO as PCP - General (Internal Medicine)  Extended Emergency Contact Information Primary Emergency Contact: Yvonne Kendall [place] of [place] Phone: [phone] Mobile Phone: [phone] Relation: None  Code Status:  Full Code Goals of care: Advanced Directive information Advanced Directives 03/16/2016  Does patient have an advance directive? No  Type of Advance Directive -  Copy of advanced directive(s) in chart? -  Would patient like information on creating an advanced directive? -     Chief Complaint  Patient presents with  . Medical Management of Chronic Issues    Routine Visit    HPI:  Pt is a 82 y.o. female seen today for medical management of chronic diseases.  Resides in skilled care to COPD with limited mobility, as well as mental illness.   1. Cervical myelopathy with cervical radiculopathy Resident had this issue in 2016, saw neurology and the following MRI was obtained. Symptoms are still present despite increase in Neurotin. She reports neck pain and hand numbness/tingling. She reports that she is still able to go to activities at the facility. Currently using a ball for hand exercises MRI of cervical spine 01/07/15 Abnormal MRI cervical spine (without) demonstrating: 1. At C6-7: disc bulging and facet hypertrophy with moderate-severe spinal stenosis, moderate right and severe left foraminal stenosis  2. At C5-6: disc bulging and facet hypertrophy with moderate-severe spinal stenosis and moderate biforaminal stenosis  3. At C4-5: uncovertebral joint hypertrophy with mild biforaminal stenosis  4. At C3-4: uncovertebral joint hypertrophy with mild left foraminal stenosis  5. Patient motion artifact limits this study. Consider  repeat scan with sedation if indicated.  2. Chronic diastolic congestive heart failure (HCC) Weight trending down On lasix and hctz Has sob with exertion which has not changed  3. Chronic obstructive pulmonary disease, unspecified COPD type ([place]) Stable with DOE and decreased sats when walking per resident in the hall way Spends most of her time in the [facility] Currently on spiriva and pulmicort  4. Hypothyroidism due to acquired atrophy of thyroid - Lab Results  Component Value Date   TSH 4.26 11/10/2015  on synthroid without symptoms   5. Dysuria Reported today without fever  6. Dentures complicating chewing States she is having issues chewing her food    Past Medical History  Diagnosis Date  . COPD (chronic obstructive pulmonary disease) ([place])   . Breast CA ([place])   . Hypertension   . HTN (hypertension) 01/18/2014  . Hypothyroidism 01/18/2014  . Medical history non-contributory   . Shortness of breath   . Pneumonia   . Diastolic CHF ([place]) 99991111  . CKD (chronic kidney disease) stage 3, GFR 30-59 ml/min 12/15/2014  . Anxiety state 02/25/2015  . GERD without esophagitis 01/19/2016   Past Surgical History  Procedure Laterality Date  . Cholecystectomy    . Breast surgery    . Mastectomy Left   . Esophagogastroduodenoscopy N/A 01/19/2014    Procedure: ESOPHAGOGASTRODUODENOSCOPY (EGD);  Surgeon: Jeryl Columbia, MD;  Location: [facility];  Service: Endoscopy;  Laterality: N/A;  . Colonoscopy N/A 01/20/2014    Procedure: COLONOSCOPY;  Surgeon: Winfield Cunas., MD;  Location: [facility];  Service: Endoscopy;  Laterality: N/A;    No Known Allergies    Medication List  This list is accurate as of: 03/16/16 10:25 AM.  Always use your most recent med list.               acetaminophen 325 MG tablet  Commonly known as:  TYLENOL  Take 650 mg by mouth every 6 (six) hours as needed. Notify MD if not relieved DO NOT EXCEED 3000 mg in 24 hour period     albuterol 108  (90 Base) MCG/ACT inhaler  Commonly known as:  PROVENTIL HFA;VENTOLIN HFA  Inhale 2 puffs into the lungs every 6 (six) hours as needed for wheezing or shortness of breath.     albuterol (2.5 MG/3ML) 0.083% nebulizer solution  Commonly known as:  PROVENTIL  Take 3 mLs (2.5 mg total) by nebulization every 6 (six) hours as needed for wheezing or shortness of breath.     benzonatate 100 MG capsule  Commonly known as:  TESSALON  Take 1 capsule (100 mg total) by mouth 3 (three) times daily as needed for cough.     bisacodyl 10 MG suppository  Commonly known as:  DULCOLAX  Place 10 mg rectally as needed for moderate constipation. If not relieved by Bisacodyl give Saline Enema     budesonide 0.25 MG/2ML nebulizer solution  Commonly known as:  PULMICORT  Take 0.25 mg by nebulization 2 (two) times daily.     cholecalciferol 1000 units tablet  Commonly known as:  VITAMIN D  Take 1,000 Units by mouth daily.     DULoxetine 60 MG capsule  Commonly known as:  CYMBALTA  Take 1 capsule (60 mg total) by mouth daily.     Fluticasone-Salmeterol 100-50 MCG/DOSE Aepb  Commonly known as:  ADVAIR  Inhale one puff into the lungs two times  for COPD     furosemide 40 MG tablet  Commonly known as:  LASIX  Take 2 tablets (80 mg total) by mouth daily.     gabapentin 300 MG capsule  Commonly known as:  NEURONTIN  Take 300 mg by mouth at bedtime.     hydrochlorothiazide 25 MG tablet  Commonly known as:  HYDRODIURIL  Take 25 mg by mouth daily.     levothyroxine 75 MCG tablet  Commonly known as:  SYNTHROID, LEVOTHROID  Take 75 mcg by mouth daily before breakfast.     lisinopril 10 MG tablet  Commonly known as:  PRINIVIL,ZESTRIL  Take 1 tablet (10 mg total) by mouth daily.     LORazepam 0.5 MG tablet  Commonly known as:  ATIVAN  Take one tablet by mouth every 8 hours as needed for anxiety     magnesium hydroxide 400 MG/5ML suspension  Commonly known as:  MILK OF MAGNESIA  Take 30 mLs by mouth  once. In 24 hours as needed. If not relieved by MOM give Bisacodyl     meclizine 12.5 MG tablet  Commonly known as:  ANTIVERT  Take 12.5 mg by mouth every 6 (six) hours as needed for dizziness.     Melatonin 3 MG Tabs  Take by mouth at bedtime.     MUCINEX DM MAXIMUM STRENGTH 60-1200 MG Tb12  Take 1 capsule by mouth 2 (two) times daily. For cough     multivitamin with minerals tablet  Take 1 tablet by mouth daily.     oxyCODONE 5 MG immediate release tablet  Commonly known as:  Oxy IR/ROXICODONE  Take one tablet by mouth every 4 hours as needed for pain     pantoprazole 40 MG tablet  Commonly known as:  PROTONIX  Take 1 tablet (40 mg total) by mouth daily at 12 noon.     polyvinyl alcohol 1.4 % ophthalmic solution  Commonly known as:  LIQUIFILM TEARS  Place 1 drop into both eyes as needed for dry eyes.     RA SALINE ENEMA RE  Place rectally. Rectally x 1 dose/24 hours     tiotropium 18 MCG inhalation capsule  Commonly known as:  SPIRIVA HANDIHALER  Place 1 capsule (18 mcg total) into inhaler and inhale daily.        Review of Systems  Constitutional: Negative for fever, chills, diaphoresis, activity change, appetite change and fatigue.  HENT: Negative for congestion.   Respiratory: Positive for shortness of breath (unchanged on exertion). Negative for cough and wheezing.   Cardiovascular: Negative for chest pain, palpitations and leg swelling.  Gastrointestinal: Negative for diarrhea, constipation and abdominal distention.  Genitourinary: Positive for dysuria. Negative for frequency, hematuria, flank pain, enuresis and difficulty urinating.  Musculoskeletal: Positive for back pain, arthralgias and gait problem.  Neurological: Positive for weakness and numbness. Negative for dizziness and facial asymmetry.  Psychiatric/Behavioral: Negative for behavioral problems, confusion and agitation.    Immunization History  Administered Date(s) Administered  . Influenza Split  12/01/2014  . Influenza,inj,Quad PF,36+ Mos 01/21/2014  . Influenza-Unspecified 08/04/2015  . PPD Test 11/14/2014   Pertinent  Health Maintenance Due  Topic Date Due  . PNA vac Low Risk Adult (1 of 2 - PCV13) 12/12/2016 (Originally 12/10/1998)  . DEXA SCAN  12/11/2023 (Originally 12/10/1998)  . INFLUENZA VACCINE  05/24/2016   No flowsheet data found. Functional Status Survey:    Filed Vitals:   03/16/16 1014  BP: 125/79  Pulse: 68  Temp: 97.4 F (36.3 C)  TempSrc: Oral  Resp: 19  Height: 5\' 3"  (1.6 m)  Weight: 249 lb 6.4 oz (113.127 kg)   Body mass index is 44.19 kg/(m^2).  Wt Readings from Last 3 Encounters:  03/16/16 249 lb 6.4 oz (113.127 kg)  02/15/16 250 lb 9.6 oz (113.671 kg)  01/19/16 255 lb 6 oz (115.837 kg)    Physical Exam  Constitutional: She is oriented to person, place, and time. No distress.  HENT:  Nose: Nose normal.  Mouth/Throat: Oropharynx is clear and moist. No oropharyngeal exudate.  Gums are WNL, ill fitting dentures  Neck: No JVD present.  Pulmonary/Chest: Effort normal and breath sounds normal.  Decreased throughout  Abdominal: Soft. Bowel sounds are normal.  Musculoskeletal: She exhibits no edema or tenderness.  Neurological: She is alert and oriented to person, place, and time. She displays normal reflexes. No cranial nerve deficit.  Strength 4/5 to BUE  Skin: Skin is warm and dry. She is not diaphoretic.  Psychiatric: She has a normal mood and affect.    Labs reviewed:  Recent Labs  11/10/15  NA 137  K 4.2  BUN 17  CREATININE 0.8    Recent Labs  11/10/15  AST 18  ALT 16  ALKPHOS 61    Recent Labs  04/11/15 11/10/15  WBC 11.8 8.1  HGB 12.6 12.0  HCT 37 38  PLT 248 262   Lab Results  Component Value Date   TSH 4.26 11/10/2015   Lab Results  Component Value Date   HGBA1C 6.5* 10/26/2014   Lab Results  Component Value Date   CHOL 160 12/08/2015   HDL 32* 12/08/2015   LDLCALC 92 12/08/2015   TRIG 181* 12/08/2015     Significant Diagnostic Results  in last 30 days:  No results found.  Assessment/Plan  1. Cervical myelopathy with cervical radiculopathy -due to cervical stenosis, see above -continues with pain and numbness despite increasing neurontin, but she is still able to participate in activities at [facility] -will increase neurontin to 300 mg TID -defer to Dr. Eulas Post for further referral, as the resident is not a good surgical candidate  2. Chronic diastolic congestive heart failure (HCC) -weights trending down -unchanged symptoms -continue lasix and hctz  3. Chronic obstructive pulmonary disease, unspecified COPD type ([place]) -followed by pulmonary -remains on 02 2L, has C02 retention -continue pulmicort, spiriva, and albuterol  4. Hypothyroidism due to acquired atrophy of thyroid Continue synthroid  5. Dysuria -reported today without fever, sensation comes and goes -check UA C and S  6. Dentures complicating chewing Wrote order for referral for new fitting if possible   Family/ staff Communication: discussed with staff and resident  Labs/tests ordered:  Next visit

## 2016-04-11 ENCOUNTER — Ambulatory Visit (INDEPENDENT_AMBULATORY_CARE_PROVIDER_SITE_OTHER): Payer: Medicare Other | Admitting: Diagnostic Neuroimaging

## 2016-04-11 ENCOUNTER — Encounter: Payer: Self-pay | Admitting: Diagnostic Neuroimaging

## 2016-04-11 VITALS — BP 106/59 | HR 80 | Wt 248.2 lb

## 2016-04-11 DIAGNOSIS — R208 Other disturbances of skin sensation: Secondary | ICD-10-CM | POA: Diagnosis not present

## 2016-04-11 DIAGNOSIS — J449 Chronic obstructive pulmonary disease, unspecified: Secondary | ICD-10-CM | POA: Diagnosis not present

## 2016-04-11 DIAGNOSIS — R269 Unspecified abnormalities of gait and mobility: Secondary | ICD-10-CM | POA: Diagnosis not present

## 2016-04-11 DIAGNOSIS — D329 Benign neoplasm of meninges, unspecified: Secondary | ICD-10-CM

## 2016-04-11 DIAGNOSIS — R200 Anesthesia of skin: Secondary | ICD-10-CM

## 2016-04-11 DIAGNOSIS — I5032 Chronic diastolic (congestive) heart failure: Secondary | ICD-10-CM

## 2016-04-11 DIAGNOSIS — R292 Abnormal reflex: Secondary | ICD-10-CM | POA: Diagnosis not present

## 2016-04-11 DIAGNOSIS — M4712 Other spondylosis with myelopathy, cervical region: Secondary | ICD-10-CM

## 2016-04-11 HISTORY — DX: Other spondylosis with myelopathy, cervical region: M47.12

## 2016-04-11 NOTE — Progress Notes (Addendum)
[facility]  PATIENT: Dawn Montoya DOB: 1932/07/16  REFERRING CLINICIAN/PCP: Gildardo Cranker, DO HISTORY FROM: patient  REASON FOR VISIT: follow up   HISTORICAL  CHIEF COMPLAINT:  Chief Complaint  Patient presents with  . Postural tremor    rm 7, lives at [facility], [place], [place], "my hands hurt, pins and needles and sore, they shake, I drop things; they are getting worse"  . Follow-up    last seen 11/2014    HISTORY OF PRESENT ILLNESS:   UPDATE 04/11/16: Since last visit, lost to follow up; patient was supposed to return in May 2016. Had no show on 03/03/15, cancelled appt on 03/18/15, and another no show on 01/25/16. MRI brain from March 2016 --> midline calcified meningoma with mild brain edema. MRI cervical shows myelopathy and radiculopathy. Patient and PCP do not feel that she is a good candidate for surgery.  PRIOR HPI (12/16/14): 82 year old right-handed female here for evaluation of tremor and numbness in hands and feet. Patient reports pins and needles in hands and fingers, right greater than left, with swollen joints for several months. She also has had tremor for the past one month. She has difficulty with holding cup, dropping things, shaking. She has some shaking when she is doing certain actions. No family history of tremor. She is having some numbness and tingling in the toes and feet. She is having some dyspnea on exertion, especially if she walks 1 block.  Past medical history of COPD on home oxygen, diastolic congestive heart failure, hypothyroidism, hypertension. Patient was admitted to hospital in January 2016 for shortness of breath.   REVIEW OF SYSTEMS: Full 14 system review of systems performed and negative except as per HPI.  ALLERGIES: No Known Allergies  HOME MEDICATIONS: Outpatient Prescriptions Prior to Visit  Medication Sig Dispense Refill  . acetaminophen (TYLENOL) 325 MG tablet Take 650 mg by mouth every 6 (six) hours as needed.  Reported on 04/11/2016    . albuterol (PROVENTIL HFA;VENTOLIN HFA) 108 (90 BASE) MCG/ACT inhaler Inhale 2 puffs into the lungs every 6 (six) hours as needed for wheezing or shortness of breath. Reported on 04/11/2016    . albuterol (PROVENTIL) (2.5 MG/3ML) 0.083% nebulizer solution Take 3 mLs (2.5 mg total) by nebulization every 6 (six) hours as needed for wheezing or shortness of breath.    . benzonatate (TESSALON) 100 MG capsule Take 1 capsule (100 mg total) by mouth 3 (three) times daily as needed for cough. 20 capsule 0  . bisacodyl (DULCOLAX) 10 MG suppository Place 10 mg rectally as needed for moderate constipation. If not relieved by Bisacodyl give Saline Enema    . budesonide (PULMICORT) 0.25 MG/2ML nebulizer solution Take 0.25 mg by nebulization 2 (two) times daily.    . cholecalciferol (VITAMIN D) 1000 UNITS tablet Take 1,000 Units by mouth daily. Reported on 04/11/2016    . Dextromethorphan-Guaifenesin (MUCINEX DM MAXIMUM STRENGTH) 60-1200 MG TB12 Take 1 capsule by mouth 2 (two) times daily. Reported on 04/11/2016    . DULoxetine (CYMBALTA) 60 MG capsule Take 1 capsule (60 mg total) by mouth daily.    . Fluticasone-Salmeterol (ADVAIR) 100-50 MCG/DOSE AEPB Inhale one puff into the lungs two times  for COPD    . furosemide (LASIX) 40 MG tablet Take 2 tablets (80 mg total) by mouth daily. 30 tablet 0  . gabapentin (NEURONTIN) 300 MG capsule Take 300 mg by mouth at bedtime.    . hydrochlorothiazide (HYDRODIURIL) 25 MG tablet Take 25 mg by mouth  daily.    . levothyroxine (SYNTHROID, LEVOTHROID) 75 MCG tablet Take 75 mcg by mouth daily before breakfast.    . lisinopril (PRINIVIL,ZESTRIL) 10 MG tablet Take 1 tablet (10 mg total) by mouth daily. 30 tablet 0  . LORazepam (ATIVAN) 0.5 MG tablet Take one tablet by mouth every 8 hours as needed for anxiety 90 tablet 1  . magnesium hydroxide (MILK OF MAGNESIA) 400 MG/5ML suspension Take 30 mLs by mouth once. Reported on 04/11/2016    . meclizine (ANTIVERT)  12.5 MG tablet Take 12.5 mg by mouth every 6 (six) hours as needed for dizziness. Reported on 04/11/2016    . Melatonin 3 MG TABS Take by mouth at bedtime.    . Multiple Vitamins-Minerals (MULTIVITAMIN WITH MINERALS) tablet Take 1 tablet by mouth daily.    Marland Kitchen oxyCODONE (OXY IR/ROXICODONE) 5 MG immediate release tablet Take one tablet by mouth every 4 hours as needed for pain 120 tablet 0  . pantoprazole (PROTONIX) 40 MG tablet Take 1 tablet (40 mg total) by mouth daily at 12 noon. 30 tablet 0  . polyvinyl alcohol (LIQUIFILM TEARS) 1.4 % ophthalmic solution Place 1 drop into both eyes as needed for dry eyes. Reported on 04/11/2016    . Sodium Phosphates (RA SALINE ENEMA RE) Place rectally. Reported on 04/11/2016    . tiotropium (SPIRIVA HANDIHALER) 18 MCG inhalation capsule Place 1 capsule (18 mcg total) into inhaler and inhale daily. 30 capsule 12   No facility-administered medications prior to visit.    PAST MEDICAL HISTORY: Past Medical History  Diagnosis Date  . COPD (chronic obstructive pulmonary disease) ([place])   . Breast CA ([place])   . Hypertension   . HTN (hypertension) 01/18/2014  . Hypothyroidism 01/18/2014  . Medical history non-contributory   . Shortness of breath   . Pneumonia   . Diastolic CHF ([place]) 99991111  . CKD (chronic kidney disease) stage 3, GFR 30-59 ml/min 12/15/2014  . Anxiety state 02/25/2015  . GERD without esophagitis 01/19/2016  . Spondylosis, cervical, with myelopathy 04/11/2016    PAST SURGICAL HISTORY: Past Surgical History  Procedure Laterality Date  . Cholecystectomy    . Breast surgery    . Mastectomy Left   . Esophagogastroduodenoscopy N/A 01/19/2014    Procedure: ESOPHAGOGASTRODUODENOSCOPY (EGD);  Surgeon: Jeryl Columbia, MD;  Location: [facility];  Service: Endoscopy;  Laterality: N/A;  . Colonoscopy N/A 01/20/2014    Procedure: COLONOSCOPY;  Surgeon: Winfield Cunas., MD;  Location[facility];  Service: Endoscopy;  Laterality: N/A;    FAMILY  HISTORY: Family History  Problem Relation Age of Onset  . Lung cancer Sister   . Breast cancer Sister   . Breast cancer Daughter   . Breast cancer Sister     SOCIAL HISTORY:  Social History   Social History  . Marital Status: Unknown    Spouse Name: N/A  . Number of Children: N/A  . Years of Education: N/A   Occupational History  . Not on file.   Social History Main Topics  . Smoking status: Former Smoker -- 2.00 packs/day for 50 years    Types: Cigarettes    Quit date: 10/25/1999  . Smokeless tobacco: Never Used  . Alcohol Use: No  . Drug Use: No  . Sexual Activity: No   Other Topics Concern  . Not on file   Social History Narrative     PHYSICAL EXAM  Filed Vitals:   04/11/16 1042  BP: 106/59  Pulse: 80  Weight:  248 lb 3.2 oz (112.583 kg)    Body mass index is 43.98 kg/(m^2).  No exam data present  No flowsheet data found.  GENERAL EXAM: Patient is in no distress; well developed, nourished and groomed; neck is supple; SLIGHTLY NERVOUS APPEARING; JITTERY; USING OXYGEN VIA [place]; SLIGHTLY INCR WORK OF BREATHING  CARDIOVASCULAR: Regular rate and rhythm, no murmurs, no carotid bruits  NEUROLOGIC: MENTAL STATUS: awake, alert, language fluent, comprehension intact, naming intact, fund of knowledge appropriate CRANIAL NERVE: pupils equal and reactive to light, visual fields full to confrontation, extraocular muscles intact, no nystagmus, facial sensation and strength symmetric, hearing DECR IN LEFT EAR, palate elevates symmetrically, uvula midline, shoulder shrug symmetric, tongue midline. MOTOR: NO POSTURAL TREMOR. NO REST TREMOR; MILD BRADYKINESIA IN RUE; BUE STRENGTH 4+; BLE INCR TONE (HF 3, KE/KF 4, DF 5).  SENSORY: ABSENT VIB AT TOES, ANKLES; ABSENT PP IN FEET; DECR PP IN FINGERS (EXCEPT DIGIT 3 IS PRESERVED BILATERALLY) COORDINATION: finger-nose-finger --> MILD DYSMETRIA IN BUE REFLEXES: BUE 2; KNEES 3 WITH SPREAD; ANKLES 1 GAIT/STATION: IN WHEELCHAIR;  CANNOT STAND UNASSISTED    DIAGNOSTIC DATA (LABS, IMAGING, TESTING) - I reviewed patient records, labs, notes, testing and imaging myself where available.  Lab Results  Component Value Date   WBC 8.1 11/10/2015   HGB 12.0 11/10/2015   HCT 38 11/10/2015   MCV 93.3 10/30/2014   PLT 262 11/10/2015      Component Value Date/Time   NA 137 11/10/2015   NA 141 10/30/2014 0432   K 4.2 11/10/2015   CL 96 10/30/2014 0432   CO2 38* 10/30/2014 0432   GLUCOSE 115* 10/30/2014 0432   BUN 17 11/10/2015   BUN 31* 10/30/2014 0432   CREATININE 0.8 11/10/2015   CREATININE 1.15* 10/30/2014 0432   CALCIUM 8.9 10/30/2014 0432   PROT 7.0 10/25/2014 0235   ALBUMIN 3.5 10/25/2014 0235   AST 18 11/10/2015   ALT 16 11/10/2015   ALKPHOS 61 11/10/2015   BILITOT 0.6 10/25/2014 0235   GFRNONAA 44* 10/30/2014 0432   GFRAA 51* 10/30/2014 0432   Lab Results  Component Value Date   CHOL 160 12/08/2015   HDL 32* 12/08/2015   LDLCALC 92 12/08/2015   TRIG 181* 12/08/2015   Lab Results  Component Value Date   HGBA1C 6.5* 10/26/2014   Lab Results  Component Value Date   VITAMINB12 411 12/16/2014   Lab Results  Component Value Date   TSH 4.26 11/10/2015   01/07/15 MRI brain (without) [I reviewed images myself and agree with interpretation. -VRP]  1. Midline, anterior falcine, extra-axial mass, likely a heavily calcified meningioma. It measures 3.7x2.3x1.5cm (AP x trans x SI). There is mass effect on the bilateral gyrus rectus with adjacent vasogenic edema. 2. Mild scattered periventricular and subcortical foci of chronic small vessel ischemic disease.  3. No acute findings.   01/07/16 MRI cervical spine (without) [I reviewed images myself and agree with interpretation. -VRP]  1. At C6-7: disc bulging and facet hypertrophy with moderate-severe spinal stenosis, moderate right and severe left foraminal stenosis  2. At C5-6: disc bulging and facet hypertrophy with moderate-severe spinal stenosis and  moderate biforaminal stenosis  3. At C4-5: uncovertebral joint hypertrophy with mild biforaminal stenosis  4. At C3-4: uncovertebral joint hypertrophy with mild left foraminal stenosis  5. Patient motion artifact limits this study. Consider repeat scan with sedation if indicated.    ASSESSMENT AND PLAN  82 y.o. year old female here with progressive tremor, pain, numbness in bilateral hands  since past 3 months. Also with progressive gait decline over past 6-12 months. Exam notable for postural/action tremor, decr sens in BLE, hypersens/painful fingertips, hyperreflexia in BUE and BLE. Found to have midline meningioma and moderate-severe spinal stenosis. However, patient not a good surgical candidate and not interested in surgical intervention at this time.    Dx: cervical myelopathy + meningioma  Bilateral hand numbness  Spondylosis, cervical, with myelopathy  Meningioma (HCC)  Gait difficulty  Hyperreflexia  Chronic diastolic congestive heart failure (HCC)  Chronic obstructive pulmonary disease, unspecified COPD type (HCC)     PLAN: I spent 25 minutes of face to face time with patient. Greater than 50% of time was spent in counseling and coordination of care with patient. In summary we discussed:  - offered and discussed referral to neurosurgery, but patient may not be a good surgical candidate (due to age, functional status, COPD, CHF); she will discuss with her daughter and let me know - continue gabapentin, duloxetine, oxycodone per PCP - I recommend conservative mgmt with pain medications and palliative approach  Return if symptoms worsen or fail to improve, for return to PCP.    Penni Bombard, MD [id number], 0000000 AM Certified in Neurology, Neurophysiology and Neuroimaging  [facility] [address] [place], [place] [address] [phone]

## 2016-04-11 NOTE — Patient Instructions (Signed)
-   consider referral to neurosurgery, however this may be challenging due to age, functional status, COPD, CHF; discuss with your doctors and family - continue gabapentin, duloxetine, oxycodone - I recommend conservative management with pain medications and palliative approach

## 2016-04-18 ENCOUNTER — Encounter: Payer: Self-pay | Admitting: Internal Medicine

## 2016-04-18 ENCOUNTER — Non-Acute Institutional Stay (SKILLED_NURSING_FACILITY): Payer: Medicare Other | Admitting: Internal Medicine

## 2016-04-18 DIAGNOSIS — K219 Gastro-esophageal reflux disease without esophagitis: Secondary | ICD-10-CM | POA: Diagnosis not present

## 2016-04-18 DIAGNOSIS — J9611 Chronic respiratory failure with hypoxia: Secondary | ICD-10-CM

## 2016-04-18 DIAGNOSIS — J432 Centrilobular emphysema: Secondary | ICD-10-CM | POA: Diagnosis not present

## 2016-04-18 DIAGNOSIS — E034 Atrophy of thyroid (acquired): Secondary | ICD-10-CM | POA: Diagnosis not present

## 2016-04-18 DIAGNOSIS — K432 Incisional hernia without obstruction or gangrene: Secondary | ICD-10-CM | POA: Diagnosis not present

## 2016-04-18 DIAGNOSIS — I5032 Chronic diastolic (congestive) heart failure: Secondary | ICD-10-CM | POA: Diagnosis not present

## 2016-04-18 DIAGNOSIS — E038 Other specified hypothyroidism: Secondary | ICD-10-CM

## 2016-04-18 DIAGNOSIS — I10 Essential (primary) hypertension: Secondary | ICD-10-CM | POA: Diagnosis not present

## 2016-04-18 DIAGNOSIS — F411 Generalized anxiety disorder: Secondary | ICD-10-CM | POA: Diagnosis not present

## 2016-04-18 DIAGNOSIS — M4712 Other spondylosis with myelopathy, cervical region: Secondary | ICD-10-CM

## 2016-04-18 DIAGNOSIS — M5412 Radiculopathy, cervical region: Secondary | ICD-10-CM

## 2016-04-18 NOTE — Progress Notes (Signed)
DATE: 04/18/16  Location:    [facility] Room Number: [id number] Place of Service: SNF (31)   Extended Emergency Contact Information Primary Emergency Contact: [place] Phone: [phone] Mobile Phone: [phone] Relation: None  Advanced Directive information Does patient have an advance directive?: No FULL CODE Chief Complaint  Patient presents with  . Medical Management of Chronic Issues    Routine Visit    HPI:  82 yo female long term resident seen today for f/u. She saw neurology last week and was told she needed sx for meningioma and possible cervical spinal stenosis. She states she s/w her daughter and has decided against procedures. She has numbness/tingling in her fingers and tremors. No falls. No change in bowel/bladder habits. Appetite is excellent. No nursigng issues.  COPD/chronic respiratory failure - no exacerbations on pulmicort neb treatment twice daily; advair 100/50 twice daily; spiriva 18 mcg daily; mucinex dm twice daily; albuterol 2 puffs or neb treatment every 6 hours as needed; tessalon 100 mg three times daily as needed. She is on ATC [place] O2  Hypertension - BP controlled on hctz 25 mg daily, lisinopril 10 mg daily   GERD - stable on protonix 40 mg daily. She has noticed her belly bulges out on right when she coughs  Hypothyroidism - stable on  synthroid 75 mcg daily. TSH [id number]  Diastolic heart failure - stable on lasix 80 mg daily   Cervical myelopathy with cervical radiculopathy - pain uncontrolled on neurontin 100mg  BID and 300 mg nightly; cymbalta 60 mg daily; oxycodone 5 mg every 4 hours as needed. Followed by neurology  Anxiety - mood stable on cymbalta 60 mg daily and has ativan 0.5 mg three times daily as needed; takes melatonin 3 mg nightly   Meningioma - midline. followed by neurology  Past Medical History  Diagnosis Date  . COPD (chronic obstructive pulmonary disease) ([place])   . Breast CA ([place])     . Hypertension   . HTN (hypertension) 01/18/2014  . Hypothyroidism 01/18/2014  . Medical history non-contributory   . Shortness of breath   . Pneumonia   . Diastolic CHF ([place]) 99991111  . CKD (chronic kidney disease) stage 3, GFR 30-59 ml/min 12/15/2014  . Anxiety state 02/25/2015  . GERD without esophagitis 01/19/2016  . Spondylosis, cervical, with myelopathy 04/11/2016    Past Surgical History  Procedure Laterality Date  . Cholecystectomy    . Breast surgery    . Mastectomy Left   . Esophagogastroduodenoscopy N/A 01/19/2014    Procedure: ESOPHAGOGASTRODUODENOSCOPY (EGD);  Surgeon: Jeryl Columbia, MD;  Location: [facility];  Service: Endoscopy;  Laterality: N/A;  . Colonoscopy N/A 01/20/2014    Procedure: COLONOSCOPY;  Surgeon: Winfield Cunas., MD;  Location: [facility];  Service: Endoscopy;  Laterality: N/A;    Patient Care Team: Gildardo Cranker, DO as PCP - General (Internal Medicine)  Social History   Social History  . Marital Status: Unknown    Spouse Name: N/A  . Number of Children: N/A  . Years of Education: N/A   Occupational History  . Not on file.   Social History Main Topics  . Smoking status: Former Smoker -- 2.00 packs/day for 50 years    Types: Cigarettes    Quit date: 10/25/1999  . Smokeless tobacco: Never Used  . Alcohol Use: No  . Drug Use: No  . Sexual Activity: No   Other Topics Concern  . Not on file  Social History Narrative     reports that she quit smoking about 16 years ago. Her smoking use included Cigarettes. She has a 100 pack-year smoking history. She has never used smokeless tobacco. She reports that she does not drink alcohol or use illicit drugs.  Family History  Problem Relation Age of Onset  . Lung cancer Sister   . Breast cancer Sister   . Breast cancer Daughter   . Breast cancer Sister    Family Status  Relation Status Death Age  . Sister Deceased   . Mother Deceased   . Father Deceased   . Brother Alive   . Sister  Alive   . Brother Deceased     Immunization History  Administered Date(s) Administered  . Influenza Split 12/01/2014  . Influenza,inj,Quad PF,36+ Mos 01/21/2014  . Influenza-Unspecified 08/04/2015  . PPD Test 11/14/2014    No Known Allergies  Medications: Patient's Medications  New Prescriptions   No medications on file  Previous Medications   ACETAMINOPHEN (TYLENOL) 325 MG TABLET    Take 650 mg by mouth every 6 (six) hours as needed. Reported on 04/11/2016   ALBUTEROL (PROVENTIL HFA;VENTOLIN HFA) 108 (90 BASE) MCG/ACT INHALER    Inhale 2 puffs into the lungs every 6 (six) hours as needed for wheezing or shortness of breath. Reported on 04/11/2016   ALBUTEROL (PROVENTIL) (2.5 MG/3ML) 0.083% NEBULIZER SOLUTION    Take 3 mLs (2.5 mg total) by nebulization every 6 (six) hours as needed for wheezing or shortness of breath.   BENZONATATE (TESSALON) 100 MG CAPSULE    Take 1 capsule (100 mg total) by mouth 3 (three) times daily as needed for cough.   BISACODYL (DULCOLAX) 10 MG SUPPOSITORY    Place 10 mg rectally as needed for moderate constipation. If not relieved by Bisacodyl give Saline Enema   BUDESONIDE (PULMICORT) 0.25 MG/2ML NEBULIZER SOLUTION    Take 0.25 mg by nebulization 2 (two) times daily.   CHOLECALCIFEROL (VITAMIN D) 1000 UNITS TABLET    Take 1,000 Units by mouth daily. Reported on 04/11/2016   DEXTROMETHORPHAN-GUAIFENESIN (MUCINEX DM MAXIMUM STRENGTH) 60-1200 MG TB12    Take 1 capsule by mouth 2 (two) times daily. Reported on 04/11/2016   DULOXETINE (CYMBALTA) 60 MG CAPSULE    Take 1 capsule (60 mg total) by mouth daily.   FLUTICASONE-SALMETEROL (ADVAIR) 500-50 MCG/DOSE AEPB    Inhale 1 puff into the lungs 2 (two) times daily.   FUROSEMIDE (LASIX) 40 MG TABLET    Take 2 tablets (80 mg total) by mouth daily.   GABAPENTIN (NEURONTIN) 300 MG CAPSULE    Take 300 mg by mouth 3 (three) times daily.    HYDROCHLOROTHIAZIDE (HYDRODIURIL) 25 MG TABLET    Take 25 mg by mouth daily.    LEVOTHYROXINE (SYNTHROID, LEVOTHROID) 75 MCG TABLET    Take 75 mcg by mouth daily before breakfast.   LISINOPRIL (PRINIVIL,ZESTRIL) 10 MG TABLET    Take 1 tablet (10 mg total) by mouth daily.   LORAZEPAM (ATIVAN) 0.5 MG TABLET    Take one tablet by mouth every 8 hours as needed for anxiety   MAGNESIUM HYDROXIDE (MILK OF MAGNESIA) 400 MG/5ML SUSPENSION    Take 30 mLs by mouth once. Reported on 04/11/2016   MELATONIN 3 MG TABS    Take by mouth at bedtime.   MULTIPLE VITAMINS-MINERALS (MULTIVITAMIN WITH MINERALS) TABLET    Take 1 tablet by mouth daily.   OXYCODONE (OXY IR/ROXICODONE) 5 MG IMMEDIATE RELEASE TABLET  Take one tablet by mouth every 4 hours as needed for pain   OXYGEN    Inhale into the lungs. Nasal O2 2L/min for hypoxia   PANTOPRAZOLE (PROTONIX) 40 MG TABLET    Take 1 tablet (40 mg total) by mouth daily at 12 noon.   SODIUM PHOSPHATES (RA SALINE ENEMA RE)    Place rectally. Reported on 04/11/2016   TIOTROPIUM (SPIRIVA HANDIHALER) 18 MCG INHALATION CAPSULE    Place 1 capsule (18 mcg total) into inhaler and inhale daily.  Modified Medications   No medications on file  Discontinued Medications   FLUTICASONE-SALMETEROL (ADVAIR) 100-50 MCG/DOSE AEPB    Reported on 04/18/2016   MECLIZINE (ANTIVERT) 12.5 MG TABLET    Take 12.5 mg by mouth every 6 (six) hours as needed for dizziness. Reported on 04/18/2016   POLYVINYL ALCOHOL (LIQUIFILM TEARS) 1.4 % OPHTHALMIC SOLUTION    Place 1 drop into both eyes as needed for dry eyes. Reported on 04/18/2016    Review of Systems  Respiratory: Positive for shortness of breath.   Cardiovascular: Positive for leg swelling.  Genitourinary: Positive for frequency.  Musculoskeletal: Positive for arthralgias, gait problem and neck pain.  Neurological: Positive for tremors and numbness.    Filed Vitals:   04/18/16 0901  BP: 125/79  Pulse: 74  Temp: 98.3 F (36.8 C)  TempSrc: Oral  Resp: 20  Height: 5\' 3"  (1.6 m)  Weight: 247 lb (112.038 kg)   Body  mass index is 43.76 kg/(m^2).  Physical Exam  Constitutional: She is oriented to person, place, and time. She appears well-developed and well-nourished.  Sitting in w/c in NAD, [place] O2 intact  HENT:  Mouth/Throat: Oropharynx is clear and moist. No oropharyngeal exudate.  Eyes: Pupils are equal, round, and reactive to light. No scleral icterus.  Neck: Neck supple. No spinous process tenderness and no muscular tenderness present. Carotid bruit is not present. Decreased range of motion present. No tracheal deviation present. No thyromegaly present.    Cardiovascular: Normal rate, regular rhythm and intact distal pulses.  Exam reveals no gallop and no friction rub.   Murmur (1/6 SEM) heard. Trace  LE edema b/l. No calf TTP  Pulmonary/Chest: Effort normal. No stridor. No respiratory distress. She has no wheezes. She has no rales.  Reduced BS b/l at base  Abdominal: Soft. Bowel sounds are normal. She exhibits no distension and no mass. There is no hepatomegaly. There is no tenderness. There is no rebound and no guarding. A hernia (at RUQ incisional scar; reducible; NT) is present.    Musculoskeletal: She exhibits edema.  Lymphadenopathy:    She has no cervical adenopathy.  Neurological: She is alert and oriented to person, place, and time. She has normal reflexes. She displays tremor.  Skin: Skin is warm and dry. Rash (chronic venous stasis changes b/l  LE) noted.  Psychiatric: Her behavior is normal. Her mood appears anxious.     Labs reviewed: Nursing Home on 01/19/2016  Component Date Value Ref Range Status  . Triglycerides 12/08/2015 181* 40 - 160 mg/dL Final  . Cholesterol 12/08/2015 160  0 - 200 mg/dL Final  . HDL 12/08/2015 32* 35 - 70 mg/dL Final  . LDL Cholesterol 12/08/2015 92   Final    No results found.   Assessment/Plan   ICD-9-CM ICD-10-CM   1. Incisional hernia, without obstruction or gangrene 553.21 K43.2    Reducible; RUQ   2. GERD without esophagitis 530.81  K21.9   3. Centrilobular emphysema ([place]) 492.8 J43.2  4. Chronic respiratory failure with hypoxia (HCC) 518.83 J96.11    799.02     on [place] O2 ATC  5. Chronic diastolic congestive heart failure (HCC) 428.32 I50.32    428.0    6. Cervical myelopathy with cervical radiculopathy 721.1 M47.12   7. Hypothyroidism due to acquired atrophy of thyroid 244.8 E03.8    246.8 E03.4   8. Essential hypertension 401.9 I10   9. Anxiety state 300.00 F41.1     Observation warranted for hernia. She is asymptomatic at this time and no signs of incarceration/strangulation  Cont current meds as ordered  Check CMP  Psych services to follow  PT/OT as indicated  F/u with specialists as scheduled  Will follow  Burdell Peed S. Perlie Gold  [facility] and Adult Medicine [address] [place], [place] [address] [phone] Cell (Monday-Friday 8 AM - 5 PM) [phone] After 5 PM and follow prompts

## 2016-05-01 LAB — BASIC METABOLIC PANEL
BUN: 20 mg/dL (ref 4–21)
Creatinine: 0.9 mg/dL (ref 0.5–1.1)
Glucose: 123 mg/dL
Potassium: 4.4 mmol/L (ref 3.4–5.3)
SODIUM: 141 mmol/L (ref 137–147)

## 2016-05-01 LAB — CBC AND DIFFERENTIAL
HEMATOCRIT: 35 % — AB (ref 36–46)
HEMOGLOBIN: 11.6 g/dL — AB (ref 12.0–16.0)
PLATELETS: 270 10*3/uL (ref 150–399)
WBC: 8.2 10^3/mL

## 2016-05-13 ENCOUNTER — Non-Acute Institutional Stay (SKILLED_NURSING_FACILITY): Payer: Medicare Other | Admitting: Nurse Practitioner

## 2016-05-13 ENCOUNTER — Encounter: Payer: Self-pay | Admitting: Nurse Practitioner

## 2016-05-13 DIAGNOSIS — J449 Chronic obstructive pulmonary disease, unspecified: Secondary | ICD-10-CM

## 2016-05-13 DIAGNOSIS — I5032 Chronic diastolic (congestive) heart failure: Secondary | ICD-10-CM

## 2016-05-13 DIAGNOSIS — E034 Atrophy of thyroid (acquired): Secondary | ICD-10-CM

## 2016-05-13 DIAGNOSIS — E038 Other specified hypothyroidism: Secondary | ICD-10-CM

## 2016-05-13 DIAGNOSIS — K219 Gastro-esophageal reflux disease without esophagitis: Secondary | ICD-10-CM

## 2016-05-13 DIAGNOSIS — F411 Generalized anxiety disorder: Secondary | ICD-10-CM | POA: Diagnosis not present

## 2016-05-13 NOTE — Progress Notes (Signed)
Nursing Home Location:  [facility]  Place of Service: SNF (31)  PCP: Gildardo Cranker, DO  No Known Allergies  Chief Complaint  Patient presents with  . Medical Management of Chronic Issues    Routine Visit    HPI:  Patient is a 82 y.o. female seen today at [facility] for routine follow up on chronic condition. Pt with past medical history of COPD, HTN, hypothyroidism, CHF, and CKD. Pt has followed up with neurology due to numbness and tingling in arms and hands. Offered neuro surgery consult but pt does not feel like she would be a good surgical candidate which is appropriate due to CHF and COPD. Staff notes she is not taking her lasix consistently due to increase urination associated with diuretic. Pt states she has been complaint with this medication when questioning her about compliance. Looking back lasix was increased when pt had acute episode of shortness of breath and was thought to be associated with CHF and COPD exacerbation.   Review of Systems:  Review of Systems  Constitutional: Negative for fever, chills and fatigue.  HENT: Negative for congestion, postnasal drip and rhinorrhea.   Respiratory: Negative for cough, shortness of breath and wheezing.   Cardiovascular: Negative for chest pain, palpitations and leg swelling.  Gastrointestinal: Negative for abdominal pain, diarrhea and constipation.  Genitourinary: Positive for frequency. Negative for dysuria, hematuria and flank pain.  Musculoskeletal: Negative for myalgias, back pain and arthralgias.  Neurological: Positive for numbness (to bilateral upper extermities ). Negative for dizziness and light-headedness.    Past Medical History  Diagnosis Date  . COPD (chronic obstructive pulmonary disease) ([place])   . Breast CA ([place])   . Hypertension   . HTN (hypertension) 01/18/2014  . Hypothyroidism 01/18/2014  . Medical history non-contributory   . Shortness of breath   . Pneumonia   . Diastolic CHF  ([place]) 99991111  . CKD (chronic kidney disease) stage 3, GFR 30-59 ml/min 12/15/2014  . Anxiety state 02/25/2015  . GERD without esophagitis 01/19/2016  . Spondylosis, cervical, with myelopathy 04/11/2016   Past Surgical History  Procedure Laterality Date  . Cholecystectomy    . Breast surgery    . Mastectomy Left   . Esophagogastroduodenoscopy N/A 01/19/2014    Procedure: ESOPHAGOGASTRODUODENOSCOPY (EGD);  Surgeon: Jeryl Columbia, MD;  Location: [facility];  Service: Endoscopy;  Laterality: N/A;  . Colonoscopy N/A 01/20/2014    Procedure: COLONOSCOPY;  Surgeon: Winfield Cunas., MD;  Location: [facility];  Service: Endoscopy;  Laterality: N/A;   Social History:   reports that she quit smoking about 16 years ago. Her smoking use included Cigarettes. She has a 100 pack-year smoking history. She has never used smokeless tobacco. She reports that she does not drink alcohol or use illicit drugs.  Family History  Problem Relation Age of Onset  . Lung cancer Sister   . Breast cancer Sister   . Breast cancer Daughter   . Breast cancer Sister     Medications: Patient's Medications  New Prescriptions   No medications on file  Previous Medications   ACETAMINOPHEN (TYLENOL) 325 MG TABLET    Take 650 mg by mouth every 6 (six) hours as needed. Reported on 04/11/2016   ALBUTEROL (PROVENTIL HFA;VENTOLIN HFA) 108 (90 BASE) MCG/ACT INHALER    Inhale 2 puffs into the lungs every 6 (six) hours as needed for wheezing or shortness of breath. Reported on 04/11/2016   ALBUTEROL (PROVENTIL) (2.5 MG/3ML) 0.083% NEBULIZER SOLUTION  Take 3 mLs (2.5 mg total) by nebulization every 6 (six) hours as needed for wheezing or shortness of breath.   BENZONATATE (TESSALON) 100 MG CAPSULE    Take 1 capsule (100 mg total) by mouth 3 (three) times daily as needed for cough.   BISACODYL (DULCOLAX) 10 MG SUPPOSITORY    Place 10 mg rectally as needed for moderate constipation. If not relieved by Bisacodyl give Saline Enema     BUDESONIDE (PULMICORT) 0.25 MG/2ML NEBULIZER SOLUTION    Take 0.25 mg by nebulization 2 (two) times daily.   CHOLECALCIFEROL (VITAMIN D) 1000 UNITS TABLET    Take 1,000 Units by mouth daily. Reported on 04/11/2016   DEXTROMETHORPHAN-GUAIFENESIN (MUCINEX DM MAXIMUM STRENGTH) 60-1200 MG TB12    Take 1 capsule by mouth 2 (two) times daily. Reported on 04/11/2016   DULOXETINE (CYMBALTA) 60 MG CAPSULE    Take 1 capsule (60 mg total) by mouth daily.   FLUTICASONE-SALMETEROL (ADVAIR) 500-50 MCG/DOSE AEPB    Inhale 1 puff into the lungs 2 (two) times daily.   FUROSEMIDE (LASIX) 40 MG TABLET    Take 2 tablets (80 mg total) by mouth daily.   GABAPENTIN (NEURONTIN) 300 MG CAPSULE    Take 300 mg by mouth 3 (three) times daily.    HYDROCHLOROTHIAZIDE (HYDRODIURIL) 25 MG TABLET    Take 25 mg by mouth daily.   LEVOTHYROXINE (SYNTHROID, LEVOTHROID) 75 MCG TABLET    Take 75 mcg by mouth daily before breakfast.   LISINOPRIL (PRINIVIL,ZESTRIL) 10 MG TABLET    Take 1 tablet (10 mg total) by mouth daily.   LORAZEPAM (ATIVAN) 0.5 MG TABLET    Take one tablet by mouth every 8 hours as needed for anxiety   MAGNESIUM HYDROXIDE (MILK OF MAGNESIA) 400 MG/5ML SUSPENSION    Take 30 mLs by mouth once. Reported on 04/11/2016   MELATONIN 3 MG TABS    Take by mouth at bedtime.   MULTIPLE VITAMINS-MINERALS (MULTIVITAMIN WITH MINERALS) TABLET    Take 1 tablet by mouth daily.   OXYCODONE (OXY IR/ROXICODONE) 5 MG IMMEDIATE RELEASE TABLET    Take one tablet by mouth every 4 hours as needed for pain   OXYGEN    Inhale into the lungs. Nasal O2 2L/min for hypoxia   PANTOPRAZOLE (PROTONIX) 40 MG TABLET    Take 1 tablet (40 mg total) by mouth daily at 12 noon.   SODIUM PHOSPHATES (RA SALINE ENEMA RE)    Place rectally. Reported on 04/11/2016   TIOTROPIUM (SPIRIVA HANDIHALER) 18 MCG INHALATION CAPSULE    Place 1 capsule (18 mcg total) into inhaler and inhale daily.  Modified Medications   No medications on file  Discontinued Medications    No medications on file     Physical Exam: Filed Vitals:   05/13/16 1149  BP: 125/79  Pulse: 82  Temp: 98.3 F (36.8 C)  TempSrc: Oral  Resp: 18  Height: 5\' 3"  (1.6 m)  Weight: 246 lb 3.2 oz (111.676 kg)    Physical Exam  Constitutional: She is oriented to person, place, and time. She appears well-developed and well-nourished. No distress.  HENT:  Head: Normocephalic and atraumatic.  Mouth/Throat: Oropharynx is clear and moist. No oropharyngeal exudate.  Eyes: Pupils are equal, round, and reactive to light.  Neck: Normal range of motion. Neck supple.  Cardiovascular: Normal rate, regular rhythm and normal heart sounds.   No murmur heard. Pulmonary/Chest: Effort normal and breath sounds normal.  Abdominal: Soft. Bowel sounds are normal. She exhibits no  distension.  Musculoskeletal: She exhibits no edema.  Lymphadenopathy:    She has no cervical adenopathy.  Neurological: She is alert and oriented to person, place, and time.  Tremor   Skin: Skin is warm and dry. She is not diaphoretic.  Psychiatric: She has a normal mood and affect.    Labs reviewed: Basic Metabolic Panel:  Recent Labs  11/10/15  NA 137  K 4.2  BUN 17  CREATININE 0.8   Liver Function Tests:  Recent Labs  11/10/15  AST 18  ALT 16  ALKPHOS 61   No results for input(s): LIPASE, AMYLASE in the last 8760 hours. No results for input(s): AMMONIA in the last 8760 hours. CBC:  Recent Labs  11/10/15  WBC 8.1  HGB 12.0  HCT 38  PLT 262   TSH:  Recent Labs  08/31/15 11/02/15 11/10/15  TSH 6.21* 3.14 4.26   A1C: Lab Results  Component Value Date   HGBA1C 6.5* 10/26/2014   Lipid Panel:  Recent Labs  12/08/15  CHOL 160  HDL 32*  LDLCALC 92  TRIG 181*   IMPRESSION:  Abnormal MRI cervical spine (without) demonstrating: 1. At C6-7: disc bulging and facet hypertrophy with moderate-severe spinal stenosis, moderate right and severe left foraminal stenosis  2. At C5-6: disc bulging  and facet hypertrophy with moderate-severe spinal stenosis and moderate biforaminal stenosis  3. At C4-5: uncovertebral joint hypertrophy with mild biforaminal stenosis  4. At C3-4: uncovertebral joint hypertrophy with mild left foraminal stenosis  5. Patient motion artifact limits this study. Consider repeat scan with sedation if indicated.  Assessment/Plan 1. Diastolic CHF, chronic ([place]) Staff reports non compliance with diuretic, will decrease to lasix 40 mg daily in hopes to help with compliance.  -follow up BMP  2. Chronic obstructive pulmonary disease, unspecified COPD type ([place]) Without worsening cough, congestion or shortness of breath. conts on long term O2. conts on mucinex DM, and  pulmicort  3. Anxiety state Stable on cymbalta  4. Hypothyroidism due to acquired atrophy of thyroid Stable conts on synthroid 75 mcg  5. GERD without esophagitis To cont lifestyle modifications and protonix 40 mg daily     Chantale Leugers K. Harle Battiest  [facility] & Adult Medicine [phone] 8 am - 5 pm) [phone] (after hours)

## 2016-05-15 LAB — HEPATIC FUNCTION PANEL
ALK PHOS: 52 U/L (ref 25–125)
ALT: 14 U/L (ref 7–35)
AST: 15 U/L (ref 13–35)
Bilirubin, Total: 0.4 mg/dL

## 2016-05-15 LAB — BASIC METABOLIC PANEL
BUN: 26 mg/dL — AB (ref 4–21)
CREATININE: 0.8 mg/dL (ref 0.5–1.1)
Glucose: 107 mg/dL
POTASSIUM: 4.3 mmol/L (ref 3.4–5.3)
SODIUM: 142 mmol/L (ref 137–147)

## 2016-05-15 LAB — CBC AND DIFFERENTIAL
HCT: 36 % (ref 36–46)
Hemoglobin: 11.6 g/dL — AB (ref 12.0–16.0)
PLATELETS: 241 10*3/uL (ref 150–399)
WBC: 7.7 10^3/mL

## 2016-05-26 LAB — BASIC METABOLIC PANEL WITH GFR
BUN: 26 mg/dL — AB (ref 4–21)
Creatinine: 0.9 mg/dL (ref 0.5–1.1)
Glucose: 116 mg/dL
Potassium: 4.5 mmol/L (ref 3.4–5.3)
Sodium: 139 mmol/L (ref 137–147)

## 2016-06-08 ENCOUNTER — Non-Acute Institutional Stay (SKILLED_NURSING_FACILITY): Payer: Medicare Other | Admitting: Nurse Practitioner

## 2016-06-08 DIAGNOSIS — R079 Chest pain, unspecified: Secondary | ICD-10-CM | POA: Diagnosis not present

## 2016-06-08 DIAGNOSIS — K219 Gastro-esophageal reflux disease without esophagitis: Secondary | ICD-10-CM

## 2016-06-08 DIAGNOSIS — I5032 Chronic diastolic (congestive) heart failure: Secondary | ICD-10-CM | POA: Diagnosis not present

## 2016-06-08 DIAGNOSIS — J449 Chronic obstructive pulmonary disease, unspecified: Secondary | ICD-10-CM

## 2016-06-08 NOTE — Progress Notes (Signed)
Nursing Home Location:  [facility]  Place of Service: SNF (31)  PCP: Unice Cobble, MD  No Known Allergies  Chief Complaint  Patient presents with  . Medical Management of Chronic Issues    Routine Visit    HPI:  Patient is a 82 y.o. female seen today at [facility] for routine follow up on chronic condition. Pt with past medical history of COPD, HTN, hypothyroidism, CHF, and CKD. Pt reports she has been complaining to nursing about recent chest pains to left upper chest that catches her breath at times. Last a few mins and then resolves. Has been off and on the last few days. No current complaints of chest pains or worsening shortness of breath. Denies GERD.  Reports dry eyes, currently not on eye drops but has been on drops in the past and would like eye drops.    Review of Systems:  Review of Systems  Constitutional: Negative for chills, fatigue and fever.  HENT: Negative for congestion, postnasal drip and rhinorrhea.   Eyes:       Dry eyes  Respiratory: Negative for cough, shortness of breath and wheezing.   Cardiovascular: Positive for chest pain. Negative for palpitations and leg swelling.  Gastrointestinal: Negative for abdominal pain, constipation and diarrhea.  Genitourinary: Positive for frequency. Negative for dysuria, flank pain and hematuria.  Musculoskeletal: Negative for arthralgias, back pain and myalgias.  Neurological: Positive for numbness (to bilateral upper extermities ). Negative for dizziness and light-headedness.    Past Medical History:  Diagnosis Date  . Anxiety state 02/25/2015  . Breast CA ([place])   . CKD (chronic kidney disease) stage 3, GFR 30-59 ml/min 12/15/2014  . COPD (chronic obstructive pulmonary disease) ([place])   . Diastolic CHF ([place]) 99991111  . GERD without esophagitis 01/19/2016  . HTN (hypertension) 01/18/2014  . Hypertension   . Hypothyroidism 01/18/2014  . Medical history non-contributory   . Pneumonia   . Shortness  of breath   . Spondylosis, cervical, with myelopathy 04/11/2016   Past Surgical History:  Procedure Laterality Date  . BREAST SURGERY    . CHOLECYSTECTOMY    . COLONOSCOPY N/A 01/20/2014   Procedure: COLONOSCOPY;  Surgeon: Winfield Cunas., MD;  Location: [facility];  Service: Endoscopy;  Laterality: N/A;  . ESOPHAGOGASTRODUODENOSCOPY N/A 01/19/2014   Procedure: ESOPHAGOGASTRODUODENOSCOPY (EGD);  Surgeon: Jeryl Columbia, MD;  Location: [facility];  Service: Endoscopy;  Laterality: N/A;  . MASTECTOMY Left    Social History:   reports that she quit smoking about 16 years ago. Her smoking use included Cigarettes. She has a 100.00 pack-year smoking history. She has never used smokeless tobacco. She reports that she does not drink alcohol or use drugs.  Family History  Problem Relation Age of Onset  . Lung cancer Sister   . Breast cancer Sister   . Breast cancer Sister   . Breast cancer Daughter     Medications: Patient's Medications  New Prescriptions   No medications on file  Previous Medications   ACETAMINOPHEN (TYLENOL) 325 MG TABLET    Take 650 mg by mouth every 6 (six) hours as needed. Reported on 04/11/2016   ALBUTEROL (PROVENTIL HFA;VENTOLIN HFA) 108 (90 BASE) MCG/ACT INHALER    Inhale 2 puffs into the lungs every 6 (six) hours as needed for wheezing or shortness of breath. Reported on 04/11/2016   ALBUTEROL (PROVENTIL) (2.5 MG/3ML) 0.083% NEBULIZER SOLUTION    Take 3 mLs (2.5 mg total) by nebulization every 6 (  six) hours as needed for wheezing or shortness of breath.   BENZONATATE (TESSALON) 100 MG CAPSULE    Take 1 capsule (100 mg total) by mouth 3 (three) times daily as needed for cough.   BISACODYL (DULCOLAX) 10 MG SUPPOSITORY    Place 10 mg rectally as needed for moderate constipation. If not relieved by Bisacodyl give Saline Enema   BUDESONIDE (PULMICORT) 0.25 MG/2ML NEBULIZER SOLUTION    Take 0.25 mg by nebulization 2 (two) times daily.   CHOLECALCIFEROL (VITAMIN D) 1000  UNITS TABLET    Take 1,000 Units by mouth daily. Reported on 04/11/2016   DEXTROMETHORPHAN-GUAIFENESIN (MUCINEX DM MAXIMUM STRENGTH) 60-1200 MG TB12    Take 1 capsule by mouth 2 (two) times daily. Reported on 04/11/2016   DULOXETINE (CYMBALTA) 60 MG CAPSULE    Take 1 capsule (60 mg total) by mouth daily.   FLUTICASONE-SALMETEROL (ADVAIR) 500-50 MCG/DOSE AEPB    Inhale 1 puff into the lungs 2 (two) times daily.   FUROSEMIDE (LASIX) 40 MG TABLET    Take 2 tablets (80 mg total) by mouth daily.   GABAPENTIN (NEURONTIN) 300 MG CAPSULE    Take 300 mg by mouth 3 (three) times daily.    HYDROCHLOROTHIAZIDE (HYDRODIURIL) 25 MG TABLET    Take 25 mg by mouth daily.   LEVOTHYROXINE (SYNTHROID, LEVOTHROID) 75 MCG TABLET    Take 75 mcg by mouth daily before breakfast.   LISINOPRIL (PRINIVIL,ZESTRIL) 10 MG TABLET    Take 1 tablet (10 mg total) by mouth daily.   LORAZEPAM (ATIVAN) 0.5 MG TABLET    Take one tablet by mouth every 8 hours as needed for anxiety   MAGNESIUM HYDROXIDE (MILK OF MAGNESIA) 400 MG/5ML SUSPENSION    Take 30 mLs by mouth once. Reported on 04/11/2016   MELATONIN 3 MG TABS    Take by mouth at bedtime.   MULTIPLE VITAMINS-MINERALS (MULTIVITAMIN WITH MINERALS) TABLET    Take 1 tablet by mouth daily.   OXYCODONE (OXY IR/ROXICODONE) 5 MG IMMEDIATE RELEASE TABLET    Take one tablet by mouth every 4 hours as needed for pain   OXYGEN    Inhale into the lungs. Nasal O2 2L/min for hypoxia   PANTOPRAZOLE (PROTONIX) 40 MG TABLET    Take 1 tablet (40 mg total) by mouth daily at 12 noon.   SODIUM PHOSPHATES (RA SALINE ENEMA RE)    Place rectally. Reported on 04/11/2016   TIOTROPIUM (SPIRIVA HANDIHALER) 18 MCG INHALATION CAPSULE    Place 1 capsule (18 mcg total) into inhaler and inhale daily.  Modified Medications   No medications on file  Discontinued Medications   No medications on file     Physical Exam: Vitals:   06/08/16 1052  BP: 130/74  Pulse: 74  Resp: 20  Temp: 98.4 F (36.9 C)  SpO2: 99%   Weight: 246 lb 9.6 oz (111.9 kg)  Height: 5\' 3"  (1.6 m)    Physical Exam  Constitutional: She is oriented to person, place, and time. She appears well-developed and well-nourished. No distress.  HENT:  Head: Normocephalic and atraumatic.  Mouth/Throat: Oropharynx is clear and moist. No oropharyngeal exudate.  Eyes: Pupils are equal, round, and reactive to light.  Neck: Normal range of motion. Neck supple.  Cardiovascular: Normal rate, regular rhythm and normal heart sounds.   No murmur heard. Pulmonary/Chest: Effort normal and breath sounds normal.  Abdominal: Soft. Bowel sounds are normal. She exhibits no distension.  Musculoskeletal: She exhibits no edema.  Lymphadenopathy:  She has no cervical adenopathy.  Neurological: She is alert and oriented to person, place, and time.  Tremor   Skin: Skin is warm and dry. She is not diaphoretic.  Psychiatric: She has a normal mood and affect.    Labs reviewed: Basic Metabolic Panel:  Recent Labs  05/01/16 05/15/16 05/26/16  NA 141 142 139  K 4.4 4.3 4.5  BUN 20 26* 26*  CREATININE 0.9 0.8 0.9   Liver Function Tests:  Recent Labs  11/10/15 05/15/16  AST 18 15  ALT 16 14  ALKPHOS 61 52   No results for input(s): LIPASE, AMYLASE in the last 8760 hours. No results for input(s): AMMONIA in the last 8760 hours. CBC:  Recent Labs  11/10/15 05/01/16 05/15/16  WBC 8.1 8.2 7.7  HGB 12.0 11.6* 11.6*  HCT 38 35* 36  PLT 262 270 241   TSH:  Recent Labs  08/31/15 11/02/15 11/10/15  TSH 6.21* 3.14 4.26   A1C: Lab Results  Component Value Date   HGBA1C 6.5 (H) 10/26/2014   Lipid Panel:  Recent Labs  12/08/15  CHOL 160  HDL 32*  LDLCALC 92  TRIG 181*   IMPRESSION:  Abnormal MRI cervical spine (without) demonstrating: 1. At C6-7: disc bulging and facet hypertrophy with moderate-severe spinal stenosis, moderate right and severe left foraminal stenosis  2. At C5-6: disc bulging and facet hypertrophy with  moderate-severe spinal stenosis and moderate biforaminal stenosis  3. At C4-5: uncovertebral joint hypertrophy with mild biforaminal stenosis  4. At C3-4: uncovertebral joint hypertrophy with mild left foraminal stenosis  5. Patient motion artifact limits this study. Consider repeat scan with sedation if indicated.  Assessment/Plan 1. Chest pain, unspecified chest pain type VSS and no current chest pains, will get EKG to evaluate for changes.  -chest xray due to hx of COPD to rule out pneumonia or fluid -with hx of CHF, pt not seeing cardiologist at this time, will get cardiology referral   2. Chronic obstructive pulmonary disease, unspecified COPD type ([place]) conts on spirva and Pulmicort, without significant worsening of shortness of breath, cough or congestion.   3. Diastolic CHF, chronic (HCC) Lasix reduced to 40 mg daily to help with compliance no fluid retention noted. Will have pt follow up with a cardiologist at this time  4. GERD without esophagitis Stable, no recent GERD    Malikiah Debarr K. Harle Battiest  [facility] [phone] 8 am - 5 pm) [phone] (after hours)

## 2016-06-15 ENCOUNTER — Encounter: Payer: Self-pay | Admitting: Nurse Practitioner

## 2016-06-15 ENCOUNTER — Non-Acute Institutional Stay (SKILLED_NURSING_FACILITY): Payer: Medicare Other | Admitting: Nurse Practitioner

## 2016-06-15 DIAGNOSIS — I5032 Chronic diastolic (congestive) heart failure: Secondary | ICD-10-CM | POA: Diagnosis not present

## 2016-06-15 DIAGNOSIS — R079 Chest pain, unspecified: Secondary | ICD-10-CM | POA: Diagnosis not present

## 2016-06-15 DIAGNOSIS — J449 Chronic obstructive pulmonary disease, unspecified: Secondary | ICD-10-CM

## 2016-06-15 NOTE — Progress Notes (Addendum)
Nursing Home Location:  [facility]  Place of Service: SNF (31)  PCP: Unice Cobble, MD  No Known Allergies  Chief Complaint  Patient presents with  . Acute Visit    Refusing medications    HPI:  Patient is a 82 y.o. female seen today at [facility] for noncompliance with medication. Pt with past medical history of COPD, HTN, hypothyroidism, CHF, and CKD. Staff concerned because pt is not taking her lasix, she has been refusing all diuretics more frequently. Pt has taken lasix 10 days out of the last 23. originally pt reported she was taking her lasix however today Med tech, nurse, pt and myself met and pt admitted she would ask them to take out diuretic due to increase urination. Pt denies increase in shortness of breath or swelling. Pt reports she has not any more chest pains since last visit. Noted change on EKG from previous and cardiology referral has been placed.   Review of Systems:  Review of Systems  Constitutional: Negative for chills, fatigue and fever.  HENT: Negative for congestion, postnasal drip and rhinorrhea.   Eyes:       Dry eyes  Respiratory: Positive for cough and shortness of breath. Negative for wheezing.        Chronic shortness of breath with cough, no changes in symptoms   Cardiovascular: Negative for chest pain, palpitations and leg swelling.  Gastrointestinal: Negative for abdominal pain, constipation and diarrhea.  Genitourinary: Positive for frequency. Negative for dysuria, flank pain and hematuria.  Musculoskeletal: Negative for arthralgias, back pain and myalgias.  Neurological: Positive for numbness (to bilateral upper extermities ). Negative for dizziness and light-headedness.    Past Medical History:  Diagnosis Date  . Anxiety state 02/25/2015  . Breast CA ([place])   . CKD (chronic kidney disease) stage 3, GFR 30-59 ml/min 12/15/2014  . COPD (chronic obstructive pulmonary disease) ([place])   . Diastolic CHF ([place]) 03/02/2923  . GERD  without esophagitis 01/19/2016  . HTN (hypertension) 01/18/2014  . Hypertension   . Hypothyroidism 01/18/2014  . Medical history non-contributory   . Pneumonia   . Shortness of breath   . Spondylosis, cervical, with myelopathy 04/11/2016   Past Surgical History:  Procedure Laterality Date  . BREAST SURGERY    . CHOLECYSTECTOMY    . COLONOSCOPY N/A 01/20/2014   Procedure: COLONOSCOPY;  Surgeon: Winfield Cunas., MD;  Location: [facility];  Service: Endoscopy;  Laterality: N/A;  . ESOPHAGOGASTRODUODENOSCOPY N/A 01/19/2014   Procedure: ESOPHAGOGASTRODUODENOSCOPY (EGD);  Surgeon: Jeryl Columbia, MD;  Location: [facility];  Service: Endoscopy;  Laterality: N/A;  . MASTECTOMY Left    Social History:   reports that she quit smoking about 16 years ago. Her smoking use included Cigarettes. She has a 100.00 pack-year smoking history. She has never used smokeless tobacco. She reports that she does not drink alcohol or use drugs.  Family History  Problem Relation Age of Onset  . Lung cancer Sister   . Breast cancer Sister   . Breast cancer Sister   . Breast cancer Daughter     Medications: Patient's Medications  New Prescriptions   No medications on file  Previous Medications   ACETAMINOPHEN (TYLENOL) 325 MG TABLET    Take 650 mg by mouth every 6 (six) hours as needed. Reported on 04/11/2016   ALBUTEROL (PROVENTIL HFA;VENTOLIN HFA) 108 (90 BASE) MCG/ACT INHALER    Inhale 2 puffs into the lungs every 6 (six) hours as needed for  wheezing or shortness of breath. Reported on 04/11/2016   ALBUTEROL (PROVENTIL) (2.5 MG/3ML) 0.083% NEBULIZER SOLUTION    Take 3 mLs (2.5 mg total) by nebulization every 6 (six) hours as needed for wheezing or shortness of breath.   BENZONATATE (TESSALON) 100 MG CAPSULE    Take 1 capsule (100 mg total) by mouth 3 (three) times daily as needed for cough.   BISACODYL (DULCOLAX) 10 MG SUPPOSITORY    Place 10 mg rectally as needed for moderate constipation. If not relieved  by Bisacodyl give Saline Enema   BUDESONIDE (PULMICORT) 0.25 MG/2ML NEBULIZER SOLUTION    Take 0.25 mg by nebulization 2 (two) times daily.   CHOLECALCIFEROL (VITAMIN D) 1000 UNITS TABLET    Take 1,000 Units by mouth daily. Reported on 04/11/2016   DEXTROMETHORPHAN-GUAIFENESIN (MUCINEX DM MAXIMUM STRENGTH) 60-1200 MG TB12    Take 1 capsule by mouth 2 (two) times daily. Reported on 04/11/2016   DULOXETINE (CYMBALTA) 60 MG CAPSULE    Take 1 capsule (60 mg total) by mouth daily.   FLUTICASONE-SALMETEROL (ADVAIR) 500-50 MCG/DOSE AEPB    Inhale 1 puff into the lungs 2 (two) times daily.   FUROSEMIDE (LASIX) 40 MG TABLET    Take 40 mg by mouth daily.   GABAPENTIN (NEURONTIN) 300 MG CAPSULE    Take 300 mg by mouth 3 (three) times daily.    LEVOTHYROXINE (SYNTHROID, LEVOTHROID) 75 MCG TABLET    Take 75 mcg by mouth daily before breakfast.   LISINOPRIL (PRINIVIL,ZESTRIL) 10 MG TABLET    Take 1 tablet (10 mg total) by mouth daily.   LORAZEPAM (ATIVAN) 0.5 MG TABLET    Take one tablet by mouth every 8 hours as needed for anxiety   MAGNESIUM HYDROXIDE (MILK OF MAGNESIA) 400 MG/5ML SUSPENSION    Take 30 mLs by mouth once. Reported on 04/11/2016   MELATONIN 3 MG TABS    Take by mouth at bedtime.   MULTIPLE VITAMINS-MINERALS (MULTIVITAMIN WITH MINERALS) TABLET    Take 1 tablet by mouth daily.   OXYCODONE (OXY IR/ROXICODONE) 5 MG IMMEDIATE RELEASE TABLET    Take one tablet by mouth every 4 hours as needed for pain   OXYGEN    Inhale into the lungs. Nasal O2 2L/min for hypoxia   PANTOPRAZOLE (PROTONIX) 40 MG TABLET    Take 1 tablet (40 mg total) by mouth daily at 12 noon.   SODIUM PHOSPHATES (RA SALINE ENEMA RE)    Place rectally. Reported on 04/11/2016   TIOTROPIUM (SPIRIVA HANDIHALER) 18 MCG INHALATION CAPSULE    Place 1 capsule (18 mcg total) into inhaler and inhale daily.  Modified Medications   No medications on file  Discontinued Medications   FUROSEMIDE (LASIX) 40 MG TABLET    Take 2 tablets (80 mg total) by  mouth daily.   HYDROCHLOROTHIAZIDE (HYDRODIURIL) 25 MG TABLET    Take 25 mg by mouth daily.     Physical Exam: Vitals:   06/15/16 1007  BP: 129/61  Pulse: 75  Temp: 97.1 F (36.2 C)  TempSrc: Oral  SpO2: 94%  Weight: 248 lb 12.8 oz (112.9 kg)  Height: 5' 3"  (1.6 m)    Physical Exam  Constitutional: She is oriented to person, place, and time. She appears well-developed and well-nourished. No distress.  HENT:  Head: Normocephalic and atraumatic.  Mouth/Throat: Oropharynx is clear and moist. No oropharyngeal exudate.  Eyes: Pupils are equal, round, and reactive to light.  Neck: Normal range of motion. Neck supple.  Cardiovascular: Normal rate,  regular rhythm and normal heart sounds.   No murmur heard. Pulmonary/Chest: Effort normal.  Diminished breath sounds   Abdominal: Soft. Bowel sounds are normal. She exhibits no distension.  Musculoskeletal: She exhibits no edema.  Lymphadenopathy:    She has no cervical adenopathy.  Neurological: She is alert and oriented to person, place, and time.  Tremor   Skin: Skin is warm and dry. She is not diaphoretic.  Psychiatric: She has a normal mood and affect.    Labs reviewed: Basic Metabolic Panel:  Recent Labs  05/01/16 05/15/16 05/26/16  NA 141 142 139  K 4.4 4.3 4.5  BUN 20 26* 26*  CREATININE 0.9 0.8 0.9   Liver Function Tests:  Recent Labs  11/10/15 05/15/16  AST 18 15  ALT 16 14  ALKPHOS 61 52   No results for input(s): LIPASE, AMYLASE in the last 8760 hours. No results for input(s): AMMONIA in the last 8760 hours. CBC:  Recent Labs  11/10/15 05/01/16 05/15/16  WBC 8.1 8.2 7.7  HGB 12.0 11.6* 11.6*  HCT 38 35* 36  PLT 262 270 241   TSH:  Recent Labs  08/31/15 11/02/15 11/10/15  TSH 6.21* 3.14 4.26   A1C: Lab Results  Component Value Date   HGBA1C 6.5 (H) 10/26/2014   Lipid Panel:  Recent Labs  12/08/15  CHOL 160  HDL 32*  LDLCALC 92  TRIG 181*   IMPRESSION:  Abnormal MRI cervical spine  (without) demonstrating: 1. At C6-7: disc bulging and facet hypertrophy with moderate-severe spinal stenosis, moderate right and severe left foraminal stenosis  2. At C5-6: disc bulging and facet hypertrophy with moderate-severe spinal stenosis and moderate biforaminal stenosis  3. At C4-5: uncovertebral joint hypertrophy with mild biforaminal stenosis  4. At C3-4: uncovertebral joint hypertrophy with mild left foraminal stenosis  5. Patient motion artifact limits this study. Consider repeat scan with sedation if indicated.  Assessment/Plan 1. Chest pain, unspecified chest pain type -EKG with diffuse anterolateral ST-T changes new since 10/26/14 noted per Dr Linna Darner, Cardiology consult requested for further evaluation and work up. currently without chest pains.  -will follow up EKG -chest xray without acute change  2. Chronic obstructive pulmonary disease, unspecified COPD type ([place]) Remains on advair, spiriva, pulmicort BID and albuterol PRN, mucinex BID without increase in shortness of breath, cough or congestion  3. Diastolic CHF, chronic ([place]) -in July lasix was reduced to 40 mg by mouth daily from 80 mg to help with compliance - pt was not taking 80 mg and was agreeable to take 40 daily and have daily weights with notify in change of weight, shortness of breath or swelling. Pt has continued to refuse diuretics, despite education on why she needed to take medication. Nursing, med tech, pt and myself met and pt was again educated on the need for diuretic due to her heart failure diagnosis. Discussed routine toileting due to increase in her urination. Pt voiced understanding of need for medication and risk with not taking lasix. Reports she would start taking medication. Cardiology referral also placed.    Carlos American. Harle Battiest  [facility] [phone] 8 am - 5 pm) [phone] (after hours)

## 2016-07-01 ENCOUNTER — Encounter: Payer: Self-pay | Admitting: Nurse Practitioner

## 2016-07-01 ENCOUNTER — Non-Acute Institutional Stay (SKILLED_NURSING_FACILITY): Payer: Medicare Other | Admitting: Nurse Practitioner

## 2016-07-01 DIAGNOSIS — H00011 Hordeolum externum right upper eyelid: Secondary | ICD-10-CM | POA: Diagnosis not present

## 2016-07-01 NOTE — Progress Notes (Signed)
Nursing Home Location:    [facility]   Place of Service:  [place]   PCP: Unice Cobble, MD  No Known Allergies  Chief Complaint  Patient presents with  . Acute Visit    Right eye lid swelling, redness and pain. Patient denies drainage     HPI:  Patient is a 82 y.o. female seen today at  [facility] due to right eyelid. Pt reports eyelid has been red, swollen and tender for a few days. Pt with chronic dry eyes but no new symptoms of drainage or worsening itching. No pain to eye.   Review of Systems:  Review of Systems  Eyes: Positive for itching (dry eyes). Negative for pain, discharge, redness and visual disturbance.       Right eyelid with painful swollen area    Past Medical History:  Diagnosis Date  . Anxiety state 02/25/2015  . Breast CA ([place])   . CKD (chronic kidney disease) stage 3, GFR 30-59 ml/min 12/15/2014  . COPD (chronic obstructive pulmonary disease) ([place])   . Diastolic CHF ([place]) 99991111  . GERD without esophagitis 01/19/2016  . HTN (hypertension) 01/18/2014  . Hypertension   . Hypothyroidism 01/18/2014  . Medical history non-contributory   . Pneumonia   . Shortness of breath   . Spondylosis, cervical, with myelopathy 04/11/2016   Past Surgical History:  Procedure Laterality Date  . BREAST SURGERY    . CHOLECYSTECTOMY    . COLONOSCOPY N/A 01/20/2014   Procedure: COLONOSCOPY;  Surgeon: Winfield Cunas., MD;  Location: [facility];  Service: Endoscopy;  Laterality: N/A;  . ESOPHAGOGASTRODUODENOSCOPY N/A 01/19/2014   Procedure: ESOPHAGOGASTRODUODENOSCOPY (EGD);  Surgeon: Jeryl Columbia, MD;  Location: [facility];  Service: Endoscopy;  Laterality: N/A;  . MASTECTOMY Left    Social History:   reports that she quit smoking about 16 years ago. Her smoking use included Cigarettes. She has a 100.00 pack-year smoking history. She has never used smokeless tobacco. She reports that she does not drink alcohol or use  drugs.  Family History  Problem Relation Age of Onset  . Lung cancer Sister   . Breast cancer Sister   . Breast cancer Sister   . Breast cancer Daughter     Medications: Patient's Medications  New Prescriptions   No medications on file  Previous Medications   ACETAMINOPHEN (TYLENOL) 325 MG TABLET    Take 650 mg by mouth every 6 (six) hours as needed for mild pain. Reported on 04/11/2016   ALBUTEROL (PROVENTIL HFA;VENTOLIN HFA) 108 (90 BASE) MCG/ACT INHALER    Inhale 2 puffs into the lungs every 6 (six) hours as needed for wheezing or shortness of breath. Reported on 04/11/2016   ALBUTEROL (PROVENTIL) (2.5 MG/3ML) 0.083% NEBULIZER SOLUTION    Take 3 mLs (2.5 mg total) by nebulization every 6 (six) hours as needed for wheezing or shortness of breath.   BENZONATATE (TESSALON) 100 MG CAPSULE    Take 1 capsule (100 mg total) by mouth 3 (three) times daily as needed for cough.   BISACODYL (DULCOLAX) 10 MG SUPPOSITORY    Place 10 mg rectally as needed for moderate constipation. If not relieved by Bisacodyl give Saline Enema   BUDESONIDE (PULMICORT) 0.25 MG/2ML NEBULIZER SOLUTION    Take 0.25 mg by nebulization 2 (two) times daily.   CHOLECALCIFEROL (VITAMIN D) 1000 UNITS TABLET    Take 1,000 Units by mouth daily. Reported on 04/11/2016   DEXTROMETHORPHAN-GUAIFENESIN ([place] DM MAXIMUM STRENGTH) 60-1200  MG TB12    Take 1 capsule by mouth 2 (two) times daily. Reported on 04/11/2016   DULOXETINE (CYMBALTA) 60 MG CAPSULE    Take 1 capsule (60 mg total) by mouth daily.   FLUTICASONE-SALMETEROL (ADVAIR) 500-50 MCG/DOSE AEPB    Inhale 1 puff into the lungs 2 (two) times daily.   FUROSEMIDE (LASIX) 40 MG TABLET    Take 40 mg by mouth daily.   GABAPENTIN (NEURONTIN) 300 MG CAPSULE    Take 300 mg by mouth 3 (three) times daily.    HYPROMELLOSE (ARTIFICIAL TEARS OP)    Apply 1 drop to eye 4 (four) times daily as needed (Both Eyes).   LEVOTHYROXINE (SYNTHROID, LEVOTHROID) 75 MCG TABLET    Take 75 mcg by mouth  daily before breakfast.   LISINOPRIL (PRINIVIL,ZESTRIL) 10 MG TABLET    Take 1 tablet (10 mg total) by mouth daily.   LORAZEPAM (ATIVAN) 0.5 MG TABLET    Take one tablet by mouth every 8 hours as needed for anxiety   MAGNESIUM HYDROXIDE (MILK OF MAGNESIA) 400 MG/5ML SUSPENSION    Take 30 mLs by mouth once. Reported on 04/11/2016   MELATONIN 3 MG TABS    Take by mouth at bedtime.   MULTIPLE VITAMINS-MINERALS (MULTIVITAMIN WITH MINERALS) TABLET    Take 1 tablet by mouth daily.   NITROGLYCERIN (NITROSTAT) 0.4 MG SL TABLET    Place 0.4 mg under the tongue every 5 (five) minutes as needed for chest pain.   OXYCODONE (OXY IR/ROXICODONE) 5 MG IMMEDIATE RELEASE TABLET    Take one tablet by mouth every 4 hours as needed for pain   OXYGEN    Inhale into the lungs. Nasal O2 2L/min for hypoxia   PANTOPRAZOLE (PROTONIX) 40 MG TABLET    Take 1 tablet (40 mg total) by mouth daily at 12 noon.   SODIUM PHOSPHATES (RA SALINE ENEMA RE)    Place rectally. Reported on 04/11/2016   TIOTROPIUM (SPIRIVA HANDIHALER) 18 MCG INHALATION CAPSULE    Place 1 capsule (18 mcg total) into inhaler and inhale daily.  Modified Medications   No medications on file  Discontinued Medications   No medications on file     Physical Exam: Vitals:   07/01/16 1058  BP: 125/79  Pulse: 74  Resp: 18  Temp: 98.3 F (36.8 C)  TempSrc: Oral  Weight: 248 lb (112.5 kg)  Height: 5\' 3"  (1.6 m)    Physical Exam  Eyes: Conjunctivae and EOM are normal. Pupils are equal, round, and reactive to light. Right eye exhibits discharge and hordeolum. Left eye exhibits no discharge.    Labs reviewed: Basic Metabolic Panel:  Recent Labs  05/01/16 05/15/16 05/26/16  NA 141 142 139  K 4.4 4.3 4.5  BUN 20 26* 26*  CREATININE 0.9 0.8 0.9   Liver Function Tests:  Recent Labs  11/10/15 05/15/16  AST 18 15  ALT 16 14  ALKPHOS 61 52   No results for input(s): LIPASE, AMYLASE in the last 8760 hours. No results for input(s): AMMONIA in the  last 8760 hours. CBC:  Recent Labs  11/10/15 05/01/16 05/15/16  WBC 8.1 8.2 7.7  HGB 12.0 11.6* 11.6*  HCT 38 35* 36  PLT 262 270 241   TSH:  Recent Labs  08/31/15 11/02/15 11/10/15  TSH 6.21* 3.14 4.26   A1C: Lab Results  Component Value Date   HGBA1C 6.5 (H) 10/26/2014   Lipid Panel:  Recent Labs  12/08/15  CHOL 160  HDL 32*  LDLCALC 92  TRIG 181*      Assessment/Plan 1. Hordeolum externum of right upper eyelid -to use warm compress QID -wash eyelid with baby shampoo TID for 10 days -to avoid makeup to right eye -to notify if drainage occurs or area without improvement or worsening of symptoms     Dawn Montoya K. Harle Battiest  [facility] [phone] 8 am - 5 pm) [phone] (after hours)

## 2016-07-07 ENCOUNTER — Encounter: Payer: Self-pay | Admitting: Internal Medicine

## 2016-07-15 ENCOUNTER — Encounter: Payer: Self-pay | Admitting: Nurse Practitioner

## 2016-07-15 ENCOUNTER — Non-Acute Institutional Stay (SKILLED_NURSING_FACILITY): Payer: Medicare Other | Admitting: Nurse Practitioner

## 2016-07-15 DIAGNOSIS — N183 Chronic kidney disease, stage 3 unspecified: Secondary | ICD-10-CM

## 2016-07-15 DIAGNOSIS — J449 Chronic obstructive pulmonary disease, unspecified: Secondary | ICD-10-CM | POA: Diagnosis not present

## 2016-07-15 DIAGNOSIS — I5032 Chronic diastolic (congestive) heart failure: Secondary | ICD-10-CM | POA: Diagnosis not present

## 2016-07-15 DIAGNOSIS — Z853 Personal history of malignant neoplasm of breast: Secondary | ICD-10-CM | POA: Diagnosis not present

## 2016-07-15 DIAGNOSIS — R739 Hyperglycemia, unspecified: Secondary | ICD-10-CM

## 2016-07-15 NOTE — Progress Notes (Signed)
Nursing Home Location:  [facility]  Place of Service: SNF (31)  PCP: Unice Cobble, MD  No Known Allergies  Chief Complaint  Patient presents with  . Medical Management of Chronic Issues    Routine Visit    HPI:  Patient is a 82 y.o. female seen today at [facility] for routine follow up on chronic condition. Pt with past medical history of COPD, HTN, hypothyroidism, CHF, and CKD. Has been doing well in the last month. Had stye that has now resolved. Complaint with lasix and reports shortness of breath has improved. No chest pains noted.  Reports good mood. No problems with bowel or bladder   Review of Systems:  Review of Systems  Constitutional: Negative for chills, fatigue and fever.  HENT: Negative for congestion, postnasal drip and rhinorrhea.   Eyes:       Dry eyes  Respiratory: Negative for cough, shortness of breath and wheezing.   Cardiovascular: Negative for chest pain, palpitations and leg swelling.  Gastrointestinal: Negative for abdominal pain, constipation and diarrhea.  Genitourinary: Positive for frequency. Negative for dysuria, flank pain and hematuria.  Musculoskeletal: Negative for arthralgias, back pain and myalgias.  Neurological: Positive for numbness (to bilateral upper extermities ). Negative for dizziness and light-headedness.    Past Medical History:  Diagnosis Date  . Anxiety state 02/25/2015  . Breast CA ([place])   . CKD (chronic kidney disease) stage 3, GFR 30-59 ml/min 12/15/2014  . COPD (chronic obstructive pulmonary disease) ([place])   . Diastolic CHF ([place]) 99991111  . GERD without esophagitis 01/19/2016  . Hordeolum externum (stye)    07/01/2016  . HTN (hypertension) 01/18/2014  . Hypertension   . Hypothyroidism 01/18/2014  . Medical history non-contributory   . Pneumonia   . Shortness of breath   . Spondylosis, cervical, with myelopathy 04/11/2016   Past Surgical History:  Procedure Laterality Date  . BREAST SURGERY    .  CHOLECYSTECTOMY    . COLONOSCOPY N/A 01/20/2014   Procedure: COLONOSCOPY;  Surgeon: Winfield Cunas., MD;  Location: [facility];  Service: Endoscopy;  Laterality: N/A;  . ESOPHAGOGASTRODUODENOSCOPY N/A 01/19/2014   Procedure: ESOPHAGOGASTRODUODENOSCOPY (EGD);  Surgeon: Jeryl Columbia, MD;  Location: [facility];  Service: Endoscopy;  Laterality: N/A;  . MASTECTOMY Left    Social History:   reports that she quit smoking about 16 years ago. Her smoking use included Cigarettes. She has a 100.00 pack-year smoking history. She has never used smokeless tobacco. She reports that she does not drink alcohol or use drugs.  Family History  Problem Relation Age of Onset  . Lung cancer Sister   . Breast cancer Sister   . Breast cancer Sister   . Breast cancer Daughter     Medications: Patient's Medications  New Prescriptions   No medications on file  Previous Medications   ACETAMINOPHEN (TYLENOL) 325 MG TABLET    Take 650 mg by mouth every 6 (six) hours as needed for mild pain. Reported on 04/11/2016   ALBUTEROL (PROVENTIL HFA;VENTOLIN HFA) 108 (90 BASE) MCG/ACT INHALER    Inhale 2 puffs into the lungs every 6 (six) hours as needed for wheezing or shortness of breath. Reported on 04/11/2016   ALBUTEROL (PROVENTIL) (2.5 MG/3ML) 0.083% NEBULIZER SOLUTION    Take 3 mLs (2.5 mg total) by nebulization every 6 (six) hours as needed for wheezing or shortness of breath.   BENZONATATE (TESSALON) 100 MG CAPSULE    Take 1 capsule (100 mg total)  by mouth 3 (three) times daily as needed for cough.   BISACODYL (DULCOLAX) 10 MG SUPPOSITORY    Place 10 mg rectally as needed for moderate constipation. If not relieved by Bisacodyl give Saline Enema   BUDESONIDE (PULMICORT) 0.25 MG/2ML NEBULIZER SOLUTION    Take 0.25 mg by nebulization 2 (two) times daily.   CHOLECALCIFEROL (VITAMIN D) 1000 UNITS TABLET    Take 1,000 Units by mouth daily. Reported on 04/11/2016   DEXTROMETHORPHAN-GUAIFENESIN (MUCINEX DM MAXIMUM  STRENGTH) 60-1200 MG TB12    Take 1 capsule by mouth 2 (two) times daily. Reported on 04/11/2016   DULOXETINE (CYMBALTA) 60 MG CAPSULE    Take 1 capsule (60 mg total) by mouth daily.   FLUTICASONE-SALMETEROL (ADVAIR) 500-50 MCG/DOSE AEPB    Inhale 1 puff into the lungs 2 (two) times daily.   FUROSEMIDE (LASIX) 40 MG TABLET    Take 40 mg by mouth daily.   GABAPENTIN (NEURONTIN) 300 MG CAPSULE    Take 300 mg by mouth 3 (three) times daily.    HYPROMELLOSE (ARTIFICIAL TEARS OP)    Apply 1 drop to eye 4 (four) times daily as needed (Both Eyes).   LEVOTHYROXINE (SYNTHROID, LEVOTHROID) 75 MCG TABLET    Take 75 mcg by mouth daily before breakfast.   LISINOPRIL (PRINIVIL,ZESTRIL) 10 MG TABLET    Take 1 tablet (10 mg total) by mouth daily.   LORAZEPAM (ATIVAN) 0.5 MG TABLET    Take one tablet by mouth every 8 hours as needed for anxiety   MAGNESIUM HYDROXIDE (MILK OF MAGNESIA) 400 MG/5ML SUSPENSION    Take 30 mLs by mouth once. Reported on 04/11/2016   MELATONIN 3 MG TABS    Take by mouth at bedtime.   MULTIPLE VITAMINS-MINERALS (MULTIVITAMIN WITH MINERALS) TABLET    Take 1 tablet by mouth daily.   NITROGLYCERIN (NITROSTAT) 0.4 MG SL TABLET    Place 0.4 mg under the tongue every 5 (five) minutes as needed for chest pain.   OXYCODONE (OXY IR/ROXICODONE) 5 MG IMMEDIATE RELEASE TABLET    Take one tablet by mouth every 4 hours as needed for pain   OXYGEN    Inhale into the lungs. Nasal O2 2L/min for hypoxia   PANTOPRAZOLE (PROTONIX) 40 MG TABLET    Take 1 tablet (40 mg total) by mouth daily at 12 noon.   SODIUM PHOSPHATES (RA SALINE ENEMA RE)    Place rectally. Reported on 04/11/2016   TIOTROPIUM (SPIRIVA HANDIHALER) 18 MCG INHALATION CAPSULE    Place 1 capsule (18 mcg total) into inhaler and inhale daily.  Modified Medications   No medications on file  Discontinued Medications   No medications on file     Physical Exam: Vitals:   07/15/16 1258  BP: 122/86  Pulse: 74  Resp: 18  Temp: 98.3 F (36.8 C)   Weight: 248 lb 9.6 oz (112.8 kg)  Height: 5\' 3"  (1.6 m)    Physical Exam  Constitutional: She is oriented to person, place, and time. She appears well-developed and well-nourished. No distress.  HENT:  Head: Normocephalic and atraumatic.  Mouth/Throat: Oropharynx is clear and moist. No oropharyngeal exudate.  Eyes: Pupils are equal, round, and reactive to light.  Neck: Normal range of motion. Neck supple.  Cardiovascular: Normal rate, regular rhythm and normal heart sounds.   No murmur heard. Pulmonary/Chest: Effort normal and breath sounds normal.  Abdominal: Soft. Bowel sounds are normal. She exhibits no distension.  Musculoskeletal: She exhibits no edema.  Lymphadenopathy:  She has no cervical adenopathy.  Neurological: She is alert and oriented to person, place, and time.  Tremor   Skin: Skin is warm and dry. She is not diaphoretic.  Psychiatric: She has a normal mood and affect.    Labs reviewed: Basic Metabolic Panel:  Recent Labs  05/01/16 05/15/16 05/26/16  NA 141 142 139  K 4.4 4.3 4.5  BUN 20 26* 26*  CREATININE 0.9 0.8 0.9   Liver Function Tests:  Recent Labs  11/10/15 05/15/16  AST 18 15  ALT 16 14  ALKPHOS 61 52   No results for input(s): LIPASE, AMYLASE in the last 8760 hours. No results for input(s): AMMONIA in the last 8760 hours. CBC:  Recent Labs  11/10/15 05/01/16 05/15/16  WBC 8.1 8.2 7.7  HGB 12.0 11.6* 11.6*  HCT 38 35* 36  PLT 262 270 241   TSH:  Recent Labs  08/31/15 11/02/15 11/10/15  TSH 6.21* 3.14 4.26   A1C: Lab Results  Component Value Date   HGBA1C 6.5 (H) 10/26/2014   Lipid Panel:  Recent Labs  12/08/15  CHOL 160  HDL 32*  LDLCALC 92  TRIG 181*   IMPRESSION:  Abnormal MRI cervical spine (without) demonstrating: 1. At C6-7: disc bulging and facet hypertrophy with moderate-severe spinal stenosis, moderate right and severe left foraminal stenosis  2. At C5-6: disc bulging and facet hypertrophy with  moderate-severe spinal stenosis and moderate biforaminal stenosis  3. At C4-5: uncovertebral joint hypertrophy with mild biforaminal stenosis  4. At C3-4: uncovertebral joint hypertrophy with mild left foraminal stenosis  5. Patient motion artifact limits this study. Consider repeat scan with sedation if indicated.  Assessment/Plan 1. Diastolic CHF, chronic (HCC) Pt reports compliance with lasix at this time. cardiology consult due to CHF and changes in EKG from 2016, no current chest pains.   2. Hyperglycemia Will get A1c  3. Chronic obstructive pulmonary disease, unspecified COPD type ([place]) COPD remains stable, no increase in shortness of breath, cough or congestion   4. CKD (chronic kidney disease) stage 3, GFR 30-59 ml/min BUN/Cr stable. To avoid dehydration   5. History of breast cancer -hx of left breast cancer s/p mastectomy, will follow up mammogram    Tank Difiore K. Harle Battiest  [facility] [phone] 8 am - 5 pm) [phone] (after hours)

## 2016-07-19 ENCOUNTER — Other Ambulatory Visit: Payer: Self-pay | Admitting: General Surgery

## 2016-07-19 ENCOUNTER — Ambulatory Visit (INDEPENDENT_AMBULATORY_CARE_PROVIDER_SITE_OTHER): Payer: Medicare Other | Admitting: Interventional Cardiology

## 2016-07-19 ENCOUNTER — Encounter: Payer: Self-pay | Admitting: Interventional Cardiology

## 2016-07-19 ENCOUNTER — Other Ambulatory Visit: Payer: Self-pay | Admitting: Internal Medicine

## 2016-07-19 VITALS — BP 110/60 | HR 67 | Ht 63.0 in | Wt 248.0 lb

## 2016-07-19 DIAGNOSIS — I5032 Chronic diastolic (congestive) heart failure: Secondary | ICD-10-CM | POA: Diagnosis not present

## 2016-07-19 DIAGNOSIS — R600 Localized edema: Secondary | ICD-10-CM

## 2016-07-19 DIAGNOSIS — I10 Essential (primary) hypertension: Secondary | ICD-10-CM | POA: Diagnosis not present

## 2016-07-19 NOTE — Progress Notes (Signed)
Cardiology Office Note   Date:  07/19/2016   ID:  Dawn Montoya, DOB 02-Dec-1931, MRN [id number]  PCP:  Unice Cobble, MD    No chief complaint on file. Abnormal ECG   Wt Readings from Last 3 Encounters:  07/19/16 248 lb (112.5 kg)  07/15/16 248 lb 9.6 oz (112.8 kg)  07/01/16 248 lb (112.5 kg)       History of Present Illness: Dawn Montoya is a 82 y.o. female  who has chronic diastolic heart failure. She spends most of her time in a wheelchair due to leg problems. She lives at [place]. It was noted that she had some ECG changes compared to previous years. She had some intermittent chest discomfort a year ago but this resolved. She is weighed every other day. She has not had any recent weight changes or significant leg swelling. Her weight has been stable. She had trouble tolerating higher dosages of diuretics due to issues with incontinence. She is doing better on the lower dose. She is willing to take this. Overall, she feels like she is doing well.    Past Medical History:  Diagnosis Date  . Anxiety state 02/25/2015  . Breast CA ([place])   . CKD (chronic kidney disease) stage 3, GFR 30-59 ml/min 12/15/2014  . COPD (chronic obstructive pulmonary disease) ([place])   . Diastolic CHF ([place]) 99991111  . GERD without esophagitis 01/19/2016  . Hordeolum externum (stye)    07/01/2016  . HTN (hypertension) 01/18/2014  . Hypertension   . Hypothyroidism 01/18/2014  . Medical history non-contributory   . Pneumonia   . Shortness of breath   . Spondylosis, cervical, with myelopathy 04/11/2016    Past Surgical History:  Procedure Laterality Date  . BREAST SURGERY    . CHOLECYSTECTOMY    . COLONOSCOPY N/A 01/20/2014   Procedure: COLONOSCOPY;  Surgeon: Winfield Cunas., MD;  Location: [facility];  Service: Endoscopy;  Laterality: N/A;  . ESOPHAGOGASTRODUODENOSCOPY N/A 01/19/2014   Procedure: ESOPHAGOGASTRODUODENOSCOPY (EGD);  Surgeon: Jeryl Columbia, MD;  Location: [facility];   Service: Endoscopy;  Laterality: N/A;  . MASTECTOMY Left      Current Outpatient Prescriptions  Medication Sig Dispense Refill  . acetaminophen (TYLENOL) 325 MG tablet Take 650 mg by mouth every 6 (six) hours as needed for mild pain. Reported on 04/11/2016    . albuterol (PROVENTIL HFA;VENTOLIN HFA) 108 (90 BASE) MCG/ACT inhaler Inhale 2 puffs into the lungs every 6 (six) hours as needed for wheezing or shortness of breath. Reported on 04/11/2016    . albuterol (PROVENTIL) (2.5 MG/3ML) 0.083% nebulizer solution Take 3 mLs (2.5 mg total) by nebulization every 6 (six) hours as needed for wheezing or shortness of breath.    . benzonatate (TESSALON) 100 MG capsule Take 1 capsule (100 mg total) by mouth 3 (three) times daily as needed for cough. 20 capsule 0  . benzonatate (TESSALON) 100 MG capsule Take 100 mg by mouth daily.    . budesonide (PULMICORT) 0.25 MG/2ML nebulizer solution Take 0.25 mg by nebulization 2 (two) times daily.    . cholecalciferol (VITAMIN D) 1000 UNITS tablet Take 1,000 Units by mouth daily. Reported on 04/11/2016    . Dextromethorphan-Guaifenesin (MUCINEX DM MAXIMUM STRENGTH) 60-1200 MG TB12 Take 1 capsule by mouth 2 (two) times daily. Reported on 04/11/2016    . DULoxetine (CYMBALTA) 60 MG capsule Take 1 capsule (60 mg total) by mouth daily.    . Fluticasone-Salmeterol (ADVAIR) 500-50 MCG/DOSE AEPB Inhale 1 puff into  the lungs 2 (two) times daily.    . furosemide (LASIX) 40 MG tablet Take 40 mg by mouth daily.    Mar[place] Kitchen gabapentin (NEURONTIN) 300 MG capsule Take 300 mg by mouth 3 (three) times daily.     . Hypromellose (ARTIFICIAL TEARS OP) Apply 1 drop to eye 4 (four) times daily as needed (Both Eyes).    Mar[place] Kitchen levothyroxine (SYNTHROID, LEVOTHROID) 75 MCG tablet Take 75 mcg by mouth daily before breakfast.    . lisinopril (PRINIVIL,ZESTRIL) 10 MG tablet Take 1 tablet (10 mg total) by mouth daily. 30 tablet 0  . LORazepam (ATIVAN) 0.5 MG tablet Take one tablet by mouth every 8 hours as  needed for anxiety 90 tablet 1  . magnesium hydroxide (MILK OF MAGNESIA) 400 MG/5ML suspension Take 30 mLs by mouth once. Reported on 04/11/2016    . Melatonin 3 MG TABS Take by mouth at bedtime.    . Multiple Vitamins-Minerals (MULTIVITAMIN WITH MINERALS) tablet Take 1 tablet by mouth daily.    . nitroGLYCERIN (NITROSTAT) 0.4 MG SL tablet Place 0.4 mg under the tongue every 5 (five) minutes as needed for chest pain. 3 DOSES MAX    . NON FORMULARY Inhale into the lungs daily. OXYGEN 2 LITERS 24 HOURS    . oxyCODONE (OXY IR/ROXICODONE) 5 MG immediate release tablet Take one tablet by mouth every 4 hours as needed for pain 120 tablet 0  . OXYGEN Inhale into the lungs. Nasal O2 2L/min for hypoxia    . pantoprazole (PROTONIX) 40 MG tablet Take 1 tablet (40 mg total) by mouth daily at 12 noon. 30 tablet 0  . tiotropium (SPIRIVA HANDIHALER) 18 MCG inhalation capsule Place 1 capsule (18 mcg total) into inhaler and inhale daily. 30 capsule 12   No current facility-administered medications for this visit.     Allergies:   Review of patient's allergies indicates no known allergies.    Social History:  The patient  reports that she quit smoking about 16 years ago. Her smoking use included Cigarettes. She has a 100.00 pack-year smoking history. She has never used smokeless tobacco. She reports that she does not drink alcohol or use drugs.   Family History:  The patient's family history includes Breast cancer in her daughter, sister, and sister; Lung cancer in her sister.    ROS:  Please see the history of present illness.   Otherwise, review of systems are positive for Occasional swelling and chronic leg pain.   All other systems are reviewed and negative.    PHYSICAL EXAM: VS:  BP 110/60   Pulse 67   Ht 5\' 3"  (1.6 m)   Wt 248 lb (112.5 kg)   SpO2 93% Comment: 2 LITERS  BMI 43.93 kg/m  , BMI Body mass index is 43.93 kg/m. GEN: Well nourished, well developed, in no acute distress  HEENT: normal    Neck: no JVD, carotid bruits, or masses Cardiac: RRR; no murmurs, rubs, or gallops, trace pretibial bilateral edema  Respiratory:  clear to auscultation bilaterally, normal work of breathing GI: soft, nontender, nondistended, + BS MS: no deformity or atrophy  Skin: warm and dry, no rash Neuro:  Strength and sensation are intact Psych: euthymic mood, full affect   EKG:   The ekg ordered today demonstrates NSR, wavy baseline, nonspecific ST changes -   Recent Labs: 11/10/2015: TSH 4.26 05/15/2016: ALT 14; Hemoglobin 11.6; Platelets 241 05/26/2016: BUN 26; Creatinine 0.9; Potassium 4.5; Sodium 139   Lipid Panel    Component  Value Date/Time   CHOL 160 12/08/2015   TRIG 181 (A) 12/08/2015   HDL 32 (A) 12/08/2015   LDLCALC 92 12/08/2015     Other studies Reviewed: Additional studies/ records that were reviewed today with results demonstrating: LVH, normal LVEF in 2015.   ASSESSMENT AND PLAN:  1. Chronic diastolic heart failure: She appears euvolemic. Continue furosemide. This can be titrated based on her weight and amount of swelling. I explained to her that daily weights are important as well to judge how much fluid she has in her system.  Continue salt restriction. 2. Hypertension: Continue current medications. Blood pressure well controlled. 3. Lower extremity swelling: Elevate legs as much as possible. Continue diuretics. 4. No ischemic workup at this time given her lack of symptoms.   Current medicines are reviewed at length with the patient today.  The patient concerns regarding her medicines were addressed.  The following changes have been made:  No change  Labs/ tests ordered today include:  No orders of the defined types were placed in this encounter.   Recommend 150 minutes/week of aerobic exercise Low fat, low carb, high fiber diet recommended  Disposition:   FU when necessary   Signed, Larae Grooms, MD  07/19/2016 11:49 AM    [facility]  [facility], [place], [place]  [address] Phone: [phone]; Fax: [phone]

## 2016-07-19 NOTE — Patient Instructions (Signed)
**Note De-identified  Obfuscation** Medication Instructions:  Same-no changes  Labwork: None  Testing/Procedures: None  Follow-Up: As needed     If you need a refill on your cardiac medications before your next appointment, please call your pharmacy.   

## 2016-08-02 ENCOUNTER — Other Ambulatory Visit: Payer: Self-pay

## 2016-08-02 MED ORDER — OXYCODONE HCL 5 MG PO TABS: ORAL_TABLET | ORAL | 0 refills | Status: DC

## 2016-08-02 NOTE — Telephone Encounter (Signed)
Prescription request was received from:    [facility] [address] [place] [address]  Phone: [phone] Fax: [phone] 

## 2016-08-12 ENCOUNTER — Encounter: Payer: Self-pay | Admitting: Nurse Practitioner

## 2016-08-12 ENCOUNTER — Non-Acute Institutional Stay (SKILLED_NURSING_FACILITY): Payer: Medicare Other | Admitting: Nurse Practitioner

## 2016-08-12 DIAGNOSIS — G629 Polyneuropathy, unspecified: Secondary | ICD-10-CM | POA: Diagnosis not present

## 2016-08-12 DIAGNOSIS — Z853 Personal history of malignant neoplasm of breast: Secondary | ICD-10-CM | POA: Diagnosis not present

## 2016-08-12 DIAGNOSIS — R739 Hyperglycemia, unspecified: Secondary | ICD-10-CM | POA: Diagnosis not present

## 2016-08-12 DIAGNOSIS — R079 Chest pain, unspecified: Secondary | ICD-10-CM | POA: Diagnosis not present

## 2016-08-12 DIAGNOSIS — J449 Chronic obstructive pulmonary disease, unspecified: Secondary | ICD-10-CM | POA: Diagnosis not present

## 2016-08-12 DIAGNOSIS — I5032 Chronic diastolic (congestive) heart failure: Secondary | ICD-10-CM | POA: Diagnosis not present

## 2016-08-12 NOTE — Progress Notes (Signed)
Nursing Home Location:  [facility]  Place of Service: SNF (31)  PCP: Unice Cobble, MD  No Known Allergies  Chief Complaint  Patient presents with  . Medical Management of Chronic Issues    Routine Visit    HPI:  Patient is a 82 y.o. female seen today at [facility] for routine follow up on chronic condition. Pt with past medical history of COPD, HTN, hypothyroidism, CHF, and CKD. Pt went to cardiologist last month however did not tell him she was having chest pains therefore was not evaluated. Today pt again mentions that she has chest pains with activity. Had again today when getting out of bed this morning but did not last long. No increase in shortness of breath. Taking lasix as prescribed Ongoing neuropathy in her hands which makes it difficult to self propel in wheelchair.  Pt reported she would get mamogram last month however now does NOT wish to get any additional mammograms. Does not wish to have treatment if something was found. Does not want to be put through mammogram or treatment.  Code status on chart is FULL code however pt wishes to be DNR and "go naturally"   Review of Systems:  Review of Systems  Constitutional: Negative for chills, fatigue and fever.  HENT: Negative for congestion, postnasal drip and rhinorrhea.   Eyes:       Dry eyes  Respiratory: Negative for cough, shortness of breath and wheezing.   Cardiovascular: Positive for chest pain. Negative for palpitations and leg swelling.  Gastrointestinal: Negative for abdominal pain, constipation and diarrhea.  Genitourinary: Positive for frequency. Negative for dysuria, flank pain and hematuria.  Musculoskeletal: Negative for arthralgias, back pain and myalgias.  Neurological: Positive for numbness (to bilateral upper extermities ). Negative for dizziness and light-headedness.    Past Medical History:  Diagnosis Date  . Anxiety state 02/25/2015  . Breast CA ([place])   . CKD (chronic kidney  disease) stage 3, GFR 30-59 ml/min 12/15/2014  . COPD (chronic obstructive pulmonary disease) ([place])   . Diastolic CHF ([place]) 99991111  . GERD without esophagitis 01/19/2016  . Hordeolum externum (stye)    07/01/2016  . HTN (hypertension) 01/18/2014  . Hypertension   . Hypothyroidism 01/18/2014  . Medical history non-contributory   . Pneumonia   . Shortness of breath   . Spondylosis, cervical, with myelopathy 04/11/2016   Past Surgical History:  Procedure Laterality Date  . BREAST SURGERY    . CHOLECYSTECTOMY    . COLONOSCOPY N/A 01/20/2014   Procedure: COLONOSCOPY;  Surgeon: Winfield Cunas., MD;  Location: [facility];  Service: Endoscopy;  Laterality: N/A;  . ESOPHAGOGASTRODUODENOSCOPY N/A 01/19/2014   Procedure: ESOPHAGOGASTRODUODENOSCOPY (EGD);  Surgeon: Jeryl Columbia, MD;  Location: [facility];  Service: Endoscopy;  Laterality: N/A;  . MASTECTOMY Left    Social History:   reports that she quit smoking about 16 years ago. Her smoking use included Cigarettes. She has a 100.00 pack-year smoking history. She has never used smokeless tobacco. She reports that she does not drink alcohol or use drugs.  Family History  Problem Relation Age of Onset  . Lung cancer Sister   . Breast cancer Sister   . Breast cancer Sister   . Breast cancer Daughter   . Heart attack Neg Hx     Medications: Patient's Medications  New Prescriptions   No medications on file  Previous Medications   ACETAMINOPHEN (TYLENOL) 325 MG TABLET    Take 650 mg  by mouth every 6 (six) hours as needed for mild pain. Reported on 04/11/2016   ALBUTEROL (PROVENTIL HFA;VENTOLIN HFA) 108 (90 BASE) MCG/ACT INHALER    Inhale 2 puffs into the lungs every 6 (six) hours as needed for wheezing or shortness of breath. Reported on 04/11/2016   ALBUTEROL (PROVENTIL) (2.5 MG/3ML) 0.083% NEBULIZER SOLUTION    Take 3 mLs (2.5 mg total) by nebulization every 6 (six) hours as needed for wheezing or shortness of breath.   BENZONATATE  (TESSALON) 100 MG CAPSULE    Take 1 capsule (100 mg total) by mouth 3 (three) times daily as needed for cough.   BENZONATATE (TESSALON) 100 MG CAPSULE    Take 100 mg by mouth daily.   BUDESONIDE (PULMICORT) 0.25 MG/2ML NEBULIZER SOLUTION    Take 0.25 mg by nebulization 2 (two) times daily.   CHOLECALCIFEROL (VITAMIN D) 1000 UNITS TABLET    Take 1,000 Units by mouth daily. Reported on 04/11/2016   DEXTROMETHORPHAN-GUAIFENESIN (MUCINEX DM MAXIMUM STRENGTH) 60-1200 MG TB12    Take 1 capsule by mouth 2 (two) times daily. Reported on 04/11/2016   DULOXETINE (CYMBALTA) 60 MG CAPSULE    Take 1 capsule (60 mg total) by mouth daily.   FLUTICASONE-SALMETEROL (ADVAIR) 500-50 MCG/DOSE AEPB    Inhale 1 puff into the lungs 2 (two) times daily.   FUROSEMIDE (LASIX) 40 MG TABLET    Take 40 mg by mouth daily.   GABAPENTIN (NEURONTIN) 300 MG CAPSULE    Take 300 mg by mouth 3 (three) times daily.    HYPROMELLOSE (ARTIFICIAL TEARS OP)    Apply 1 drop to eye 4 (four) times daily as needed (Both Eyes).   LEVOTHYROXINE (SYNTHROID, LEVOTHROID) 75 MCG TABLET    Take 75 mcg by mouth daily before breakfast.   LISINOPRIL (PRINIVIL,ZESTRIL) 10 MG TABLET    Take 1 tablet (10 mg total) by mouth daily.   LORAZEPAM (ATIVAN) 0.5 MG TABLET    Take one tablet by mouth every 8 hours as needed for anxiety   MAGNESIUM HYDROXIDE (MILK OF MAGNESIA) 400 MG/5ML SUSPENSION    Take 30 mLs by mouth once. Reported on 04/11/2016   MELATONIN 3 MG TABS    Take by mouth at bedtime.   MULTIPLE VITAMINS-MINERALS (MULTIVITAMIN WITH MINERALS) TABLET    Take 1 tablet by mouth daily.   NITROGLYCERIN (NITROSTAT) 0.4 MG SL TABLET    Place 0.4 mg under the tongue every 5 (five) minutes as needed for chest pain. 3 DOSES MAX   OXYCODONE (OXY IR/ROXICODONE) 5 MG IMMEDIATE RELEASE TABLET    Take one tablet by mouth every 4 hours as needed for pain   OXYGEN    Inhale into the lungs. Nasal O2 2L/min for hypoxia   PANTOPRAZOLE (PROTONIX) 40 MG TABLET    Take 1  tablet (40 mg total) by mouth daily at 12 noon.   TIOTROPIUM (SPIRIVA HANDIHALER) 18 MCG INHALATION CAPSULE    Place 1 capsule (18 mcg total) into inhaler and inhale daily.  Modified Medications   No medications on file  Discontinued Medications   NON FORMULARY    Inhale into the lungs daily. OXYGEN 2 LITERS 24 HOURS     Physical Exam: Vitals:   08/12/16 1126  BP: 121/77  Pulse: 70  Resp: 20  Temp: 97.4 F (36.3 C)  SpO2: 93%  Weight: 247 lb (112 kg)  Height: 5\' 3"  (1.6 m)    Physical Exam  Constitutional: She is oriented to person, place, and time. She  appears well-developed and well-nourished. No distress.  HENT:  Head: Normocephalic and atraumatic.  Mouth/Throat: Oropharynx is clear and moist. No oropharyngeal exudate.  Eyes: Pupils are equal, round, and reactive to light.  Neck: Normal range of motion. Neck supple.  Cardiovascular: Normal rate, regular rhythm and normal heart sounds.   No murmur heard. Pulmonary/Chest: Effort normal.  Diminished throughout   Abdominal: Soft. Bowel sounds are normal. She exhibits no distension.  Musculoskeletal: She exhibits edema (+1 bilaterally).  Lymphadenopathy:    She has no cervical adenopathy.  Neurological: She is alert and oriented to person, place, and time.  Tremor   Skin: Skin is warm and dry. She is not diaphoretic.  Psychiatric: She has a normal mood and affect.    Labs reviewed: Basic Metabolic Panel:  Recent Labs  05/01/16 05/15/16 05/26/16  NA 141 142 139  K 4.4 4.3 4.5  BUN 20 26* 26*  CREATININE 0.9 0.8 0.9   Liver Function Tests:  Recent Labs  11/10/15 05/15/16  AST 18 15  ALT 16 14  ALKPHOS 61 52   No results for input(s): LIPASE, AMYLASE in the last 8760 hours. No results for input(s): AMMONIA in the last 8760 hours. CBC:  Recent Labs  11/10/15 05/01/16 05/15/16  WBC 8.1 8.2 7.7  HGB 12.0 11.6* 11.6*  HCT 38 35* 36  PLT 262 270 241   TSH:  Recent Labs  08/31/15 11/02/15 11/10/15  TSH  6.21* 3.14 4.26   A1C: Lab Results  Component Value Date   HGBA1C 6.5 (H) 10/26/2014   Lipid Panel:  Recent Labs  12/08/15  CHOL 160  HDL 32*  LDLCALC 92  TRIG 181*   IMPRESSION:  Abnormal MRI cervical spine (without) demonstrating: 1. At C6-7: disc bulging and facet hypertrophy with moderate-severe spinal stenosis, moderate right and severe left foraminal stenosis  2. At C5-6: disc bulging and facet hypertrophy with moderate-severe spinal stenosis and moderate biforaminal stenosis  3. At C4-5: uncovertebral joint hypertrophy with mild biforaminal stenosis  4. At C3-4: uncovertebral joint hypertrophy with mild left foraminal stenosis  5. Patient motion artifact limits this study. Consider repeat scan with sedation if indicated.  Assessment/Plan 1. Diastolic CHF, chronic (HCC) Euvolemic, cont on lasix   2. Chronic obstructive pulmonary disease, unspecified COPD type ([place]) COPD remains stable. conts on chronic O2, advair, mucinex, Pulmicort and tessalon   3. History of breast cancer Does not wish to have any follow up in regards to this. Would not wish to   4. Hyperglycemia Noted on routine labs, will follow up A1c  5. Neuropathy (HCC) -stable, conts on Cymbalta 60 mg  6. Chest pain, unspecified type -will send back to cardiology for further evaluation due to chest pains with exertion.  -will follow up CBC  Will change code status to DNR per pts request   Jessica K. Harle Battiest  [facility] [phone] 8 am - 5 pm) [phone] (after hours)

## 2016-08-16 ENCOUNTER — Non-Acute Institutional Stay (SKILLED_NURSING_FACILITY): Payer: Medicare Other | Admitting: Internal Medicine

## 2016-08-16 ENCOUNTER — Encounter: Payer: Self-pay | Admitting: Internal Medicine

## 2016-08-16 DIAGNOSIS — J441 Chronic obstructive pulmonary disease with (acute) exacerbation: Secondary | ICD-10-CM | POA: Diagnosis not present

## 2016-08-16 DIAGNOSIS — I959 Hypotension, unspecified: Secondary | ICD-10-CM | POA: Insufficient documentation

## 2016-08-16 NOTE — Assessment & Plan Note (Signed)
Exacerbation of COPD with purulent sputum and respiratory compromise Add course of steroids

## 2016-08-16 NOTE — Progress Notes (Signed)
    Facility Location: [facility]  Room Number:207-B  Code Status: Full Code   This is a nursing facility follow up for specific acute issue of cough and shortness of breath. Interim medical record and care since last [place] visit was updated with review of diagnostic studies and change in clinical status since last visit were documented.  HPI: She describes a cough for the last 2-3 days with thick green/brown sputum. This has been associated with increased wheezing. She had associated with intermittent chest discomfort on the left and apparent intermittent hernia at the site of her previous cholecystectomy. She denies upper respiratory tract infection symptoms or significant reflux symptoms at this time. Chest x-ray did not reveal an acute infiltrate, but because of the purulent sputum she was placed on Levaquin. Additionally albuterol was increased to every 4 hours while awake. She quit smoking in 2001 but she had smoked up to 2 packs per day. She has a diagnosis of GOLD Grade D COPD with intermittent severe obstructive phenomena. Also she has a history of GERD and diastolic congestive heart failure. She's been receiving an extensive pulmonary toilet regimen including Pulmicort, Advair, and Spiriva.   Review of systems: Constitutional: No fever,significant weight change  Eyes: No redness, discharge, pain, vision change ENT/mouth: No nasal congestion,  purulent discharge, earache,change in hearing ,sore throat  Cardiovascular: No  palpitations,paroxysmal nocturnal dyspnea, claudication, edema  Respiratory: No hemoptysis Gastrointestinal: No heartburn,dysphagia,abdominal pain, nausea / vomiting,rectal bleeding, melena,change in bowels Genitourinary: No dysuria,hematuria, pyuria Musculoskeletal: No joint stiffness, joint swelling, weakness,pain Dermatologic: No rash, pruritus, change in appearance of skin Allergy/immunology: No itchy/ watery eyes,  significant sneezing, urticaria, angioedema  Physical exam:  Pertinent or positive findings: She exhibits some shortness of breath even at rest. She has complete dentures. Heart sounds are slightly distant but surprisingly slow. There is some respiratory variation to her heart rhythm. She has diffuse rhonchi and history wheezing in all lung fields. This is slightly more prominent on the left.  Abdomen is protuberant. There is no visible hernia at the cholecystectomy operative scar.  Pulses are decreased. She has stasis changes of the shins, greater on the left. There are fusiform changes of the knees. Homans sign is negative.   General appearance:Adequately nourished; no acute distress , increased work of breathing is present.   Lymphatic: No lymphadenopathy about the head, neck, axilla  Eyes: No conjunctival inflammation or lid edema is present. There is no scleral icterus. Ears:  External ear exam shows no significant lesions or deformities.   Nose:  External nasal examination shows no deformity or inflammation. Nasal mucosa are pink and moist without lesions ,exudates Oral exam: lips and gums are healthy appearing.There is no oropharyngeal erythema or exudate . Neck:  No thyromegaly, masses, tenderness noted.    Heart:  No gallop, murmur, click, rub .  Abdomen:Bowel sounds are normal. Abdomen is soft and nontender with no organomegaly, hernias,masses. GU: deferred  Extremities:  No cyanosis, clubbing,edema  Neurologic exam : Strength equal  in upper & lower extremities but decreased  Skin: Warm & dry w/o tenting. No significant lesions or rash.    See summary under each active problem in the Problem List with associated updated therapeutic plan

## 2016-08-16 NOTE — Patient Instructions (Addendum)
Complete present course of Levaquin. Add course of steroids. If she fails to improve, hospitalization will be necessary. Decrease ACE inhibitor to 5 mg daily because of hypotension BMET & CBC

## 2016-08-18 LAB — CBC AND DIFFERENTIAL
HCT: 34 % — AB (ref 36–46)
Hemoglobin: 10.9 g/dL — AB (ref 12.0–16.0)
Neutrophils Absolute: 6480 /uL
PLATELETS: 240 10*3/uL (ref 150–399)
WBC: 9 10*3/mL

## 2016-08-18 LAB — BASIC METABOLIC PANEL
BUN: 122 mg/dL — AB (ref 4–21)
CREATININE: 19 mg/dL — AB (ref 0.5–1.1)
Glucose: 122 mg/dL
Potassium: 4.4 mmol/L (ref 3.4–5.3)
Sodium: 143 mmol/L (ref 137–147)

## 2016-08-18 LAB — HEMOGLOBIN A1C: HEMOGLOBIN A1C: 5.6

## 2016-08-23 ENCOUNTER — Ambulatory Visit: Payer: Medicare Other | Admitting: Physician Assistant

## 2016-08-24 ENCOUNTER — Ambulatory Visit: Payer: Medicare Other | Admitting: Physician Assistant

## 2016-08-26 ENCOUNTER — Encounter: Payer: Self-pay | Admitting: Nurse Practitioner

## 2016-08-26 ENCOUNTER — Non-Acute Institutional Stay (SKILLED_NURSING_FACILITY): Payer: Medicare Other | Admitting: Nurse Practitioner

## 2016-08-26 DIAGNOSIS — R079 Chest pain, unspecified: Secondary | ICD-10-CM | POA: Diagnosis not present

## 2016-08-26 DIAGNOSIS — I959 Hypotension, unspecified: Secondary | ICD-10-CM | POA: Diagnosis not present

## 2016-08-26 DIAGNOSIS — J441 Chronic obstructive pulmonary disease with (acute) exacerbation: Secondary | ICD-10-CM | POA: Diagnosis not present

## 2016-08-26 DIAGNOSIS — Z66 Do not resuscitate: Secondary | ICD-10-CM

## 2016-08-26 NOTE — Progress Notes (Signed)
Nursing Home Location:  [facility]  Place of Service: SNF (31)  PCP: Unice Cobble, MD  No Known Allergies  Chief Complaint  Patient presents with  . Other    Follow up on chest pain    HPI:  Patient is a 82 y.o. female seen today at [facility] for  follow up on chest pains. Pt with past medical history of COPD, HTN, hypothyroidism, CHF, and CKD. Pt went to cardiologist last month however did not tell him she was having chest pains therefore was not evaluated. Order was given for follow up cardiology appt since pt was having recurrent chest pain but she decided she did not want to go. Reports she only had it one day and they went away.  Pt confirms DNR.  Pt was seen for COPD exacerbation and was treated with prednisone and Levaquin. Pt reports she is feeling much better. Cough has improved. Now sputum is clear.  Shortness of breath much better.  Lisinopril decreased to 5 mg daily because of hypotension. Blood pressure remains stable.  6CIT Screen 08/26/2016  What Year? 0 points  What month? 0 points  What time? 0 points  Count back from 20 0 points  Months in reverse 0 points  Repeat phrase 4 points  Total Score 4    Review of Systems:  Review of Systems  Constitutional: Negative for chills, fatigue and fever.  HENT: Negative for congestion, postnasal drip and rhinorrhea.   Eyes:       Dry eyes  Respiratory: Negative for cough, shortness of breath and wheezing.   Cardiovascular: Negative for chest pain, palpitations and leg swelling.  Gastrointestinal: Negative for abdominal pain, constipation and diarrhea.  Genitourinary: Positive for frequency (due to diuretic ). Negative for dysuria, flank pain and hematuria.  Musculoskeletal: Negative for arthralgias, back pain and myalgias.  Skin: Negative for wound.  Neurological: Positive for numbness (to bilateral upper extermities ). Negative for dizziness and light-headedness.  Psychiatric/Behavioral: Negative  for confusion.    Past Medical History:  Diagnosis Date  . Anxiety state 02/25/2015  . Breast CA ([place])   . CKD (chronic kidney disease) stage 3, GFR 30-59 ml/min 12/15/2014  . COPD (chronic obstructive pulmonary disease) ([place])   . Diastolic CHF ([place]) 99991111  . GERD without esophagitis 01/19/2016  . Hordeolum externum (stye)    07/01/2016  . HTN (hypertension) 01/18/2014  . Hypertension   . Hypothyroidism 01/18/2014  . Pneumonia   . Spondylosis, cervical, with myelopathy 04/11/2016   Past Surgical History:  Procedure Laterality Date  . BREAST SURGERY    . CHOLECYSTECTOMY    . COLONOSCOPY N/A 01/20/2014   Procedure: COLONOSCOPY;  Surgeon: Winfield Cunas., MD;  Location: [facility];  Service: Endoscopy;  Laterality: N/A;  . ESOPHAGOGASTRODUODENOSCOPY N/A 01/19/2014   Procedure: ESOPHAGOGASTRODUODENOSCOPY (EGD);  Surgeon: Jeryl Columbia, MD;  Location: [facility];  Service: Endoscopy;  Laterality: N/A;  . MASTECTOMY Left    Social History:   reports that she quit smoking about 16 years ago. Her smoking use included Cigarettes. She has a 100.00 pack-year smoking history. She has never used smokeless tobacco. She reports that she does not drink alcohol or use drugs.  Family History  Problem Relation Age of Onset  . Lung cancer Sister   . Breast cancer Sister   . Breast cancer Sister   . Breast cancer Daughter   . Heart attack Neg Hx     Medications: Patient's Medications  New Prescriptions  No medications on file  Previous Medications   ACETAMINOPHEN (TYLENOL) 325 MG TABLET    Take 650 mg by mouth every 6 (six) hours as needed for mild pain. Reported on 04/11/2016   ALBUTEROL (PROVENTIL HFA;VENTOLIN HFA) 108 (90 BASE) MCG/ACT INHALER    Inhale 2 puffs into the lungs every 6 (six) hours as needed for wheezing or shortness of breath. Reported on 04/11/2016   ALBUTEROL (PROVENTIL) (2.5 MG/3ML) 0.083% NEBULIZER SOLUTION    Take 2.5 mg by nebulization every 6 (six) hours as needed  for wheezing or shortness of breath.    BUDESONIDE (PULMICORT) 0.25 MG/2ML NEBULIZER SOLUTION    Take 0.25 mg by nebulization 2 (two) times daily.   CHOLECALCIFEROL (VITAMIN D) 1000 UNITS TABLET    Take 1,000 Units by mouth daily. Reported on 04/11/2016   DEXTROMETHORPHAN-GUAIFENESIN (MUCINEX DM MAXIMUM STRENGTH) 60-1200 MG TB12    Take 1 capsule by mouth 2 (two) times daily. Reported on 04/11/2016   DULOXETINE (CYMBALTA) 60 MG CAPSULE    Take 1 capsule (60 mg total) by mouth daily.   FLUTICASONE-SALMETEROL (ADVAIR) 500-50 MCG/DOSE AEPB    Inhale 1 puff into the lungs 2 (two) times daily.   FUROSEMIDE (LASIX) 40 MG TABLET    Take 40 mg by mouth daily.   GABAPENTIN (NEURONTIN) 300 MG CAPSULE    Take 300 mg by mouth 3 (three) times daily.    HYPROMELLOSE (ARTIFICIAL TEARS OP)    Apply 1 drop to eye 4 (four) times daily as needed (Both Eyes).   LEVOTHYROXINE (SYNTHROID, LEVOTHROID) 75 MCG TABLET    Take 75 mcg by mouth daily before breakfast.   LISINOPRIL (PRINIVIL,ZESTRIL) 5 MG TABLET    Take 5 mg by mouth daily.   LORAZEPAM (ATIVAN) 0.5 MG TABLET    Take one tablet by mouth every 8 hours as needed for anxiety   MAGNESIUM HYDROXIDE (MILK OF MAGNESIA) 400 MG/5ML SUSPENSION    Take 30 mLs by mouth once. Reported on 04/11/2016   MELATONIN 3 MG TABS    Take by mouth at bedtime.   MULTIPLE VITAMINS-MINERALS (MULTIVITAMIN WITH MINERALS) TABLET    Take 1 tablet by mouth daily.   NITROGLYCERIN (NITROSTAT) 0.4 MG SL TABLET    Place 0.4 mg under the tongue every 5 (five) minutes as needed for chest pain. 3 DOSES MAX   OXYCODONE (OXY IR/ROXICODONE) 5 MG IMMEDIATE RELEASE TABLET    Take one tablet by mouth every 4 hours as needed for pain   OXYGEN    Inhale into the lungs. Nasal O2 2L/min for hypoxia   PANTOPRAZOLE (PROTONIX) 40 MG TABLET    Take 1 tablet (40 mg total) by mouth daily at 12 noon.   TIOTROPIUM (SPIRIVA HANDIHALER) 18 MCG INHALATION CAPSULE    Place 1 capsule (18 mcg total) into inhaler and inhale  daily.  Modified Medications   No medications on file  Discontinued Medications   LISINOPRIL (PRINIVIL,ZESTRIL) 10 MG TABLET    Take 1 tablet (10 mg total) by mouth daily.     Physical Exam: Vitals:   08/26/16 1413  BP: (!) 117/52  Pulse: 65  Resp: 20  Temp: 98.9 F (37.2 C)  SpO2: 98%  Weight: 247 lb (112 kg)  Height: 5\' 3"  (1.6 m)    Physical Exam  Constitutional: She is oriented to person, place, and time. She appears well-developed and well-nourished. No distress.  HENT:  Head: Normocephalic and atraumatic.  Mouth/Throat: Oropharynx is clear and moist. No oropharyngeal exudate.  Eyes: Pupils are equal, round, and reactive to light.  Neck: Normal range of motion. Neck supple.  Cardiovascular: Normal rate, regular rhythm and normal heart sounds.   No murmur heard. Pulmonary/Chest: Effort normal. She has wheezes (right lower lobe).  Diminished throughout   Abdominal: Soft. Bowel sounds are normal. She exhibits no distension.  Musculoskeletal: She exhibits edema (+1 bilaterally).  Lymphadenopathy:    She has no cervical adenopathy.  Neurological: She is alert and oriented to person, place, and time.  Tremor   Skin: Skin is warm and dry. She is not diaphoretic.  Psychiatric: She has a normal mood and affect.    Labs reviewed: Basic Metabolic Panel:  Recent Labs  05/01/16 05/15/16 05/26/16  NA 141 142 139  K 4.4 4.3 4.5  BUN 20 26* 26*  CREATININE 0.9 0.8 0.9   Liver Function Tests:  Recent Labs  11/10/15 05/15/16  AST 18 15  ALT 16 14  ALKPHOS 61 52   No results for input(s): LIPASE, AMYLASE in the last 8760 hours. No results for input(s): AMMONIA in the last 8760 hours. CBC:  Recent Labs  11/10/15 05/01/16 05/15/16  WBC 8.1 8.2 7.7  HGB 12.0 11.6* 11.6*  HCT 38 35* 36  PLT 262 270 241   TSH:  Recent Labs  08/31/15 11/02/15 11/10/15  TSH 6.21* 3.14 4.26   A1C: Lab Results  Component Value Date   HGBA1C 6.5 (H) 10/26/2014   Lipid  Panel:  Recent Labs  12/08/15  CHOL 160  HDL 32*  LDLCALC 92  TRIG 181*   IMPRESSION:  Abnormal MRI cervical spine (without) demonstrating: 1. At C6-7: disc bulging and facet hypertrophy with moderate-severe spinal stenosis, moderate right and severe left foraminal stenosis  2. At C5-6: disc bulging and facet hypertrophy with moderate-severe spinal stenosis and moderate biforaminal stenosis  3. At C4-5: uncovertebral joint hypertrophy with mild biforaminal stenosis  4. At C3-4: uncovertebral joint hypertrophy with mild left foraminal stenosis  5. Patient motion artifact limits this study. Consider repeat scan with sedation if indicated.  Assessment/Plan 1. Chronic obstructive pulmonary disease with acute exacerbation (HCC) Improved. Has completed Levaquin and prednisone taper. Continues on albuterol nebulizer q 6 hours PRN  2. Hypotension, unspecified hypotension type Stable, conts on lisinopril 5 mg daily   3. Chest pain, unspecified type No recurrent episodes, had appt to be seen but reports she was feeling bad (recent COPD exacerbation ) and did not wish to go to appt. No other chest pains since.  Would not like to follow up at this time. Understands risk of this. Given education on chest pain and MI, pt reports she understands and if she has recurrent episode she will notify staff.   4. DNR (do not resuscitate) -pt wishes for a natural death. - DNR (Do Not Resuscitate)  Carlos American. Harle Battiest  [facility] [phone] 8 am - 5 pm) [phone] (after hours)

## 2016-09-06 ENCOUNTER — Non-Acute Institutional Stay (SKILLED_NURSING_FACILITY): Payer: Medicare Other | Admitting: Internal Medicine

## 2016-09-06 ENCOUNTER — Encounter: Payer: Self-pay | Admitting: Internal Medicine

## 2016-09-06 DIAGNOSIS — J449 Chronic obstructive pulmonary disease, unspecified: Secondary | ICD-10-CM | POA: Diagnosis not present

## 2016-09-06 DIAGNOSIS — J441 Chronic obstructive pulmonary disease with (acute) exacerbation: Secondary | ICD-10-CM

## 2016-09-06 NOTE — Patient Instructions (Signed)
Pulmonary toilet altered as well as there was some duplication. Empiric trial of Z-Pak Pulmonary consultation with Dr. Lake Bells to optimize pulmonary toilet

## 2016-09-06 NOTE — Progress Notes (Signed)
   Facility Location: [facility]  Room Number: 227-B  Code Status: Full Code   This is a nursing facility follow up for specific acute issue of cough & congestion.  Interim medical record and care since last [place] visit was updated with review of diagnostic studies and change in clinical status since last visit were documented.  HPI: She states that her sputum became white for  48 hours but has returned to yellow,she is [producing one teaspoon  4 times a day. She states her cough is worse all night and is disturbing sleep. She describes dyspnea and wheezing at night. She describes sweats w/o chills and fever. She's had intermittent pain in her eyes. Her sore throat was related to taking multiple pills at one time ;she believes that she "scratched" her throat.  Review of systems:   She describes recurrent constipation. Last night stools were loose-watery  onstitutional: No fever,significant weight change, fatigue  Eyes: No redness, discharge, vision change ENT/mouth: No nasal congestion,  purulent discharge, earache,change in hearing ,sore throat  Cardiovascular: No chest pain, palpitations,claudication, edema  Respiratory: No hemoptysis Gastrointestinal: No heartburn,dysphagia,abdominal pain, nausea / vomiting,rectal bleeding, melena Genitourinary: No dysuria,hematuria, pyuria,  incontinence, nocturia Dermatologic: No rash, pruritus, change in appearance of skin Hematologic/lymphatic: No significant bruising, lymphadenopathy,abnormal bleeding Allergy/immunology: No itchy/ watery eyes, significant sneezing, urticaria, angioedema  Physical exam:  Pertinent or positive findings: she does wear reveal tachypnea at rest despite nasal oxygen. Arcus senilis is present. She has full extraocular motion without pain as well as full field of vision. There is no joint time will inflammation or purulence. She has complete dentures. She is slightly hoarse.  Oropharynx reveals no exudate or erythema. An S4 is noted. Lungs are silent. She has bland hyperpigmentation over the lower shins. Sign is negative bilaterally. Pedal pulses are decreased. There is clubbing of the nailbeds.   General appearance:Adequately nourished; no acute distress , increased work of breathing is present.   Lymphatic: No lymphadenopathy about the head, neck, axilla . Eyes: No conjunctival inflammation or lid edema is present. There is no scleral icterus. Ears:  External ear exam shows no significant lesions or deformities.   Nose:  External nasal examination shows no deformity or inflammation. Nasal mucosa are pink and moist without lesions ,exudates Oral exam: lips and gums are healthy appearing.There is no oropharyngeal erythema or exudate . Neck:  No thyromegaly, masses, tenderness noted.    Heart:  Normal rate and regular rhythm. S1 and S2 normal without gallop, murmur, click, rub .  Lungs: without wheezes, rhonchi,rales , rubs. Abdomen:Bowel sounds are normal. Abdomen is soft and nontender with no organomegaly, hernias,masses. GU: deferred  Extremities:  No cyanosis, edema  Skin: Warm & dry w/o tenting. No significant lesions or rash.   #1 COPD exacerbation See plan

## 2016-09-18 ENCOUNTER — Inpatient Hospital Stay (HOSPITAL_COMMUNITY)
Admission: EM | Admit: 2016-09-18 | Discharge: 2016-09-20 | DRG: 291 | Disposition: A | Discharge status: Skilled Nursing Facility | Payer: Medicare Other | Attending: Family Medicine | Admitting: Family Medicine

## 2016-09-18 ENCOUNTER — Emergency Department (HOSPITAL_COMMUNITY): Payer: Medicare Other

## 2016-09-18 ENCOUNTER — Encounter (HOSPITAL_COMMUNITY): Payer: Self-pay | Admitting: Emergency Medicine

## 2016-09-18 DIAGNOSIS — R0902 Hypoxemia: Secondary | ICD-10-CM | POA: Diagnosis not present

## 2016-09-18 DIAGNOSIS — I130 Hypertensive heart and chronic kidney disease with heart failure and stage 1 through stage 4 chronic kidney disease, or unspecified chronic kidney disease: Secondary | ICD-10-CM | POA: Diagnosis not present

## 2016-09-18 DIAGNOSIS — Z9012 Acquired absence of left breast and nipple: Secondary | ICD-10-CM

## 2016-09-18 DIAGNOSIS — J449 Chronic obstructive pulmonary disease, unspecified: Secondary | ICD-10-CM | POA: Diagnosis not present

## 2016-09-18 DIAGNOSIS — Z853 Personal history of malignant neoplasm of breast: Secondary | ICD-10-CM

## 2016-09-18 DIAGNOSIS — Z7951 Long term (current) use of inhaled steroids: Secondary | ICD-10-CM

## 2016-09-18 DIAGNOSIS — J811 Chronic pulmonary edema: Secondary | ICD-10-CM | POA: Diagnosis present

## 2016-09-18 DIAGNOSIS — Z9889 Other specified postprocedural states: Secondary | ICD-10-CM

## 2016-09-18 DIAGNOSIS — Z6841 Body Mass Index (BMI) 40.0 and over, adult: Secondary | ICD-10-CM

## 2016-09-18 DIAGNOSIS — J9621 Acute and chronic respiratory failure with hypoxia: Secondary | ICD-10-CM | POA: Diagnosis present

## 2016-09-18 DIAGNOSIS — I959 Hypotension, unspecified: Secondary | ICD-10-CM | POA: Diagnosis present

## 2016-09-18 DIAGNOSIS — N183 Chronic kidney disease, stage 3 (moderate): Secondary | ICD-10-CM | POA: Diagnosis present

## 2016-09-18 DIAGNOSIS — I5033 Acute on chronic diastolic (congestive) heart failure: Secondary | ICD-10-CM | POA: Diagnosis present

## 2016-09-18 DIAGNOSIS — Z87891 Personal history of nicotine dependence: Secondary | ICD-10-CM

## 2016-09-18 DIAGNOSIS — Z66 Do not resuscitate: Secondary | ICD-10-CM | POA: Diagnosis present

## 2016-09-18 DIAGNOSIS — E034 Atrophy of thyroid (acquired): Secondary | ICD-10-CM | POA: Diagnosis not present

## 2016-09-18 DIAGNOSIS — Z801 Family history of malignant neoplasm of trachea, bronchus and lung: Secondary | ICD-10-CM

## 2016-09-18 DIAGNOSIS — Z79899 Other long term (current) drug therapy: Secondary | ICD-10-CM

## 2016-09-18 DIAGNOSIS — I509 Heart failure, unspecified: Secondary | ICD-10-CM

## 2016-09-18 DIAGNOSIS — E1122 Type 2 diabetes mellitus with diabetic chronic kidney disease: Secondary | ICD-10-CM | POA: Diagnosis present

## 2016-09-18 DIAGNOSIS — J441 Chronic obstructive pulmonary disease with (acute) exacerbation: Secondary | ICD-10-CM | POA: Diagnosis present

## 2016-09-18 DIAGNOSIS — Z9981 Dependence on supplemental oxygen: Secondary | ICD-10-CM

## 2016-09-18 DIAGNOSIS — K219 Gastro-esophageal reflux disease without esophagitis: Secondary | ICD-10-CM | POA: Diagnosis present

## 2016-09-18 DIAGNOSIS — J810 Acute pulmonary edema: Secondary | ICD-10-CM | POA: Diagnosis not present

## 2016-09-18 DIAGNOSIS — D649 Anemia, unspecified: Secondary | ICD-10-CM | POA: Diagnosis present

## 2016-09-18 DIAGNOSIS — E039 Hypothyroidism, unspecified: Secondary | ICD-10-CM | POA: Diagnosis present

## 2016-09-18 DIAGNOSIS — Z803 Family history of malignant neoplasm of breast: Secondary | ICD-10-CM

## 2016-09-18 DIAGNOSIS — J069 Acute upper respiratory infection, unspecified: Secondary | ICD-10-CM

## 2016-09-18 DIAGNOSIS — E669 Obesity, unspecified: Secondary | ICD-10-CM | POA: Diagnosis present

## 2016-09-18 DIAGNOSIS — R0989 Other specified symptoms and signs involving the circulatory and respiratory systems: Secondary | ICD-10-CM | POA: Diagnosis present

## 2016-09-18 DIAGNOSIS — Z9049 Acquired absence of other specified parts of digestive tract: Secondary | ICD-10-CM

## 2016-09-18 LAB — CBC
HCT: 37.3 % (ref 36.0–46.0)
HCT: 36.5 % (ref 36.0–46.0)
Hemoglobin: 11.5 g/dL — ABNORMAL LOW (ref 12.0–15.0)
Hemoglobin: 11.3 g/dL — ABNORMAL LOW (ref 12.0–15.0)
MCH: 30.3 pg (ref 26.0–34.0)
MCH: 30.5 pg (ref 26.0–34.0)
MCHC: 30.8 g/dL (ref 30.0–36.0)
MCHC: 31 g/dL (ref 30.0–36.0)
MCV: 98.4 fL (ref 78.0–100.0)
MCV: 98.4 fL (ref 78.0–100.0)
PLATELETS: 261 10*3/uL (ref 150–400)
PLATELETS: 258 10*3/uL (ref 150–400)
RBC: 3.79 MIL/uL — ABNORMAL LOW (ref 3.87–5.11)
RBC: 3.71 MIL/uL — AB (ref 3.87–5.11)
RDW: 14.2 % (ref 11.5–15.5)
RDW: 14.5 % (ref 11.5–15.5)
WBC: 10 10*3/uL (ref 4.0–10.5)
WBC: 8.7 10*3/uL (ref 4.0–10.5)

## 2016-09-18 LAB — URINALYSIS, ROUTINE W REFLEX MICROSCOPIC
Bilirubin Urine: NEGATIVE
GLUCOSE, UA: NEGATIVE mg/dL
Hgb urine dipstick: NEGATIVE
KETONES UR: NEGATIVE mg/dL
LEUKOCYTES UA: NEGATIVE
NITRITE: NEGATIVE
PH: 7 (ref 5.0–8.0)
PROTEIN: NEGATIVE mg/dL
Specific Gravity, Urine: 1.015 (ref 1.005–1.030)

## 2016-09-18 LAB — BASIC METABOLIC PANEL
Anion gap: 9 (ref 5–15)
BUN: 14 mg/dL (ref 6–20)
CALCIUM: 8.8 mg/dL — AB (ref 8.9–10.3)
CO2: 32 mmol/L (ref 22–32)
CREATININE: 0.81 mg/dL (ref 0.44–1.00)
Chloride: 100 mmol/L — ABNORMAL LOW (ref 101–111)
GFR calc Af Amer: >60 mL/min (ref >60)
GLUCOSE: 130 mg/dL — AB (ref 65–99)
Potassium: 4 mmol/L (ref 3.5–5.1)
Sodium: 141 mmol/L (ref 135–145)

## 2016-09-18 LAB — I-STAT TROPONIN, ED: TROPONIN I, POC: 0.01 ng/mL (ref 0.00–0.08)

## 2016-09-18 LAB — CREATININE, SERUM
CREATININE: 0.83 mg/dL (ref 0.44–1.00)
GFR calc non Af Amer: >60 mL/min (ref >60)

## 2016-09-18 LAB — MRSA PCR SCREENING: MRSA BY PCR: NEGATIVE

## 2016-09-18 LAB — BRAIN NATRIURETIC PEPTIDE: B Natriuretic Peptide: 77.4 pg/mL (ref 0.0–100.0)

## 2016-09-18 MED ORDER — VITAMIN D 1000 UNITS PO TABS
1000.0000 [IU] | ORAL_TABLET | Freq: Every day | ORAL | Status: DC
  Administered 2016-09-18 – 2016-09-20 (×3): 1000 [IU] via ORAL
  Filled 2016-09-18 (×3): qty 1

## 2016-09-18 MED ORDER — ACETAMINOPHEN 325 MG PO TABS
650.0000 mg | ORAL_TABLET | Freq: Four times a day (QID) | ORAL | Status: DC | PRN
  Administered 2016-09-18: 650 mg via ORAL
  Filled 2016-09-18: qty 2

## 2016-09-18 MED ORDER — LORAZEPAM 0.5 MG PO TABS
0.5000 mg | ORAL_TABLET | Freq: Three times a day (TID) | ORAL | Status: DC | PRN
  Administered 2016-09-18 – 2016-09-19 (×2): 0.5 mg via ORAL
  Filled 2016-09-18 (×2): qty 1

## 2016-09-18 MED ORDER — ONDANSETRON HCL 4 MG PO TABS: 4.0000 mg | ORAL_TABLET | Freq: Four times a day (QID) | ORAL | Status: DC | PRN

## 2016-09-18 MED ORDER — ALBUTEROL SULFATE (2.5 MG/3ML) 0.083% IN NEBU: 5.0000 mg | INHALATION_SOLUTION | Freq: Once | RESPIRATORY_TRACT | Status: DC

## 2016-09-18 MED ORDER — AZITHROMYCIN 500 MG PO TABS
500.0000 mg | ORAL_TABLET | Freq: Every day | ORAL | Status: AC
  Administered 2016-09-18: 500 mg via ORAL
  Filled 2016-09-18: qty 1

## 2016-09-18 MED ORDER — PANTOPRAZOLE SODIUM 40 MG PO TBEC
40.0000 mg | DELAYED_RELEASE_TABLET | Freq: Every day | ORAL | Status: DC
  Administered 2016-09-18 – 2016-09-20 (×3): 40 mg via ORAL
  Filled 2016-09-18 (×3): qty 1

## 2016-09-18 MED ORDER — ONDANSETRON HCL 4 MG/2ML IJ SOLN: 4.0000 mg | Freq: Four times a day (QID) | INTRAMUSCULAR | Status: DC | PRN

## 2016-09-18 MED ORDER — MOMETASONE FURO-FORMOTEROL FUM 200-5 MCG/ACT IN AERO
2.0000 | INHALATION_SPRAY | Freq: Two times a day (BID) | RESPIRATORY_TRACT | Status: DC
  Administered 2016-09-18 – 2016-09-20 (×5): 2 via RESPIRATORY_TRACT
  Filled 2016-09-18: qty 8.8

## 2016-09-18 MED ORDER — DULOXETINE HCL 60 MG PO CPEP
60.0000 mg | ORAL_CAPSULE | Freq: Every day | ORAL | Status: DC
  Administered 2016-09-18 – 2016-09-20 (×3): 60 mg via ORAL
  Filled 2016-09-18 (×3): qty 1

## 2016-09-18 MED ORDER — SODIUM CHLORIDE 0.9 % IV SOLN: 250.0000 mL | INTRAVENOUS | Status: DC | PRN

## 2016-09-18 MED ORDER — LEVOTHYROXINE SODIUM 75 MCG PO TABS
75.0000 ug | ORAL_TABLET | Freq: Every day | ORAL | Status: DC
  Administered 2016-09-18 – 2016-09-20 (×3): 75 ug via ORAL
  Filled 2016-09-18 (×3): qty 1

## 2016-09-18 MED ORDER — SODIUM CHLORIDE 0.9% FLUSH
3.0000 mL | Freq: Two times a day (BID) | INTRAVENOUS | Status: DC
  Administered 2016-09-18 – 2016-09-20 (×5): 3 mL via INTRAVENOUS

## 2016-09-18 MED ORDER — PREDNISONE 20 MG PO TABS
40.0000 mg | ORAL_TABLET | Freq: Every day | ORAL | Status: DC
  Administered 2016-09-18 – 2016-09-20 (×3): 40 mg via ORAL
  Filled 2016-09-18 (×3): qty 2

## 2016-09-18 MED ORDER — ALBUTEROL SULFATE (2.5 MG/3ML) 0.083% IN NEBU: 2.5000 mg | INHALATION_SOLUTION | RESPIRATORY_TRACT | Status: DC | PRN

## 2016-09-18 MED ORDER — IPRATROPIUM-ALBUTEROL 0.5-2.5 (3) MG/3ML IN SOLN
3.0000 mL | Freq: Four times a day (QID) | RESPIRATORY_TRACT | Status: DC
  Administered 2016-09-18 (×3): 3 mL via RESPIRATORY_TRACT
  Filled 2016-09-18 (×3): qty 3

## 2016-09-18 MED ORDER — SODIUM CHLORIDE 0.9% FLUSH: 3.0000 mL | INTRAVENOUS | Status: DC | PRN

## 2016-09-18 MED ORDER — FUROSEMIDE 10 MG/ML IJ SOLN
40.0000 mg | Freq: Two times a day (BID) | INTRAMUSCULAR | Status: AC
  Administered 2016-09-18 – 2016-09-20 (×4): 40 mg via INTRAVENOUS
  Filled 2016-09-18 (×4): qty 4

## 2016-09-18 MED ORDER — FUROSEMIDE 10 MG/ML IJ SOLN
20.0000 mg | Freq: Once | INTRAMUSCULAR | Status: AC
  Administered 2016-09-18: 20 mg via INTRAVENOUS
  Filled 2016-09-18: qty 2

## 2016-09-18 MED ORDER — IPRATROPIUM BROMIDE 0.02 % IN SOLN: 0.5000 mg | Freq: Once | RESPIRATORY_TRACT | Status: DC

## 2016-09-18 MED ORDER — GUAIFENESIN ER 600 MG PO TB12
1200.0000 mg | ORAL_TABLET | Freq: Two times a day (BID) | ORAL | Status: DC
  Administered 2016-09-18 – 2016-09-20 (×5): 1200 mg via ORAL
  Filled 2016-09-18 (×5): qty 2

## 2016-09-18 MED ORDER — GUAIFENESIN-DM 100-10 MG/5ML PO SYRP
5.0000 mL | ORAL_SOLUTION | ORAL | Status: DC | PRN
  Administered 2016-09-18 – 2016-09-19 (×2): 5 mL via ORAL
  Filled 2016-09-18 (×2): qty 5

## 2016-09-18 MED ORDER — IPRATROPIUM-ALBUTEROL 0.5-2.5 (3) MG/3ML IN SOLN
3.0000 mL | Freq: Three times a day (TID) | RESPIRATORY_TRACT | Status: DC
  Administered 2016-09-18 – 2016-09-20 (×6): 3 mL via RESPIRATORY_TRACT
  Filled 2016-09-18 (×5): qty 3

## 2016-09-18 MED ORDER — GABAPENTIN 300 MG PO CAPS
300.0000 mg | ORAL_CAPSULE | Freq: Three times a day (TID) | ORAL | Status: DC
  Administered 2016-09-18 – 2016-09-20 (×8): 300 mg via ORAL
  Filled 2016-09-18 (×8): qty 1

## 2016-09-18 MED ORDER — FUROSEMIDE 10 MG/ML IJ SOLN: 20.0000 mg | Freq: Two times a day (BID) | INTRAMUSCULAR | Status: DC

## 2016-09-18 MED ORDER — ENOXAPARIN SODIUM 40 MG/0.4ML ~~LOC~~ SOLN
40.0000 mg | Freq: Every day | SUBCUTANEOUS | Status: DC
  Administered 2016-09-18 – 2016-09-20 (×3): 40 mg via SUBCUTANEOUS
  Filled 2016-09-18 (×3): qty 0.4

## 2016-09-18 MED ORDER — AZITHROMYCIN 500 MG PO TABS
250.0000 mg | ORAL_TABLET | Freq: Every day | ORAL | Status: DC
  Administered 2016-09-19 – 2016-09-20 (×2): 250 mg via ORAL
  Filled 2016-09-18 (×2): qty 1

## 2016-09-18 MED ORDER — OXYCODONE HCL 5 MG PO TABS: 5.0000 mg | ORAL_TABLET | Freq: Four times a day (QID) | ORAL | Status: DC | PRN

## 2016-09-18 MED ORDER — ADULT MULTIVITAMIN W/MINERALS CH
1.0000 | ORAL_TABLET | Freq: Every day | ORAL | Status: DC
  Administered 2016-09-18 – 2016-09-20 (×3): 1 via ORAL
  Filled 2016-09-18 (×3): qty 1

## 2016-09-18 NOTE — Progress Notes (Signed)
Pt arrived on the unit via bed from the emergency department. Patient is alert and oriented x 4. No distress noted.  On 2 liters of oxygen via nasal cannula.  Bed rails up x 2.  Skin is intact with MASD on the sacrum, outer left foot shows evidence of an old callus, mastectomy to the left breast. Educated the patient on safety, proper use of the phone and call bell.  Both within reach.  Will continue to monitor the patient and notify as needed

## 2016-09-18 NOTE — Progress Notes (Signed)
PROGRESS NOTE                                                                                                                                                                                                             Patient Demographics:    Dawn Montoya, is a 82 y.o. female, DOB - 09/29/1932, [id number]  Admit date - 09/18/2016   Admitting Physician Oswald Hillock, MD  Outpatient Primary MD for the patient is Unice Cobble, MD  LOS - 1  Outpatient Specialists:  none  Chief Complaint  Patient presents with  . Shortness of Breath       Brief Narrative   82 year old obese female resident of [facility] with history of COPD on home O2 (3L via nasal cannula), diastolic CHF, hypertension, see daily stage III and hypothyroidism who presented with cough with yellowish phlegm and increased shortness of breath. Patient recently had COPD exacerbation and was treated with prednisone and Levaquin about 2 weeks back. She denies any fever, chills, nausea, vomiting, chest pain, dysuria or diarrhea. In the ED vitals were stable. Blood work showed mild anemia, normal BNP with chest x-ray showing pulmonary vascular congestion and interstitial edema. Admitted for COPD exacerbation and acute on chronic diastolic CHF.   Subjective:   Reports breathing to be slightly better with nebulizer.   Assessment  & Plan :    Active Problems:   Acute exacerbation of COPD (chronic obstructive pulmonary disease) (HCC) Continue O2 via nasal cannula (3 L), scheduled DuoNeb's when necessary albuterol neb. I will add oral prednisone 40 mg daily. Add Z-Pak. Continue Dulera bid. Supportive care with Tylenol and antitussives.  Acute on chronic diastolic CHF Oral Lasix at home. Will place her on IV Lasix 40 mg twice a day. Monitor strict I specimen daily. Check 2-D echo.     Hypothyroidism Continue Synthroid.    Essential  hypertension Holding home medications due to low diastolic BP.  Chronic kidney disease stage III Renal function stable.   Code Status : DO NOT RESUSCITATE  Family Communication  : None at bedside  Disposition Plan  : Return to skilled nursing facility once improved, possibly in the next 48 hours  Barriers For Discharge : Active symptoms  Consults  :  None  Procedures  : 2-D echo (pending)  DVT Prophylaxis  :  Lovenox -  Lab Results  Component Value Date   PLT 258 09/18/2016    Antibiotics  :    Anti-infectives    None        Objective:   Vitals:   09/18/16 0609 09/18/16 0611 09/18/16 0855 09/18/16 0922  BP: (!) 132/40 (!) 133/43 (!) 121/39   Pulse: 65 60 70   Resp: 18     Temp: 98.7 F (37.1 C)  98.6 F (37 C)   TempSrc: Oral  Oral   SpO2: 96%  91% 90%  Weight: 113.9 kg (251 lb 3.2 oz)     Height: 5\' 3"  (1.6 m)       Wt Readings from Last 3 Encounters:  09/18/16 113.9 kg (251 lb 3.2 oz)  09/06/16 113.4 kg (250 lb)  08/26/16 112 kg (247 lb)    No intake or output data in the 24 hours ending 09/18/16 0941   Physical Exam  Gen: not in distress HEENT: no pallor, moist mucosa, supple neck Chest: Scattered rhonchi bilaterally CVS: N S1&S2, no murmurs, rubs or gallop GI: soft, NT, ND, BS+ Musculoskeletal: warm, no edema CNS: Alert and oriented    Data Review:    CBC  Recent Labs Lab 09/18/16 0250 09/18/16 0551  WBC 10.0 8.7  HGB 11.5* 11.3*  HCT 37.3 36.5  PLT 261 258  MCV 98.4 98.4  MCH 30.3 30.5  MCHC 30.8 31.0  RDW 14.2 14.5    Chemistries   Recent Labs Lab 09/18/16 0250 09/18/16 0551  NA 141  --   K 4.0  --   CL 100*  --   CO2 32  --   GLUCOSE 130*  --   BUN 14  --   CREATININE 0.81 0.83  CALCIUM 8.8*  --    ------------------------------------------------------------------------------------------------------------------ No results for input(s): CHOL, HDL, LDLCALC, TRIG, CHOLHDL, LDLDIRECT in the last 72  hours.  Lab Results  Component Value Date   HGBA1C 5.6 08/18/2016   ------------------------------------------------------------------------------------------------------------------ No results for input(s): TSH, T4TOTAL, T3FREE, THYROIDAB in the last 72 hours.  Invalid input(s): FREET3 ------------------------------------------------------------------------------------------------------------------ No results for input(s): VITAMINB12, FOLATE, FERRITIN, TIBC, IRON, RETICCTPCT in the last 72 hours.  Coagulation profile No results for input(s): INR, PROTIME in the last 168 hours.  No results for input(s): DDIMER in the last 72 hours.  Cardiac Enzymes No results for input(s): CKMB, TROPONINI, MYOGLOBIN in the last 168 hours.  Invalid input(s): CK ------------------------------------------------------------------------------------------------------------------    Component Value Date/Time   BNP 77.4 09/18/2016 0231    Inpatient Medications  Scheduled Meds: . cholecalciferol  1,000 Units Oral Daily  . DULoxetine  60 mg Oral Daily  . enoxaparin (LOVENOX) injection  40 mg Subcutaneous Daily  . furosemide  20 mg Intravenous Q12H  . gabapentin  300 mg Oral TID  . guaiFENesin  1,200 mg Oral BID  . ipratropium-albuterol  3 mL Nebulization Q6H  . levothyroxine  75 mcg Oral QAC breakfast  . mometasone-formoterol  2 puff Inhalation BID  . multivitamin with minerals  1 tablet Oral Daily  . pantoprazole  40 mg Oral Q1200  . sodium chloride flush  3 mL Intravenous Q12H   Continuous Infusions: PRN Meds:.sodium chloride, acetaminophen, LORazepam, ondansetron **OR** ondansetron (ZOFRAN) IV, oxyCODONE, sodium chloride flush  Micro Results Recent Results (from the past 240 hour(s))  MRSA PCR Screening     Status: None   Collection Time: 09/18/16  6:26 AM  Result Value Ref Range Status   MRSA by PCR NEGATIVE NEGATIVE Final  Comment:        The GeneXpert MRSA Assay (FDA approved for  NASAL specimens only), is one component of a comprehensive MRSA colonization surveillance program. It is not intended to diagnose MRSA infection nor to guide or monitor treatment for MRSA infections.     Radiology Reports Dg Chest Portable 1 View  Result Date: 09/18/2016 CLINICAL DATA:  Shortness of breath tonight EXAM: PORTABLE CHEST 1 VIEW COMPARISON:  10/28/2014 FINDINGS: Shallow inspiration. Cardiac enlargement with vascular congestion and mild interstitial edema. Small bilateral pleural effusions, greater on the left. No focal consolidation. Azygos lobe. Calcification of the aorta. No pneumothorax. IMPRESSION: Cardiac enlargement with pulmonary vascular congestion and mild interstitial edema. Small bilateral pleural effusions. No focal consolidation. Electronically Signed   By: Lucienne Capers M.D.   On: 09/18/2016 02:52    Time Spent in minutes  20   Louellen Molder M.D on 09/18/2016 at 9:41 AM  Between 7am to 7pm - Pager - [phone]  After 7pm go to [email or web address] - password [facility]  [facility] -  Office  [phone]

## 2016-09-18 NOTE — Progress Notes (Signed)
RN calling MD to see if he wants an accurate I&O, since patient is on lasix. Pt is continuously incontinent and wets the bed. RN also told MD that patients urine is foul smelling. RN to I&O patient once and send to lab for analysis.

## 2016-09-18 NOTE — ED Triage Notes (Signed)
Pt transported from [place] with c/o shob, cough and congestion.  Pt recently completed course of antbx for same, per EMS staff reports desat to 85% on 3L.  Pt does desat with exertion IV est by EMS, 20 L AC

## 2016-09-18 NOTE — ED Provider Notes (Signed)
[facility] DEPT Provider Note   CSN: [id number] Arrival date & time: 09/18/16  0220   History   Chief Complaint Chief Complaint  Patient presents with  . Shortness of Breath    HPI Dawn Montoya is a 82 y.o. female.  HPI  Patient has a PMH of COPD, CKD, and CHF comes to the ER with chest congestion and cough. She is a DNR at [facility] and see's Dr. Jodi Mourning, she developed COPD exacerbation/URI symptoms and was treated with prednisone and Levaquin on 11/3, her sputum and symptoms largely cleared up but then by the 14th they started to become thick and white. She uses at home oxygen but was hypoxic on her 3L. She is now having significant coughing up of yellow sputum, reports having night sweats, unsure of fever but reports not feeling well.  She denies weakness, CP, confusion, syncope, AMS, LE swelling or orthopnea.   Past Medical History:  Diagnosis Date  . Anxiety state 02/25/2015  . Breast CA ([place])   . CKD (chronic kidney disease) stage 3, GFR 30-59 ml/min 12/15/2014  . COPD (chronic obstructive pulmonary disease) ([place])   . Diastolic CHF ([place]) 99991111  . GERD without esophagitis 01/19/2016  . Hordeolum externum (stye)    07/01/2016  . HTN (hypertension) 01/18/2014  . Hypertension   . Hypothyroidism 01/18/2014  . Pneumonia   . Spondylosis, cervical, with myelopathy 04/11/2016    Patient Active Problem List   Diagnosis Date Noted  . COPD exacerbation ([place]) 09/18/2016  . Hypotension 08/16/2016  . Spondylosis, cervical, with myelopathy 04/11/2016  . Meningioma ([place]) 04/11/2016  . GERD without esophagitis 01/19/2016  . Benign paroxysmal positional vertigo 07/18/2015  . Neuropathy ([place]) 07/13/2015  . Cervical myelopathy with cervical radiculopathy 05/08/2015  . Anxiety state 02/25/2015  . Solitary pulmonary nodule 01/01/2015  . CKD (chronic kidney disease) stage 3, GFR 30-59 ml/min 12/15/2014  . Carbon dioxide retention 12/15/2014  . Uncontrolled  hypertension 10/25/2014  . Diastolic CHF, chronic ([place]) 02/22/2014  . COPD (chronic obstructive pulmonary disease) ([place]) 01/18/2014  . Anemia 01/18/2014  . Hypothyroidism 01/18/2014    Past Surgical History:  Procedure Laterality Date  . BREAST SURGERY    . CHOLECYSTECTOMY    . COLONOSCOPY N/A 01/20/2014   Procedure: COLONOSCOPY;  Surgeon: Winfield Cunas., MD;  Location: [facility];  Service: Endoscopy;  Laterality: N/A;  . ESOPHAGOGASTRODUODENOSCOPY N/A 01/19/2014   Procedure: ESOPHAGOGASTRODUODENOSCOPY (EGD);  Surgeon: Jeryl Columbia, MD;  Location: [facility];  Service: Endoscopy;  Laterality: N/A;  . MASTECTOMY Left     OB History    No data available       Home Medications    Prior to Admission medications   Medication Sig Start Date End Date Taking? Authorizing Provider  acetaminophen (TYLENOL) 325 MG tablet Take 650 mg by mouth every 6 (six) hours as needed for mild pain. Reported on 04/11/2016    Historical Provider, MD  cholecalciferol (VITAMIN D) 1000 UNITS tablet Take 1,000 Units by mouth daily. Reported on 04/11/2016    Historical Provider, MD  Dextromethorphan-Guaifenesin (MUCINEX DM MAXIMUM STRENGTH) 60-1200 MG TB12 Take 1 capsule by mouth 2 (two) times daily. Reported on 04/11/2016    Historical Provider, MD  DULoxetine (CYMBALTA) 60 MG capsule Take 1 capsule (60 mg total) by mouth daily. 03/29/15   Lauree Chandler, NP  Fluticasone-Salmeterol (ADVAIR) 500-50 MCG/DOSE AEPB Inhale 1 puff into the lungs 2 (two) times daily.    Historical Provider, MD  furosemide (LASIX) 40  MG tablet Take 40 mg by mouth daily.    Historical Provider, MD  gabapentin (NEURONTIN) 300 MG capsule Take 300 mg by mouth 3 (three) times daily.     Historical Provider, MD  Hypromellose (ARTIFICIAL TEARS OP) Apply 1 drop to eye 4 (four) times daily as needed (Both Eyes).    Historical Provider, MD  ipratropium-albuterol (DUONEB) 0.5-2.5 (3) MG/3ML SOLN Take 3 mLs by nebulization every 6 (six)  hours as needed. 09/06/16   Hendricks Limes, MD  levothyroxine (SYNTHROID, LEVOTHROID) 75 MCG tablet Take 75 mcg by mouth daily before breakfast.    Historical Provider, MD  lisinopril (PRINIVIL,ZESTRIL) 5 MG tablet Take 5 mg by mouth daily.    Historical Provider, MD  LORazepam (ATIVAN) 0.5 MG tablet Take one tablet by mouth every 8 hours as needed for anxiety 03/10/16   Marte Celani L Reed, DO  magnesium hydroxide (MILK OF MAGNESIA) 400 MG/5ML suspension Take 30 mLs by mouth once. Reported on 04/11/2016    Historical Provider, MD  Melatonin 3 MG TABS Take by mouth at bedtime.    Historical Provider, MD  Multiple Vitamins-Minerals (MULTIVITAMIN WITH MINERALS) tablet Take 1 tablet by mouth daily.    Historical Provider, MD  nitroGLYCERIN (NITROSTAT) 0.4 MG SL tablet Place 0.4 mg under the tongue every 5 (five) minutes as needed for chest pain. 3 DOSES MAX    Historical Provider, MD  oxyCODONE (OXY IR/ROXICODONE) 5 MG immediate release tablet Take one tablet by mouth every 4 hours as needed for pain 08/02/16   Lauree Chandler, NP  OXYGEN Inhale into the lungs. Nasal O2 2L/min for hypoxia    Historical Provider, MD  pantoprazole (PROTONIX) 40 MG tablet Take 1 tablet (40 mg total) by mouth daily at 12 noon. 10/31/14   Donne Hazel, MD  tiotropium (SPIRIVA HANDIHALER) 18 MCG inhalation capsule Place 1 capsule (18 mcg total) into inhaler and inhale daily. 02/25/14   Charlynne Cousins, MD    Family History Family History  Problem Relation Age of Onset  . Lung cancer Sister   . Breast cancer Sister   . Breast cancer Sister   . Breast cancer Daughter   . Heart attack Neg Hx     Social History Social History  Substance Use Topics  . Smoking status: Former Smoker    Packs/day: 2.00    Years: 50.00    Types: Cigarettes    Quit date: 10/25/1999  . Smokeless tobacco: Never Used  . Alcohol use No     Allergies   Patient has no known allergies.   Review of Systems Review of Systems Review of  Systems All other systems negative except as documented in the HPI. All pertinent positives and negatives as reviewed in the HPI.   Physical Exam Updated Vital Signs BP (!) 127/44   Pulse 71   Temp 98.5 F (36.9 C) (Oral)   Resp 17   SpO2 91%   Physical Exam  Constitutional: She appears well-developed and well-nourished. No distress.  HENT:  Head: Normocephalic and atraumatic.  Right Ear: Tympanic membrane and ear canal normal.  Left Ear: Tympanic membrane and ear canal normal.  Nose: Nose normal.  Mouth/Throat: Uvula is midline, oropharynx is clear and moist and mucous membranes are normal.  Eyes: Pupils are equal, round, and reactive to light.  Neck: Normal range of motion. Neck supple.  Cardiovascular: Normal rate and regular rhythm.   Pulmonary/Chest: No respiratory distress. She has wheezes. She has rales ( + wet  cough). She exhibits no tenderness.  Abdominal: Soft.  No signs of abdominal distention  Musculoskeletal:  No LE swelling  Neurological: She is alert.  Acting at baseline  Skin: Skin is warm and dry. No rash noted.  Nursing note and vitals reviewed.    ED Treatments / Results  Labs (all labs ordered are listed, but only abnormal results are displayed) Labs Reviewed  BASIC METABOLIC PANEL - Abnormal; Notable for the following:       Result Value   Chloride 100 (*)    Glucose, Bld 130 (*)    Calcium 8.8 (*)    All other components within normal limits  CBC - Abnormal; Notable for the following:    RBC 3.79 (*)    Hemoglobin 11.5 (*)    All other components within normal limits  BRAIN NATRIURETIC PEPTIDE  I-STAT TROPOININ, ED    EKG  EKG Interpretation  Date/Time:  Sunday September 18 2016 02:30:13 EST Ventricular Rate:  70 PR Interval:    QRS Duration: 100 QT Interval:  385 QTC Calculation: 416 R Axis:   76 Text Interpretation:  Sinus rhythm Borderline T abnormalities, anterior leads No significant change since last tracing Confirmed by  Betsey Holiday  MD, CHRISTOPHER [phone]) on 09/18/2016 2:39:03 AM       Radiology Dg Chest Portable 1 View  Result Date: 09/18/2016 CLINICAL DATA:  Shortness of breath tonight EXAM: PORTABLE CHEST 1 VIEW COMPARISON:  10/28/2014 FINDINGS: Shallow inspiration. Cardiac enlargement with vascular congestion and mild interstitial edema. Small bilateral pleural effusions, greater on the left. No focal consolidation. Azygos lobe. Calcification of the aorta. No pneumothorax. IMPRESSION: Cardiac enlargement with pulmonary vascular congestion and mild interstitial edema. Small bilateral pleural effusions. No focal consolidation. Electronically Signed   By: Lucienne Capers M.D.   On: 09/18/2016 02:52    Procedures Procedures (including critical care time)  Medications Ordered in ED Medications  ipratropium-albuterol (DUONEB) 0.5-2.5 (3) MG/3ML nebulizer solution 3 mL (3 mLs Nebulization Given 09/18/16 0320)  furosemide (LASIX) injection 20 mg (not administered)  albuterol (PROVENTIL) (2.5 MG/3ML) 0.083% nebulizer solution 5 mg (not administered)  ipratropium (ATROVENT) nebulizer solution 0.5 mg (not administered)     Initial Impression / Assessment and Plan / ED Course  I have reviewed the triage vital signs and the nursing notes.  Pertinent labs & imaging results that were available during my care of the patient were reviewed by me and considered in my medical decision making (see chart for details).  Clinical Course     Dr. Joellyn Quails has seen the patient as well and believes this is of infectious etiology. She has not had a true fever here in the ED and has a normal white count, however, she failed Levaquin at home and desats with movement on her 2-3 L [place]. I feel the patient will require admission.  Dr. Darrick Meigs, up for unassigned with [facility] has agreed for admission. Inpatient, [facility] Lopez de Gutierrez, [place], [place].  He will see her before placing further tests or medication orders.  Final Clinical  Impressions(s) / ED Diagnoses   Final diagnoses:  COPD exacerbation ([place])  Hypoxia  Upper respiratory tract infection, unspecified type    New Prescriptions New Prescriptions   No medications on file     Delos Haring, PA-C 09/18/16 [place], MD 09/19/16 [phone]

## 2016-09-18 NOTE — H&P (Signed)
TRH H&P    Patient Demographics:    Dawn Montoya, is a 82 y.o. female  MRN: [id number]  DOB - 1932/02/11  Admit Date - 09/18/2016    Outpatient Primary MD for the patient is Unice Cobble, MD  Patient coming from: SNF  Chief Complaint  Patient presents with  . Shortness of Breath      HPI:    Dawn Montoya  is a 82 y.o. female, with h/o COPD, CHF, came to ED for shortness of breath, cough. Patient recently developed COPD exacerbation and was treated with prednsone and Levaquin on 11/3.Patient initially improved but by November 14 she started having thick white phlegm. Patient uses oxygen at home but was found to be hypoxic on 3 L and was sent to hospital for further evaluation. Patient denies any chest pain no nausea vomiting or diarrhea.  In the ED chest x-ray showed pulmonary edema, patient given 1 dose of Lasix 20 minute grams IV.    Review of systems:    In addition to the HPI above,  No Fever-chills, No Headache, No changes with Vision or hearing, No problems swallowing food or Liquids, No Abdominal pain, No Nausea or Vomiting, bowel movements are regular, No Blood in stool or Urine, No dysuria, No new skin rashes or bruises, No new joints pains-aches,  No new weakness, tingling, numbness in any extremity, No recent weight gain or loss, No polyuria, polydypsia or polyphagia, No significant Mental Stressors.  A full 10 point Review of Systems was done, except as stated above, all other Review of Systems were negative.   With Past History of the following :    Past Medical History:  Diagnosis Date  . Anxiety state 02/25/2015  . Breast CA ([place])   . CKD (chronic kidney disease) stage 3, GFR 30-59 ml/min 12/15/2014  . COPD (chronic obstructive pulmonary disease) ([place])   . Diastolic CHF ([place]) 99991111  . GERD without esophagitis 01/19/2016  . Hordeolum externum (stye)    07/01/2016  .  HTN (hypertension) 01/18/2014  . Hypertension   . Hypothyroidism 01/18/2014  . Pneumonia   . Spondylosis, cervical, with myelopathy 04/11/2016      Past Surgical History:  Procedure Laterality Date  . BREAST SURGERY    . CHOLECYSTECTOMY    . COLONOSCOPY N/A 01/20/2014   Procedure: COLONOSCOPY;  Surgeon: Winfield Cunas., MD;  Location: [facility];  Service: Endoscopy;  Laterality: N/A;  . ESOPHAGOGASTRODUODENOSCOPY N/A 01/19/2014   Procedure: ESOPHAGOGASTRODUODENOSCOPY (EGD);  Surgeon: Jeryl Columbia, MD;  Location: [facility];  Service: Endoscopy;  Laterality: N/A;  . MASTECTOMY Left       Social History:      Social History  Substance Use Topics  . Smoking status: Former Smoker    Packs/day: 2.00    Years: 50.00    Types: Cigarettes    Quit date: 10/25/1999  . Smokeless tobacco: Never Used  . Alcohol use No       Family History :     Family History  Problem Relation Age of Onset  .  Lung cancer Sister   . Breast cancer Sister   . Breast cancer Sister   . Breast cancer Daughter   . Heart attack Neg Hx       Home Medications:   Prior to Admission medications   Medication Sig Start Date End Date Taking? Authorizing Provider  acetaminophen (TYLENOL) 325 MG tablet Take 650 mg by mouth every 6 (six) hours as needed for mild pain. Reported on 04/11/2016    Historical Provider, MD  cholecalciferol (VITAMIN D) 1000 UNITS tablet Take 1,000 Units by mouth daily. Reported on 04/11/2016    Historical Provider, MD  Dextromethorphan-Guaifenesin (MUCINEX DM MAXIMUM STRENGTH) 60-1200 MG TB12 Take 1 capsule by mouth 2 (two) times daily. Reported on 04/11/2016    Historical Provider, MD  DULoxetine (CYMBALTA) 60 MG capsule Take 1 capsule (60 mg total) by mouth daily. 03/29/15   Lauree Chandler, NP  Fluticasone-Salmeterol (ADVAIR) 500-50 MCG/DOSE AEPB Inhale 1 puff into the lungs 2 (two) times daily.    Historical Provider, MD  furosemide (LASIX) 40 MG tablet Take 40 mg by mouth  daily.    Historical Provider, MD  gabapentin (NEURONTIN) 300 MG capsule Take 300 mg by mouth 3 (three) times daily.     Historical Provider, MD  Hypromellose (ARTIFICIAL TEARS OP) Apply 1 drop to eye 4 (four) times daily as needed (Both Eyes).    Historical Provider, MD  ipratropium-albuterol (DUONEB) 0.5-2.5 (3) MG/3ML SOLN Take 3 mLs by nebulization every 6 (six) hours as needed. 09/06/16   Hendricks Limes, MD  levothyroxine (SYNTHROID, LEVOTHROID) 75 MCG tablet Take 75 mcg by mouth daily before breakfast.    Historical Provider, MD  lisinopril (PRINIVIL,ZESTRIL) 5 MG tablet Take 5 mg by mouth daily.    Historical Provider, MD  LORazepam (ATIVAN) 0.5 MG tablet Take one tablet by mouth every 8 hours as needed for anxiety 03/10/16   Tiffany L Reed, DO  magnesium hydroxide (MILK OF MAGNESIA) 400 MG/5ML suspension Take 30 mLs by mouth once. Reported on 04/11/2016    Historical Provider, MD  Melatonin 3 MG TABS Take by mouth at bedtime.    Historical Provider, MD  Multiple Vitamins-Minerals (MULTIVITAMIN WITH MINERALS) tablet Take 1 tablet by mouth daily.    Historical Provider, MD  nitroGLYCERIN (NITROSTAT) 0.4 MG SL tablet Place 0.4 mg under the tongue every 5 (five) minutes as needed for chest pain. 3 DOSES MAX    Historical Provider, MD  oxyCODONE (OXY IR/ROXICODONE) 5 MG immediate release tablet Take one tablet by mouth every 4 hours as needed for pain 08/02/16   Lauree Chandler, NP  OXYGEN Inhale into the lungs. Nasal O2 2L/min for hypoxia    Historical Provider, MD  pantoprazole (PROTONIX) 40 MG tablet Take 1 tablet (40 mg total) by mouth daily at 12 noon. 10/31/14   Donne Hazel, MD  tiotropium (SPIRIVA HANDIHALER) 18 MCG inhalation capsule Place 1 capsule (18 mcg total) into inhaler and inhale daily. 02/25/14   Charlynne Cousins, MD     Allergies:    No Known Allergies   Physical Exam:   Vitals  Blood pressure (!) 107/53, pulse 68, temperature 98.5 F (36.9 C), temperature source  Oral, resp. rate 15, SpO2 90 %.  1.  General: Appears in no acute distress  2. Psychiatric:  Intact judgement and  insight, awake alert, oriented x 3.  3. Neurologic: No focal neurological deficits, all cranial nerves intact.Strength 5/5 all 4 extremities, sensation intact all 4 extremities, plantars down  going.  4. Eyes :  anicteric sclerae, moist conjunctivae with no lid lag. PERRLA.  5. ENMT:  Oropharynx clear with moist mucous membranes and good dentition  6. Neck:  supple, no cervical lymphadenopathy appriciated, No thyromegaly  7. Respiratory : Decreased breath sounds bilaterally  8. Cardiovascular : RRR, no gallops, rubs or murmurs, bilateral trace leg edema  9. Gastrointestinal:  Positive bowel sounds, abdomen soft, non-tender to palpation,no hepatosplenomegaly, no rigidity or guarding       10. Skin:  No cyanosis, normal texture and turgor, no rash, lesions or ulcers  11.Musculoskeletal:  Good muscle tone,  joints appear normal , no effusions,  normal range of motion    Data Review:    CBC  Recent Labs Lab 09/18/16 0250  WBC 10.0  HGB 11.5*  HCT 37.3  PLT 261  MCV 98.4  MCH 30.3  MCHC 30.8  RDW 14.2   ------------------------------------------------------------------------------------------------------------------  Chemistries   Recent Labs Lab 09/18/16 0250  NA 141  K 4.0  CL 100*  CO2 32  GLUCOSE 130*  BUN 14  CREATININE 0.81  CALCIUM 8.8*   ------------------------------------------------------------------------------------------------------------------  ---------------------------------------------------------------------------------------------- --------------------------------------------------------------------------------------------------------------- Urine analysis:    Component Value Date/Time   COLORURINE YELLOW 10/24/2014 2147   APPEARANCEUR CLOUDY (A) 10/24/2014 2147   LABSPEC 1.016 10/24/2014 2147   PHURINE 5.0  10/24/2014 2147   GLUCOSEU NEGATIVE 10/24/2014 2147   HGBUR TRACE (A) 10/24/2014 2147   BILIRUBINUR NEGATIVE 10/24/2014 2147   [place] NEGATIVE 10/24/2014 2147   PROTEINUR 30 (A) 10/24/2014 2147   UROBILINOGEN 1.0 10/24/2014 2147   NITRITE NEGATIVE 10/24/2014 2147   LEUKOCYTESUR NEGATIVE 10/24/2014 2147      Imaging Results:    Dg Chest Portable 1 View  Result Date: 09/18/2016 CLINICAL DATA:  Shortness of breath tonight EXAM: PORTABLE CHEST 1 VIEW COMPARISON:  10/28/2014 FINDINGS: Shallow inspiration. Cardiac enlargement with vascular congestion and mild interstitial edema. Small bilateral pleural effusions, greater on the left. No focal consolidation. Azygos lobe. Calcification of the aorta. No pneumothorax. IMPRESSION: Cardiac enlargement with pulmonary vascular congestion and mild interstitial edema. Small bilateral pleural effusions. No focal consolidation. Electronically Signed   By: Lucienne Capers M.D.   On: 09/18/2016 02:52    My personal review of EKG: Rhythm NSR, Nonspecific T-wave changes   Assessment & Plan:    Active Problems:   COPD (chronic obstructive pulmonary disease) (HCC)   Hypothyroidism   Hypotension   Pulmonary edema   1. Acute hypoxic respiratory failure- secondary to pulmonary edema, will start Lasix 20 mg IV every 12 hours. Patient also has underlying COPD, we'll start DuoNeb nebs every 6 hours, Mucinex 1 tablet by mouth twice a day. 2. Hypothyroidism- continue Synthroid 3. Hypertension- continue lisinopril    DVT Prophylaxis-   Lovenox   AM Labs Ordered, also please review Full Orders  Family Communication:   Code Status:  DNR  Admission status: Observation   Time spent in minutes : 50 min   Tiny Chaudhary S M.D on 09/18/2016 at 5:26 AM  Between 7am to 7pm - Pager - [phone]. After 7pm go to [email or web address] - password [email or web address]  [facility] - Office  [phone]

## 2016-09-19 DIAGNOSIS — E034 Atrophy of thyroid (acquired): Secondary | ICD-10-CM | POA: Diagnosis not present

## 2016-09-19 DIAGNOSIS — Z801 Family history of malignant neoplasm of trachea, bronchus and lung: Secondary | ICD-10-CM | POA: Diagnosis not present

## 2016-09-19 DIAGNOSIS — Z803 Family history of malignant neoplasm of breast: Secondary | ICD-10-CM | POA: Diagnosis not present

## 2016-09-19 DIAGNOSIS — I959 Hypotension, unspecified: Secondary | ICD-10-CM | POA: Diagnosis present

## 2016-09-19 DIAGNOSIS — Z9981 Dependence on supplemental oxygen: Secondary | ICD-10-CM | POA: Diagnosis not present

## 2016-09-19 DIAGNOSIS — Z9889 Other specified postprocedural states: Secondary | ICD-10-CM | POA: Diagnosis not present

## 2016-09-19 DIAGNOSIS — E1122 Type 2 diabetes mellitus with diabetic chronic kidney disease: Secondary | ICD-10-CM | POA: Diagnosis present

## 2016-09-19 DIAGNOSIS — Z6841 Body Mass Index (BMI) 40.0 and over, adult: Secondary | ICD-10-CM | POA: Diagnosis not present

## 2016-09-19 DIAGNOSIS — D649 Anemia, unspecified: Secondary | ICD-10-CM | POA: Diagnosis present

## 2016-09-19 DIAGNOSIS — J810 Acute pulmonary edema: Secondary | ICD-10-CM

## 2016-09-19 DIAGNOSIS — I5033 Acute on chronic diastolic (congestive) heart failure: Secondary | ICD-10-CM

## 2016-09-19 DIAGNOSIS — E039 Hypothyroidism, unspecified: Secondary | ICD-10-CM | POA: Diagnosis present

## 2016-09-19 DIAGNOSIS — R0902 Hypoxemia: Secondary | ICD-10-CM | POA: Diagnosis present

## 2016-09-19 DIAGNOSIS — J441 Chronic obstructive pulmonary disease with (acute) exacerbation: Secondary | ICD-10-CM | POA: Diagnosis present

## 2016-09-19 DIAGNOSIS — I130 Hypertensive heart and chronic kidney disease with heart failure and stage 1 through stage 4 chronic kidney disease, or unspecified chronic kidney disease: Secondary | ICD-10-CM | POA: Diagnosis present

## 2016-09-19 DIAGNOSIS — K219 Gastro-esophageal reflux disease without esophagitis: Secondary | ICD-10-CM | POA: Diagnosis present

## 2016-09-19 DIAGNOSIS — Z87891 Personal history of nicotine dependence: Secondary | ICD-10-CM | POA: Diagnosis not present

## 2016-09-19 DIAGNOSIS — Z79899 Other long term (current) drug therapy: Secondary | ICD-10-CM | POA: Diagnosis not present

## 2016-09-19 DIAGNOSIS — Z9012 Acquired absence of left breast and nipple: Secondary | ICD-10-CM | POA: Diagnosis not present

## 2016-09-19 DIAGNOSIS — Z853 Personal history of malignant neoplasm of breast: Secondary | ICD-10-CM | POA: Diagnosis not present

## 2016-09-19 DIAGNOSIS — N183 Chronic kidney disease, stage 3 (moderate): Secondary | ICD-10-CM

## 2016-09-19 DIAGNOSIS — E669 Obesity, unspecified: Secondary | ICD-10-CM | POA: Diagnosis present

## 2016-09-19 DIAGNOSIS — J449 Chronic obstructive pulmonary disease, unspecified: Secondary | ICD-10-CM | POA: Diagnosis not present

## 2016-09-19 DIAGNOSIS — Z9049 Acquired absence of other specified parts of digestive tract: Secondary | ICD-10-CM | POA: Diagnosis not present

## 2016-09-19 DIAGNOSIS — Z7951 Long term (current) use of inhaled steroids: Secondary | ICD-10-CM | POA: Diagnosis not present

## 2016-09-19 DIAGNOSIS — Z66 Do not resuscitate: Secondary | ICD-10-CM | POA: Diagnosis present

## 2016-09-19 DIAGNOSIS — R0989 Other specified symptoms and signs involving the circulatory and respiratory systems: Secondary | ICD-10-CM | POA: Diagnosis present

## 2016-09-19 DIAGNOSIS — J9621 Acute and chronic respiratory failure with hypoxia: Secondary | ICD-10-CM | POA: Diagnosis present

## 2016-09-19 DIAGNOSIS — I509 Heart failure, unspecified: Secondary | ICD-10-CM | POA: Diagnosis not present

## 2016-09-19 LAB — CBC
HCT: 40.4 % (ref 36.0–46.0)
Hemoglobin: 12.5 g/dL (ref 12.0–15.0)
MCH: 30 pg (ref 26.0–34.0)
MCHC: 30.9 g/dL (ref 30.0–36.0)
MCV: 97.1 fL (ref 78.0–100.0)
PLATELETS: 268 10*3/uL (ref 150–400)
RBC: 4.16 MIL/uL (ref 3.87–5.11)
RDW: 14 % (ref 11.5–15.5)
WBC: 8.5 10*3/uL (ref 4.0–10.5)

## 2016-09-19 LAB — COMPREHENSIVE METABOLIC PANEL
ALT: 18 U/L (ref 14–54)
AST: 21 U/L (ref 15–41)
Albumin: 3.5 g/dL (ref 3.5–5.0)
Alkaline Phosphatase: 64 U/L (ref 38–126)
Anion gap: 11 (ref 5–15)
BUN: 16 mg/dL (ref 6–20)
CHLORIDE: 98 mmol/L — AB (ref 101–111)
CO2: 33 mmol/L — ABNORMAL HIGH (ref 22–32)
Calcium: 9.4 mg/dL (ref 8.9–10.3)
Creatinine, Ser: 0.78 mg/dL (ref 0.44–1.00)
Glucose, Bld: 109 mg/dL — ABNORMAL HIGH (ref 65–99)
POTASSIUM: 4.2 mmol/L (ref 3.5–5.1)
Sodium: 142 mmol/L (ref 135–145)
Total Bilirubin: 0.4 mg/dL (ref 0.3–1.2)
Total Protein: 7 g/dL (ref 6.5–8.1)

## 2016-09-19 LAB — PHOSPHORUS: PHOSPHORUS: 3.8 mg/dL (ref 2.5–4.6)

## 2016-09-19 LAB — MAGNESIUM: MAGNESIUM: 2.2 mg/dL (ref 1.7–2.4)

## 2016-09-19 NOTE — NC FL2 (Signed)
[place] MEDICAID FL2 LEVEL OF CARE SCREENING TOOL     IDENTIFICATION  Patient Name: Dawn Montoya Birthdate: 1932-07-21 Sex: female Admission Date (Current Location): 09/18/2016  [facility] and [place] Number:  Herbalist and Address:  [facility] [place], [place] [address] [place], [place] [address]      Provider Number: [id number]  Attending Physician Name and Address:  Kerney Elbe, DO  Relative Name and Phone Number:       Current Level of Care: Hospital Recommended Level of Care: [place] Prior Approval Number:    Date Approved/Denied:   PASRR Number:    Discharge Plan: SNF    Current Diagnoses: Patient Active Problem List   Diagnosis Date Noted  . COPD exacerbation ([place]) 09/18/2016  . Pulmonary edema 09/18/2016  . Hypotension 08/16/2016  . Spondylosis, cervical, with myelopathy 04/11/2016  . Meningioma ([place]) 04/11/2016  . GERD without esophagitis 01/19/2016  . Benign paroxysmal positional vertigo 07/18/2015  . Neuropathy ([place]) 07/13/2015  . Cervical myelopathy with cervical radiculopathy 05/08/2015  . Anxiety state 02/25/2015  . Solitary pulmonary nodule 01/01/2015  . CKD (chronic kidney disease) stage 3, GFR 30-59 ml/min 12/15/2014  . Carbon dioxide retention 12/15/2014  . Uncontrolled hypertension 10/25/2014  . Diastolic CHF, chronic ([place]) 02/22/2014  . COPD (chronic obstructive pulmonary disease) ([place]) 01/18/2014  . Anemia 01/18/2014  . Hypothyroidism 01/18/2014    Orientation RESPIRATION BLADDER Height & Weight     Self, Time, Situation, Place  O2 (Nasal cannula 3L) Incontinent Weight: 113.9 kg (251 lb 3.2 oz) Height:  5\' 3"  (160 cm)  BEHAVIORAL SYMPTOMS/MOOD NEUROLOGICAL BOWEL NUTRITION STATUS      Continent Diet (Please see DC Summary)  AMBULATORY STATUS COMMUNICATION OF NEEDS Skin   Extensive Assist Verbally Normal                       Personal Care Assistance Level of Assistance   Bathing, Feeding, Dressing Bathing Assistance: Maximum assistance Feeding assistance: Independent Dressing Assistance: Limited assistance     Functional Limitations Info             SPECIAL CARE FACTORS FREQUENCY  PT (By licensed PT)     PT Frequency: not yet assessed              Contractures      Additional Factors Info  Code Status, Allergies, Isolation Precautions Code Status Info: DNR Allergies Info: NKA     Isolation Precautions Info: MRSA     Current Medications (09/19/2016):  This is the current hospital active medication list Current Facility-Administered Medications  Medication Dose Route Frequency Provider Last Rate Last Dose  . 0.9 %  sodium chloride infusion  250 mL Intravenous PRN Oswald Hillock, MD      . acetaminophen (TYLENOL) tablet 650 mg  650 mg Oral Q6H PRN Oswald Hillock, MD   650 mg at 09/18/16 0914  . albuterol (PROVENTIL) (2.5 MG/3ML) 0.083% nebulizer solution 2.5 mg  2.5 mg Nebulization Q2H PRN Nishant Dhungel, MD      . azithromycin (ZITHROMAX) tablet 250 mg  250 mg Oral Daily Nishant Dhungel, MD   250 mg at 09/19/16 0948  . cholecalciferol (VITAMIN D) tablet 1,000 Units  1,000 Units Oral Daily Oswald Hillock, MD   1,000 Units at 09/19/16 0948  . DULoxetine (CYMBALTA) DR capsule 60 mg  60 mg Oral Daily Oswald Hillock, MD   60 mg at 09/19/16 0949  .  enoxaparin (LOVENOX) injection 40 mg  40 mg Subcutaneous Daily Oswald Hillock, MD   40 mg at 09/19/16 0949  . furosemide (LASIX) injection 40 mg  40 mg Intravenous Q12H Nishant Dhungel, MD   40 mg at 09/19/16 1710  . gabapentin (NEURONTIN) capsule 300 mg  300 mg Oral TID Oswald Hillock, MD   300 mg at 09/19/16 1640  . guaiFENesin (MUCINEX) 12 hr tablet 1,200 mg  1,200 mg Oral BID Oswald Hillock, MD   1,200 mg at 09/19/16 0948  . guaiFENesin-dextromethorphan (ROBITUSSIN DM) 100-10 MG/5ML syrup 5 mL  5 mL Oral Q4H PRN Nishant Dhungel, MD   5 mL at 09/18/16 2332  . ipratropium-albuterol (DUONEB) 0.5-2.5 (3) MG/3ML  nebulizer solution 3 mL  3 mL Nebulization TID Nishant Dhungel, MD   3 mL at 09/19/16 1516  . levothyroxine (SYNTHROID, LEVOTHROID) tablet 75 mcg  75 mcg Oral QAC breakfast Oswald Hillock, MD   75 mcg at 09/19/16 0820  . LORazepam (ATIVAN) tablet 0.5 mg  0.5 mg Oral TID PRN Oswald Hillock, MD   0.5 mg at 09/18/16 2332  . mometasone-formoterol (DULERA) 200-5 MCG/ACT inhaler 2 puff  2 puff Inhalation BID Oswald Hillock, MD   2 puff at 09/19/16 1021  . multivitamin with minerals tablet 1 tablet  1 tablet Oral Daily Oswald Hillock, MD   1 tablet at 09/19/16 0949  . ondansetron (ZOFRAN) tablet 4 mg  4 mg Oral Q6H PRN Oswald Hillock, MD       Or  . ondansetron (ZOFRAN) injection 4 mg  4 mg Intravenous Q6H PRN Oswald Hillock, MD      . oxyCODONE (Oxy IR/ROXICODONE) immediate release tablet 5 mg  5 mg Oral Q6H PRN Oswald Hillock, MD      . pantoprazole (PROTONIX) EC tablet 40 mg  40 mg Oral Q1200 Oswald Hillock, MD   40 mg at 09/19/16 1216  . predniSONE (DELTASONE) tablet 40 mg  40 mg Oral Q breakfast Nishant Dhungel, MD   40 mg at 09/19/16 0820  . sodium chloride flush (NS) 0.9 % injection 3 mL  3 mL Intravenous Q12H Oswald Hillock, MD   3 mL at 09/19/16 1000  . sodium chloride flush (NS) 0.9 % injection 3 mL  3 mL Intravenous PRN Oswald Hillock, MD         Discharge Medications: Please see discharge summary for a list of discharge medications.  Relevant Imaging Results:  Relevant Lab Results:   Additional Information SSN: [id number] [place], [place]

## 2016-09-19 NOTE — Care Management Obs Status (Signed)
[place] NOTIFICATION   Patient Details  Name: Dawn Montoya MRN: [id number] Date of Birth: 1932-06-14   Medicare Observation Status Notification Given:  Yes    Carles Collet, RN 09/19/2016, 1:37 PM

## 2016-09-19 NOTE — Progress Notes (Signed)
PROGRESS NOTE    Dawn Montoya  [id number] DOB: December 12, 1931 DOA: 09/18/2016 PCP: Unice Cobble, MD  Brief Narrative: [name] is an 82 year old obese female resident of [facility] with history of COPD on home O2 (3L via nasal cannula), diastolic CHF, Hypertension, CKD Stage III and Hypothyroidism who presented with cough with yellowish phlegm and increased shortness of breath. Patient recently had COPD exacerbation and was treated with prednisone and Levaquin about 2 weeks back. She denies any fever, chills, nausea, vomiting, chest pain, dysuria or diarrhea. In the ED vitals were stable. Blood work showed mild anemia, normal BNP with chest x-ray showing pulmonary vascular congestion and interstitial edema. Admitted for COPD exacerbation and acute on chronic diastolic CHF.  Assessment & Plan:   Active Problems:   COPD (chronic obstructive pulmonary disease) (HCC)   Hypothyroidism   Hypotension   Pulmonary edema  Acute exacerbation of COPD (chronic obstructive pulmonary disease) (HCC) -Chronically wears 3 Liters of O2 at Home -Continue O2 via nasal cannula (3 L) -C/w DuoNeb 3 mL RT 3 Times daily Scheduled as well as Albuterol Nebs 2.5 mg q2hprn -C/w Guaifenesin 1200 mg po BID, Roitussin DM 5 mL q4hrpn Cough -C/w Prednisone 40 mg po Daily and with Dulera 2 puff BID -C/w Azithromycin Pack (day 2)  Acute Exacerbation of Grade 1 Chronic Diastolic CHF -Patient is on pol Lasix at home.  -C/w IV Lasix 40 mg q12h.  -Strict I's and O's, Daily Weights, SLIV, 1500 mL Fluid Restriction -BNP on Admission was 77.4 -Transthroracic ECHO still pending -CXR yesterday showed Cardiac enlargement with pulmonary vascular congestion and mild interstitial edema. Small bilateral pleural effusions. No focal consolidation. -Repeat CXR in AM  Hypothyroidism -TSH on 11/10/2015 was 4.26 -Continue Levothyroxine 75 mcg po Daily  Essential Hypertension -Holding Home Medications of Lisinopril  5 mg po Daily -C/w IV Lasix and Monitor for Hypotension  Chronic kidney disease stage III -Patient's BUN/Cr this AM was 16/0.78 (14/0.81) -Avoid Nephrotoxic Medications  Anxiety C/w Lorazepam 0.5 mg po TIDprn  DVT prophylaxis: Lovenox 40 mg sq daily Code Status: DNR Family Communication: No Family at bedside during the encounter Disposition Plan: SNF when Stable  Consultants:   None  Procedures: ECHOCARDIOGRAM (pending)  Antimicrobials: po Azithromycin  Subjective: Seen and examined at bedside and was doing better. States she is coughing up yellowish sputum. No N/V/Abdominal Pain. States she needs 4 Liters on Exertion and states she had to be bumped up on her O2 level because of lower saturations. No other concerns or complaints.   Objective: Vitals:   09/18/16 2238 09/19/16 0510 09/19/16 1021 09/19/16 1103  BP: (!) 121/57 (!) 126/52  (!) 94/58  Pulse: 74 67  69  Resp: 18 20  20   Temp: 98.6 F (37 C) 97.7 F (36.5 C)  98.6 F (37 C)  TempSrc: Oral Oral  Oral  SpO2: 92% 95% 92% 94%  Weight:      Height:        Intake/Output Summary (Last 24 hours) at 09/19/16 1253 Last data filed at 09/19/16 0921  Gross per 24 hour  Intake              240 ml  Output                0 ml  Net              240 ml   Filed Weights   09/18/16 0609  Weight: 113.9 kg (251 lb 3.2 oz)  Examination: Physical Exam:  Constitutional: WN/WD, NAD and appears calm and comfortable Eyes: Lids and conjunctivae normal, sclerae anicteric  ENMT: External Ears, Nose appear normal. Diminished hearing.  Neck: Appears normal, supple, no cervical masses, normal ROM, no appreciable thyromegaly Respiratory: Diminished with expiratory wheezing and mild crackles. No rhonchi appreciated. Normal respiratory effort and patient is not tachypenic. No accessory muscle use wearing O2 via [place].  Cardiovascular: RRR, no murmurs / rubs / gallops. S1 and S2 auscultated. No lower extremity edema. Abdomen: Soft,  non-tender, non-distended. No masses palpated. No appreciable hepatosplenomegaly. Bowel sounds positive.  GU: Deferred. Musculoskeletal: No clubbing / cyanosis of digits/nails. No joint deformity upper and lower extremities. Normal strength and muscle tone.  Skin: No rashes, lesions, ulcers. No induration; Warm and dry.  Neurologic: CN 2-12 grossly intact with no focal deficits. Sensation intact in all 4 Extremities. Romberg sign cerebellar reflexes not assessed.  Psychiatric: Normal judgment and insight. Alert and oriented x 3. Normal and pleasant mood and appropriate affect.   Data Reviewed: I have personally reviewed following labs and imaging studies  CBC:  Recent Labs Lab 09/18/16 0250 09/18/16 0551 09/19/16 0648  WBC 10.0 8.7 8.5  HGB 11.5* 11.3* 12.5  HCT 37.3 36.5 40.4  MCV 98.4 98.4 97.1  PLT 261 258 [id number]   Basic Metabolic Panel:  Recent Labs Lab 09/18/16 0250 09/18/16 0551 09/19/16 0648 09/19/16 0845  NA 141  --  142  --   K 4.0  --  4.2  --   CL 100*  --  98*  --   CO2 32  --  33*  --   GLUCOSE 130*  --  109*  --   BUN 14  --  16  --   CREATININE 0.81 0.83 0.78  --   CALCIUM 8.8*  --  9.4  --   MG  --   --   --  2.2  PHOS  --   --   --  3.8   GFR: Estimated Creatinine Clearance: 65.9 mL/min (by C-G formula based on SCr of 0.78 mg/dL). Liver Function Tests:  Recent Labs Lab 09/19/16 0648  AST 21  ALT 18  ALKPHOS 64  BILITOT 0.4  PROT 7.0  ALBUMIN 3.5   No results for input(s): LIPASE, AMYLASE in the last 168 hours. No results for input(s): AMMONIA in the last 168 hours. Coagulation Profile: No results for input(s): INR, PROTIME in the last 168 hours. Cardiac Enzymes: No results for input(s): CKTOTAL, CKMB, CKMBINDEX, TROPONINI in the last 168 hours. BNP (last 3 results) No results for input(s): PROBNP in the last 8760 hours. HbA1C: No results for input(s): HGBA1C in the last 72 hours. CBG: No results for input(s): GLUCAP in the last 168  hours. Lipid Profile: No results for input(s): CHOL, HDL, LDLCALC, TRIG, CHOLHDL, LDLDIRECT in the last 72 hours. Thyroid Function Tests: No results for input(s): TSH, T4TOTAL, FREET4, T3FREE, THYROIDAB in the last 72 hours. Anemia Panel: No results for input(s): VITAMINB12, FOLATE, FERRITIN, TIBC, IRON, RETICCTPCT in the last 72 hours. Sepsis Labs: No results for input(s): PROCALCITON, LATICACIDVEN in the last 168 hours.  Recent Results (from the past 240 hour(s))  MRSA PCR Screening     Status: None   Collection Time: 09/18/16  6:26 AM  Result Value Ref Range Status   MRSA by PCR NEGATIVE NEGATIVE Final    Comment:        The GeneXpert MRSA Assay (FDA approved for NASAL specimens only), is one  component of a comprehensive MRSA colonization surveillance program. It is not intended to diagnose MRSA infection nor to guide or monitor treatment for MRSA infections.     Radiology Studies: Dg Chest Portable 1 View  Result Date: 09/18/2016 CLINICAL DATA:  Shortness of breath tonight EXAM: PORTABLE CHEST 1 VIEW COMPARISON:  10/28/2014 FINDINGS: Shallow inspiration. Cardiac enlargement with vascular congestion and mild interstitial edema. Small bilateral pleural effusions, greater on the left. No focal consolidation. Azygos lobe. Calcification of the aorta. No pneumothorax. IMPRESSION: Cardiac enlargement with pulmonary vascular congestion and mild interstitial edema. Small bilateral pleural effusions. No focal consolidation. Electronically Signed   By: Lucienne Capers M.D.   On: 09/18/2016 02:52   ECHOCARDIOGRAM on 01/19/2014 -Study Conclusions  - Procedure narrative: Transthoracic echocardiography. Technically difficult study with reduced echocardiographic windows. - Left ventricle: The cavity size was normal. Wall thickness was normal. Systolic function was vigorous. The estimated ejection fraction was in the range of 65% to 70%. Wall motion was normal; there were no  regional wall motion abnormalities. Doppler parameters are consistent with abnormal left ventricular relaxation (grade 1 diastolic dysfunction). The E/e' ratio is <10, suggesting normal LV filling pressure. - Aortic valve: Poorly visualized. There was no stenosis. - Mitral valve: Poorly visualized. No significant regurgitation. - Left atrium: The atrium was normal in size. - Right atrium: Moderately dilated (22 cm2). - Tricuspid valve: Mild regurgitation. Incomplete TR jet does not allow measurement of gradient. - Inferior vena cava: The vessel was normal in size; the respirophasic diameter changes were in the normal range (= 50%); findings are consistent with normal central venous pressure. - Pericardium, extracardiac: There was no pericardial effusion.  Scheduled Meds: . azithromycin  250 mg Oral Daily  . cholecalciferol  1,000 Units Oral Daily  . DULoxetine  60 mg Oral Daily  . enoxaparin (LOVENOX) injection  40 mg Subcutaneous Daily  . furosemide  40 mg Intravenous Q12H  . gabapentin  300 mg Oral TID  . guaiFENesin  1,200 mg Oral BID  . ipratropium-albuterol  3 mL Nebulization TID  . levothyroxine  75 mcg Oral QAC breakfast  . mometasone-formoterol  2 puff Inhalation BID  . multivitamin with minerals  1 tablet Oral Daily  . pantoprazole  40 mg Oral Q1200  . predniSONE  40 mg Oral Q breakfast  . sodium chloride flush  3 mL Intravenous Q12H   Continuous Infusions:   LOS: 1 day   Kerney Elbe, DO [facility] Pager [phone]  If 7PM-7AM, please contact night-coverage [email or web address] Password [facility] [place] 09/19/2016, 12:53 PM

## 2016-09-19 NOTE — Progress Notes (Signed)
Pts.BP 94/58 pt Asymptomatic Md noticed and made aware will continue to monitor per MD

## 2016-09-19 NOTE — Care Management CC44 (Signed)
Condition Code 44 Documentation Completed  Patient Details  Name: Nayelli Rindels MRN: [id number] Date of Birth: [birth year]   Condition Code 44 given:  Yes Patient signature on Condition Code 44 notice:  Yes Documentation of 2 MD's agreement:  Yes Code 44 added to claim:  Yes    Carles Collet, RN 09/19/2016, 1:37 PM

## 2016-09-19 NOTE — Care Management Note (Signed)
Case Management Note  Patient Details  Name: Dawn Montoya MRN: [id number] Date of Birth: 1932/01/15  Subjective/Objective:                 Spoke with patient at the bedside. She states she plans to return to [facility] when [facility]. Admitted with pulmonary edema and COPD.    Action/Plan:  DC to SNF when medically clear as facilitated by CSW.    Expected Discharge Date:                  Expected Discharge Plan:  (S) [place] (From [place])  In-House Referral:  Clinical Social Work  Discharge planning Services  CM Consult  Post Acute Care Choice:    Choice offered to:     DME Arranged:    DME Agency:     HH Arranged:    [place] Agency:     Status of Service:  Completed, signed off  If discussed at [employer], dates discussed:    Additional Comments:  Carles Collet, RN 09/19/2016, 1:38 PM

## 2016-09-20 ENCOUNTER — Inpatient Hospital Stay (HOSPITAL_COMMUNITY): Payer: Medicare Other

## 2016-09-20 DIAGNOSIS — I509 Heart failure, unspecified: Secondary | ICD-10-CM

## 2016-09-20 LAB — CBC WITH DIFFERENTIAL/PLATELET
BASOS ABS: 0 10*3/uL (ref 0.0–0.1)
BASOS PCT: 0 %
EOS ABS: 0.1 10*3/uL (ref 0.0–0.7)
Eosinophils Relative: 1 %
HEMATOCRIT: 40.5 % (ref 36.0–46.0)
Hemoglobin: 12.7 g/dL (ref 12.0–15.0)
Lymphocytes Relative: 16 %
Lymphs Abs: 1.8 10*3/uL (ref 0.7–4.0)
MCH: 30.2 pg (ref 26.0–34.0)
MCHC: 31.4 g/dL (ref 30.0–36.0)
MCV: 96.4 fL (ref 78.0–100.0)
MONO ABS: 0.7 10*3/uL (ref 0.1–1.0)
MONOS PCT: 7 %
NEUTROS ABS: 8.6 10*3/uL — AB (ref 1.7–7.7)
Neutrophils Relative %: 76 %
PLATELETS: 340 10*3/uL (ref 150–400)
RBC: 4.2 MIL/uL (ref 3.87–5.11)
RDW: 14.1 % (ref 11.5–15.5)
WBC: 11.2 10*3/uL — ABNORMAL HIGH (ref 4.0–10.5)

## 2016-09-20 LAB — COMPREHENSIVE METABOLIC PANEL
ALBUMIN: 3.8 g/dL (ref 3.5–5.0)
ALT: 19 U/L (ref 14–54)
ANION GAP: 12 (ref 5–15)
AST: 19 U/L (ref 15–41)
Alkaline Phosphatase: 63 U/L (ref 38–126)
BILIRUBIN TOTAL: 0.7 mg/dL (ref 0.3–1.2)
BUN: 25 mg/dL — AB (ref 6–20)
CHLORIDE: 93 mmol/L — AB (ref 101–111)
CO2: 34 mmol/L — AB (ref 22–32)
Calcium: 9.3 mg/dL (ref 8.9–10.3)
Creatinine, Ser: 0.98 mg/dL (ref 0.44–1.00)
GFR calc Af Amer: >60 mL/min (ref >60)
GFR calc non Af Amer: 52 mL/min — ABNORMAL LOW (ref >60)
GLUCOSE: 97 mg/dL (ref 65–99)
POTASSIUM: 3.5 mmol/L (ref 3.5–5.1)
SODIUM: 139 mmol/L (ref 135–145)
TOTAL PROTEIN: 7 g/dL (ref 6.5–8.1)

## 2016-09-20 LAB — ECHOCARDIOGRAM COMPLETE
Height: 63 in
Weight: 4019.2 oz

## 2016-09-20 LAB — PHOSPHORUS: Phosphorus: 4.1 mg/dL (ref 2.5–4.6)

## 2016-09-20 LAB — MAGNESIUM: Magnesium: 2.1 mg/dL (ref 1.7–2.4)

## 2016-09-20 MED ORDER — AZITHROMYCIN 250 MG PO TABS: 250.0000 mg | ORAL_TABLET | Freq: Every day | ORAL | 0 refills | Status: AC

## 2016-09-20 MED ORDER — LORAZEPAM 0.5 MG PO TABS: ORAL_TABLET | ORAL | 0 refills | Status: DC

## 2016-09-20 MED ORDER — PREDNISONE 20 MG PO TABS: 40.0000 mg | ORAL_TABLET | Freq: Every day | ORAL | 0 refills | Status: AC

## 2016-09-20 MED ORDER — FUROSEMIDE 40 MG PO TABS
40.0000 mg | ORAL_TABLET | Freq: Every day | ORAL | Status: DC
  Administered 2016-09-20: 40 mg via ORAL
  Filled 2016-09-20: qty 1

## 2016-09-20 NOTE — Progress Notes (Signed)
Patient will DC to: [facility] Anticipated DC date: 09/20/16 Family notified: Pt alerting family Transport by: Corey Harold (may be behind)   Per MD patient ready for DC to [facility]. RN, patient, patient's family, and facility notified of DC. Discharge Summary sent to facility. RN given number for report. DC packet on chart. Ambulance transport requested for patient.   CSW signing off.  Cedric Fishman, [place] Social Worker [phone]

## 2016-09-20 NOTE — Progress Notes (Signed)
NURSING PROGRESS NOTE  Dawn Montoya [id number] Discharge Data: 09/20/2016 3:46 PM Attending Provider: Patrecia Pour, MD [id number] Linna Darner, MD     Hassan Buckler to be D/C'd Skilled nursing facility per MD order.  Discussed with the patient the After Visit Summary and all questions fully answered. All IV's discontinued with no bleeding noted. All belongings returned to patient for patient to take to SNF. Report given to Byron, receiving nurse at [facility]. Pt will be transferred via [facility].   Last Vital Signs:  Blood pressure 128/68, pulse 74, temperature 98.1 F (36.7 C), temperature source Oral, resp. rate 18, height 5\' 3"  (1.6 m), weight 113.9 kg (251 lb 3.2 oz), SpO2 98 %.  Discharge Medication List   Medication List    TAKE these medications   acetaminophen 325 MG tablet Commonly known as:  TYLENOL Take 650 mg by mouth every 6 (six) hours as needed for mild pain. Reported on 04/11/2016   albuterol 108 (90 Base) MCG/ACT inhaler Commonly known as:  PROVENTIL HFA;VENTOLIN HFA Inhale 2 puffs into the lungs every 6 (six) hours as needed for wheezing or shortness of breath.   albuterol (2.5 MG/3ML) 0.083% nebulizer solution Commonly known as:  PROVENTIL Take 2.5 mg by nebulization every 6 (six) hours as needed for wheezing or shortness of breath.   ARTIFICIAL TEARS OP Apply 1 drop to eye 4 (four) times daily as needed (Both Eyes).   azithromycin 250 MG tablet Commonly known as:  ZITHROMAX Take 1 tablet (250 mg total) by mouth daily. Start taking on:  09/21/2016   bisacodyl 10 MG suppository Commonly known as:  DULCOLAX Place 10 mg rectally daily as needed for moderate constipation.   budesonide 0.25 MG/2ML nebulizer solution Commonly known as:  PULMICORT Take 0.25 mg by nebulization 2 (two) times daily.   cholecalciferol 1000 units tablet Commonly known as:  VITAMIN D Take 1,000 Units by mouth daily. Reported on 04/11/2016   DULoxetine 60 MG capsule Commonly known as:   CYMBALTA Take 1 capsule (60 mg total) by mouth daily.   Fluticasone-Salmeterol 500-50 MCG/DOSE Aepb Commonly known as:  ADVAIR Inhale 1 puff into the lungs 2 (two) times daily.   furosemide 40 MG tablet Commonly known as:  LASIX Take 40 mg by mouth daily.   gabapentin 300 MG capsule Commonly known as:  NEURONTIN Take 300 mg by mouth 3 (three) times daily.   levothyroxine 75 MCG tablet Commonly known as:  SYNTHROID, LEVOTHROID Take 75 mcg by mouth daily before breakfast.   lisinopril 5 MG tablet Commonly known as:  PRINIVIL,ZESTRIL Take 5 mg by mouth daily.   LORazepam 0.5 MG tablet Commonly known as:  ATIVAN Take one tablet by mouth every 8 hours as needed for anxiety   magnesium hydroxide 400 MG/5ML suspension Commonly known as:  MILK OF MAGNESIA Take 30 mLs by mouth daily as needed for mild constipation. Reported on 04/11/2016   Melatonin 3 MG Tabs Take 3 mg by mouth at bedtime.   MUCINEX DM MAXIMUM STRENGTH 60-1200 MG Tb12 Take 1 capsule by mouth 2 (two) times daily. Reported on 04/11/2016   nitroGLYCERIN 0.4 MG SL tablet Commonly known as:  NITROSTAT Place 0.4 mg under the tongue every 5 (five) minutes as needed for chest pain. 3 DOSES MAX   oxyCODONE 5 MG immediate release tablet Commonly known as:  Oxy IR/ROXICODONE Take one tablet by mouth every 4 hours as needed for pain   OXYGEN Inhale into the lungs. Nasal O2 3L/min for hypoxia  pantoprazole 40 MG tablet Commonly known as:  PROTONIX Take 1 tablet (40 mg total) by mouth daily at 12 noon.   predniSONE 20 MG tablet Commonly known as:  DELTASONE Take 2 tablets (40 mg total) by mouth daily with breakfast. Start taking on:  09/21/2016   RA SALINE ENEMA RE Place 1 Applicatorful rectally daily as needed (constipation).   tiotropium 18 MCG inhalation capsule Commonly known as:  SPIRIVA HANDIHALER Place 1 capsule (18 mcg total) into inhaler and inhale daily.        Charolette Child, RN

## 2016-09-20 NOTE — Clinical Social Work Note (Signed)
Clinical Social Work Assessment  Patient Details  Name: Dawn Montoya MRN: [id number] Date of Birth: [birth year]  Date of referral:  09/20/16               Reason for consult:  Discharge Planning                Permission sought to share information with:  Facility Sport and exercise psychologist, Family Supports Permission granted to share information::  Yes, Verbal Permission Granted  Name::        Agency::  [facility]  Relationship::     Contact Information:     Housing/Transportation Living arrangements for the past 2 months:  [place] of Information:  Patient Patient Interpreter Needed:  None Criminal Activity/Legal Involvement Pertinent to Current Situation/Hospitalization:  No - Comment as needed Significant Relationships:  Friend Lives with:  Self, Facility Resident Do you feel safe going back to the place where you live?  Yes Need for family participation in patient care:  No (Coment)  Care giving concerns:  CSW received consult for discharge planning. Patient reports that she is from [facility] and would like to return. CSW to continue to follow and assist with discharge planning needs.   Social Worker assessment / plan:  Patient will discharge back to [place]. CSW will follow up with facility for needs.   Employment status:  Retired Forensic scientist:  Medicare PT Recommendations:  Not assessed at this time [place] / Referral to community resources:  [place]  Patient/Family's Response to care:  Patient is eager to get back to [place] and hopes she will be stronger.   Patient/Family's Understanding of and Emotional Response to Diagnosis, Current Treatment, and Prognosis:  Patient/family is realistic regarding therapy needs and expressed being hopeful for SNF placement. Patient expressed understanding of CSW role and discharge process. No questions/concerns about plan or treatment.    Emotional Assessment Appearance:   Appears stated age Attitude/Demeanor/Rapport:  Other (Appropriate) Affect (typically observed):  Accepting, Appropriate Orientation:  Oriented to Self, Oriented to Situation, Oriented to Place, Oriented to  Time Alcohol / Substance use:  Not Applicable Psych involvement (Current and /or in the community):  No (Comment)  Discharge Needs  Concerns to be addressed:  Care Coordination Readmission within the last 30 days:  No Current discharge risk:  None Barriers to Discharge:  No Barriers Identified   Benard Halsted, [place] 09/20/2016, 3:37 PM

## 2016-09-20 NOTE — Care Management Note (Signed)
Case Management Note  Patient Details  Name: Dawn Montoya MRN: [id number] Date of Birth: 1932/08/24  Subjective/Objective:          Admitted with pulmonary edema, from  [facility] SNF with history of COPD on home O2 (3L via nasal cannula), diastolic CHF, Hypertension, CKD Stage III and Hypothyroidism.          Action/Plan: Plan is to back to SNF today.  Expected Discharge Date:    09/20/2016           Expected Discharge Plan:  (S) [place] (From [place])  In-House Referral:  Clinical Social Work  Discharge planning Services  CM Consult   Status of Service:  Completed, signed off  If discussed at [employer], dates discussed:    Additional Comments:  Sharin Mons, RN 09/20/2016, 12:11 PM

## 2016-09-20 NOTE — Progress Notes (Signed)
  Echocardiogram 2D Echocardiogram has been performed.  Jeffree Cazeau 09/20/2016, 11:11 AM

## 2016-09-20 NOTE — Discharge Summary (Addendum)
Physician Discharge Summary  Dawn Montoya [id number] DOB: 1932-02-25 DOA: 09/18/2016  PCP: Unice Cobble, MD  Admit date: 09/18/2016 Discharge date: 09/23/2016  Admitted From: SNF Disposition: SNF   Recommendations for Outpatient Follow-up:  1. Follow up with PCP in 1-2 weeks 2. Please obtain BMP/CBC in one week 3. Please follow up on the following pending results:  Home Health: None new Equipment/Devices: Continue 3L O2 Discharge Condition: Stable CODE STATUS: DNR Diet recommendation: Heart healthy  Brief/Interim Summary: [name] is an 82 year old obese female resident of [facility] with history of COPD on home O2 (3L via nasal cannula), diastolic CHF, Hypertension, CKD Stage III and Hypothyroidism who presented with cough with yellowish phlegm and increased shortness of breath. Patient recently had COPD exacerbation and was treated with prednisone and Levaquin about 2 weeks back. She denies any fever, chills, nausea, vomiting, chest pain, dysuria or diarrhea. In the ED vitals were stable. Blood work showed mild anemia, normal BNP with chest x-ray showing pulmonary vascular congestion and interstitial edema. Admitted for COPD exacerbation and acute on chronic diastolic CHF. Lasix 40mg  IV q12h was given with significant improvement of symptoms and resolution of vascular congestion seen on CXR. The patient reports she has returned to her baseline respiratory functioning, but continues to have yellow sputum which has decreased in volume since admission. She will be discharged on lasix 40mg  po daily and to complete a 5 day course of azithromycin and prednisone.   Discharge Diagnoses:  Principal Problem:   Acute on chronic respiratory failure with hypoxemia (HCC) Active Problems:   COPD (chronic obstructive pulmonary disease) (HCC)   Hypothyroidism   Hypotension   Pulmonary edema  Acute on chronic hypoxemic respiratory failure: Resolved  Acute exacerbation of COPD  (chronic obstructive pulmonary disease) (HCC) - Chronically wears 3 Liters of O2 at Home: Back to that baseline - Continue O2 via nasal cannula (3 L) - Continue prednisone and azithromycin, final dose 11/30. - Continue bronchodilators as below.  Acute Exacerbation of Grade 1 Chronic Diastolic CHF - Transitioning Lasix 40 mg IV q12h > 40mg  po daily - 1500 mL Fluid Restriction - BNP on Admission was 77.4 - Echocardiogram showed G1DD with preserved EF (see below) - CXR 11/26 showed Cardiac enlargement with pulmonary vascular congestion and mild interstitial edema. Small bilateral pleural effusions. No focal consolidation. Repeat 11/28 shows clearance of edema.   Chronic kidney disease stage III - Ok to restart low dose ACE - Avoid Nephrotoxic Medications  Anxiety - Continue Lorazepam 0.5 mg po TIDprn  Discharge Instructions Discharge Instructions    (HEART FAILURE PATIENTS) Call MD:  Anytime you have any of the following symptoms: 1) 3 pound weight gain in 24 hours or 5 pounds in 1 week 2) shortness of breath, with or without a dry hacking cough 3) swelling in the hands, feet or stomach 4) if you have to sleep on extra pillows at night in order to breathe.    Complete by:  As directed    Diet - low sodium heart healthy    Complete by:  As directed    Discharge instructions    Complete by:  As directed    SEE DISCHARGE SUMMARY   Increase activity slowly    Complete by:  As directed        Medication List    TAKE these medications   acetaminophen 325 MG tablet Commonly known as:  TYLENOL Take 650 mg by mouth every 6 (six) hours as needed for mild pain.  Reported on 04/11/2016   albuterol 108 (90 Base) MCG/ACT inhaler Commonly known as:  PROVENTIL HFA;VENTOLIN HFA Inhale 2 puffs into the lungs every 6 (six) hours as needed for wheezing or shortness of breath.   ARTIFICIAL TEARS OP Apply 1 drop to eye 4 (four) times daily as needed (Both Eyes).   azithromycin 250 MG  tablet Commonly known as:  ZITHROMAX Take 1 tablet (250 mg total) by mouth daily.   budesonide 0.25 MG/2ML nebulizer solution Commonly known as:  PULMICORT Take 0.25 mg by nebulization 2 (two) times daily.   cholecalciferol 1000 units tablet Commonly known as:  VITAMIN D Take 1,000 Units by mouth daily. Reported on 04/11/2016   DULoxetine 60 MG capsule Commonly known as:  CYMBALTA Take 1 capsule (60 mg total) by mouth daily.   Fluticasone-Salmeterol 500-50 MCG/DOSE Aepb Commonly known as:  ADVAIR One puff every 12 hours, gargle and spit after use   furosemide 40 MG tablet Commonly known as:  LASIX Take 40 mg by mouth daily.   gabapentin 300 MG capsule Commonly known as:  NEURONTIN Take 300 mg by mouth 3 (three) times daily.   levothyroxine 75 MCG tablet Commonly known as:  SYNTHROID, LEVOTHROID Take 75 mcg by mouth daily before breakfast.   lisinopril 5 MG tablet Commonly known as:  PRINIVIL,ZESTRIL Take 5 mg by mouth daily.   LORazepam 0.5 MG tablet Commonly known as:  ATIVAN Take one tablet by mouth every 8 hours as needed for anxiety   Melatonin 3 MG Tabs Take 3 mg by mouth at bedtime.   MUCINEX DM MAXIMUM STRENGTH 60-1200 MG Tb12 Take 1 capsule by mouth 2 (two) times daily. Reported on 04/11/2016   nitroGLYCERIN 0.4 MG SL tablet Commonly known as:  NITROSTAT Place 0.4 mg under the tongue every 5 (five) minutes as needed for chest pain. 3 DOSES MAX   oxyCODONE 5 MG immediate release tablet Commonly known as:  Oxy IR/ROXICODONE Take one tablet by mouth every 4 hours as needed for pain What changed:  additional instructions   OXYGEN Inhale into the lungs. Nasal O2 3L/min for hypoxia   pantoprazole 40 MG tablet Commonly known as:  PROTONIX Take 1 tablet (40 mg total) by mouth daily at 12 noon. What changed:  how much to take  how to take this  when to take this  additional instructions   predniSONE 20 MG tablet Commonly known as:  DELTASONE Take  2 tablets (40 mg total) by mouth daily with breakfast.   RA SALINE ENEMA RE Place 1 Applicatorful rectally daily as needed (constipation).   tiotropium 18 MCG inhalation capsule Commonly known as:  SPIRIVA HANDIHALER Place 1 capsule (18 mcg total) into inhaler and inhale daily.      Follow-up Information    Unice Cobble, MD Follow up.   Specialty:  Internal Medicine Contact information: [place] [address] [phone]          No Known Allergies  Consultations:  None  Procedures/Studies: Dg Chest Port 1 View  Result Date: 09/20/2016 CLINICAL DATA:  Shortness of breath.  CHF. EXAM: PORTABLE CHEST 1 VIEW COMPARISON:  09/18/2016. FINDINGS: Stable cardiomegaly. Interim clearing of pulmonary interstitial prominence consistent with clearing congestive heart failure. Small left pleural effusion cannot be excluded. Mild basilar atelectasis. IMPRESSION: 1. Cardiomegaly with interim near complete clearing of pulmonary interstitial edema. Small left pleural effusion. 2.  Low lung volumes. Electronically Signed   By: Marcello Moores  Register   On: 09/20/2016 07:49  Dg Chest Portable 1 View  Result Date: 09/18/2016 CLINICAL DATA:  Shortness of breath tonight EXAM: PORTABLE CHEST 1 VIEW COMPARISON:  10/28/2014 FINDINGS: Shallow inspiration. Cardiac enlargement with vascular congestion and mild interstitial edema. Small bilateral pleural effusions, greater on the left. No focal consolidation. Azygos lobe. Calcification of the aorta. No pneumothorax. IMPRESSION: Cardiac enlargement with pulmonary vascular congestion and mild interstitial edema. Small bilateral pleural effusions. No focal consolidation. Electronically Signed   By: Lucienne Capers M.D.   On: 09/18/2016 02:52    Echocardiogram:  Study Conclusions  - Left ventricle: The cavity size was normal. There was moderate   focal basal hypertrophy. Systolic function was normal. The   estimated ejection fraction was in  the range of 60% to 65%. Wall   motion was normal; there were no regional wall motion   abnormalities. There was an increased relative contribution of   atrial contraction to ventricular filling. Doppler parameters are   consistent with abnormal left ventricular relaxation (grade 1   diastolic dysfunction). - Aortic valve: Trileaflet; mildly thickened, mildly calcified   leaflets.  Subjective: Feels generally better, still coughing yellow sputum - turning more whitish. No fevers, chills, chest pain, palpitations. Dyspnea on exertion is at premorbid baseline on 3L oxygen by nasal cannula.  Discharge Exam:  Vitals:   09/19/16 2245 09/20/16 0443 09/20/16 0454 09/20/16 1516  BP: (!) 129/41 (!) 113/38 (!) 121/43 128/68  Pulse: 68 69 70 74  Resp: 18 15  18   Temp: 98.3 F (36.8 C) 98.1 F (36.7 C)    TempSrc: Oral Oral    SpO2: 99% 98%    Weight:      Height:       General: Pt is alert, awake, not in acute distress Cardiovascular: RRR, S1/S2 +, no murmur, rubs, no gallops. No JVD or pedal edema. Respiratory: Nonlabored with 3L O2 by nasal cannula, winded getting to bedside chair. Diffusely diminished breath sounds with end-expiratory wheezing. No crackles.   The results of significant diagnostics from this hospitalization (including imaging, microbiology, ancillary and laboratory) are listed below for reference.    Microbiology: Recent Results (from the past 240 hour(s))  MRSA PCR Screening     Status: None   Collection Time: 09/18/16  6:26 AM  Result Value Ref Range Status   MRSA by PCR NEGATIVE NEGATIVE Final    Comment:        The GeneXpert MRSA Assay (FDA approved for NASAL specimens only), is one component of a comprehensive MRSA colonization surveillance program. It is not intended to diagnose MRSA infection nor to guide or monitor treatment for MRSA infections.      Labs: BNP (last 3 results)  Recent Labs  09/18/16 0231  BNP [id number]   Basic Metabolic  Panel:  Recent Labs Lab 09/18/16 0250 09/18/16 0551 09/19/16 0648 09/19/16 0845 09/20/16 0525  NA 141  --  142  --  139  K 4.0  --  4.2  --  3.5  CL 100*  --  98*  --  93*  CO2 32  --  33*  --  34*  GLUCOSE 130*  --  109*  --  97  BUN 14  --  16  --  25*  CREATININE 0.81 0.83 0.78  --  0.98  CALCIUM 8.8*  --  9.4  --  9.3  MG  --   --   --  2.2 2.1  PHOS  --   --   --  3.8  4.1   Liver Function Tests:  Recent Labs Lab 09/19/16 0648 09/20/16 0525  AST 21 19  ALT 18 19  ALKPHOS 64 63  BILITOT 0.4 0.7  PROT 7.0 7.0  ALBUMIN 3.5 3.8   CBC:  Recent Labs Lab 09/18/16 0250 09/18/16 0551 09/19/16 0648 09/20/16 0525  WBC 10.0 8.7 8.5 11.2*  NEUTROABS  --   --   --  8.6*  HGB 11.5* 11.3* 12.5 12.7  HCT 37.3 36.5 40.4 40.5  MCV 98.4 98.4 97.1 96.4  PLT 261 258 268 340   Urinalysis    Component Value Date/Time   COLORURINE YELLOW 09/18/2016 1637   APPEARANCEUR CLOUDY (A) 09/18/2016 1637   LABSPEC 1.015 09/18/2016 1637   PHURINE 7.0 09/18/2016 1637   GLUCOSEU NEGATIVE 09/18/2016 1637   HGBUR NEGATIVE 09/18/2016 1637   BILIRUBINUR NEGATIVE 09/18/2016 1637   KETONESUR NEGATIVE 09/18/2016 1637   PROTEINUR NEGATIVE 09/18/2016 1637   UROBILINOGEN 1.0 10/24/2014 2147   NITRITE NEGATIVE 09/18/2016 1637   LEUKOCYTESUR NEGATIVE 09/18/2016 1637   Microbiology Recent Results (from the past 240 hour(s))  MRSA PCR Screening     Status: None   Collection Time: 09/18/16  6:26 AM  Result Value Ref Range Status   MRSA by PCR NEGATIVE NEGATIVE Final    Comment:        The GeneXpert MRSA Assay (FDA approved for NASAL specimens only), is one component of a comprehensive MRSA colonization surveillance program. It is not intended to diagnose MRSA infection nor to guide or monitor treatment for MRSA infections.    Time coordinating discharge: Over 30 minutes  Vance Gather, MD  [facility] 09/23/2016, 3:04 PM Pager [phone]  If 7PM-7AM, please contact  night-coverage [email or web address] Password [email or web address]

## 2016-09-21 ENCOUNTER — Telehealth: Payer: Self-pay

## 2016-09-21 NOTE — Telephone Encounter (Signed)
Possible re-admission to facility. This is a patient you were seeing at [facility]. [facility] F/U is needed if patient was re-admitted to facility upon discharge. Hospital discharge from [facility] on 09/20/16.

## 2016-09-22 ENCOUNTER — Non-Acute Institutional Stay (SKILLED_NURSING_FACILITY): Payer: Medicare Other | Admitting: Internal Medicine

## 2016-09-22 ENCOUNTER — Encounter: Payer: Self-pay | Admitting: Internal Medicine

## 2016-09-22 DIAGNOSIS — I5032 Chronic diastolic (congestive) heart failure: Secondary | ICD-10-CM

## 2016-09-22 DIAGNOSIS — J441 Chronic obstructive pulmonary disease with (acute) exacerbation: Secondary | ICD-10-CM | POA: Diagnosis not present

## 2016-09-22 NOTE — Assessment & Plan Note (Signed)
Clinically at baseline with minor residual rhonchi and wheezes. Appropriate albuterol use was discussed with the patient and potential adverse effects of excess use discussed. Follow-up with Dr.McQuaid to establish a preventive pulmonary toilet program.

## 2016-09-22 NOTE — Progress Notes (Signed)
   [facility] Room: [place], [place] [address]  Code Status: DNR  This is a nursing facility follow up for [place] readmission within 30 days  Interim medical record and care since last [place] visit was updated with review of diagnostic studies and change in clinical status since last visit were documented.  HPI: The patient was hospitalized 11/26-11/28/17 for COPD exacerbation and acute on chronic diastolic congestive heart failure. Lasix 40 mg IV every 12 hours resulted in clinical improvement and resolution of vascular congestion seen on chest x-ray. She had returned to her baseline but continued to have purulent sputum. She was discharged to SNF on Lasix 40 mg daily along with completion of a course of azithromycin and prednisone.  Review of systems: She continues to have some sputum but it is clearing.She denies significant cardiopulmonary symptoms at this time. The edema of her ankles has resolved. The patient states she does not add salt at the table. Today she experienced midabdominal pain which resolved with bowel elimination. There was no associated melena or rectal bleeding. Symptoms resolved completely and she had no other associated GI symptomatology. o Cnstitutional: No fever,significant, fatigue  Eyes: No redness, discharge, pain, vision change ENT/mouth: No nasal congestion,  purulent discharge, earache,change in hearing ,sore throat  Cardiovascular: No chest pain, palpitations,paroxysmal nocturnal dyspnea, claudication, edema  Respiratory: No cough, sputum production,hemoptysis, DOE , significant snoring,apnea   Gastrointestinal: No heartburn,dysphagia, nausea / vomiting,rectal bleeding, melena,change in bowels Genitourinary: No dysuria,hematuria, pyuria,  incontinence, nocturia Musculoskeletal: No joint stiffness, joint swelling, weakness,pain Dermatologic: No rash, pruritus, change in  appearance of skin Neurologic: No dizziness,headache,syncope, seizures, numbness , tingling Psychiatric: No significant anxiety , depression, insomnia, anorexia Endocrine: No change in hair/skin/ nails, excessive thirst, excessive hunger, excessive urination  Hematologic/lymphatic: No significant bruising, lymphadenopathy,abnormal bleeding Allergy/immunology: No itchy/ watery eyes, significant sneezing, urticaria, angioedema  Physical exam:  Pertinent or positive findings: The patient exhibits arcus senilis. She has complete dentures. Breath sounds are decreased overall, but she does have low-grade rhonchi at the bases which resolved with deep breathing. Also noted were some expiratory wheezes when she exerted herself slightly in the wheelchair. With cough she had light gray sputum production. Heart sounds are distant. She has trace pedal edema. Posterior tibial pulses are decreased.   General appearance:Adequately nourished; no acute distress , increased work of breathing is present.   Lymphatic: No lymphadenopathy about the head, neck, axilla . Eyes: No conjunctival inflammation or lid edema is present. There is no scleral icterus. Ears:  External ear exam shows no significant lesions or deformities.   Nose:  External nasal examination shows no deformity or inflammation. Nasal mucosa are pink and moist without lesions ,exudates Oral exam: lips and gums are healthy appearing.There is no oropharyngeal erythema or exudate . Neck:  No thyromegaly, masses, tenderness noted.    Heart:  Normal rate and regular rhythm. S1 and S2 normal without gallop, murmur, click, rub .  Abdomen:Bowel sounds are normal. Abdomen is soft and nontender with no organomegaly, hernias,masses. GU: deferred  Extremities:  No cyanosis  Neurologic exam : Strength equal  in upper & lower extremities Balance,Rhomberg,finger to nose testing could not be completed due to clinical state Skin: Warm & dry w/o tenting. No  significant lesions or rash.    See summary under each active problem in the Problem List with associated updated therapeutic plan

## 2016-09-22 NOTE — Assessment & Plan Note (Signed)
Edema has resolved as has the vascular congestion on chest x-ray as per report. Sodium restriction was stressed with her. Furosemide will be continued as a maintenance agent.

## 2016-09-22 NOTE — Patient Instructions (Signed)
See Current Assessment & Plan in Problem List under specific Diagnosis  Avoid ingestion of  excess salt/sodium. Use the salt substitute "No Salt" OR the Mrs Deliah Boston products to season food @ the table. Avoid foods which taste salty or "vinegary" as their sodium content will be high.

## 2016-10-03 ENCOUNTER — Encounter: Payer: Self-pay | Admitting: Internal Medicine

## 2016-10-03 DIAGNOSIS — F339 Major depressive disorder, recurrent, unspecified: Secondary | ICD-10-CM | POA: Insufficient documentation

## 2016-10-03 HISTORY — DX: Major depressive disorder, recurrent, unspecified: F33.9

## 2016-10-04 ENCOUNTER — Encounter: Payer: Self-pay | Admitting: Adult Health

## 2016-10-04 ENCOUNTER — Ambulatory Visit (INDEPENDENT_AMBULATORY_CARE_PROVIDER_SITE_OTHER): Payer: Medicare Other | Admitting: Adult Health

## 2016-10-04 DIAGNOSIS — I5032 Chronic diastolic (congestive) heart failure: Secondary | ICD-10-CM | POA: Diagnosis not present

## 2016-10-04 DIAGNOSIS — J449 Chronic obstructive pulmonary disease, unspecified: Secondary | ICD-10-CM | POA: Diagnosis not present

## 2016-10-04 DIAGNOSIS — I10 Essential (primary) hypertension: Secondary | ICD-10-CM | POA: Diagnosis not present

## 2016-10-04 MED ORDER — ARFORMOTEROL TARTRATE 15 MCG/2ML IN NEBU: 15.0000 ug | INHALATION_SOLUTION | Freq: Two times a day (BID) | RESPIRATORY_TRACT | 5 refills | Status: AC

## 2016-10-04 MED ORDER — VALSARTAN 40 MG PO TABS: 40.0000 mg | ORAL_TABLET | Freq: Every day | ORAL | 5 refills | Status: DC

## 2016-10-04 NOTE — Assessment & Plan Note (Signed)
Recent exacerbation now resolving  Cont on lasix and low salt diet

## 2016-10-04 NOTE — Assessment & Plan Note (Signed)
On going cough with recent COPD flare  Will need to change to low dose ARB to see if this helps her cough as ACE may be aggravating cough  Change to Diovan 40mg  .  Follow up with PCP for HTN control

## 2016-10-04 NOTE — Progress Notes (Signed)
Subjective:    Patient ID: Dawn Montoya, female    DOB: April 14, 1932, 82 y.o.   MRN: [id number]  HPI 82 yo female followed for GOLD D COPD , DCHF , chronic respiratory failure on O2 She is a SNF resident    10/04/2016 Follow up : Post hospital COPD [facility]  Patient presents for a post hospital follow-up. Patient was admitted last month for a COPD exacerbation and decompensated chronic diastolic heart failure. She was treated with IV antibiotics, steroids, nebulized bronchodilators and diuresis. Hospital records from [facility] were reviewed in detail Patient was discharged on Pulmicort nebulizer and Advair. She was also discharged on prednisone 40 mg daily.  She says she is feeling better with less dyspnea .   She still has a dry nagging cough . She is on ACE inhibitor to HTN.  Denies fever, chest pain  Orthopnea or discolored mucus.   She is o2 w/ good sats.   Has DCHF . Says legs swelling is doing some better.   Past Medical History:  Diagnosis Date  . Anxiety state 02/25/2015  . Breast CA ([place])   . CKD (chronic kidney disease) stage 3, GFR 30-59 ml/min 12/15/2014  . COPD (chronic obstructive pulmonary disease) ([place])   . Diastolic CHF ([place]) 99991111  . GERD without esophagitis 01/19/2016  . Hordeolum externum (stye)    07/01/2016  . HTN (hypertension) 01/18/2014  . Hypertension   . Hypothyroidism 01/18/2014  . Pneumonia   . Recurrent major depression ([place]) 10/03/2016  . Spondylosis, cervical, with myelopathy 04/11/2016   Current Outpatient Prescriptions on File Prior to Visit  Medication Sig Dispense Refill  . acetaminophen (TYLENOL) 325 MG tablet Take 650 mg by mouth every 6 (six) hours as needed for mild pain. Reported on 04/11/2016    . albuterol (PROVENTIL HFA;VENTOLIN HFA) 108 (90 Base) MCG/ACT inhaler Inhale 2 puffs into the lungs every 6 (six) hours as needed for wheezing or shortness of breath.    . budesonide (PULMICORT) 0.25 MG/2ML nebulizer solution Take 0.25 mg  by nebulization 2 (two) times daily.    . cholecalciferol (VITAMIN D) 1000 UNITS tablet Take 1,000 Units by mouth daily. Reported on 04/11/2016    . Dextromethorphan-Guaifenesin (MUCINEX DM MAXIMUM STRENGTH) 60-1200 MG TB12 Take 1 capsule by mouth 2 (two) times daily. Reported on 04/11/2016    . DULoxetine (CYMBALTA) 60 MG capsule Take 1 capsule (60 mg total) by mouth daily.    . furosemide (LASIX) 40 MG tablet Take 40 mg by mouth daily.    Marland Kitchen gabapentin (NEURONTIN) 300 MG capsule Take 300 mg by mouth 3 (three) times daily.     . Hypromellose (ARTIFICIAL TEARS OP) Apply 1 drop to eye 4 (four) times daily as needed (Both Eyes).    Marland Kitchen levothyroxine (SYNTHROID, LEVOTHROID) 75 MCG tablet Take 75 mcg by mouth daily before breakfast.    . LORazepam (ATIVAN) 0.5 MG tablet Take one tablet by mouth every 8 hours as needed for anxiety 10 tablet 0  . Melatonin 3 MG TABS Take 3 mg by mouth at bedtime.     . nitroGLYCERIN (NITROSTAT) 0.4 MG SL tablet Place 0.4 mg under the tongue every 5 (five) minutes as needed for chest pain. 3 DOSES MAX    . oxyCODONE (OXY IR/ROXICODONE) 5 MG immediate release tablet Take one tablet by mouth every 4 hours as needed for pain (Patient taking differently: Take one tablet by mouth every 6 hours as needed for pain) 120 tablet 0  .  OXYGEN Inhale into the lungs. Nasal O2 3L/min for hypoxia    . pantoprazole (PROTONIX) 40 MG tablet Take 1 tablet (40 mg total) by mouth daily at 12 noon. (Patient taking differently: Take one tablet by mouth daily at 6am) 30 tablet 0  . Sodium Phosphates (RA SALINE ENEMA RE) Place 1 Applicatorful rectally daily as needed (constipation).    Marland Kitchen tiotropium (SPIRIVA HANDIHALER) 18 MCG inhalation capsule Place 1 capsule (18 mcg total) into inhaler and inhale daily. 30 capsule 12   No current facility-administered medications on file prior to visit.      Review of Systems Constitutional:   No  weight loss, night sweats,  Fevers, chills, + fatigue, or   lassitude.  HEENT:   No headaches,  Difficulty swallowing,  Tooth/dental problems, or  Sore throat,                No sneezing, itching, ear ache, nasal congestion, post nasal drip,   CV:  No chest pain,  Orthopnea, PND, swelling in lower extremities, anasarca, dizziness, palpitations, syncope.   GI  No heartburn, indigestion, abdominal pain, nausea, vomiting, diarrhea, change in bowel habits, loss of appetite, bloody stools.   Resp:  .  No excess mucus, no productive cough,  No non-productive cough,  No coughing up of blood.  No change in color of mucus.  No wheezing.  No chest wall deformity  Skin: no rash or lesions.  GU: no dysuria, change in color of urine, no urgency or frequency.  No flank pain, no hematuria   MS:  gen weakness, trouble walking   Psych:  No change in mood or affect. No depression or anxiety.  No memory loss.         Objective:   Physical Exam Vitals:   10/04/16 1216  BP: 112/60  Pulse: 63  Temp: 97.6 F (36.4 C)  TempSrc: Oral  SpO2: 94%  Weight: 250 lb 3.2 oz (113.5 kg)  Height: 5' 3.5" (1.613 m)   GEN: A/Ox3; pleasant , NAD, obese in wc    HEENT:  [place]/AT,  EACs-clear, TMs-wnl, NOSE-clear, THROAT-clear, no lesions, no postnasal drip or exudate noted.   NECK:  Supple w/ fair ROM; no JVD; normal carotid impulses w/o bruits; no thyromegaly or nodules palpated; no lymphadenopathy.    RESP  Clear  P & A; w/o, wheezes/ rales/ or rhonchi. no accessory muscle use, no dullness to percussion  CARD:  RRR, no m/r/g  , n\tr  peripheral edema, pulses intact, no cyanosis or clubbing.  GI:   Soft & nt; nml bowel sounds; no organomegaly or masses detected.   Musco: Warm bil, no deformities or joint swelling noted.   Neuro: alert, no focal deficits noted.    Skin: Warm, no lesions or rashes  Makailee Nudelman NP-C  [facility]  10/04/2016        Assessment & Plan:

## 2016-10-04 NOTE — Assessment & Plan Note (Signed)
Recent exacerbation now improving  Discharged for ~2 weeks of prednisone 40mg   She was not on steroids prior to admission -will slowly taper to off.  She is now on Advair and Pulmicort - which she likes the pulmicort . Will change her to neb pulmicort and brovana and get rid of advair.  Cont on spiriva   Plan  Patient Instructions  Stop Advair  Begin Brovana Neb Twice daily  .  Continue on Budesonide Neb Twice daily  .  Continue on Spiriva daily .  Decrease prednisone 30mg  daily for 5 days then 20mg  daily for 5 days then 10mg  daily for 5 days then stop.  Stop Lisinopril .  Begin Diovan 40mg  daily  Follow up with Dr. [place] Bells in 6 weeks and As needed    Follow up with primary MD for blood pressure.

## 2016-10-04 NOTE — Assessment & Plan Note (Signed)
HTN w/ D CHF w/ ongoing cough    On going cough with recent COPD flare  Will need to change to low dose ARB to see if this helps her cough as ACE may be aggravating cough  Change to Diovan 40mg  .  Follow up with PCP for HTN control

## 2016-10-04 NOTE — Patient Instructions (Addendum)
Stop Advair  Begin [employer] Twice daily  .  Continue on Budesonide Neb Twice daily  .  Continue on Spiriva daily .  Decrease prednisone 30mg  daily for 5 days then 20mg  daily for 5 days then 10mg  daily for 5 days then stop.  Stop Lisinopril .  Begin Diovan 40mg  daily  Follow up with Dr. Lake Bells in 6 weeks and As needed    Follow up with primary MD for blood pressure.

## 2016-10-07 NOTE — Progress Notes (Signed)
Reviewed, agree 

## 2016-10-19 ENCOUNTER — Encounter: Payer: Self-pay | Admitting: Nurse Practitioner

## 2016-10-19 ENCOUNTER — Non-Acute Institutional Stay (SKILLED_NURSING_FACILITY): Payer: Medicare Other | Admitting: Nurse Practitioner

## 2016-10-19 DIAGNOSIS — G629 Polyneuropathy, unspecified: Secondary | ICD-10-CM | POA: Diagnosis not present

## 2016-10-19 DIAGNOSIS — M1990 Unspecified osteoarthritis, unspecified site: Secondary | ICD-10-CM

## 2016-10-19 DIAGNOSIS — J449 Chronic obstructive pulmonary disease, unspecified: Secondary | ICD-10-CM

## 2016-10-19 DIAGNOSIS — I5032 Chronic diastolic (congestive) heart failure: Secondary | ICD-10-CM

## 2016-10-19 DIAGNOSIS — I959 Hypotension, unspecified: Secondary | ICD-10-CM | POA: Diagnosis not present

## 2016-10-19 NOTE — Progress Notes (Signed)
Nursing Home Location:  [facility]  Place of Service: SNF (31)  PCP: Unice Cobble, MD  No Known Allergies  Chief Complaint  Patient presents with  . Medical Management of Chronic Issues    Routine Visit    HPI:  Patient is a 82 y.o. female seen today at [facility] for routine follow up. Pt with past medical history of COPD, HTN, hypothyroidism, CHF, and CKD. Pt was hospitalized in November due to COPD exacerbation with acute on chronic CHF. Pt was diuresed for CHF and started on prednisone with taper and azithromycin for COPD exacerbation which has now improved. Pt reports shortness of breath at baseline. Working with therapy at this time. No wheezing.  Pt was followed by pulmonary since hospitalization. ACE changed  To ARB due to ongoing cough, this has not helped significantly. Pt conts on mucinex DM BID as well.  Pt reports ongoing pain in her legs, which is chronic. Taking tylenol 325 mg 2 tablets every morning which helps the pain significantly. Nursing asking for this to be scheduled so it will not be missed since it helps with pain.  Marland Kitchen  6CIT Screen 08/26/2016  What Year? 0 points  What month? 0 points  What time? 0 points  Count back from 20 0 points  Months in reverse 0 points  Repeat phrase 4 points  Total Score 4    Review of Systems:  Review of Systems  Constitutional: Negative for chills, fatigue and fever.  HENT: Negative for congestion, postnasal drip and rhinorrhea.   Eyes:       Dry eyes  Respiratory: Negative for cough, shortness of breath and wheezing.   Cardiovascular: Negative for chest pain, palpitations and leg swelling.  Gastrointestinal: Negative for abdominal pain, constipation and diarrhea.  Genitourinary: Positive for frequency (due to diuretic ). Negative for dysuria, flank pain and hematuria.  Musculoskeletal: Negative for arthralgias, back pain and myalgias.  Skin: Negative for wound.  Neurological: Positive for numbness (to  bilateral upper extermities ). Negative for dizziness and light-headedness.  Psychiatric/Behavioral: Negative for confusion.    Past Medical History:  Diagnosis Date  . Anxiety state 02/25/2015  . Breast CA ([place])   . CKD (chronic kidney disease) stage 3, GFR 30-59 ml/min 12/15/2014  . COPD (chronic obstructive pulmonary disease) ([place])   . Diastolic CHF ([place]) 99991111  . GERD without esophagitis 01/19/2016  . Hordeolum externum (stye)    07/01/2016  . HTN (hypertension) 01/18/2014  . Hypertension   . Hypothyroidism 01/18/2014  . Pneumonia   . Recurrent major depression ([place]) 10/03/2016  . Spondylosis, cervical, with myelopathy 04/11/2016   Past Surgical History:  Procedure Laterality Date  . BREAST SURGERY    . CHOLECYSTECTOMY    . COLONOSCOPY N/A 01/20/2014   Procedure: COLONOSCOPY;  Surgeon: Winfield Cunas., MD;  Location: [facility];  Service: Endoscopy;  Laterality: N/A;  . ESOPHAGOGASTRODUODENOSCOPY N/A 01/19/2014   Procedure: ESOPHAGOGASTRODUODENOSCOPY (EGD);  Surgeon: Jeryl Columbia, MD;  Location: [facility];  Service: Endoscopy;  Laterality: N/A;  . MASTECTOMY Left    Social History:   reports that she quit smoking about 16 years ago. Her smoking use included Cigarettes. She has a 100.00 pack-year smoking history. She has never used smokeless tobacco. She reports that she does not drink alcohol or use drugs.  Family History  Problem Relation Age of Onset  . Lung cancer Sister   . Breast cancer Sister   . Breast cancer Sister   .  Breast cancer Daughter   . Heart attack Neg Hx     Medications: Patient's Medications  New Prescriptions   No medications on file  Previous Medications   ACETAMINOPHEN (TYLENOL) 325 MG TABLET    Take 650 mg by mouth every 6 (six) hours as needed for mild pain. Reported on 04/11/2016   ALBUTEROL (PROVENTIL HFA;VENTOLIN HFA) 108 (90 BASE) MCG/ACT INHALER    Inhale 2 puffs into the lungs every 6 (six) hours as needed for wheezing or shortness  of breath.   ARFORMOTEROL (BROVANA) 15 MCG/2ML NEBU    Take 2 mLs (15 mcg total) by nebulization 2 (two) times daily.   BUDESONIDE (PULMICORT) 0.25 MG/2ML NEBULIZER SOLUTION    Take 0.25 mg by nebulization 2 (two) times daily.   CHOLECALCIFEROL (VITAMIN D) 1000 UNITS TABLET    Take 1,000 Units by mouth daily. Reported on 04/11/2016   DEXTROMETHORPHAN-GUAIFENESIN (MUCINEX DM MAXIMUM STRENGTH) 60-1200 MG TB12    Take 1 capsule by mouth 2 (two) times daily. Reported on 04/11/2016   DULOXETINE (CYMBALTA) 60 MG CAPSULE    Take 1 capsule (60 mg total) by mouth daily.   FUROSEMIDE (LASIX) 40 MG TABLET    Take 40 mg by mouth daily.   GABAPENTIN (NEURONTIN) 300 MG CAPSULE    Take 300 mg by mouth 3 (three) times daily.    HYPROMELLOSE (ARTIFICIAL TEARS OP)    Apply 1 drop to eye 4 (four) times daily as needed (Both Eyes).   LEVOTHYROXINE (SYNTHROID, LEVOTHROID) 75 MCG TABLET    Take 75 mcg by mouth daily before breakfast.   MELATONIN 3 MG TABS    Take 3 mg by mouth at bedtime.    NITROGLYCERIN (NITROSTAT) 0.4 MG SL TABLET    Place 0.4 mg under the tongue every 5 (five) minutes as needed for chest pain. 3 DOSES MAX   OXYCODONE (OXY IR/ROXICODONE) 5 MG IMMEDIATE RELEASE TABLET    Take 5 mg by mouth every 6 (six) hours as needed for severe pain.   OXYGEN    Inhale into the lungs. Nasal O2 3L/min for hypoxia   PANTOPRAZOLE (PROTONIX) 40 MG TABLET    Take 40 mg by mouth daily. At 6 PM   SODIUM PHOSPHATES (RA SALINE ENEMA RE)    Place 1 Applicatorful rectally daily as needed (constipation).   TIOTROPIUM (SPIRIVA HANDIHALER) 18 MCG INHALATION CAPSULE    Place 1 capsule (18 mcg total) into inhaler and inhale daily.   VALSARTAN (DIOVAN) 40 MG TABLET    Take 1 tablet (40 mg total) by mouth daily.  Modified Medications   No medications on file  Discontinued Medications   LORAZEPAM (ATIVAN) 0.5 MG TABLET    Take one tablet by mouth every 8 hours as needed for anxiety   OXYCODONE (OXY IR/ROXICODONE) 5 MG IMMEDIATE  RELEASE TABLET    Take one tablet by mouth every 4 hours as needed for pain   PANTOPRAZOLE (PROTONIX) 40 MG TABLET    Take 1 tablet (40 mg total) by mouth daily at 12 noon.   PREDNISONE (DELTASONE) 20 MG TABLET    Take 40 mg by mouth daily with breakfast.     Physical Exam: Vitals:   10/19/16 0952  BP: 126/71  Pulse: 96  Resp: 19  Temp: 97.9 F (36.6 C)  SpO2: 98%  Weight: 250 lb (113.4 kg)  Height: 5\' 3"  (1.6 m)    Physical Exam  Constitutional: She is oriented to person, place, and time. She appears well-developed and  well-nourished. No distress.  HENT:  Head: Normocephalic and atraumatic.  Mouth/Throat: Oropharynx is clear and moist. No oropharyngeal exudate.  Eyes: Pupils are equal, round, and reactive to light.  Neck: Normal range of motion. Neck supple.  Cardiovascular: Normal rate, regular rhythm and normal heart sounds.   No murmur heard. Pulmonary/Chest: Effort normal. She has wheezes (right lower lobe).  Diminished throughout   Abdominal: Soft. Bowel sounds are normal. She exhibits no distension.  Musculoskeletal: She exhibits edema (+1 bilaterally).  Lymphadenopathy:    She has no cervical adenopathy.  Neurological: She is alert and oriented to person, place, and time.  Tremor   Skin: Skin is warm and dry. She is not diaphoretic.  Psychiatric: She has a normal mood and affect.    Labs reviewed: Basic Metabolic Panel:  Recent Labs  09/18/16 0250 09/18/16 0551 09/19/16 0648 09/19/16 0845 09/20/16 0525  NA 141  --  142  --  139  K 4.0  --  4.2  --  3.5  CL 100*  --  98*  --  93*  CO2 32  --  33*  --  34*  GLUCOSE 130*  --  109*  --  97  BUN 14  --  16  --  25*  CREATININE 0.81 0.83 0.78  --  0.98  CALCIUM 8.8*  --  9.4  --  9.3  MG  --   --   --  2.2 2.1  PHOS  --   --   --  3.8 4.1   Liver Function Tests:  Recent Labs  05/15/16 09/19/16 0648 09/20/16 0525  AST 15 21 19   ALT 14 18 19   ALKPHOS 52 64 63  BILITOT  --  0.4 0.7  PROT  --  7.0  7.0  ALBUMIN  --  3.5 3.8   No results for input(s): LIPASE, AMYLASE in the last 8760 hours. No results for input(s): AMMONIA in the last 8760 hours. CBC:  Recent Labs  08/18/16  09/18/16 0551 09/19/16 0648 09/20/16 0525  WBC 9.0  < > 8.7 8.5 11.2*  NEUTROABS 6,480  --   --   --  8.6*  HGB 10.9*  < > 11.3* 12.5 12.7  HCT 34*  < > 36.5 40.4 40.5  MCV  --   < > 98.4 97.1 96.4  PLT 240  < > 258 268 340  < > = values in this interval not displayed. TSH:  Recent Labs  11/02/15 11/10/15  TSH 3.14 4.26   A1C: Lab Results  Component Value Date   HGBA1C 5.6 08/18/2016   Lipid Panel:  Recent Labs  12/08/15  CHOL 160  HDL 32*  LDLCALC 92  TRIG 181*   IMPRESSION:  Abnormal MRI cervical spine (without) demonstrating: 1. At C6-7: disc bulging and facet hypertrophy with moderate-severe spinal stenosis, moderate right and severe left foraminal stenosis  2. At C5-6: disc bulging and facet hypertrophy with moderate-severe spinal stenosis and moderate biforaminal stenosis  3. At C4-5: uncovertebral joint hypertrophy with mild biforaminal stenosis  4. At C3-4: uncovertebral joint hypertrophy with mild left foraminal stenosis  5. Patient motion artifact limits this study. Consider repeat scan with sedation if indicated.  Assessment/Plan 1. Chronic obstructive pulmonary disease, unspecified COPD type ([place]) Exacerbation has improved but conts with productive cough. conts on mucinex DM by mouth twice daily routine. Following with pulmonary, brovana added and advair stopped, pt conts on spiriva and budesonide BID   2. Diastolic CHF, chronic (HCC)  Euvolemic, conts on lasix 40 mg daily, conts on ARB. Pt not currently on betablocker but blood pressure and pulse appear they could tolerate adding Will start low dose coreg 3.125 mg by mouth twice daily and monitor  3. Neuropathy (HCC) Stable, conts on gabapentin scheduled  4. Osteoarthritis, unspecified osteoarthritis type,  unspecified site Will schedule tylenol 325 mg 2 tablets BID for better pain control.   5. Hypertension Remains stable. Adding coreg due to CHF.    Carlos American. Harle Battiest  [facility] [phone] 8 am - 5 pm) [phone] (after hours)

## 2016-11-16 ENCOUNTER — Non-Acute Institutional Stay (SKILLED_NURSING_FACILITY): Payer: Medicare Other | Admitting: Nurse Practitioner

## 2016-11-16 ENCOUNTER — Encounter: Payer: Self-pay | Admitting: Nurse Practitioner

## 2016-11-16 ENCOUNTER — Telehealth: Payer: Self-pay | Admitting: Internal Medicine

## 2016-11-16 DIAGNOSIS — J441 Chronic obstructive pulmonary disease with (acute) exacerbation: Secondary | ICD-10-CM

## 2016-11-16 NOTE — Telephone Encounter (Signed)
Opened in error

## 2016-11-16 NOTE — Progress Notes (Signed)
Nursing Home Location:  [facility]  Place of Service: SNF (31)  PCP: Unice Cobble, MD  No Known Allergies  Chief Complaint  Patient presents with  . Acute Visit    HPI:  Patient is a 83 y.o. female seen today at [facility] for due to cough, congestion and increase sputum production. Pt with past medical history of COPD, HTN, hypothyroidism, CHF, and CKD. Pt was hospitalized in November with COPD exacerbation and CHF exacerbation. She was doing better in December but for the last 3 weeks have has worsening shortness of breath, cough congestion and sputum production with color change.  Pt has stayed in the bed for most of the week. No increase in edema noted   Review of Systems:  Review of Systems  Constitutional: Positive for fever (when she started feeling bad). Negative for chills and fatigue.  HENT: Negative for congestion, postnasal drip and rhinorrhea.   Eyes:       Dry eyes  Respiratory: Positive for cough, chest tightness, shortness of breath and wheezing.   Cardiovascular: Negative for chest pain, palpitations and leg swelling.  Gastrointestinal: Negative for abdominal pain, constipation and diarrhea.  Genitourinary: Positive for frequency (due to diuretic ). Negative for dysuria, flank pain and hematuria.  Skin: Negative for wound.  Neurological: Positive for numbness (to bilateral upper extermities ). Negative for dizziness and light-headedness.    Past Medical History:  Diagnosis Date  . Anxiety state 02/25/2015  . Breast CA ([place])   . CKD (chronic kidney disease) stage 3, GFR 30-59 ml/min 12/15/2014  . COPD (chronic obstructive pulmonary disease) ([place])   . Diastolic CHF ([place]) 99991111  . GERD without esophagitis 01/19/2016  . Hordeolum externum (stye)    07/01/2016  . HTN (hypertension) 01/18/2014  . Hypertension   . Hypothyroidism 01/18/2014  . Pneumonia   . Recurrent major depression ([place]) 10/03/2016  . Spondylosis, cervical, with myelopathy  04/11/2016   Past Surgical History:  Procedure Laterality Date  . BREAST SURGERY    . CHOLECYSTECTOMY    . COLONOSCOPY N/A 01/20/2014   Procedure: COLONOSCOPY;  Surgeon: Winfield Cunas., MD;  Location: [facility];  Service: Endoscopy;  Laterality: N/A;  . ESOPHAGOGASTRODUODENOSCOPY N/A 01/19/2014   Procedure: ESOPHAGOGASTRODUODENOSCOPY (EGD);  Surgeon: Jeryl Columbia, MD;  Location: [facility];  Service: Endoscopy;  Laterality: N/A;  . MASTECTOMY Left    Social History:   reports that she quit smoking about 17 years ago. Her smoking use included Cigarettes. She has a 100.00 pack-year smoking history. She has never used smokeless tobacco. She reports that she does not drink alcohol or use drugs.  Family History  Problem Relation Age of Onset  . Lung cancer Sister   . Breast cancer Sister   . Breast cancer Sister   . Breast cancer Daughter   . Heart attack Neg Hx     Medications: Patient's Medications  New Prescriptions   No medications on file  Previous Medications   ACETAMINOPHEN (TYLENOL) 325 MG TABLET    Take 650 mg by mouth every 12 (twelve) hours. Scheduled.   ALBUTEROL (PROVENTIL HFA;VENTOLIN HFA) 108 (90 BASE) MCG/ACT INHALER    Inhale 2 puffs into the lungs every 6 (six) hours as needed for wheezing or shortness of breath.   ARFORMOTEROL (BROVANA) 15 MCG/2ML NEBU    Take 2 mLs (15 mcg total) by nebulization 2 (two) times daily.   BUDESONIDE (PULMICORT) 0.25 MG/2ML NEBULIZER SOLUTION    Take 0.25 mg by  nebulization 2 (two) times daily.   CARVEDILOL (COREG) 3.125 MG TABLET    Take 3.125 mg by mouth 2 (two) times daily with a meal. Hold for HR less than 60 and BP less than 100.   CHOLECALCIFEROL (VITAMIN D) 1000 UNITS TABLET    Take 1,000 Units by mouth daily. Reported on 04/11/2016   DEXTROMETHORPHAN-GUAIFENESIN (MUCINEX DM MAXIMUM STRENGTH) 60-1200 MG TB12    Take 1 capsule by mouth 2 (two) times daily. Reported on 04/11/2016   DULOXETINE (CYMBALTA) 60 MG CAPSULE    Take 1  capsule (60 mg total) by mouth daily.   FUROSEMIDE (LASIX) 40 MG TABLET    Take 40 mg by mouth daily.   GABAPENTIN (NEURONTIN) 300 MG CAPSULE    Take 300 mg by mouth 3 (three) times daily.    HYPROMELLOSE (ARTIFICIAL TEARS OP)    Apply 1 drop to eye 4 (four) times daily as needed (Both Eyes).   LEVOTHYROXINE (SYNTHROID, LEVOTHROID) 75 MCG TABLET    Take 75 mcg by mouth daily before breakfast.   MELATONIN 3 MG TABS    Take 3 mg by mouth at bedtime.    NITROGLYCERIN (NITROSTAT) 0.4 MG SL TABLET    Place 0.4 mg under the tongue every 5 (five) minutes as needed for chest pain. 3 DOSES MAX   OXYCODONE (OXY IR/ROXICODONE) 5 MG IMMEDIATE RELEASE TABLET    Take 5 mg by mouth every 6 (six) hours as needed for severe pain.   OXYGEN    Inhale into the lungs. Nasal O2 3L/min for hypoxia   PANTOPRAZOLE (PROTONIX) 40 MG TABLET    Take 40 mg by mouth daily. At 6 PM   SODIUM PHOSPHATES (RA SALINE ENEMA RE)    Place 1 Applicatorful rectally daily as needed (constipation).   TIOTROPIUM (SPIRIVA HANDIHALER) 18 MCG INHALATION CAPSULE    Place 1 capsule (18 mcg total) into inhaler and inhale daily.   VALSARTAN (DIOVAN) 40 MG TABLET    Take 1 tablet (40 mg total) by mouth daily.  Modified Medications   No medications on file  Discontinued Medications   No medications on file     Physical Exam: Vitals:   11/16/16 1013  BP: 135/83  Pulse: 72  Resp: 20  Temp: 98 F (36.7 C)  SpO2: 90%  Weight: 252 lb 9.6 oz (114.6 kg)  Height: 5\' 3"  (1.6 m)    Physical Exam  Constitutional: She is oriented to person, place, and time. She appears well-developed and well-nourished. No distress.  HENT:  Head: Normocephalic and atraumatic.  Mouth/Throat: Oropharynx is clear and moist. No oropharyngeal exudate.  Eyes: Pupils are equal, round, and reactive to light.  Neck: Normal range of motion. Neck supple.  Cardiovascular: Normal rate, regular rhythm and normal heart sounds.   No murmur heard. Pulmonary/Chest: Effort  normal. No respiratory distress. She has no wheezes.  Diminished throughout   Abdominal: Soft. Bowel sounds are normal. She exhibits no distension.  Musculoskeletal: She exhibits edema (+1 bilaterally).  Lymphadenopathy:    She has no cervical adenopathy.  Neurological: She is alert and oriented to person, place, and time.  Tremor   Skin: Skin is warm and dry. She is not diaphoretic.  Psychiatric: She has a normal mood and affect.    Labs reviewed: Basic Metabolic Panel:  Recent Labs  09/18/16 0250 09/18/16 0551 09/19/16 0648 09/19/16 0845 09/20/16 0525  NA 141  --  142  --  139  K 4.0  --  4.2  --  3.5  CL 100*  --  98*  --  93*  CO2 32  --  33*  --  34*  GLUCOSE 130*  --  109*  --  97  BUN 14  --  16  --  25*  CREATININE 0.81 0.83 0.78  --  0.98  CALCIUM 8.8*  --  9.4  --  9.3  MG  --   --   --  2.2 2.1  PHOS  --   --   --  3.8 4.1   Liver Function Tests:  Recent Labs  05/15/16 09/19/16 0648 09/20/16 0525  AST 15 21 19   ALT 14 18 19   ALKPHOS 52 64 63  BILITOT  --  0.4 0.7  PROT  --  7.0 7.0  ALBUMIN  --  3.5 3.8   No results for input(s): LIPASE, AMYLASE in the last 8760 hours. No results for input(s): AMMONIA in the last 8760 hours. CBC:  Recent Labs  08/18/16  09/18/16 0551 09/19/16 0648 09/20/16 0525  WBC 9.0  < > 8.7 8.5 11.2*  NEUTROABS 6,480  --   --   --  8.6*  HGB 10.9*  < > 11.3* 12.5 12.7  HCT 34*  < > 36.5 40.4 40.5  MCV  --   < > 98.4 97.1 96.4  PLT 240  < > 258 268 340  < > = values in this interval not displayed. TSH: No results for input(s): TSH in the last 8760 hours. A1C: Lab Results  Component Value Date   HGBA1C 5.6 08/18/2016   Lipid Panel:  Recent Labs  12/08/15  CHOL 160  HDL 32*  LDLCALC 92  TRIG 181*   IMPRESSION:  Abnormal MRI cervical spine (without) demonstrating: 1. At C6-7: disc bulging and facet hypertrophy with moderate-severe spinal stenosis, moderate right and severe left foraminal stenosis  2. At  C5-6: disc bulging and facet hypertrophy with moderate-severe spinal stenosis and moderate biforaminal stenosis  3. At C4-5: uncovertebral joint hypertrophy with mild biforaminal stenosis  4. At C3-4: uncovertebral joint hypertrophy with mild left foraminal stenosis  5. Patient motion artifact limits this study. Consider repeat scan with sedation if indicated.  Assessment/Plan 1. COPD exacerbation (HCC) Ongoing shortness of breath, chest congestion with increased sputum production.  Pt was on azithromycin after hospitalization in November for COPD exacerbation and was doing better however now with increase symptoms. Staff reports increase wheezing at times.  Will start avelox 400 mg daily for 5 days with duonebs TID x 5 days.  Staff to monitor and notify of changes.  VS q shift, cont on chronic O2 Dawn Montoya K. Harle Battiest  [facility] [phone] 8 am - 5 pm) [phone] (after hours)

## 2016-11-24 ENCOUNTER — Ambulatory Visit (INDEPENDENT_AMBULATORY_CARE_PROVIDER_SITE_OTHER): Payer: Medicare Other | Admitting: Pulmonary Disease

## 2016-11-24 ENCOUNTER — Ambulatory Visit (INDEPENDENT_AMBULATORY_CARE_PROVIDER_SITE_OTHER)
Admission: RE | Admit: 2016-11-24 | Discharge: 2016-11-24 | Disposition: A | Discharge status: Home / Self Care | Payer: Medicare Other | Source: Ambulatory Visit | Attending: Pulmonary Disease | Admitting: Pulmonary Disease

## 2016-11-24 ENCOUNTER — Encounter: Payer: Self-pay | Admitting: Pulmonary Disease

## 2016-11-24 VITALS — BP 108/68 | HR 82 | Ht 63.0 in | Wt 253.0 lb

## 2016-11-24 DIAGNOSIS — J9621 Acute and chronic respiratory failure with hypoxia: Secondary | ICD-10-CM

## 2016-11-24 DIAGNOSIS — J449 Chronic obstructive pulmonary disease, unspecified: Secondary | ICD-10-CM

## 2016-11-24 DIAGNOSIS — R05 Cough: Secondary | ICD-10-CM

## 2016-11-24 DIAGNOSIS — R059 Cough, unspecified: Secondary | ICD-10-CM

## 2016-11-24 MED ORDER — LEVOFLOXACIN 750 MG PO TABS: 750.0000 mg | ORAL_TABLET | Freq: Every day | ORAL | 0 refills | Status: DC

## 2016-11-24 MED ORDER — PREDNISONE 10 MG PO TABS: ORAL_TABLET | ORAL | 0 refills | Status: DC

## 2016-11-24 NOTE — Assessment & Plan Note (Signed)
She has severe COPD which is quite advanced and she is unable to exert herself from profound deconditioning. Unfortunately right now she still suffering from an exacerbation of COPD. Though she doesn't feel very short of breath she has significant wheezing and very poor air movement on exam and she's continuing to produce purulent mucus.  Plan: Chest x-ray to rule out pneumonia Prednisone taper Levaquin 7 days Continue Brovana Pulmicort and Spiriva as prescribed Follow-up in 3-4 weeks to make sure things are getting better

## 2016-11-24 NOTE — Patient Instructions (Signed)
Keep taking your inhaled and nebulized medicines as you are doing Take the prednisone taper we prescribed Take the Levaquin we prescribed We will call you with the results of the chest x-ray Keep using your oxygen as you're doing We will have you come back and see our nurse practitioner in 3-4 weeks or sooner if needed

## 2016-11-24 NOTE — Progress Notes (Signed)
Subjective:    Patient ID: Dawn Montoya, female    DOB: December 18, 1931, 83 y.o.   MRN: [id number]  Synopsis: Dawn Montoya has gold grade D COPD and was hospitalized in January 2016 for a COPD exacerbation which was complicated by diastolic heart failure. She was sent to a skilled nursing facility where she continues to participate in rehabilitation on a daily basis.  HPI Chief Complaint  Patient presents with  . Follow-up    6 week f/u. Pt. states she has been feeling ok since last visit. Still has a slight cough.    Rada says that she was diagnosed with pneumonia a couple of weeks ago.  She had a cough with mucus production and she was treated with avelox recently. She says the antibiotics help.  She is producing mucus every day right now.  It is typically white to yellow, sometimes it is "dirty", non-bloody.  No chest pain or dyspnea.  Sh is still coughing up a lot of mucus.   In regards to her COPD she says that she isn't exercising at all.  She says she still takes the nebulizer treatments and they help her breathe better, but have no effect on the cough.   She is not smoking.  She quit 65 years.  She has been on oxygen for more than a year she thinks.  It is set on 3Lpm.  Past Medical History:  Diagnosis Date  . Anxiety state 02/25/2015  . Breast CA ([place])   . CKD (chronic kidney disease) stage 3, GFR 30-59 ml/min 12/15/2014  . COPD (chronic obstructive pulmonary disease) ([place])   . Diastolic CHF ([place]) 99991111  . GERD without esophagitis 01/19/2016  . Hordeolum externum (stye)    07/01/2016  . HTN (hypertension) 01/18/2014  . Hypertension   . Hypothyroidism 01/18/2014  . Pneumonia   . Recurrent major depression ([place]) 10/03/2016  . Spondylosis, cervical, with myelopathy 04/11/2016      Review of Systems  Constitutional: Positive for fatigue. Negative for chills and fever.  HENT: Negative for postnasal drip, rhinorrhea and sinus pressure.   Respiratory: Positive for cough and  shortness of breath. Negative for wheezing.   Cardiovascular: Positive for leg swelling. Negative for chest pain and palpitations.       Objective:   Physical Exam  Vitals:   11/24/16 0932  BP: 108/68  Pulse: 82  SpO2: 95%  Weight: 253 lb (114.8 kg)  Height: 5\' 3"  (1.6 m)   3L  continuously  Gen: chronically ill appearing HENT: OP clear, TM's clear, neck supple PULM: Wheezing, poor air movement bilaterally, normal percussion CV: RRR, no mgr, trace edema GI: BS+, soft, nontender Derm: no cyanosis or rash Psyche: normal mood and affect   Records from her December visit with [place] in 2017 reviewed, she had recently had a COPD exacerbation. She was treated with prednisone for this. She also had her blood pressure medicines changed to an ARB instead of an ACE inhibitor.  Records form the SNF from 11/16/16 reviewed> treated for a COPD flare with avelox.  CBC    Component Value Date/Time   WBC 11.2 (H) 09/20/2016 0525   RBC 4.20 09/20/2016 0525   HGB 12.7 09/20/2016 0525   HCT 40.5 09/20/2016 0525   PLT 340 09/20/2016 0525   MCV 96.4 09/20/2016 0525   MCH 30.2 09/20/2016 0525   MCHC 31.4 09/20/2016 0525   RDW 14.1 09/20/2016 0525   LYMPHSABS 1.8 09/20/2016 0525   MONOABS 0.7 09/20/2016 0525  EOSABS 0.1 09/20/2016 0525   BASOSABS 0.0 09/20/2016 0525       Assessment & Plan:   COPD (chronic obstructive pulmonary disease) ([place]) She has severe COPD which is quite advanced and she is unable to exert herself from profound deconditioning. Unfortunately right now she still suffering from an exacerbation of COPD. Though she doesn't feel very short of breath she has significant wheezing and very poor air movement on exam and she's continuing to produce purulent mucus.  Plan: Chest x-ray to rule out pneumonia Prednisone taper Levaquin 7 days Continue Brovana Pulmicort and Spiriva as prescribed Follow-up in 3-4 weeks to make sure things are getting better  Acute on chronic  respiratory failure with hypoxemia ([place]) Continue 3 L of oxygen as prescribed   Updated Medication List Outpatient Encounter Prescriptions as of 11/24/2016  Medication Sig  . acetaminophen (TYLENOL) 325 MG tablet Take 650 mg by mouth every 12 (twelve) hours. Scheduled.  Marland Kitchen albuterol (PROVENTIL HFA;VENTOLIN HFA) 108 (90 Base) MCG/ACT inhaler Inhale 2 puffs into the lungs every 6 (six) hours as needed for wheezing or shortness of breath.  Marland Kitchen arformoterol (BROVANA) 15 MCG/2ML NEBU Take 2 mLs (15 mcg total) by nebulization 2 (two) times daily.  . budesonide (PULMICORT) 0.25 MG/2ML nebulizer solution Take 0.25 mg by nebulization 2 (two) times daily.  . carvedilol (COREG) 3.125 MG tablet Take 3.125 mg by mouth 2 (two) times daily with a meal. Hold for HR less than 60 and BP less than 100.  . cholecalciferol (VITAMIN D) 1000 UNITS tablet Take 1,000 Units by mouth daily. Reported on 04/11/2016  . Dextromethorphan-Guaifenesin (MUCINEX DM MAXIMUM STRENGTH) 60-1200 MG TB12 Take 1 capsule by mouth 2 (two) times daily. Reported on 04/11/2016  . DULoxetine (CYMBALTA) 60 MG capsule Take 1 capsule (60 mg total) by mouth daily.  . furosemide (LASIX) 40 MG tablet Take 40 mg by mouth daily.  Marland Kitchen gabapentin (NEURONTIN) 300 MG capsule Take 300 mg by mouth 3 (three) times daily.   . Hypromellose (ARTIFICIAL TEARS OP) Apply 1 drop to eye 4 (four) times daily as needed (Both Eyes).  Marland Kitchen levothyroxine (SYNTHROID, LEVOTHROID) 75 MCG tablet Take 75 mcg by mouth daily before breakfast.  . Melatonin 3 MG TABS Take 3 mg by mouth at bedtime.   . nitroGLYCERIN (NITROSTAT) 0.4 MG SL tablet Place 0.4 mg under the tongue every 5 (five) minutes as needed for chest pain. 3 DOSES MAX  . oxyCODONE (OXY IR/ROXICODONE) 5 MG immediate release tablet Take 5 mg by mouth every 6 (six) hours as needed for severe pain.  . OXYGEN Inhale into the lungs. Nasal O2 3L/min for hypoxia  . pantoprazole (PROTONIX) 40 MG tablet Take 40 mg by mouth daily. At 6  PM  . Sodium Phosphates (RA SALINE ENEMA RE) Place 1 Applicatorful rectally daily as needed (constipation).  Marland Kitchen tiotropium (SPIRIVA HANDIHALER) 18 MCG inhalation capsule Place 1 capsule (18 mcg total) into inhaler and inhale daily.  . valsartan (DIOVAN) 40 MG tablet Take 1 tablet (40 mg total) by mouth daily.  Marland Kitchen levofloxacin (LEVAQUIN) 750 MG tablet Take 1 tablet (750 mg total) by mouth daily.  . predniSONE (DELTASONE) 10 MG tablet Take 40mg  po daily for 3 days, then take 30mg  po daily for 3 days, then take 20mg  po daily for two days, then take 10mg  po daily for 2 days   No facility-administered encounter medications on file as of 11/24/2016.

## 2016-11-24 NOTE — Assessment & Plan Note (Signed)
Continue 3 L of oxygen as prescribed

## 2016-12-02 ENCOUNTER — Non-Acute Institutional Stay (SKILLED_NURSING_FACILITY): Payer: Medicare Other | Admitting: Nurse Practitioner

## 2016-12-02 DIAGNOSIS — I959 Hypotension, unspecified: Secondary | ICD-10-CM | POA: Diagnosis not present

## 2016-12-02 DIAGNOSIS — K0889 Other specified disorders of teeth and supporting structures: Secondary | ICD-10-CM | POA: Diagnosis not present

## 2016-12-02 DIAGNOSIS — R600 Localized edema: Secondary | ICD-10-CM | POA: Diagnosis not present

## 2016-12-02 DIAGNOSIS — J441 Chronic obstructive pulmonary disease with (acute) exacerbation: Secondary | ICD-10-CM | POA: Diagnosis not present

## 2016-12-02 DIAGNOSIS — I5032 Chronic diastolic (congestive) heart failure: Secondary | ICD-10-CM

## 2016-12-02 DIAGNOSIS — Z972 Presence of dental prosthetic device (complete) (partial): Secondary | ICD-10-CM | POA: Diagnosis not present

## 2016-12-02 NOTE — Progress Notes (Signed)
Nursing Home Location:  [facility]  Place of Service: SNF (31)  PCP: Unice Cobble, MD  No Known Allergies  Chief Complaint  Patient presents with  . Medical Management of Chronic Issues    Routine Visit    HPI:  Patient is a 83 y.o. female seen today at [facility] for due to routine follow up on chronic conditions. Pt with past medical history of COPD, HTN, hypothyroidism, CHF, and CKD. In the last month pt was seen for COPD exacerbation in facility and was treated with Avelox for 4 days with duonebs. She then went to pulmonary and was placed on Levaquin due to COPD exacerbation with ongoing purulent mucous. She also had prednisone taper.  She reports improvement of symptoms with no chest tightness, wheezing, or shortness of breath at rest. She has mild dyspnea on exertion.  She continues to have a productive cough with a small amount of white sputum.  She has completed her course of antibiotics.  She reports minimal increase in swelling in lower extremities, and states she has been compliant with her Lasix.  She also reports dental pain associated with loose fit of her lower dentures.  This pain is worsened with eating and has caused decreased intake.        Review of Systems:  Review of Systems  Constitutional: Negative for chills, fatigue and fever.  HENT: Positive for dental problem (loose dentures). Negative for congestion, mouth sores, postnasal drip, rhinorrhea and trouble swallowing.   Eyes:       Dry eyes  Respiratory: Positive for cough. Negative for chest tightness, shortness of breath and wheezing.   Cardiovascular: Positive for leg swelling. Negative for chest pain and palpitations.  Gastrointestinal: Negative for abdominal pain, constipation and diarrhea.  Genitourinary: Positive for frequency (due to diuretic). Negative for dysuria and flank pain.  Skin: Negative for wound.  Neurological: Positive for numbness (to bilateral upper extermities ).  Negative for dizziness, light-headedness and headaches.    Past Medical History:  Diagnosis Date  . Anxiety state 02/25/2015  . Breast CA ([place])   . CKD (chronic kidney disease) stage 3, GFR 30-59 ml/min 12/15/2014  . COPD (chronic obstructive pulmonary disease) ([place])   . Diastolic CHF ([place]) 99991111  . GERD without esophagitis 01/19/2016  . Hordeolum externum (stye)    07/01/2016  . HTN (hypertension) 01/18/2014  . Hypertension   . Hypothyroidism 01/18/2014  . Pneumonia   . Recurrent major depression ([place]) 10/03/2016  . Spondylosis, cervical, with myelopathy 04/11/2016   Past Surgical History:  Procedure Laterality Date  . BREAST SURGERY    . CHOLECYSTECTOMY    . COLONOSCOPY N/A 01/20/2014   Procedure: COLONOSCOPY;  Surgeon: Winfield Cunas., MD;  Location: [facility];  Service: Endoscopy;  Laterality: N/A;  . ESOPHAGOGASTRODUODENOSCOPY N/A 01/19/2014   Procedure: ESOPHAGOGASTRODUODENOSCOPY (EGD);  Surgeon: Jeryl Columbia, MD;  Location: [facility];  Service: Endoscopy;  Laterality: N/A;  . MASTECTOMY Left    Social History:   reports that she quit smoking about 17 years ago. Her smoking use included Cigarettes. She has a 100.00 pack-year smoking history. She has never used smokeless tobacco. She reports that she does not drink alcohol or use drugs.  Family History  Problem Relation Age of Onset  . Lung cancer Sister   . Breast cancer Sister   . Breast cancer Sister   . Breast cancer Daughter   . Heart attack Neg Hx     Medications: Patient's Medications  New Prescriptions   No medications on file  Previous Medications   ACETAMINOPHEN (TYLENOL) 325 MG TABLET    Take 650 mg by mouth every 12 (twelve) hours. Scheduled.   ALBUTEROL (PROVENTIL HFA;VENTOLIN HFA) 108 (90 BASE) MCG/ACT INHALER    Inhale 2 puffs into the lungs every 6 (six) hours as needed for wheezing or shortness of breath.   ARFORMOTEROL (BROVANA) 15 MCG/2ML NEBU    Take 2 mLs (15 mcg total) by nebulization 2  (two) times daily.   BUDESONIDE (PULMICORT) 0.25 MG/2ML NEBULIZER SOLUTION    Take 0.25 mg by nebulization 2 (two) times daily.   CARVEDILOL (COREG) 3.125 MG TABLET    Take 3.125 mg by mouth 2 (two) times daily with a meal. Hold for HR less than 60 and BP less than 100.   CHOLECALCIFEROL (VITAMIN D) 1000 UNITS TABLET    Take 1,000 Units by mouth daily. Reported on 04/11/2016   CYCLOSPORINE (RESTASIS) 0.05 % OPHTHALMIC EMULSION    Place 1 drop into both eyes 2 (two) times daily.   DEXTROMETHORPHAN-GUAIFENESIN (MUCINEX DM MAXIMUM STRENGTH) 60-1200 MG TB12    Take 1 capsule by mouth 2 (two) times daily. Reported on 04/11/2016   DULOXETINE (CYMBALTA) 60 MG CAPSULE    Take 1 capsule (60 mg total) by mouth daily.   FUROSEMIDE (LASIX) 40 MG TABLET    Take 40 mg by mouth daily.   GABAPENTIN (NEURONTIN) 300 MG CAPSULE    Take 300 mg by mouth 3 (three) times daily.    HYPROMELLOSE (ARTIFICIAL TEARS OP)    Apply 1 drop to eye 4 (four) times daily as needed (Both Eyes).   LEVOTHYROXINE (SYNTHROID, LEVOTHROID) 75 MCG TABLET    Take 75 mcg by mouth daily before breakfast.   MELATONIN 3 MG TABS    Take 3 mg by mouth at bedtime.    NITROGLYCERIN (NITROSTAT) 0.4 MG SL TABLET    Place 0.4 mg under the tongue every 5 (five) minutes as needed for chest pain. 3 DOSES MAX   OXYCODONE (OXY IR/ROXICODONE) 5 MG IMMEDIATE RELEASE TABLET    Take 5 mg by mouth every 6 (six) hours as needed for severe pain.   OXYGEN    Inhale into the lungs. Nasal O2 3L/min for hypoxia   PANTOPRAZOLE (PROTONIX) 40 MG TABLET    Take 40 mg by mouth daily. At 6 PM   PROPYLENE GLYCOL (SYSTANE BALANCE OP)    Place 1 drop into both eyes 2 (two) times daily.   SODIUM PHOSPHATES (RA SALINE ENEMA RE)    Place 1 Applicatorful rectally daily as needed (constipation).   TIOTROPIUM (SPIRIVA HANDIHALER) 18 MCG INHALATION CAPSULE    Place 1 capsule (18 mcg total) into inhaler and inhale daily.   VALSARTAN (DIOVAN) 40 MG TABLET    Take 1 tablet (40 mg total)  by mouth daily.  Modified Medications   No medications on file  Discontinued Medications   LEVOFLOXACIN (LEVAQUIN) 750 MG TABLET    Take 1 tablet (750 mg total) by mouth daily.   PREDNISONE (DELTASONE) 10 MG TABLET    Take 40mg  po daily for 3 days, then take 30mg  po daily for 3 days, then take 20mg  po daily for two days, then take 10mg  po daily for 2 days     Physical Exam: Vitals:   12/02/16 0940  BP: (!) 98/58  Pulse: 65  Resp: 17  Temp: 97.4 F (36.3 C)  SpO2: 94%  Weight: 253 lb (114.8 kg)  Height:  5\' 3"  (1.6 m)    Physical Exam  Constitutional: She is oriented to person, place, and time. She appears well-developed and well-nourished. No distress.  HENT:  Head: Normocephalic and atraumatic.  Mouth/Throat: Oropharynx is clear and moist. No oropharyngeal exudate.  Eyes: Pupils are equal, round, and reactive to light.  Neck: Normal range of motion. Neck supple.  Cardiovascular: Normal rate and regular rhythm.  Exam reveals no gallop.   No murmur heard. Pulses:      Radial pulses are 2+ on the right side, and 2+ on the left side.       Dorsalis pedis pulses are 2+ on the right side, and 2+ on the left side.  Pulmonary/Chest: Effort normal. No respiratory distress. She has decreased breath sounds in the right lower field and the left lower field. She has wheezes in the right upper field.  Diminished throughout   Abdominal: Soft. Bowel sounds are normal. She exhibits no distension.  Musculoskeletal: She exhibits edema (+1 bilaterally).  Lymphadenopathy:    She has no cervical adenopathy.  Neurological: She is alert and oriented to person, place, and time. No cranial nerve deficit.  Tremor   Skin: Skin is warm and dry. She is not diaphoretic.  Psychiatric: She has a normal mood and affect.    Labs reviewed: Basic Metabolic Panel:  Recent Labs  09/18/16 0250 09/18/16 0551 09/19/16 0648 09/19/16 0845 09/20/16 0525  NA 141  --  142  --  139  K 4.0  --  4.2  --  3.5    CL 100*  --  98*  --  93*  CO2 32  --  33*  --  34*  GLUCOSE 130*  --  109*  --  97  BUN 14  --  16  --  25*  CREATININE 0.81 0.83 0.78  --  0.98  CALCIUM 8.8*  --  9.4  --  9.3  MG  --   --   --  2.2 2.1  PHOS  --   --   --  3.8 4.1   Liver Function Tests:  Recent Labs  05/15/16 09/19/16 0648 09/20/16 0525  AST 15 21 19   ALT 14 18 19   ALKPHOS 52 64 63  BILITOT  --  0.4 0.7  PROT  --  7.0 7.0  ALBUMIN  --  3.5 3.8   No results for input(s): LIPASE, AMYLASE in the last 8760 hours. No results for input(s): AMMONIA in the last 8760 hours. CBC:  Recent Labs  08/18/16  09/18/16 0551 09/19/16 0648 09/20/16 0525  WBC 9.0  < > 8.7 8.5 11.2*  NEUTROABS 6,480  --   --   --  8.6*  HGB 10.9*  < > 11.3* 12.5 12.7  HCT 34*  < > 36.5 40.4 40.5  MCV  --   < > 98.4 97.1 96.4  PLT 240  < > 258 268 340  < > = values in this interval not displayed. TSH: No results for input(s): TSH in the last 8760 hours. A1C: Lab Results  Component Value Date   HGBA1C 5.6 08/18/2016   Lipid Panel:  Recent Labs  12/08/15  CHOL 160  HDL 32*  LDLCALC 92  TRIG 181*   IMPRESSION:  Abnormal MRI cervical spine (without) demonstrating: 1. At C6-7: disc bulging and facet hypertrophy with moderate-severe spinal stenosis, moderate right and severe left foraminal stenosis  2. At C5-6: disc bulging and facet hypertrophy with moderate-severe spinal stenosis and moderate  biforaminal stenosis  3. At C4-5: uncovertebral joint hypertrophy with mild biforaminal stenosis  4. At C3-4: uncovertebral joint hypertrophy with mild left foraminal stenosis  5. Patient motion artifact limits this study. Consider repeat scan with sedation if indicated.    Assessment/Plan 1. COPD exacerbation ([place]) Improving.  Levaquin course completed.  Continue prednisone taper and scheduled breathing treatments.  Continue Mucinex as needed for cough.     2. Bilateral lower extremity edema Chronic.  Initiate compression  stockings.  No respiratory signs of fluid volume overload.  Will not increase Lasix at this time given blood pressure of 98/58.  Encouraged patient to limit sodium and elevate legs when possible.     3. Diastolic CHF, chronic ([facility]) Stable.  Continue carvedilol and furosemide.  Will obtain BMP at next visit.    4. Ill-fitting dentures Dental referral for denture evaluation.    5. Hypotension, unspecified hypotension type -stable, will decrease valsartan to 20 mg daily due to low bp and monitor.    Carlos American. Harle Battiest  [facility] [phone] 8 am - 5 pm) [phone] (after hours)

## 2016-12-15 ENCOUNTER — Ambulatory Visit: Payer: Medicare Other | Admitting: Acute Care

## 2016-12-28 ENCOUNTER — Inpatient Hospital Stay (HOSPITAL_COMMUNITY)
Admission: EM | Admit: 2016-12-28 | Discharge: 2017-01-01 | DRG: 291 | Disposition: A | Discharge status: Skilled Nursing Facility | Payer: Medicare Other | Attending: Internal Medicine | Admitting: Internal Medicine

## 2016-12-28 ENCOUNTER — Emergency Department (HOSPITAL_COMMUNITY): Payer: Medicare Other

## 2016-12-28 ENCOUNTER — Encounter (HOSPITAL_COMMUNITY): Payer: Self-pay

## 2016-12-28 ENCOUNTER — Non-Acute Institutional Stay (SKILLED_NURSING_FACILITY): Payer: Medicare Other | Admitting: Nurse Practitioner

## 2016-12-28 ENCOUNTER — Encounter: Payer: Self-pay | Admitting: Nurse Practitioner

## 2016-12-28 DIAGNOSIS — I959 Hypotension, unspecified: Secondary | ICD-10-CM

## 2016-12-28 DIAGNOSIS — R0602 Shortness of breath: Secondary | ICD-10-CM | POA: Diagnosis not present

## 2016-12-28 DIAGNOSIS — Z853 Personal history of malignant neoplasm of breast: Secondary | ICD-10-CM

## 2016-12-28 DIAGNOSIS — I5032 Chronic diastolic (congestive) heart failure: Secondary | ICD-10-CM

## 2016-12-28 DIAGNOSIS — M1990 Unspecified osteoarthritis, unspecified site: Secondary | ICD-10-CM | POA: Diagnosis present

## 2016-12-28 DIAGNOSIS — Z79899 Other long term (current) drug therapy: Secondary | ICD-10-CM

## 2016-12-28 DIAGNOSIS — Z66 Do not resuscitate: Secondary | ICD-10-CM | POA: Diagnosis present

## 2016-12-28 DIAGNOSIS — Z6841 Body Mass Index (BMI) 40.0 and over, adult: Secondary | ICD-10-CM

## 2016-12-28 DIAGNOSIS — K219 Gastro-esophageal reflux disease without esophagitis: Secondary | ICD-10-CM | POA: Diagnosis present

## 2016-12-28 DIAGNOSIS — Z22322 Carrier or suspected carrier of Methicillin resistant Staphylococcus aureus: Secondary | ICD-10-CM

## 2016-12-28 DIAGNOSIS — K0889 Other specified disorders of teeth and supporting structures: Secondary | ICD-10-CM | POA: Diagnosis not present

## 2016-12-28 DIAGNOSIS — J441 Chronic obstructive pulmonary disease with (acute) exacerbation: Secondary | ICD-10-CM | POA: Diagnosis present

## 2016-12-28 DIAGNOSIS — Z803 Family history of malignant neoplasm of breast: Secondary | ICD-10-CM

## 2016-12-28 DIAGNOSIS — N183 Chronic kidney disease, stage 3 unspecified: Secondary | ICD-10-CM | POA: Diagnosis present

## 2016-12-28 DIAGNOSIS — M51369 Other intervertebral disc degeneration, lumbar region without mention of lumbar back pain or lower extremity pain: Secondary | ICD-10-CM | POA: Diagnosis present

## 2016-12-28 DIAGNOSIS — I110 Hypertensive heart disease with heart failure: Secondary | ICD-10-CM | POA: Diagnosis present

## 2016-12-28 DIAGNOSIS — M5136 Other intervertebral disc degeneration, lumbar region: Secondary | ICD-10-CM | POA: Diagnosis present

## 2016-12-28 DIAGNOSIS — J9621 Acute and chronic respiratory failure with hypoxia: Secondary | ICD-10-CM | POA: Diagnosis present

## 2016-12-28 DIAGNOSIS — J449 Chronic obstructive pulmonary disease, unspecified: Secondary | ICD-10-CM

## 2016-12-28 DIAGNOSIS — Z9049 Acquired absence of other specified parts of digestive tract: Secondary | ICD-10-CM

## 2016-12-28 DIAGNOSIS — Z801 Family history of malignant neoplasm of trachea, bronchus and lung: Secondary | ICD-10-CM

## 2016-12-28 DIAGNOSIS — I5033 Acute on chronic diastolic (congestive) heart failure: Secondary | ICD-10-CM | POA: Diagnosis present

## 2016-12-28 DIAGNOSIS — Z9012 Acquired absence of left breast and nipple: Secondary | ICD-10-CM

## 2016-12-28 DIAGNOSIS — I700 Atherosclerosis of aorta: Secondary | ICD-10-CM | POA: Diagnosis present

## 2016-12-28 DIAGNOSIS — I130 Hypertensive heart and chronic kidney disease with heart failure and stage 1 through stage 4 chronic kidney disease, or unspecified chronic kidney disease: Principal | ICD-10-CM | POA: Diagnosis present

## 2016-12-28 DIAGNOSIS — Z972 Presence of dental prosthetic device (complete) (partial): Secondary | ICD-10-CM | POA: Insufficient documentation

## 2016-12-28 DIAGNOSIS — E039 Hypothyroidism, unspecified: Secondary | ICD-10-CM | POA: Diagnosis present

## 2016-12-28 DIAGNOSIS — M533 Sacrococcygeal disorders, not elsewhere classified: Secondary | ICD-10-CM

## 2016-12-28 HISTORY — DX: Atherosclerosis of aorta: I70.0

## 2016-12-28 HISTORY — DX: Diverticulosis of large intestine without perforation or abscess without bleeding: K57.30

## 2016-12-28 LAB — BASIC METABOLIC PANEL
Anion gap: 7 (ref 5–15)
BUN: 17 mg/dL (ref 6–20)
CALCIUM: 8.7 mg/dL — AB (ref 8.9–10.3)
CO2: 35 mmol/L — ABNORMAL HIGH (ref 22–32)
Chloride: 99 mmol/L — ABNORMAL LOW (ref 101–111)
Creatinine, Ser: 0.72 mg/dL (ref 0.44–1.00)
Glucose, Bld: 147 mg/dL — ABNORMAL HIGH (ref 65–99)
Potassium: 4 mmol/L (ref 3.5–5.1)
SODIUM: 141 mmol/L (ref 135–145)

## 2016-12-28 LAB — CBC WITH DIFFERENTIAL/PLATELET
BASOS PCT: 0 %
Basophils Absolute: 0 10*3/uL (ref 0.0–0.1)
EOS ABS: 0.3 10*3/uL (ref 0.0–0.7)
EOS PCT: 3 %
HCT: 36.9 % (ref 36.0–46.0)
HEMOGLOBIN: 11.3 g/dL — AB (ref 12.0–15.0)
Lymphocytes Relative: 20 %
Lymphs Abs: 1.8 10*3/uL (ref 0.7–4.0)
MCH: 30.3 pg (ref 26.0–34.0)
MCHC: 30.6 g/dL (ref 30.0–36.0)
MCV: 98.9 fL (ref 78.0–100.0)
MONO ABS: 0.5 10*3/uL (ref 0.1–1.0)
MONOS PCT: 6 %
NEUTROS PCT: 71 %
Neutro Abs: 6.6 10*3/uL (ref 1.7–7.7)
PLATELETS: 277 10*3/uL (ref 150–400)
RBC: 3.73 MIL/uL — ABNORMAL LOW (ref 3.87–5.11)
RDW: 14.5 % (ref 11.5–15.5)
WBC: 9.2 10*3/uL (ref 4.0–10.5)

## 2016-12-28 LAB — I-STAT CHEM 8, ED
BUN: 21 mg/dL — AB (ref 6–20)
Calcium, Ion: 1.12 mmol/L — ABNORMAL LOW (ref 1.15–1.40)
Chloride: 98 mmol/L — ABNORMAL LOW (ref 101–111)
Creatinine, Ser: 0.9 mg/dL (ref 0.44–1.00)
GLUCOSE: 144 mg/dL — AB (ref 65–99)
HCT: 50 % — ABNORMAL HIGH (ref 36.0–46.0)
HEMOGLOBIN: 17 g/dL — AB (ref 12.0–15.0)
Potassium: 4 mmol/L (ref 3.5–5.1)
Sodium: 141 mmol/L (ref 135–145)
TCO2: 38 mmol/L (ref 0–100)

## 2016-12-28 LAB — I-STAT TROPONIN, ED: TROPONIN I, POC: 0 ng/mL (ref 0.00–0.08)

## 2016-12-28 LAB — I-STAT CG4 LACTIC ACID, ED: LACTIC ACID, VENOUS: 0.99 mmol/L (ref 0.5–1.9)

## 2016-12-28 MED ORDER — PREDNISONE 20 MG PO TABS
60.0000 mg | ORAL_TABLET | Freq: Once | ORAL | Status: AC
  Administered 2016-12-28: 60 mg via ORAL
  Filled 2016-12-28: qty 3

## 2016-12-28 MED ORDER — IPRATROPIUM-ALBUTEROL 0.5-2.5 (3) MG/3ML IN SOLN
3.0000 mL | RESPIRATORY_TRACT | Status: AC
  Administered 2016-12-28 (×3): 3 mL via RESPIRATORY_TRACT
  Filled 2016-12-28: qty 6
  Filled 2016-12-28: qty 3

## 2016-12-28 NOTE — ED Notes (Signed)
Patient transported to X-ray 

## 2016-12-28 NOTE — ED Notes (Signed)
This RN attempted once to get Iv access after Roselyn Reef, Rn attempted twice. IV team consulted

## 2016-12-28 NOTE — Progress Notes (Signed)
Nursing Home Location:  [facility]  Place of Service: SNF (31)  PCP: Unice Cobble, MD  No Known Allergies  Chief Complaint  Patient presents with  . Medical Management of Chronic Issues    Resident is being seen for routine visit    HPI:  Patient is a 83 y.o. female seen today at [facility] for routine follow up on chronic conditions. Pt with past medical history of COPD, HTN, hypothyroidism, CHF, and CKD. Last month, pt was followed up for COPD exacerbation  conts to be doing well with improvement of symptoms with no chest tightness, wheezing, or shortness of breath at rest. She has mild dyspnea on exertion. Continues to wear 3L [place] 24-hour/day. She reports continued productive cough with a small amount of green sputum, worse in the AM.  Reports improvement in swelling in lower extremities, and states she has been compliant with her Lasix. Continues with complaints of ill fitting lower dentures which have been rubbing and causing sores to her lower gums. Dental consult has been ordered.   Review of Systems:  Review of Systems  Constitutional: Positive for fatigue. Negative for activity change, chills and fever.       Intermittent fatigue  HENT: Positive for dental problem (loose dentures) and mouth sores. Negative for congestion, postnasal drip, rhinorrhea and trouble swallowing.   Eyes:       Dry eyes  Respiratory: Positive for cough. Negative for chest tightness, shortness of breath and wheezing.   Cardiovascular: Negative for chest pain, palpitations and leg swelling.  Gastrointestinal: Negative for abdominal pain, constipation and diarrhea.  Genitourinary: Positive for frequency (due to diuretic). Negative for dysuria and flank pain.  Skin: Negative for wound.  Neurological: Negative for dizziness and light-headedness.    Past Medical History:  Diagnosis Date  . Anxiety state 02/25/2015  . Breast CA ([place])   . CKD (chronic kidney disease) stage 3, GFR 30-59  ml/min 12/15/2014  . COPD (chronic obstructive pulmonary disease) ([place])   . Diastolic CHF ([place]) 01/28/4258  . GERD without esophagitis 01/19/2016  . Hordeolum externum (stye)    07/01/2016  . HTN (hypertension) 01/18/2014  . Hypertension   . Hypothyroidism 01/18/2014  . Pneumonia   . Recurrent major depression ([place]) 10/03/2016  . Spondylosis, cervical, with myelopathy 04/11/2016   Past Surgical History:  Procedure Laterality Date  . BREAST SURGERY    . CHOLECYSTECTOMY    . COLONOSCOPY N/A 01/20/2014   Procedure: COLONOSCOPY;  Surgeon: Winfield Cunas., MD;  Location: [facility];  Service: Endoscopy;  Laterality: N/A;  . ESOPHAGOGASTRODUODENOSCOPY N/A 01/19/2014   Procedure: ESOPHAGOGASTRODUODENOSCOPY (EGD);  Surgeon: Jeryl Columbia, MD;  Location: [facility];  Service: Endoscopy;  Laterality: N/A;  . MASTECTOMY Left    Social History:   reports that she quit smoking about 17 years ago. Her smoking use included Cigarettes. She has a 100.00 pack-year smoking history. She has never used smokeless tobacco. She reports that she does not drink alcohol or use drugs.  Family History  Problem Relation Age of Onset  . Lung cancer Sister   . Breast cancer Sister   . Breast cancer Sister   . Breast cancer Daughter   . Heart attack Neg Hx     Medications: Patient's Medications  New Prescriptions   No medications on file  Previous Medications   ACETAMINOPHEN (TYLENOL) 325 MG TABLET    Take 650 mg by mouth every 12 (twelve) hours. Scheduled.   ALBUTEROL (PROVENTIL HFA;VENTOLIN  HFA) 108 (90 BASE) MCG/ACT INHALER    Inhale 2 puffs into the lungs every 6 (six) hours as needed for wheezing or shortness of breath.   ARFORMOTEROL (BROVANA) 15 MCG/2ML NEBU    Take 2 mLs (15 mcg total) by nebulization 2 (two) times daily.   BUDESONIDE (PULMICORT) 0.25 MG/2ML NEBULIZER SOLUTION    Take 0.25 mg by nebulization 2 (two) times daily.   CARVEDILOL (COREG) 3.125 MG TABLET    Take 3.125 mg by mouth 2 (two)  times daily with a meal. Hold for HR less than 60 and BP less than 100.   CHOLECALCIFEROL (VITAMIN D) 1000 UNITS TABLET    Take 1,000 Units by mouth daily. Reported on 04/11/2016   CYCLOSPORINE (RESTASIS) 0.05 % OPHTHALMIC EMULSION    Place 1 drop into both eyes 2 (two) times daily.   DEXTROMETHORPHAN-GUAIFENESIN (MUCINEX DM MAXIMUM STRENGTH) 60-1200 MG TB12    Take 1 capsule by mouth 2 (two) times daily. Reported on 04/11/2016   DULOXETINE (CYMBALTA) 60 MG CAPSULE    Take 1 capsule (60 mg total) by mouth daily.   FUROSEMIDE (LASIX) 40 MG TABLET    Take 40 mg by mouth daily.   GABAPENTIN (NEURONTIN) 300 MG CAPSULE    Take 300 mg by mouth 3 (three) times daily.    HYPROMELLOSE (ARTIFICIAL TEARS OP)    Apply 1 drop to eye 4 (four) times daily as needed (Both Eyes).   LEVOTHYROXINE (SYNTHROID, LEVOTHROID) 75 MCG TABLET    Take 75 mcg by mouth daily before breakfast.   MELATONIN 3 MG TABS    Take 3 mg by mouth at bedtime.    NITROGLYCERIN (NITROSTAT) 0.4 MG SL TABLET    Place 0.4 mg under the tongue every 5 (five) minutes as needed for chest pain. 3 DOSES MAX   OXYCODONE (OXY IR/ROXICODONE) 5 MG IMMEDIATE RELEASE TABLET    Take 5 mg by mouth every 6 (six) hours as needed for severe pain.   OXYGEN    Inhale into the lungs. Nasal O2 3L/min for hypoxia   PANTOPRAZOLE (PROTONIX) 40 MG TABLET    Take 40 mg by mouth daily. At 6 PM   PROPYLENE GLYCOL (SYSTANE BALANCE OP)    Place 1 drop into both eyes 2 (two) times daily.   SODIUM PHOSPHATES (RA SALINE ENEMA RE)    Place 1 Applicatorful rectally daily as needed (constipation).   TIOTROPIUM (SPIRIVA HANDIHALER) 18 MCG INHALATION CAPSULE    Place 1 capsule (18 mcg total) into inhaler and inhale daily.   VALSARTAN (DIOVAN) 40 MG TABLET    Take 20 mg by mouth daily.  Modified Medications   No medications on file  Discontinued Medications   VALSARTAN (DIOVAN) 40 MG TABLET    Take 1 tablet (40 mg total) by mouth daily.     Physical Exam: Vitals:   12/28/16  0959  BP: 130/62  Pulse: 79  Resp: 20  Temp: 99.8 F (37.7 C)  SpO2: 97%  Weight: 253 lb (114.8 kg)  Height: 5\' 3"  (1.6 m)    Physical Exam  Constitutional: She is oriented to person, place, and time. She appears well-developed and well-nourished. No distress.  HENT:  Head: Normocephalic and atraumatic.  Mouth/Throat: Oropharynx is clear and moist. No oropharyngeal exudate.  Eyes: Pupils are equal, round, and reactive to light. Right eye exhibits no discharge. Left eye exhibits no discharge.  Neck: Normal range of motion. Neck supple. No JVD present.  Cardiovascular: Normal rate and regular rhythm.  Exam reveals no gallop.   No murmur heard. Pulses:      Radial pulses are 2+ on the right side, and 2+ on the left side.       Dorsalis pedis pulses are 2+ on the right side, and 2+ on the left side.  Pulmonary/Chest: Effort normal. No tachypnea. No respiratory distress. She has decreased breath sounds in the right lower field and the left lower field. She has rhonchi in the right middle field and the left middle field.  Diminished throughout   Abdominal: Soft. Bowel sounds are normal. She exhibits no distension.  Musculoskeletal: She exhibits edema (+1 bilaterally).  Lymphadenopathy:    She has no cervical adenopathy.  Neurological: She is alert and oriented to person, place, and time. No cranial nerve deficit.  Tremor   Skin: Skin is warm and dry. She is not diaphoretic.  Psychiatric: She has a normal mood and affect.    Labs reviewed: Basic Metabolic Panel:  Recent Labs  09/18/16 0250 09/18/16 0551 09/19/16 0648 09/19/16 0845 09/20/16 0525  NA 141  --  142  --  139  K 4.0  --  4.2  --  3.5  CL 100*  --  98*  --  93*  CO2 32  --  33*  --  34*  GLUCOSE 130*  --  109*  --  97  BUN 14  --  16  --  25*  CREATININE 0.81 0.83 0.78  --  0.98  CALCIUM 8.8*  --  9.4  --  9.3  MG  --   --   --  2.2 2.1  PHOS  --   --   --  3.8 4.1   Liver Function Tests:  Recent Labs   05/15/16 09/19/16 0648 09/20/16 0525  AST 15 21 19   ALT 14 18 19   ALKPHOS 52 64 63  BILITOT  --  0.4 0.7  PROT  --  7.0 7.0  ALBUMIN  --  3.5 3.8   No results for input(s): LIPASE, AMYLASE in the last 8760 hours. No results for input(s): AMMONIA in the last 8760 hours. CBC:  Recent Labs  08/18/16  09/18/16 0551 09/19/16 0648 09/20/16 0525  WBC 9.0  < > 8.7 8.5 11.2*  NEUTROABS 6,480  --   --   --  8.6*  HGB 10.9*  < > 11.3* 12.5 12.7  HCT 34*  < > 36.5 40.4 40.5  MCV  --   < > 98.4 97.1 96.4  PLT 240  < > 258 268 340  < > = values in this interval not displayed. TSH: No results for input(s): TSH in the last 8760 hours. A1C: Lab Results  Component Value Date   HGBA1C 5.6 08/18/2016   Lipid Panel: No results for input(s): CHOL, HDL, LDLCALC, TRIG, CHOLHDL, LDLDIRECT in the last 8760 hours. IMPRESSION:  Abnormal MRI cervical spine (without) demonstrating: 1. At C6-7: disc bulging and facet hypertrophy with moderate-severe spinal stenosis, moderate right and severe left foraminal stenosis  2. At C5-6: disc bulging and facet hypertrophy with moderate-severe spinal stenosis and moderate biforaminal stenosis  3. At C4-5: uncovertebral joint hypertrophy with mild biforaminal stenosis  4. At C3-4: uncovertebral joint hypertrophy with mild left foraminal stenosis  5. Patient motion artifact limits this study. Consider repeat scan with sedation if indicated.    Assessment/Plan 1. Diastolic CHF, chronic (HCC) -Stable. Continue carvedilol and furosemide. -Weights have been stable at 253lb for three consecutive readings. -Decreased lower extremity  swelling today, 1+ bilaterally -Encouraged to limit sodium and elevated legs when possible   -Will obtain BMET   2. Chronic obstructive pulmonary disease, unspecified COPD type ([place]) - stable. Continue with scheduled breathing treatments.  -Continue O2 therapy at 3L [place] -Pulmonary to follow up post COPD exacerbation in 2  weeks  3. Ill-fitting dentures -Unresolved. Pt contines to have complaints about ill fitting dentures from last visit.  -Will verify that patient has been placed on facility dental consult list.    4. Hypotension Valsartan decreased to 20 mg daily at last visit, blood pressure stable.  Carlos American. Harle Battiest  [facility] [phone] 8 am - 5 pm) [phone] (after hours)

## 2016-12-28 NOTE — ED Notes (Signed)
Pt now on 4L [place] O2 94%

## 2016-12-28 NOTE — ED Provider Notes (Signed)
[facility] DEPT Provider Note   CSN: [id number] Arrival date & time: 12/28/16  2131     History   Chief Complaint Chief Complaint  Patient presents with  . Shortness of Breath    HPI Dawn Montoya is a 83 y.o. female. Patient presents with shortness of breath.  She reports that she has been short of breath for months.  She is a relatively poor historian.  EMS reports that she comes from an assisted living facility.  They called EMS because she was hypoxic to the mid 80s.  She has been increasingly short of breath over the last few days.  She was reportedly treated for a pneumonia about 2 weeks ago as an outpatient.  Patient denies any recent fever or chills.  She endorses a cough which is been present for months.  She does state her cough is been worse.  She denies any chest pain currently, although she says she had one episode of chest pain several days ago. She is on 2 l/min at baseline, but states this hasn't been helping the last few days. She has been unable to walk because she becomes so short of breath.  HPI  Past Medical History:  Diagnosis Date  . Anxiety state 02/25/2015  . Breast CA ([place])   . CKD (chronic kidney disease) stage 3, GFR 30-59 ml/min 12/15/2014  . COPD (chronic obstructive pulmonary disease) ([place])   . Diastolic CHF ([place]) 0/10/6008  . GERD without esophagitis 01/19/2016  . Hordeolum externum (stye)    07/01/2016  . HTN (hypertension) 01/18/2014  . Hypertension   . Hypothyroidism 01/18/2014  . Pneumonia   . Recurrent major depression ([place]) 10/03/2016  . Spondylosis, cervical, with myelopathy 04/11/2016    Patient Active Problem List   Diagnosis Date Noted  . Ill-fitting dentures 12/28/2016  . Recurrent major depression ([place]) 10/03/2016  . COPD exacerbation ([place]) 09/18/2016  . Pulmonary edema 09/18/2016  . Hypotension 08/16/2016  . Spondylosis, cervical, with myelopathy 04/11/2016  . Meningioma ([place]) 04/11/2016  . GERD without esophagitis 01/19/2016  .  Benign paroxysmal positional vertigo 07/18/2015  . Neuropathy ([place]) 07/13/2015  . Cervical myelopathy with cervical radiculopathy 05/08/2015  . Anxiety state 02/25/2015  . Solitary pulmonary nodule 01/01/2015  . CKD (chronic kidney disease) stage 3, GFR 30-59 ml/min 12/15/2014  . Carbon dioxide retention 12/15/2014  . Uncontrolled hypertension 10/25/2014  . Diastolic CHF, chronic ([place]) 02/22/2014  . COPD (chronic obstructive pulmonary disease) ([place]) 01/18/2014  . Acute on chronic respiratory failure with hypoxemia ([place]) 01/18/2014  . Anemia 01/18/2014  . Hypothyroidism 01/18/2014    Past Surgical History:  Procedure Laterality Date  . BREAST SURGERY    . CHOLECYSTECTOMY    . COLONOSCOPY N/A 01/20/2014   Procedure: COLONOSCOPY;  Surgeon: Winfield Cunas., MD;  Location: [facility];  Service: Endoscopy;  Laterality: N/A;  . ESOPHAGOGASTRODUODENOSCOPY N/A 01/19/2014   Procedure: ESOPHAGOGASTRODUODENOSCOPY (EGD);  Surgeon: Jeryl Columbia, MD;  Location: [facility];  Service: Endoscopy;  Laterality: N/A;  . MASTECTOMY Left     OB History    No data available       Home Medications    Prior to Admission medications   Medication Sig Start Date End Date Taking? Authorizing Provider  acetaminophen (TYLENOL) 325 MG tablet Take 650 mg by mouth every 12 (twelve) hours. Scheduled.    Historical Provider, MD  albuterol (PROVENTIL HFA;VENTOLIN HFA) 108 (90 Base) MCG/ACT inhaler Inhale 2 puffs into the lungs every 6 (six) hours as needed for  wheezing or shortness of breath.    Historical Provider, MD  arformoterol (BROVANA) 15 MCG/2ML NEBU Take 2 mLs (15 mcg total) by nebulization 2 (two) times daily. 10/04/16   Tammy S Parrett, NP  budesonide (PULMICORT) 0.25 MG/2ML nebulizer solution Take 0.25 mg by nebulization 2 (two) times daily.    Historical Provider, MD  carvedilol (COREG) 3.125 MG tablet Take 3.125 mg by mouth 2 (two) times daily with a meal. Hold for HR less than 60 and BP less  than 100.    Historical Provider, MD  cholecalciferol (VITAMIN D) 1000 UNITS tablet Take 1,000 Units by mouth daily. Reported on 04/11/2016    Historical Provider, MD  cycloSPORINE (RESTASIS) 0.05 % ophthalmic emulsion Place 1 drop into both eyes 2 (two) times daily.    Historical Provider, MD  Dextromethorphan-Guaifenesin (MUCINEX DM MAXIMUM STRENGTH) 60-1200 MG TB12 Take 1 capsule by mouth 2 (two) times daily. Reported on 04/11/2016    Historical Provider, MD  DULoxetine (CYMBALTA) 60 MG capsule Take 1 capsule (60 mg total) by mouth daily. 03/29/15   Lauree Chandler, NP  furosemide (LASIX) 40 MG tablet Take 40 mg by mouth daily.    Historical Provider, MD  gabapentin (NEURONTIN) 300 MG capsule Take 300 mg by mouth 3 (three) times daily.     Historical Provider, MD  Hypromellose (ARTIFICIAL TEARS OP) Apply 1 drop to eye 4 (four) times daily as needed (Both Eyes).    Historical Provider, MD  levothyroxine (SYNTHROID, LEVOTHROID) 75 MCG tablet Take 75 mcg by mouth daily before breakfast.    Historical Provider, MD  Melatonin 3 MG TABS Take 3 mg by mouth at bedtime.     Historical Provider, MD  nitroGLYCERIN (NITROSTAT) 0.4 MG SL tablet Place 0.4 mg under the tongue every 5 (five) minutes as needed for chest pain. 3 DOSES MAX    Historical Provider, MD  oxyCODONE (OXY IR/ROXICODONE) 5 MG immediate release tablet Take 5 mg by mouth every 6 (six) hours as needed for severe pain.    Historical Provider, MD  OXYGEN Inhale into the lungs. Nasal O2 3L/min for hypoxia    Historical Provider, MD  pantoprazole (PROTONIX) 40 MG tablet Take 40 mg by mouth daily. At 6 PM    Historical Provider, MD  Propylene Glycol (SYSTANE BALANCE OP) Place 1 drop into both eyes 2 (two) times daily.    Historical Provider, MD  Sodium Phosphates (RA SALINE ENEMA RE) Place 1 Applicatorful rectally daily as needed (constipation).    Historical Provider, MD  tiotropium (SPIRIVA HANDIHALER) 18 MCG inhalation capsule Place 1 capsule (18  mcg total) into inhaler and inhale daily. 02/25/14   Charlynne Cousins, MD  valsartan (DIOVAN) 40 MG tablet Take 20 mg by mouth daily.    Historical Provider, MD    Family History Family History  Problem Relation Age of Onset  . Lung cancer Sister   . Breast cancer Sister   . Breast cancer Sister   . Breast cancer Daughter   . Heart attack Neg Hx     Social History Social History  Substance Use Topics  . Smoking status: Former Smoker    Packs/day: 2.00    Years: 50.00    Types: Cigarettes    Quit date: 10/25/1999  . Smokeless tobacco: Never Used  . Alcohol use No     Allergies   Patient has no known allergies.   Review of Systems Review of Systems  Constitutional: Negative for chills and fever.  HENT:  Negative for ear pain and sore throat.   Eyes: Negative for pain and visual disturbance.  Respiratory: Positive for cough and shortness of breath.   Cardiovascular: Positive for chest pain. Negative for palpitations.  Gastrointestinal: Negative for abdominal pain and vomiting.  Genitourinary: Negative for dysuria and hematuria.  Musculoskeletal: Negative for arthralgias and back pain.  Skin: Negative for color change and rash.  Neurological: Negative for seizures and syncope.  All other systems reviewed and are negative.    Physical Exam Updated Vital Signs BP (!) 119/48   Pulse 61   Resp 17   Ht 5\' 3"  (1.6 m)   Wt 114.8 kg   SpO2 92%   BMI 44.82 kg/m   Physical Exam  Constitutional: She appears well-developed and well-nourished. No distress.  HENT:  Head: Normocephalic and atraumatic.  Eyes: Conjunctivae are normal.  Neck: Neck supple.  Cardiovascular: Normal rate, regular rhythm and intact distal pulses.   No murmur heard. Pulmonary/Chest: She is in respiratory distress. She has no wheezes. She has no rales.  Very poor aeration in all lung fields. Tachypneic with mildly increased work of breathing.   Abdominal: Soft. There is no tenderness.    Musculoskeletal: She exhibits edema (1+ pitting edema to b/l lower extremities). She exhibits no tenderness or deformity.  Neurological: She is alert.  Skin: Skin is warm and dry.  Psychiatric: She has a normal mood and affect.  Nursing note and vitals reviewed.    ED Treatments / Results  Labs (all labs ordered are listed, but only abnormal results are displayed) Labs Reviewed  CBC WITH DIFFERENTIAL/PLATELET - Abnormal; Notable for the following:       Result Value   RBC 3.73 (*)    Hemoglobin 11.3 (*)    All other components within normal limits  BASIC METABOLIC PANEL - Abnormal; Notable for the following:    Chloride 99 (*)    CO2 35 (*)    Glucose, Bld 147 (*)    Calcium 8.7 (*)    All other components within normal limits  I-STAT CHEM 8, ED - Abnormal; Notable for the following:    Chloride 98 (*)    BUN 21 (*)    Glucose, Bld 144 (*)    Calcium, Ion 1.12 (*)    Hemoglobin 17.0 (*)    HCT 50.0 (*)    All other components within normal limits  BRAIN NATRIURETIC PEPTIDE  I-STAT CG4 LACTIC ACID, ED  Randolm Idol, ED    EKG  EKG Interpretation  Date/Time:  Wednesday December 28 2016 21:37:44 EST Ventricular Rate:  65 PR Interval:    QRS Duration: 98 QT Interval:  408 QTC Calculation: 425 R Axis:   78 Text Interpretation:  Sinus rhythm Nonspecific T abnrm, anterolateral leads Confirmed by Gilford Raid MD, JULIE ([id number]) on 12/28/2016 9:40:35 PM       Radiology Dg Chest 1 View  Result Date: 12/28/2016 CLINICAL DATA:  Dyspnea tonight EXAM: CHEST 1 VIEW COMPARISON:  11/24/2016 FINDINGS: Moderate cardiomegaly. Mild vascular and interstitial prominence. Moderate left pleural effusion. IMPRESSION: Probable congestive heart failure with moderate left pleural effusion. Electronically Signed   By: Andreas Newport M.D.   On: 12/28/2016 22:31   Dg Shoulder Left  Result Date: 12/28/2016 CLINICAL DATA:  Dyspnea and left shoulder pain without trauma. 2 days duration. EXAM:  LEFT SHOULDER - 2+ VIEW COMPARISON:  None. FINDINGS: Negative for fracture or dislocation. Moderate degenerative AC joint changes. No bone lesion or bone destruction. IMPRESSION: No acute  findings.  Moderate AC degenerative changes. Electronically Signed   By: Andreas Newport M.D.   On: 12/28/2016 22:32    Procedures Procedures (including critical care time)  Medications Ordered in ED Medications  ipratropium-albuterol (DUONEB) 0.5-2.5 (3) MG/3ML nebulizer solution 3 mL (3 mLs Nebulization Given 12/28/16 2324)  predniSONE (DELTASONE) tablet 60 mg (60 mg Oral Given 12/28/16 2302)     Initial Impression / Assessment and Plan / ED Course  I have reviewed the triage vital signs and the nursing notes.  Pertinent labs & imaging results that were available during my care of the patient were reviewed by me and considered in my medical decision making (see chart for details).    Patient is an 83 year old female with history as above who presents with shortness of breath.  She is a poor historian and it is difficult to ascertain exactly when she became short of breath.  However, reviewing the records, it appears that she was treated for a COPD exacerbation about 2 weeks ago.  Patient indicates that her breathing and her cough has been worse over the last few days.  She was hypoxic at her facility to 84% on 2 L via nasal cannula.  She does have a baseline O2 requirement of 2 L/m.  She improved here initially on 6 L/m and has been able to be titrated down to 3 L/m at rest.  She does get more short of breath with exertion.  X-ray shows pulmonary edema, worse from x-ray performed about one month ago.  Lactic acid and troponin normal.  She has poor aeration in all lung fields.  I suspect her presentation today is combination of volume overload and COPD exacerbation.  She was given DuoNebs x 3 in ED along with PO prednisone.  Given her dyspnea and increased O2 requirement, will admit to hospitalist for further  treatment. I spoke to hospitalist who will admit patient.  Final Clinical Impressions(s) / ED Diagnoses   Final diagnoses:  SOB (shortness of breath)    New Prescriptions New Prescriptions   No medications on file     Clifton James, MD 12/28/16 2348    Isla Pence, MD 12/31/16 [phone]

## 2016-12-28 NOTE — ED Triage Notes (Signed)
Pt BIB [facility] from [facility] r/t SOB. Upon arrival O2 at facility 84-89% [place] 2L. EMS placed pt on 6L  O2 95%. Hx COPD and pneumonia.

## 2016-12-28 NOTE — ED Notes (Signed)
ED Provider at bedside. 

## 2016-12-29 DIAGNOSIS — E039 Hypothyroidism, unspecified: Secondary | ICD-10-CM | POA: Diagnosis present

## 2016-12-29 DIAGNOSIS — K219 Gastro-esophageal reflux disease without esophagitis: Secondary | ICD-10-CM | POA: Diagnosis present

## 2016-12-29 DIAGNOSIS — Z22322 Carrier or suspected carrier of Methicillin resistant Staphylococcus aureus: Secondary | ICD-10-CM | POA: Diagnosis not present

## 2016-12-29 DIAGNOSIS — I130 Hypertensive heart and chronic kidney disease with heart failure and stage 1 through stage 4 chronic kidney disease, or unspecified chronic kidney disease: Secondary | ICD-10-CM | POA: Diagnosis present

## 2016-12-29 DIAGNOSIS — Z9049 Acquired absence of other specified parts of digestive tract: Secondary | ICD-10-CM | POA: Diagnosis not present

## 2016-12-29 DIAGNOSIS — M533 Sacrococcygeal disorders, not elsewhere classified: Secondary | ICD-10-CM | POA: Diagnosis present

## 2016-12-29 DIAGNOSIS — I5033 Acute on chronic diastolic (congestive) heart failure: Secondary | ICD-10-CM

## 2016-12-29 DIAGNOSIS — R0602 Shortness of breath: Secondary | ICD-10-CM | POA: Diagnosis present

## 2016-12-29 DIAGNOSIS — Z801 Family history of malignant neoplasm of trachea, bronchus and lung: Secondary | ICD-10-CM | POA: Diagnosis not present

## 2016-12-29 DIAGNOSIS — N183 Chronic kidney disease, stage 3 (moderate): Secondary | ICD-10-CM

## 2016-12-29 DIAGNOSIS — Z9012 Acquired absence of left breast and nipple: Secondary | ICD-10-CM | POA: Diagnosis not present

## 2016-12-29 DIAGNOSIS — I110 Hypertensive heart disease with heart failure: Secondary | ICD-10-CM

## 2016-12-29 DIAGNOSIS — E034 Atrophy of thyroid (acquired): Secondary | ICD-10-CM | POA: Diagnosis not present

## 2016-12-29 DIAGNOSIS — Z803 Family history of malignant neoplasm of breast: Secondary | ICD-10-CM | POA: Diagnosis not present

## 2016-12-29 DIAGNOSIS — Z853 Personal history of malignant neoplasm of breast: Secondary | ICD-10-CM | POA: Diagnosis not present

## 2016-12-29 DIAGNOSIS — J9621 Acute and chronic respiratory failure with hypoxia: Secondary | ICD-10-CM | POA: Diagnosis not present

## 2016-12-29 DIAGNOSIS — Z79899 Other long term (current) drug therapy: Secondary | ICD-10-CM | POA: Diagnosis not present

## 2016-12-29 DIAGNOSIS — Z6841 Body Mass Index (BMI) 40.0 and over, adult: Secondary | ICD-10-CM | POA: Diagnosis not present

## 2016-12-29 DIAGNOSIS — J441 Chronic obstructive pulmonary disease with (acute) exacerbation: Secondary | ICD-10-CM

## 2016-12-29 DIAGNOSIS — M5136 Other intervertebral disc degeneration, lumbar region: Secondary | ICD-10-CM | POA: Diagnosis present

## 2016-12-29 DIAGNOSIS — M150 Primary generalized (osteo)arthritis: Secondary | ICD-10-CM | POA: Diagnosis not present

## 2016-12-29 DIAGNOSIS — Z66 Do not resuscitate: Secondary | ICD-10-CM | POA: Diagnosis present

## 2016-12-29 DIAGNOSIS — M1990 Unspecified osteoarthritis, unspecified site: Secondary | ICD-10-CM | POA: Diagnosis present

## 2016-12-29 DIAGNOSIS — I700 Atherosclerosis of aorta: Secondary | ICD-10-CM | POA: Diagnosis present

## 2016-12-29 LAB — MRSA PCR SCREENING: MRSA by PCR: POSITIVE — AB

## 2016-12-29 LAB — BRAIN NATRIURETIC PEPTIDE: B NATRIURETIC PEPTIDE 5: 121.8 pg/mL — AB (ref 0.0–100.0)

## 2016-12-29 MED ORDER — ENOXAPARIN SODIUM 40 MG/0.4ML ~~LOC~~ SOLN
40.0000 mg | Freq: Every day | SUBCUTANEOUS | Status: DC
  Administered 2016-12-29 – 2017-01-01 (×4): 40 mg via SUBCUTANEOUS
  Filled 2016-12-29 (×5): qty 0.4

## 2016-12-29 MED ORDER — GABAPENTIN 300 MG PO CAPS
300.0000 mg | ORAL_CAPSULE | Freq: Three times a day (TID) | ORAL | Status: DC
  Administered 2016-12-29 – 2017-01-01 (×10): 300 mg via ORAL
  Filled 2016-12-29 (×10): qty 1

## 2016-12-29 MED ORDER — BISACODYL 10 MG RE SUPP
10.0000 mg | Freq: Every day | RECTAL | Status: DC | PRN
  Administered 2017-01-01: 10 mg via RECTAL
  Filled 2016-12-29: qty 1

## 2016-12-29 MED ORDER — DULOXETINE HCL 60 MG PO CPEP
60.0000 mg | ORAL_CAPSULE | Freq: Every day | ORAL | Status: DC
  Administered 2016-12-29 – 2017-01-01 (×4): 60 mg via ORAL
  Filled 2016-12-29 (×4): qty 1

## 2016-12-29 MED ORDER — TIOTROPIUM BROMIDE MONOHYDRATE 18 MCG IN CAPS
18.0000 ug | ORAL_CAPSULE | Freq: Every day | RESPIRATORY_TRACT | Status: DC
  Administered 2016-12-30 – 2016-12-31 (×2): 18 ug via RESPIRATORY_TRACT
  Filled 2016-12-29 (×3): qty 5

## 2016-12-29 MED ORDER — ALBUTEROL SULFATE (2.5 MG/3ML) 0.083% IN NEBU: 2.5000 mg | INHALATION_SOLUTION | Freq: Four times a day (QID) | RESPIRATORY_TRACT | Status: DC | PRN

## 2016-12-29 MED ORDER — LEVOTHYROXINE SODIUM 75 MCG PO TABS
75.0000 ug | ORAL_TABLET | Freq: Every day | ORAL | Status: DC
  Administered 2016-12-29 – 2017-01-01 (×4): 75 ug via ORAL
  Filled 2016-12-29 (×4): qty 1

## 2016-12-29 MED ORDER — PREDNISONE 20 MG PO TABS
40.0000 mg | ORAL_TABLET | Freq: Every day | ORAL | Status: DC
  Administered 2016-12-29 – 2017-01-01 (×4): 40 mg via ORAL
  Filled 2016-12-29 (×4): qty 2

## 2016-12-29 MED ORDER — VITAMIN D 1000 UNITS PO TABS
1000.0000 [IU] | ORAL_TABLET | Freq: Every day | ORAL | Status: DC
  Administered 2016-12-29 – 2016-12-31 (×3): 1000 [IU] via ORAL
  Filled 2016-12-29 (×3): qty 1

## 2016-12-29 MED ORDER — ONDANSETRON HCL 4 MG/2ML IJ SOLN: 4.0000 mg | Freq: Four times a day (QID) | INTRAMUSCULAR | Status: DC | PRN

## 2016-12-29 MED ORDER — AZITHROMYCIN 250 MG PO TABS
500.0000 mg | ORAL_TABLET | Freq: Once | ORAL | Status: AC
  Administered 2016-12-29: 500 mg via ORAL
  Filled 2016-12-29: qty 2

## 2016-12-29 MED ORDER — FUROSEMIDE 10 MG/ML IJ SOLN
40.0000 mg | Freq: Every day | INTRAMUSCULAR | Status: DC
  Administered 2016-12-29: 40 mg via INTRAVENOUS
  Filled 2016-12-29: qty 4

## 2016-12-29 MED ORDER — SODIUM CHLORIDE 0.9 % IV SOLN: 250.0000 mL | INTRAVENOUS | Status: DC | PRN

## 2016-12-29 MED ORDER — FUROSEMIDE 10 MG/ML IJ SOLN
40.0000 mg | Freq: Two times a day (BID) | INTRAMUSCULAR | Status: DC
  Administered 2016-12-29 – 2017-01-01 (×6): 40 mg via INTRAVENOUS
  Filled 2016-12-29 (×7): qty 4

## 2016-12-29 MED ORDER — SODIUM CHLORIDE 0.9% FLUSH
3.0000 mL | Freq: Two times a day (BID) | INTRAVENOUS | Status: DC
  Administered 2016-12-29 – 2017-01-01 (×8): 3 mL via INTRAVENOUS

## 2016-12-29 MED ORDER — CARVEDILOL 3.125 MG PO TABS
3.1250 mg | ORAL_TABLET | Freq: Two times a day (BID) | ORAL | Status: DC
  Administered 2016-12-29 – 2017-01-01 (×7): 3.125 mg via ORAL
  Filled 2016-12-29 (×7): qty 1

## 2016-12-29 MED ORDER — POLYVINYL ALCOHOL 1.4 % OP SOLN
1.0000 [drp] | Freq: Two times a day (BID) | OPHTHALMIC | Status: DC
  Administered 2016-12-29 – 2017-01-01 (×6): 1 [drp] via OPHTHALMIC
  Filled 2016-12-29: qty 15

## 2016-12-29 MED ORDER — PROPYLENE GLYCOL 0.6 % OP SOLN: 1.0000 [drp] | Freq: Two times a day (BID) | OPHTHALMIC | Status: DC

## 2016-12-29 MED ORDER — BUDESONIDE 0.25 MG/2ML IN SUSP
0.2500 mg | Freq: Two times a day (BID) | RESPIRATORY_TRACT | Status: DC
  Administered 2016-12-29 – 2016-12-31 (×6): 0.25 mg via RESPIRATORY_TRACT
  Filled 2016-12-29 (×7): qty 2

## 2016-12-29 MED ORDER — AZITHROMYCIN 250 MG PO TABS
250.0000 mg | ORAL_TABLET | Freq: Every day | ORAL | Status: DC
  Administered 2016-12-30 – 2017-01-01 (×3): 250 mg via ORAL
  Filled 2016-12-29 (×4): qty 1

## 2016-12-29 MED ORDER — CYCLOSPORINE 0.05 % OP EMUL
1.0000 [drp] | Freq: Two times a day (BID) | OPHTHALMIC | Status: DC
  Administered 2016-12-29 – 2017-01-01 (×6): 1 [drp] via OPHTHALMIC
  Filled 2016-12-29 (×7): qty 1

## 2016-12-29 MED ORDER — OXYCODONE HCL 5 MG PO TABS: 5.0000 mg | ORAL_TABLET | Freq: Four times a day (QID) | ORAL | Status: DC | PRN

## 2016-12-29 MED ORDER — MAGNESIUM HYDROXIDE 400 MG/5ML PO SUSP: 30.0000 mL | Freq: Every day | ORAL | Status: DC | PRN

## 2016-12-29 MED ORDER — ACETAMINOPHEN 325 MG PO TABS
650.0000 mg | ORAL_TABLET | Freq: Two times a day (BID) | ORAL | Status: DC
  Administered 2016-12-29 – 2017-01-01 (×7): 650 mg via ORAL
  Filled 2016-12-29 (×8): qty 2

## 2016-12-29 MED ORDER — DEXTROSE 5 % IV SOLN
1.0000 g | Freq: Every day | INTRAVENOUS | Status: DC
  Administered 2016-12-29 – 2016-12-31 (×4): 1 g via INTRAVENOUS
  Filled 2016-12-29 (×4): qty 10

## 2016-12-29 MED ORDER — ACETAMINOPHEN 325 MG PO TABS: 650.0000 mg | ORAL_TABLET | Freq: Four times a day (QID) | ORAL | Status: DC | PRN

## 2016-12-29 MED ORDER — ARFORMOTEROL TARTRATE 15 MCG/2ML IN NEBU
15.0000 ug | INHALATION_SOLUTION | Freq: Two times a day (BID) | RESPIRATORY_TRACT | Status: DC
  Administered 2016-12-29 – 2016-12-31 (×6): 15 ug via RESPIRATORY_TRACT
  Filled 2016-12-29 (×7): qty 2

## 2016-12-29 MED ORDER — LOSARTAN POTASSIUM 25 MG PO TABS
12.5000 mg | ORAL_TABLET | Freq: Every day | ORAL | Status: DC
  Administered 2016-12-29 – 2017-01-01 (×4): 12.5 mg via ORAL
  Filled 2016-12-29 (×4): qty 1

## 2016-12-29 MED ORDER — SODIUM CHLORIDE 0.9% FLUSH: 3.0000 mL | INTRAVENOUS | Status: DC | PRN

## 2016-12-29 MED ORDER — DM-GUAIFENESIN ER 30-600 MG PO TB12
1.0000 | ORAL_TABLET | Freq: Two times a day (BID) | ORAL | Status: DC
  Administered 2016-12-29 – 2017-01-01 (×7): 1 via ORAL
  Filled 2016-12-29 (×7): qty 1

## 2016-12-29 MED ORDER — ACETAMINOPHEN 325 MG PO TABS
650.0000 mg | ORAL_TABLET | ORAL | Status: DC | PRN
Start: 2016-12-29 — End: 2017-01-01
  Administered 2016-12-30: 650 mg via ORAL

## 2016-12-29 MED ORDER — PANTOPRAZOLE SODIUM 40 MG PO TBEC
40.0000 mg | DELAYED_RELEASE_TABLET | Freq: Every day | ORAL | Status: DC
  Administered 2016-12-29 – 2017-01-01 (×4): 40 mg via ORAL
  Filled 2016-12-29 (×4): qty 1

## 2016-12-29 NOTE — H&P (Signed)
History and Physical    Dawn Montoya PPI:[id number] DOB: 06-28-1932 DOA: 12/28/2016   PCP: Unice Cobble, MD Chief Complaint:  Chief Complaint  Patient presents with  . Shortness of Breath    HPI: Dawn Montoya is a 83 y.o. female with medical history significant of COPD on up to 3L O2 at baseline at home, diastolic CHF.  She has been feeling SOB for months but worse over the last few days.  ALF noted she was hypoxic to the mid 80s today.  Treated for COPD exacerbation about 2 weeks ago as outpatient.  Treatment at that time included a course of PO levaquin by Dr. [place] Bells it looks like based on records.   She does state her cough is been worse.  She denies any chest pain currently, although she says she had one episode of chest pain several days ago. She is on 2 l/min at baseline, but states this hasn't been helping the last few days. She has been unable to walk because she becomes so short of breath.  ED Course: CXR shows moderate L pleural effusion and suggests CHF.  Review of Systems: As per HPI otherwise 10 point review of systems negative.    Past Medical History:  Diagnosis Date  . Anxiety state 02/25/2015  . Breast CA ([place])   . CKD (chronic kidney disease) stage 3, GFR 30-59 ml/min 12/15/2014  . COPD (chronic obstructive pulmonary disease) ([place])   . Diastolic CHF ([place]) 0/03/3015  . GERD without esophagitis 01/19/2016  . Hordeolum externum (stye)    07/01/2016  . HTN (hypertension) 01/18/2014  . Hypertension   . Hypothyroidism 01/18/2014  . Pneumonia   . Recurrent major depression ([place]) 10/03/2016  . Spondylosis, cervical, with myelopathy 04/11/2016    Past Surgical History:  Procedure Laterality Date  . BREAST SURGERY    . CHOLECYSTECTOMY    . COLONOSCOPY N/A 01/20/2014   Procedure: COLONOSCOPY;  Surgeon: Winfield Cunas., MD;  Location: [facility];  Service: Endoscopy;  Laterality: N/A;  . ESOPHAGOGASTRODUODENOSCOPY N/A 01/19/2014   Procedure:  ESOPHAGOGASTRODUODENOSCOPY (EGD);  Surgeon: Jeryl Columbia, MD;  Location: [facility];  Service: Endoscopy;  Laterality: N/A;  . MASTECTOMY Left      reports that she quit smoking about 17 years ago. Her smoking use included Cigarettes. She has a 100.00 pack-year smoking history. She has never used smokeless tobacco. She reports that she does not drink alcohol or use drugs.  No Known Allergies  Family History  Problem Relation Age of Onset  . Lung cancer Sister   . Breast cancer Sister   . Breast cancer Sister   . Breast cancer Daughter   . Heart attack Neg Hx       Prior to Admission medications   Medication Sig Start Date End Date Taking? Authorizing Provider  acetaminophen (TYLENOL) 325 MG tablet Take 650 mg by mouth every 12 (twelve) hours. Scheduled.   Yes Historical Provider, MD  acetaminophen (TYLENOL) 325 MG tablet Take 650 mg by mouth every 6 (six) hours as needed for mild pain.   Yes Historical Provider, MD  albuterol (PROVENTIL HFA;VENTOLIN HFA) 108 (90 Base) MCG/ACT inhaler Inhale 2 puffs into the lungs every 6 (six) hours as needed for wheezing or shortness of breath.   Yes Historical Provider, MD  arformoterol (BROVANA) 15 MCG/2ML NEBU Take 2 mLs (15 mcg total) by nebulization 2 (two) times daily. 10/04/16  Yes Tammy S Parrett, NP  bisacodyl (DULCOLAX) 10 MG suppository Place 10 mg rectally as  needed for moderate constipation.   Yes Historical Provider, MD  budesonide (PULMICORT) 0.25 MG/2ML nebulizer solution Take 0.25 mg by nebulization 2 (two) times daily.   Yes Historical Provider, MD  carvedilol (COREG) 3.125 MG tablet Take 3.125 mg by mouth 2 (two) times daily with a meal. Hold for HR less than 60 and BP less than 100.   Yes Historical Provider, MD  cholecalciferol (VITAMIN D) 1000 UNITS tablet Take 1,000 Units by mouth daily. Reported on 04/11/2016   Yes Historical Provider, MD  cycloSPORINE (RESTASIS) 0.05 % ophthalmic emulsion Place 1 drop into both eyes 2 (two)  times daily.   Yes Historical Provider, MD  Dextromethorphan-Guaifenesin (MUCINEX DM MAXIMUM STRENGTH) 60-1200 MG TB12 Take 1 capsule by mouth 2 (two) times daily. Reported on 04/11/2016   Yes Historical Provider, MD  DULoxetine (CYMBALTA) 60 MG capsule Take 1 capsule (60 mg total) by mouth daily. 03/29/15  Yes Lauree Chandler, NP  furosemide (LASIX) 40 MG tablet Take 40 mg by mouth daily.   Yes Historical Provider, MD  gabapentin (NEURONTIN) 300 MG capsule Take 300 mg by mouth 3 (three) times daily.    Yes Historical Provider, MD  Hypromellose (ARTIFICIAL TEARS OP) Apply 1 drop to eye 4 (four) times daily as needed (Both Eyes).   Yes Historical Provider, MD  levothyroxine (SYNTHROID, LEVOTHROID) 75 MCG tablet Take 75 mcg by mouth daily before breakfast.   Yes Historical Provider, MD  magnesium hydroxide (MILK OF MAGNESIA) 400 MG/5ML suspension Take 30 mLs by mouth daily as needed for mild constipation.   Yes Historical Provider, MD  Melatonin 3 MG TABS Take 3 mg by mouth at bedtime.    Yes Historical Provider, MD  nitroGLYCERIN (NITROSTAT) 0.4 MG SL tablet Place 0.4 mg under the tongue every 5 (five) minutes as needed for chest pain. 3 DOSES MAX   Yes Historical Provider, MD  oxyCODONE (OXY IR/ROXICODONE) 5 MG immediate release tablet Take 5 mg by mouth every 6 (six) hours as needed for severe pain.   Yes Historical Provider, MD  pantoprazole (PROTONIX) 40 MG tablet Take 40 mg by mouth daily.    Yes Historical Provider, MD  Propylene Glycol (SYSTANE BALANCE) 0.6 % SOLN Place 1 drop into both eyes 2 (two) times daily.   Yes Historical Provider, MD  Sodium Phosphates (RA SALINE ENEMA) 19-7 GM/118ML ENEM Place 1 each rectally as needed (for constipation).   Yes Historical Provider, MD  tiotropium (SPIRIVA HANDIHALER) 18 MCG inhalation capsule Place 1 capsule (18 mcg total) into inhaler and inhale daily. 02/25/14  Yes Charlynne Cousins, MD  valsartan (DIOVAN) 40 MG tablet Take 20 mg by mouth daily.   Yes  Historical Provider, MD  OXYGEN Inhale into the lungs. Nasal O2 3L/min for hypoxia    Historical Provider, MD    Physical Exam: Vitals:   12/28/16 2141 12/28/16 2145 12/28/16 2200 12/28/16 2300  BP:  (!) 113/47 117/58 (!) 119/48  Pulse:  62 (!) 59 61  Resp:  19 17   SpO2:  95% 94% 92%  Weight: 114.8 kg (253 lb)     Height: 5\' 3"  (1.6 m)         Constitutional: NAD, calm, comfortable Eyes: PERRL, lids and conjunctivae normal ENMT: Mucous membranes are moist. Posterior pharynx clear of any exudate or lesions.Normal dentition.  Neck: normal, supple, no masses, no thyromegaly Respiratory: Poor air movement. Cardiovascular: Regular rate and rhythm, no murmurs / rubs / gallops. No extremity edema. 2+ pedal pulses. No  carotid bruits.  Abdomen: no tenderness, no masses palpated. No hepatosplenomegaly. Bowel sounds positive.  Musculoskeletal: no clubbing / cyanosis. No joint deformity upper and lower extremities. Good ROM, no contractures. Normal muscle tone.  Skin: no rashes, lesions, ulcers. No induration Neurologic: CN 2-12 grossly intact. Sensation intact, DTR normal. Strength 5/5 in all 4.  Psychiatric: Normal judgment and insight. Alert and oriented x 3. Normal mood.    Labs on Admission: I have personally reviewed following labs and imaging studies  CBC:  Recent Labs Lab 12/28/16 2250 12/28/16 2318  WBC 9.2  --   NEUTROABS 6.6  --   HGB 11.3* 17.0*  HCT 36.9 50.0*  MCV 98.9  --   PLT 277  --    Basic Metabolic Panel:  Recent Labs Lab 12/28/16 2250 12/28/16 2318  NA 141 141  K 4.0 4.0  CL 99* 98*  CO2 35*  --   GLUCOSE 147* 144*  BUN 17 21*  CREATININE 0.72 0.90  CALCIUM 8.7*  --    GFR: Estimated Creatinine Clearance: 57.9 mL/min (by C-G formula based on SCr of 0.9 mg/dL). Liver Function Tests: No results for input(s): AST, ALT, ALKPHOS, BILITOT, PROT, ALBUMIN in the last 168 hours. No results for input(s): LIPASE, AMYLASE in the last 168 hours. No  results for input(s): AMMONIA in the last 168 hours. Coagulation Profile: No results for input(s): INR, PROTIME in the last 168 hours. Cardiac Enzymes: No results for input(s): CKTOTAL, CKMB, CKMBINDEX, TROPONINI in the last 168 hours. BNP (last 3 results) No results for input(s): PROBNP in the last 8760 hours. HbA1C: No results for input(s): HGBA1C in the last 72 hours. CBG: No results for input(s): GLUCAP in the last 168 hours. Lipid Profile: No results for input(s): CHOL, HDL, LDLCALC, TRIG, CHOLHDL, LDLDIRECT in the last 72 hours. Thyroid Function Tests: No results for input(s): TSH, T4TOTAL, FREET4, T3FREE, THYROIDAB in the last 72 hours. Anemia Panel: No results for input(s): VITAMINB12, FOLATE, FERRITIN, TIBC, IRON, RETICCTPCT in the last 72 hours. Urine analysis:    Component Value Date/Time   COLORURINE YELLOW 09/18/2016 1637   APPEARANCEUR CLOUDY (A) 09/18/2016 1637   LABSPEC 1.015 09/18/2016 1637   PHURINE 7.0 09/18/2016 1637   GLUCOSEU NEGATIVE 09/18/2016 1637   HGBUR NEGATIVE 09/18/2016 1637   BILIRUBINUR NEGATIVE 09/18/2016 1637   KETONESUR NEGATIVE 09/18/2016 1637   PROTEINUR NEGATIVE 09/18/2016 1637   UROBILINOGEN 1.0 10/24/2014 2147   NITRITE NEGATIVE 09/18/2016 1637   LEUKOCYTESUR NEGATIVE 09/18/2016 1637   Sepsis Labs: @LABRCNTIP (procalcitonin:4,lacticidven:4) )No results found for this or any previous visit (from the past 240 hour(s)).   Radiological Exams on Admission: Dg Chest 1 View  Result Date: 12/28/2016 CLINICAL DATA:  Dyspnea tonight EXAM: CHEST 1 VIEW COMPARISON:  11/24/2016 FINDINGS: Moderate cardiomegaly. Mild vascular and interstitial prominence. Moderate left pleural effusion. IMPRESSION: Probable congestive heart failure with moderate left pleural effusion. Electronically Signed   By: Andreas Newport M.D.   On: 12/28/2016 22:31   Dg Shoulder Left  Result Date: 12/28/2016 CLINICAL DATA:  Dyspnea and left shoulder pain without trauma. 2 days  duration. EXAM: LEFT SHOULDER - 2+ VIEW COMPARISON:  None. FINDINGS: Negative for fracture or dislocation. Moderate degenerative AC joint changes. No bone lesion or bone destruction. IMPRESSION: No acute findings.  Moderate AC degenerative changes. Electronically Signed   By: Andreas Newport M.D.   On: 12/28/2016 22:32    EKG: Independently reviewed.  Assessment/Plan Principal Problem:   Acute on chronic respiratory failure with hypoxemia (  [place]) Active Problems:   Hypothyroidism   Acute on chronic diastolic CHF (congestive heart failure) ([facility])   COPD exacerbation ([place])    1. Acute on chronic resp failure with hypoxemia - COPD exacerbation that is being set off possibly by diastolic CHF exacerbation. 2. COPD exacerbation - 1. COPD pathway 2. Steroids 3. Neb treatments 4. Failed levaquin outpatient recently.  Will go ahead and put patient on rocephin / azithromycin as the next step up (also just incase that Tm in the ED of 99.3, and L pleural effusion is actually a PNA). 5. Continuous pulse ox 3. Acute on chronic diastolic CHF - 1. CHF pathway 2. Will give extra dose of 40mg  IV lasix now 3. Will convert her home 40mg  PO lasix daily to 40mg  IV lasix daily 4. BMP daily 5. 2d echo in Dec just showed grade 1 diastolic dysfunction 6. Strict intake and output 7. Tele monitor 8. Continue ARB 4. Hypothyroidism - continue synthroid   DVT prophylaxis: Lovenox Code Status: DNR Family Communication: No family in room Consults called: None Admission status: Admit to inpatient   Etta Quill DO [facility] Pager [phone] from 7PM-7AM  If 7AM-7PM, please contact the day physician for the patient [email or web address] Password [email or web address]  12/29/2016, 12:47 AM

## 2016-12-29 NOTE — Progress Notes (Signed)
Nutrition Brief Note  Received consult from the COPD Protocol.  Wt Readings from Last 15 Encounters:  12/29/16 249 lb 14.4 oz (113.4 kg)  12/28/16 253 lb (114.8 kg)  12/02/16 253 lb (114.8 kg)  11/24/16 253 lb (114.8 kg)  11/16/16 252 lb 9.6 oz (114.6 kg)  10/19/16 250 lb (113.4 kg)  10/04/16 250 lb 3.2 oz (113.5 kg)  09/22/16 250 lb 9.6 oz (113.7 kg)  09/18/16 251 lb 3.2 oz (113.9 kg)  09/06/16 250 lb (113.4 kg)  08/26/16 247 lb (112 kg)  08/16/16 247 lb (112 kg)  08/12/16 247 lb (112 kg)  07/19/16 248 lb (112.5 kg)  07/15/16 248 lb 9.6 oz (112.8 kg)    Body mass index is 44.27 kg/m. Patient meets criteria for class 3, extreme/morbid obesity based on current BMI.   Patient with no recent weight loss or gain. Good intake PTA and now. Currently receiving a heart healthy diet. Nutrition-Focused physical exam completed. Findings are no fat depletion, no muscle depletion, and mild edema.  Labs and medications reviewed.   No nutrition interventions warranted at this time. If nutrition issues arise, please consult RD.   Molli Barrows, RD, LDN, [place] Pager [phone] After Hours Pager [phone]

## 2016-12-29 NOTE — ED Notes (Signed)
Attempted report x1. 

## 2016-12-29 NOTE — ED Notes (Signed)
Pharmacy called to get meds verified in order to give abx.

## 2016-12-29 NOTE — Plan of Care (Signed)
Problem: Health Behavior/Discharge Planning: Goal: Ability to manage health-related needs will improve Outcome: Progressing Plan to return to [facility] upon discharge.

## 2016-12-29 NOTE — Progress Notes (Signed)
Progress Note    Dawn Montoya  XKG:[id number] DOB: May 12, 1932  DOA: 12/28/2016 PCP: Unice Cobble, MD    Brief Narrative:   Chief complaint: Shortness of breath  Records reviewed and summarized below.  Dawn Montoya is an 83 y.o. female with a PMH of COPD, chronic respiratory failure on 3 L of oxygen, chronic diastolic CHF (last Echo done 09/20/17: EF 70-26%, grade I diastolic dysfunction), stage III CKD (baseline creatinine 0.7-0.8) who was admitted 12/28/16 from ALF with hypoxia (oxygen saturations in the mid 80's). She was treated for a COPD exacerbation 11/24/16  & completed a course of Levaquin and a prednisone taper at that time. CXR showed moderate left pleural effusion and was suggestive of CHF.  Assessment/Plan:   Principal Problems:   Acute on chronic respiratory failure with hypoxemia (HCC)/COPD exacerbation/acute on chronic diastolic CHF Continue empiric Rocephin/azithromycin, albuterol as needed, Brovana, Spiriva, Pulmicort and prednisone. Continue higher dose Lasix, 40 mg IV twice a day. Continue ARB. No need to repeat echocardiogram at this time. Monitor I/O and daily weights. Chest x-ray personally reviewed and is as pictured below. Findings consistent with CHF.    Active Problems:   Hypothyroidism Continue Synthroid.    Severe obesity (BMI >= 40) (HCC) Body mass index is 44.27 kg/m.     Stage III chronic kidney disease Creatinine stable.    Hypertension/hypertensive heart disease with acute on chronic diastolic CHF Continue Coreg, Lasix and Cozaar.    MRSA carrier Contact precautions.   Family Communication/Anticipated D/C date and plan/Code Status   DVT prophylaxis: Lovenox ordered. Code Status: Full Code.  Family Communication: No family at the bedside. Disposition Plan: [facility] when stable, likely several more days.   Medical Consultants:    None.   Procedures:    None  Anti-Infectives:    Rocephin 12/29/16--->  Azithromycin  12/29/16--->  Subjective:   Patient reports that she does not have any chest pain, nausea, vomiting. Says she is breathing a bit better. Reports that the swelling in her feet has gone down. Says that her appetite is "okay ".  Objective:    Vitals:   12/29/16 0309 12/29/16 0432 12/29/16 0437 12/29/16 0632  BP: (!) 135/53   (!) 130/51  Pulse:  60  67  Resp:  15  20  Temp: 98.5 F (36.9 C)     TempSrc: Oral     SpO2: 91% 93% 91% 90%  Weight:    113.4 kg (249 lb 14.4 oz)  Height:        Intake/Output Summary (Last 24 hours) at 12/29/16 0737 Last data filed at 12/29/16 0631  Gross per 24 hour  Intake              170 ml  Output                0 ml  Net              170 ml   Filed Weights   12/28/16 2141 12/29/16 0632  Weight: 114.8 kg (253 lb) 113.4 kg (249 lb 14.4 oz)    Exam: General exam: Appears calm and comfortable. Obese. Respiratory system: Coarse wheeze at the left base and diminished breath sounds throughout. Respiratory effort mildly increased. Cardiovascular system: S1 & S2 heard, RRR. No JVD,  rubs, gallops or clicks. No murmurs. Gastrointestinal system: Abdomen is nondistended, soft and nontender. No organomegaly or masses felt. Normal bowel sounds heard. Central nervous system: Alert and oriented. No focal neurological deficits.  Extremities: No clubbing,  or cyanosis. 1+ edema. Skin: Venous stasis dermatitis, otherwise no rashes, lesions or ulcers. Psychiatry: Judgement and insight appear normal. Mood & affect appropriate.   Data Reviewed:   I have personally reviewed following labs and imaging studies:  Labs: Montoya Metabolic Panel:  Recent Labs Lab 12/28/16 2250 12/28/16 2318  NA 141 141  K 4.0 4.0  CL 99* 98*  CO2 35*  --   GLUCOSE 147* 144*  BUN 17 21*  CREATININE 0.72 0.90  CALCIUM 8.7*  --    GFR Estimated Creatinine Clearance: 57.4 mL/min (by C-G formula based on SCr of 0.9 mg/dL).  CBC:  Recent Labs Lab 12/28/16 2250 12/28/16 2318    WBC 9.2  --   NEUTROABS 6.6  --   HGB 11.3* 17.0*  HCT 36.9 50.0*  MCV 98.9  --   PLT 277  --    Sepsis Labs:  Recent Labs Lab 12/28/16 2250 12/28/16 2303  WBC 9.2  --   LATICACIDVEN  --  0.99    Microbiology Recent Results (from the past 240 hour(s))  MRSA PCR Screening     Status: Abnormal   Collection Time: 12/29/16  4:08 AM  Result Value Ref Range Status   MRSA by PCR POSITIVE (A) NEGATIVE Final    Comment:        The GeneXpert MRSA Assay (FDA approved for NASAL specimens only), is one component of a comprehensive MRSA colonization surveillance program. It is not intended to diagnose MRSA infection nor to guide or monitor treatment for MRSA infections. RESULT CALLED TO, READ BACK BY AND VERIFIED WITH: C SMITH,RN @0711  12/29/16 MKELLY,MLT     Radiology: Dg Chest 1 View  Result Date: 12/28/2016 CLINICAL DATA:  Dyspnea tonight EXAM: CHEST 1 VIEW COMPARISON:  11/24/2016 FINDINGS: Moderate cardiomegaly. Mild vascular and interstitial prominence. Moderate left pleural effusion. IMPRESSION: Probable congestive heart failure with moderate left pleural effusion. Electronically Signed   By: Andreas Newport M.D.   On: 12/28/2016 22:31   Dg Shoulder Left  Result Date: 12/28/2016 CLINICAL DATA:  Dyspnea and left shoulder pain without trauma. 2 days duration. EXAM: LEFT SHOULDER - 2+ VIEW COMPARISON:  None. FINDINGS: Negative for fracture or dislocation. Moderate degenerative AC joint changes. No bone lesion or bone destruction. IMPRESSION: No acute findings.  Moderate AC degenerative changes. Electronically Signed   By: Andreas Newport M.D.   On: 12/28/2016 22:32    Medications:   . acetaminophen  650 mg Oral Q12H  . arformoterol  15 mcg Nebulization BID  . [START ON 12/30/2016] azithromycin  250 mg Oral Daily  . budesonide  0.25 mg Nebulization BID  . carvedilol  3.125 mg Oral BID WC  . cefTRIAXone (ROCEPHIN)  IV  1 g Intravenous QHS  . cholecalciferol  1,000 Units  Oral Daily  . cycloSPORINE  1 drop Both Eyes BID  . dextromethorphan-guaiFENesin  1 tablet Oral BID  . DULoxetine  60 mg Oral Daily  . enoxaparin (LOVENOX) injection  40 mg Subcutaneous Daily  . furosemide  40 mg Intravenous Daily  . gabapentin  300 mg Oral TID  . levothyroxine  75 mcg Oral QAC breakfast  . losartan  12.5 mg Oral Daily  . pantoprazole  40 mg Oral Daily  . polyvinyl alcohol  1 drop Both Eyes BID  . predniSONE  40 mg Oral Q breakfast  . sodium chloride flush  3 mL Intravenous Q12H  . tiotropium  18 mcg Inhalation Daily   Continuous  Infusions:  Medical decision making is of high complexity and this patient is at high risk of deterioration, therefore this is a level 3 visit.  (> 4 problem points, >4 data points, high risk)  Pt admitted after midnight, so will not charge.   LOS: 0 days   Costantino Kohlbeck  [facility] Pager [phone]. If unable to reach me by pager, please call my cell phone at [phone].  *Please refer to [email or web address], password [email or web address] to get updated schedule on who will round on this patient, as hospitalists switch teams weekly. If 7PM-7AM, please contact night-coverage at [email or web address], password [email or web address] for any overnight needs.  12/29/2016, 7:37 AM

## 2016-12-29 NOTE — NC FL2 (Signed)
[place] LEVEL OF CARE SCREENING TOOL     IDENTIFICATION  Patient Name: Dawn Montoya Birthdate: Oct 08, 1932 Sex: female Admission Date (Current Location): 12/28/2016  [facility] and [place] Number:  Herbalist and Address:  [facility] [place]. [facility], [place] [address], [place], [place] [address]      Provider Number: [id number]  Attending Physician Name and Address:  Venetia Maxon Rama, MD  Relative Name and Phone Number:       Current Level of Care: Hospital Recommended Level of Care: [place] Prior Approval Number:    Date Approved/Denied:   PASRR Number:    Discharge Plan: SNF    Current Diagnoses: Patient Active Problem List   Diagnosis Date Noted  . Severe obesity (BMI >= 40) ([place]) 12/29/2016  . MRSA carrier 12/29/2016  . Ill-fitting dentures 12/28/2016  . Recurrent major depression ([place]) 10/03/2016  . COPD exacerbation ([place]) 09/18/2016  . Pulmonary edema 09/18/2016  . Spondylosis, cervical, with myelopathy 04/11/2016  . Meningioma ([place]) 04/11/2016  . GERD without esophagitis 01/19/2016  . Benign paroxysmal positional vertigo 07/18/2015  . Neuropathy ([place]) 07/13/2015  . Cervical myelopathy with cervical radiculopathy 05/08/2015  . Anxiety state 02/25/2015  . Solitary pulmonary nodule 01/01/2015  . CKD (chronic kidney disease) stage 3, GFR 30-59 ml/min 12/15/2014  . Carbon dioxide retention 12/15/2014  . Uncontrolled hypertension 10/25/2014  . Hypertensive heart disease with acute on chronic diastolic congestive heart failure ([place]) 10/24/2014  . Acute on chronic diastolic CHF (congestive heart failure) ([place]) 02/22/2014  . COPD (chronic obstructive pulmonary disease) ([place]) 01/18/2014  . Acute on chronic respiratory failure with hypoxemia ([place]) 01/18/2014  . Anemia 01/18/2014  . Hypothyroidism 01/18/2014    Orientation RESPIRATION BLADDER Height & Weight     Self, Time, Situation, Place  O2 (2-3 LPM) Continent  Weight: 249 lb 14.4 oz (113.4 kg) Height:  5\' 3"  (160 cm)  BEHAVIORAL SYMPTOMS/MOOD NEUROLOGICAL BOWEL NUTRITION STATUS      Continent  (heart healthy)  AMBULATORY STATUS COMMUNICATION OF NEEDS Skin   Limited Assist Verbally Normal                       Personal Care Assistance Level of Assistance  Bathing, Feeding, Dressing Bathing Assistance: Limited assistance Feeding assistance: Independent Dressing Assistance: Limited assistance Total Care Assistance: Limited assistance   Functional Limitations Info  Sight, Speech, Hearing Sight Info: Adequate Hearing Info: Adequate Speech Info: Adequate    SPECIAL CARE FACTORS FREQUENCY                       Contractures Contractures Info: Not present    Additional Factors Info  Code Status, Allergies (DNR) Code Status Info:  (DNR) Allergies Info:  (NKDA)           Current Medications (12/29/2016):  This is the current hospital active medication list Current Facility-Administered Medications  Medication Dose Route Frequency Provider Last Rate Last Dose  . 0.9 %  sodium chloride infusion  250 mL Intravenous PRN Etta Quill, DO      . acetaminophen (TYLENOL) tablet 650 mg  650 mg Oral Q12H Etta Quill, DO   650 mg at 12/29/16 2148  . acetaminophen (TYLENOL) tablet 650 mg  650 mg Oral Q4H PRN Etta Quill, DO      . albuterol (PROVENTIL) (2.5 MG/3ML) 0.083% nebulizer solution 2.5 mg  2.5 mg Inhalation Q6H PRN Etta Quill, DO      .  arformoterol (BROVANA) nebulizer solution 15 mcg  15 mcg Nebulization BID Etta Quill, DO   15 mcg at 12/29/16 2027  . [START ON 12/30/2016] azithromycin (ZITHROMAX) tablet 250 mg  250 mg Oral Daily Etta Quill, DO      . bisacodyl (DULCOLAX) suppository 10 mg  10 mg Rectal Daily PRN Etta Quill, DO      . budesonide (PULMICORT) nebulizer solution 0.25 mg  0.25 mg Nebulization BID Etta Quill, DO   0.25 mg at 12/29/16 2027  . carvedilol (COREG) tablet 3.125 mg  3.125  mg Oral BID WC Etta Quill, DO   3.125 mg at 12/29/16 1813  . cefTRIAXone (ROCEPHIN) 1 g in dextrose 5 % 50 mL IVPB  1 g Intravenous QHS Etta Quill, DO 100 mL/hr at 12/29/16 2144 1 g at 12/29/16 2144  . cholecalciferol (VITAMIN D) tablet 1,000 Units  1,000 Units Oral Daily Etta Quill, DO   1,000 Units at 12/29/16 1037  . cycloSPORINE (RESTASIS) 0.05 % ophthalmic emulsion 1 drop  1 drop Both Eyes BID Etta Quill, DO   1 drop at 12/29/16 2148  . dextromethorphan-guaiFENesin (MUCINEX DM) 30-600 MG per 12 hr tablet 1 tablet  1 tablet Oral BID Etta Quill, DO   1 tablet at 12/29/16 2157  . DULoxetine (CYMBALTA) DR capsule 60 mg  60 mg Oral Daily Etta Quill, DO   60 mg at 12/29/16 1039  . enoxaparin (LOVENOX) injection 40 mg  40 mg Subcutaneous Daily Etta Quill, DO   40 mg at 12/29/16 1820  . furosemide (LASIX) injection 40 mg  40 mg Intravenous BID Venetia Maxon Rama, MD   40 mg at 12/29/16 1814  . gabapentin (NEURONTIN) capsule 300 mg  300 mg Oral TID Etta Quill, DO   300 mg at 12/29/16 2148  . levothyroxine (SYNTHROID, LEVOTHROID) tablet 75 mcg  75 mcg Oral QAC breakfast Etta Quill, DO   75 mcg at 12/29/16 1038  . losartan (COZAAR) tablet 12.5 mg  12.5 mg Oral Daily Etta Quill, DO   12.5 mg at 12/29/16 1039  . magnesium hydroxide (MILK OF MAGNESIA) suspension 30 mL  30 mL Oral Daily PRN Etta Quill, DO      . ondansetron [facility]) injection 4 mg  4 mg Intravenous Q6H PRN Etta Quill, DO      . oxyCODONE (Oxy IR/ROXICODONE) immediate release tablet 5 mg  5 mg Oral Q6H PRN Etta Quill, DO      . pantoprazole (PROTONIX) EC tablet 40 mg  40 mg Oral Daily Etta Quill, DO   40 mg at 12/29/16 1040  . polyvinyl alcohol (LIQUIFILM TEARS) 1.4 % ophthalmic solution 1 drop  1 drop Both Eyes BID Etta Quill, DO   1 drop at 12/29/16 2147  . predniSONE (DELTASONE) tablet 40 mg  40 mg Oral Q breakfast Etta Quill, DO   40 mg at 12/29/16 1022  . sodium  chloride flush (NS) 0.9 % injection 3 mL  3 mL Intravenous Q12H Etta Quill, DO   3 mL at 12/29/16 2150  . sodium chloride flush (NS) 0.9 % injection 3 mL  3 mL Intravenous PRN Etta Quill, DO      . tiotropium [facility]) inhalation capsule 18 mcg  18 mcg Inhalation Daily Etta Quill, DO         Discharge Medications: Please see discharge summary for a list  of discharge medications.  Relevant Imaging Results:  Relevant Lab Results:   Additional Information  [id number] ssn)  Cassity Christian M, LCSW

## 2016-12-30 ENCOUNTER — Inpatient Hospital Stay (HOSPITAL_COMMUNITY): Payer: Medicare Other

## 2016-12-30 DIAGNOSIS — M533 Sacrococcygeal disorders, not elsewhere classified: Secondary | ICD-10-CM

## 2016-12-30 LAB — BASIC METABOLIC PANEL
Anion gap: 9 (ref 5–15)
BUN: 16 mg/dL (ref 6–20)
CHLORIDE: 97 mmol/L — AB (ref 101–111)
CO2: 36 mmol/L — AB (ref 22–32)
Calcium: 9 mg/dL (ref 8.9–10.3)
Creatinine, Ser: 0.74 mg/dL (ref 0.44–1.00)
GFR calc Af Amer: >60 mL/min (ref >60)
GFR calc non Af Amer: >60 mL/min (ref >60)
GLUCOSE: 119 mg/dL — AB (ref 65–99)
POTASSIUM: 4.3 mmol/L (ref 3.5–5.1)
Sodium: 142 mmol/L (ref 135–145)

## 2016-12-30 MED ORDER — SIMETHICONE 80 MG PO CHEW
80.0000 mg | CHEWABLE_TABLET | Freq: Four times a day (QID) | ORAL | Status: DC | PRN
  Administered 2016-12-30 – 2016-12-31 (×2): 80 mg via ORAL
  Filled 2016-12-30 (×2): qty 1

## 2016-12-30 MED ORDER — MUPIROCIN 2 % EX OINT
TOPICAL_OINTMENT | Freq: Two times a day (BID) | CUTANEOUS | Status: DC
  Administered 2016-12-30: 10:00:00 via NASAL
  Administered 2016-12-30: 1 via NASAL
  Administered 2016-12-31 – 2017-01-01 (×3): via NASAL
  Filled 2016-12-30: qty 22

## 2016-12-30 MED ORDER — CHLORHEXIDINE GLUCONATE CLOTH 2 % EX PADS
6.0000 | MEDICATED_PAD | Freq: Every day | CUTANEOUS | Status: DC
  Administered 2016-12-30 – 2017-01-01 (×3): 6 via TOPICAL

## 2016-12-30 MED ORDER — DIPHENHYDRAMINE HCL 25 MG PO CAPS: 25.0000 mg | ORAL_CAPSULE | Freq: Once | ORAL | Status: DC

## 2016-12-30 NOTE — Progress Notes (Signed)
Progress Note    Dawn Montoya  ATF:[id number] DOB: 12/09/1931  DOA: 12/28/2016 PCP: Unice Cobble, MD    Brief Narrative:   Chief complaint: Shortness of breath  Records reviewed and summarized below.  Dawn Montoya is an 83 y.o. female with a PMH of COPD, chronic respiratory failure on 3 L of oxygen, chronic diastolic CHF (last Echo done 09/20/17: EF 27-06%, grade I diastolic dysfunction), stage III CKD (baseline creatinine 0.7-0.8) who was admitted 12/28/16 from ALF with hypoxia (oxygen saturations in the mid 80's). She was treated for a COPD exacerbation 11/24/16  & completed a course of Levaquin and a prednisone taper at that time. CXR showed moderate left pleural effusion and was suggestive of CHF.  Assessment/Plan:   Principal Problems:   Acute on chronic respiratory failure with hypoxemia (HCC)/COPD exacerbation/acute on chronic diastolic CHF Continue empiric Rocephin/azithromycin, albuterol as needed, Brovana, Spiriva, Pulmicort and prednisone. Continue higher dose Lasix, 40 mg IV twice a day. Continue ARB. No need to repeat echocardiogram at this time. I/O balance and weight unchanged, but patient breathing better. Chest x-ray on admission shows findings consistent with CHF.    Active Problems:   Hypothyroidism Continue Synthroid.    Severe obesity (BMI >= 40) (HCC) Body mass index is 44.94 kg/m.     Stage III chronic kidney disease Creatinine stable.    Hypertension/hypertensive heart disease with acute on chronic diastolic CHF Continue Coreg, Lasix and Cozaar.    MRSA carrier Contact precautions.    Sacral pain Skin not red, no wounds. We'll get plain films to rule out arthritis of the SI joint.   Family Communication/Anticipated D/C date and plan/Code Status   DVT prophylaxis: Lovenox ordered. Code Status: Full Code.  Family Communication: No family at the bedside. Disposition Plan: [facility] when stable, Possibly tomorrow.   Medical Consultants:     None.   Procedures:    None  Anti-Infectives:    Rocephin 12/29/16--->  Azithromycin 12/29/16--->  Subjective:   Patient says she is breathing better, and feels as well as she does on a good day. Occasional cough. Appetite okay. No nausea or vomiting. No chest pain.  Objective:    Vitals:   12/30/16 0915 12/30/16 0916 12/30/16 0917 12/30/16 1548  BP:    130/64  Pulse:      Resp:      Temp:    98.1 F (36.7 C)  TempSrc:    Oral  SpO2: 95% 95% 94% 92%  Weight:      Height:        Intake/Output Summary (Last 24 hours) at 12/30/16 1601 Last data filed at 12/29/16 2200  Gross per 24 hour  Intake              170 ml  Output                0 ml  Net              170 ml   Filed Weights   12/29/16 0632 12/30/16 0050 12/30/16 0458  Weight: 113.4 kg (249 lb 14.4 oz) 115.1 kg (253 lb 11.2 oz) 115.1 kg (253 lb 11.2 oz)    Exam: General exam: Appears calm and comfortable. Obese. Respiratory system: Diminished breath sounds throughout, no wheeze. Respiratory effort unlabored. Cardiovascular system: S1 & S2 heard, RRR. No JVD,  rubs, gallops or clicks. No murmurs. Gastrointestinal system: Abdomen is nondistended, soft and nontender. No organomegaly or masses felt. Normal bowel sounds heard. Central  nervous system: Alert and oriented. No focal neurological deficits. Extremities: No clubbing,  or cyanosis. 1+ edema. Skin: Venous stasis dermatitis, otherwise no rashes, lesions or ulcers. Psychiatry: Judgement and insight appear normal. Mood & affect appropriate.   Data Reviewed:   I have personally reviewed following labs and imaging studies:  Labs: Basic Metabolic Panel:  Recent Labs Lab 12/28/16 2250 12/28/16 2318 12/30/16 0423  NA 141 141 142  K 4.0 4.0 4.3  CL 99* 98* 97*  CO2 35*  --  36*  GLUCOSE 147* 144* 119*  BUN 17 21* 16  CREATININE 0.72 0.90 0.74  CALCIUM 8.7*  --  9.0   GFR Estimated Creatinine Clearance: 65.2 mL/min (by C-G formula based on  SCr of 0.74 mg/dL).  CBC:  Recent Labs Lab 12/28/16 2250 12/28/16 2318  WBC 9.2  --   NEUTROABS 6.6  --   HGB 11.3* 17.0*  HCT 36.9 50.0*  MCV 98.9  --   PLT 277  --    Sepsis Labs:  Recent Labs Lab 12/28/16 2250 12/28/16 2303  WBC 9.2  --   LATICACIDVEN  --  0.99    Microbiology Recent Results (from the past 240 hour(s))  MRSA PCR Screening     Status: Abnormal   Collection Time: 12/29/16  4:08 AM  Result Value Ref Range Status   MRSA by PCR POSITIVE (A) NEGATIVE Final    Comment:        The GeneXpert MRSA Assay (FDA approved for NASAL specimens only), is one component of a comprehensive MRSA colonization surveillance program. It is not intended to diagnose MRSA infection nor to guide or monitor treatment for MRSA infections. RESULT CALLED TO, READ BACK BY AND VERIFIED WITH: C SMITH,RN @0711  12/29/16 MKELLY,MLT     Radiology: Dg Chest 1 View  Result Date: 12/28/2016 CLINICAL DATA:  Dyspnea tonight EXAM: CHEST 1 VIEW COMPARISON:  11/24/2016 FINDINGS: Moderate cardiomegaly. Mild vascular and interstitial prominence. Moderate left pleural effusion. IMPRESSION: Probable congestive heart failure with moderate left pleural effusion. Electronically Signed   By: Andreas Newport M.D.   On: 12/28/2016 22:31   Dg Shoulder Left  Result Date: 12/28/2016 CLINICAL DATA:  Dyspnea and left shoulder pain without trauma. 2 days duration. EXAM: LEFT SHOULDER - 2+ VIEW COMPARISON:  None. FINDINGS: Negative for fracture or dislocation. Moderate degenerative AC joint changes. No bone lesion or bone destruction. IMPRESSION: No acute findings.  Moderate AC degenerative changes. Electronically Signed   By: Andreas Newport M.D.   On: 12/28/2016 22:32    Medications:   . acetaminophen  650 mg Oral Q12H  . arformoterol  15 mcg Nebulization BID  . azithromycin  250 mg Oral Daily  . budesonide  0.25 mg Nebulization BID  . carvedilol  3.125 mg Oral BID WC  . cefTRIAXone (ROCEPHIN)   IV  1 g Intravenous QHS  . Chlorhexidine Gluconate Cloth  6 each Topical Q0600  . cholecalciferol  1,000 Units Oral Daily  . cycloSPORINE  1 drop Both Eyes BID  . dextromethorphan-guaiFENesin  1 tablet Oral BID  . DULoxetine  60 mg Oral Daily  . enoxaparin (LOVENOX) injection  40 mg Subcutaneous Daily  . furosemide  40 mg Intravenous BID  . gabapentin  300 mg Oral TID  . levothyroxine  75 mcg Oral QAC breakfast  . losartan  12.5 mg Oral Daily  . mupirocin ointment   Nasal BID  . pantoprazole  40 mg Oral Daily  . polyvinyl alcohol  1 drop  Both Eyes BID  . predniSONE  40 mg Oral Q breakfast  . sodium chloride flush  3 mL Intravenous Q12H  . tiotropium  18 mcg Inhalation Daily   Continuous Infusions:  Medical decision making is of high complexity and this patient is at high risk of deterioration, therefore this is a level 3 visit.  (> 4 problem points, 2 data points, Severe exacerbation of chronic illness)  Pt admitted after midnight, so will not charge.   LOS: 1 day   RAMA,CHRISTINA  [facility] Pager [phone]. If unable to reach me by pager, please call my cell phone at [phone].  *Please refer to [email or web address], password [email or web address] to get updated schedule on who will round on this patient, as hospitalists switch teams weekly. If 7PM-7AM, please contact night-coverage at [email or web address], password [email or web address] for any overnight needs.  12/30/2016, 4:01 PM

## 2016-12-30 NOTE — Clinical Social Work Note (Signed)
Clinical Social Work Assessment  Patient Details  Name: Dawn Montoya MRN: [id number] Date of Birth: [birth year]  Date of referral:  12/30/16               Reason for consult:  Facility Placement                Permission sought to share information with:  Family Supports Permission granted to share information::  Yes, Verbal Permission Granted  Name::     Dawn Montoya  Relationship::  Daughter  Contact Information:  [phone]  Housing/Transportation Living arrangements for the past 2 months:  [place] (Resident of [facility]) Source of Information:  Patient Patient Interpreter Needed:  None Criminal Activity/Legal Involvement Pertinent to Current Situation/Hospitalization:  No - Comment as needed Significant Relationships:  Adult Children Lives with:  Facility Resident Do you feel safe going back to the place where you live?  Yes Need for family participation in patient care:  Yes (Comment)  Care giving concerns:  No family/friends at bedside.  Patient states that family is caring for other sick family members and just needs update on when patient will discharge back to [facility].   Social Worker assessment / plan:  Holiday representative met with patient at bedside to offer support and discuss patient needs at discharge.  Patient states that she is a resident at [facility] and lives there long term.  Patient is eligible for therapies if she remains inpatient prior to discharge.  CSW spoke with [facility] who states patient is able to return either with or without qualifying stay to justify therapies.  CSW completed FL2 and will follow up with facility once patient is medically stable for discharge.  CSW remains available for support and to facilitate patient discharge needs.  Employment status:  Retired Forensic scientist:  Medicare PT Recommendations:  [place] / Referral to community resources:  [place]  Patient/Family's Response to care:  Patient verbalizes understanding of CSW role and appreciation for support.  Patient agreeable with return to [facility] when medically stable.  Patient/Family's Understanding of and Emotional Response to Diagnosis, Current Treatment, and Prognosis:  Patient understanding of continued barriers and transition to discharge home.  Patient agreeable with SNF placement and states that family has other issues they are tending to at this time.  Emotional Assessment Appearance:  Appears stated age Attitude/Demeanor/Rapport:   (Appropriate and Engaged) Affect (typically observed):  Appropriate, Calm Orientation:  Oriented to Self, Oriented to Place, Oriented to  Time Alcohol / Substance use:  Not Applicable Psych involvement (Current and /or in the community):  No (Comment)  Discharge Needs  Concerns to be addressed:  No discharge needs identified Readmission within the last 30 days:  No Current discharge risk:  None Barriers to Discharge:  Continued Medical Work up (IV Solumedrol)  Barbette Or, [place]

## 2016-12-31 ENCOUNTER — Inpatient Hospital Stay (HOSPITAL_COMMUNITY): Payer: Medicare Other

## 2016-12-31 ENCOUNTER — Encounter (HOSPITAL_COMMUNITY): Payer: Self-pay | Admitting: Internal Medicine

## 2016-12-31 DIAGNOSIS — M1990 Unspecified osteoarthritis, unspecified site: Secondary | ICD-10-CM | POA: Diagnosis present

## 2016-12-31 DIAGNOSIS — I700 Atherosclerosis of aorta: Secondary | ICD-10-CM

## 2016-12-31 DIAGNOSIS — M150 Primary generalized (osteo)arthritis: Secondary | ICD-10-CM

## 2016-12-31 DIAGNOSIS — M5136 Other intervertebral disc degeneration, lumbar region: Secondary | ICD-10-CM | POA: Diagnosis present

## 2016-12-31 HISTORY — DX: Atherosclerosis of aorta: I70.0

## 2016-12-31 LAB — BASIC METABOLIC PANEL
Anion gap: 10 (ref 5–15)
BUN: 22 mg/dL — ABNORMAL HIGH (ref 6–20)
CALCIUM: 9 mg/dL (ref 8.9–10.3)
CHLORIDE: 93 mmol/L — AB (ref 101–111)
CO2: 38 mmol/L — ABNORMAL HIGH (ref 22–32)
CREATININE: 0.83 mg/dL (ref 0.44–1.00)
GFR calc non Af Amer: >60 mL/min (ref >60)
Glucose, Bld: 109 mg/dL — ABNORMAL HIGH (ref 65–99)
Potassium: 4 mmol/L (ref 3.5–5.1)
SODIUM: 141 mmol/L (ref 135–145)

## 2016-12-31 MED ORDER — ASPIRIN 81 MG PO CHEW
81.0000 mg | CHEWABLE_TABLET | Freq: Every day | ORAL | Status: DC
Start: 2016-12-31 — End: 2017-01-01
  Administered 2016-12-31 – 2017-01-01 (×2): 81 mg via ORAL
  Filled 2016-12-31 (×2): qty 1

## 2016-12-31 MED ORDER — POLYETHYLENE GLYCOL 3350 17 G PO PACK
17.0000 g | PACK | Freq: Every day | ORAL | Status: DC
  Administered 2016-12-31 – 2017-01-01 (×2): 17 g via ORAL
  Filled 2016-12-31 (×2): qty 1

## 2016-12-31 MED ORDER — BENZONATATE 100 MG PO CAPS
100.0000 mg | ORAL_CAPSULE | Freq: Three times a day (TID) | ORAL | Status: DC
  Administered 2016-12-31 – 2017-01-01 (×3): 100 mg via ORAL
  Filled 2016-12-31 (×3): qty 1

## 2016-12-31 NOTE — Progress Notes (Signed)
Progress Note    Dawn Montoya  BUL:[id number] DOB: 1932-03-13  DOA: 12/28/2016 PCP: Unice Cobble, MD    Brief Narrative:   Chief complaint: Shortness of breath  Records reviewed and summarized below.  Dawn Montoya is an 83 y.o. female with a PMH of COPD, chronic respiratory failure on 3 L of oxygen, chronic diastolic CHF (last Echo done 09/20/17: EF 32-12%, grade I diastolic dysfunction), stage III CKD (baseline creatinine 0.7-0.8) who was admitted 12/28/16 from ALF with hypoxia (oxygen saturations in the mid 80's). She was treated for a COPD exacerbation 11/24/16  & completed a course of Levaquin and a prednisone taper at that time. CXR showed moderate left pleural effusion and was suggestive of CHF.  Assessment/Plan:   Principal Problems:   Acute on chronic respiratory failure with hypoxemia (HCC)/COPD exacerbation/acute on chronic diastolic CHF Continue empiric Rocephin/azithromycin, albuterol as needed, Brovana, Spiriva, Pulmicort and prednisone. Continue higher dose Lasix, 40 mg IV twice a day. Continue ARB. No need to repeat echocardiogram at this time.Weight down 13 pounds. I/O not accurately recorded. Chest x-ray on admission shows findings consistent with CHF.     Active Problems:   Hypothyroidism Continue Synthroid.    Severe obesity (BMI >= 40) (HCC) Body mass index is 42.51 kg/m.     Stage III chronic kidney disease Creatinine stable.    Hypertension/hypertensive heart disease with acute on chronic diastolic CHF Continue Coreg, Lasix and Cozaar. Weight down about 13 lbs. I/O not accurate.    MRSA carrier Contact precautions.    Sacral pain/osteoarthritis/DDD Skin not red, no wounds. Plain films abnormal with linear lucencies of uncertain etiology, CT of the pelvis unrevealing other than degenerative changes/OA. All films personally reviewed. Stool in rectum (no BM in 3 days).  Will start MiraLax. Sacral pain may be from constipation.    Aortic  Atherosclerosis  Add ASA.  Family Communication/Anticipated D/C date and plan/Code Status   DVT prophylaxis: Lovenox ordered. Code Status: Full Code.  Family Communication: No family at the bedside. Disposition Plan: [facility] when stable, Possibly tomorrow.   Medical Consultants:    None.   Procedures:    None  Anti-Infectives:    Rocephin 12/29/16--->  Azithromycin 12/29/16--->  Subjective:   Continues to complain of cough. Appetite okay. No nausea or vomiting. No chest pain. Breathing is better. Doesn't feel ready to go back to SNF due to sacral pain.  Objective:    Vitals:   12/30/16 2100 12/30/16 2110 12/30/16 2300 12/31/16 0441  BP: (!) 102/40   (!) 139/48  Pulse: 73 65 (!) 58 (!) 58  Resp:    18  Temp: 98.8 F (37.1 C)   98.3 F (36.8 C)  TempSrc: Axillary   Oral  SpO2: 93% 92% 94% 98%  Weight:    108.9 kg (240 lb)  Height:        Intake/Output Summary (Last 24 hours) at 12/31/16 0819 Last data filed at 12/30/16 2108  Gross per 24 hour  Intake               50 ml  Output                0 ml  Net               50 ml   Filed Weights   12/30/16 0050 12/30/16 0458 12/31/16 0441  Weight: 115.1 kg (253 lb 11.2 oz) 115.1 kg (253 lb 11.2 oz) 108.9 kg (240 lb)  Exam: General exam: Appears calm and comfortable. Obese. Respiratory system: Diminished breath sounds throughout, no wheeze. Respiratory effort unlabored. Cardiovascular system: S1 & S2 heard, RRR. No JVD,  rubs, gallops or clicks. No murmurs. Gastrointestinal system: Abdomen is nondistended, soft and nontender. No organomegaly or masses felt. Normal bowel sounds heard. Central nervous system: Alert and oriented. No focal neurological deficits. Extremities: No clubbing,  or cyanosis. 1+ edema. Skin: Venous stasis dermatitis, otherwise no rashes, lesions or ulcers. Psychiatry: Judgement and insight appear normal. Mood & affect appropriate.   Data Reviewed:   I have personally reviewed  following labs and imaging studies:  Labs: Basic Metabolic Panel:  Recent Labs Lab 12/28/16 2250 12/28/16 2318 12/30/16 0423 12/31/16 0403  NA 141 141 142 141  K 4.0 4.0 4.3 4.0  CL 99* 98* 97* 93*  CO2 35*  --  36* 38*  GLUCOSE 147* 144* 119* 109*  BUN 17 21* 16 22*  CREATININE 0.72 0.90 0.74 0.83  CALCIUM 8.7*  --  9.0 9.0   GFR Estimated Creatinine Clearance: 60.8 mL/min (by C-G formula based on SCr of 0.83 mg/dL).  CBC:  Recent Labs Lab 12/28/16 2250 12/28/16 2318  WBC 9.2  --   NEUTROABS 6.6  --   HGB 11.3* 17.0*  HCT 36.9 50.0*  MCV 98.9  --   PLT 277  --    Sepsis Labs:  Recent Labs Lab 12/28/16 2250 12/28/16 2303  WBC 9.2  --   LATICACIDVEN  --  0.99    Microbiology Recent Results (from the past 240 hour(s))  MRSA PCR Screening     Status: Abnormal   Collection Time: 12/29/16  4:08 AM  Result Value Ref Range Status   MRSA by PCR POSITIVE (A) NEGATIVE Final    Comment:        The GeneXpert MRSA Assay (FDA approved for NASAL specimens only), is one component of a comprehensive MRSA colonization surveillance program. It is not intended to diagnose MRSA infection nor to guide or monitor treatment for MRSA infections. RESULT CALLED TO, READ BACK BY AND VERIFIED WITH: C SMITH,RN @0711  12/29/16 MKELLY,MLT     Radiology: Dg Sacrum/coccyx  Result Date: 12/30/2016 CLINICAL DATA:  Tail bone pain when sitting or standing EXAM: SACRUM AND COCCYX - 2+ VIEW COMPARISON:  None. FINDINGS: Left greater than right SI joint arthritis. Pubic symphysis appears intact. Calcified pelvic phleboliths. No gross fracture. Linear lucencies overlie the sacrum on the lateral view which limits evaluation of the sacrum. Moderate severe narrowing at L5-S1 with moderate degenerative changes at L2-L3. IMPRESSION: 1. There are linear lucencies overlying the sacrum on lateral view which limits evaluation of the underlying bone. Uncertain etiology of the linear lucencies, query  gas in the soft tissues. CT pelvis is suggested for further evaluation, the bony sacrum could also be evaluated at that time. Electronically Signed   By: Donavan Foil M.D.   On: 12/30/2016 19:37    Medications:   . acetaminophen  650 mg Oral Q12H  . arformoterol  15 mcg Nebulization BID  . azithromycin  250 mg Oral Daily  . budesonide  0.25 mg Nebulization BID  . carvedilol  3.125 mg Oral BID WC  . cefTRIAXone (ROCEPHIN)  IV  1 g Intravenous QHS  . Chlorhexidine Gluconate Cloth  6 each Topical Q0600  . cholecalciferol  1,000 Units Oral Daily  . cycloSPORINE  1 drop Both Eyes BID  . dextromethorphan-guaiFENesin  1 tablet Oral BID  . diphenhydrAMINE  25 mg  Oral Once  . DULoxetine  60 mg Oral Daily  . enoxaparin (LOVENOX) injection  40 mg Subcutaneous Daily  . furosemide  40 mg Intravenous BID  . gabapentin  300 mg Oral TID  . levothyroxine  75 mcg Oral QAC breakfast  . losartan  12.5 mg Oral Daily  . mupirocin ointment   Nasal BID  . pantoprazole  40 mg Oral Daily  . polyvinyl alcohol  1 drop Both Eyes BID  . predniSONE  40 mg Oral Q breakfast  . sodium chloride flush  3 mL Intravenous Q12H  . tiotropium  18 mcg Inhalation Daily   Continuous Infusions:  Medical decision making is of high complexity and this patient is at high risk of deterioration, therefore this is a level 3 visit.  (> 4 problem points, >4 data points, Moderate risk)  Pt admitted after midnight, so will not charge.   LOS: 2 days   RAMA,CHRISTINA  [facility] Pager [phone]. If unable to reach me by pager, please call my cell phone at [phone].  *Please refer to [email or web address], password [email or web address] to get updated schedule on who will round on this patient, as hospitalists switch teams weekly. If 7PM-7AM, please contact night-coverage at [email or web address], password [email or web address] for any overnight needs.  12/31/2016, 8:19 AM

## 2016-12-31 NOTE — Evaluation (Signed)
Physical Therapy Evaluation Patient Details Name: Dawn Montoya MRN: [id number] DOB: [birth year] Today's Date: 12/31/2016   History of Present Illness  83 y.o.femalewith a PMH of COPD, chronic respiratory failure on 3 L of oxygen, CHF, stage III CKD who was admitted 12/28/16 from ALF with hypoxia.   Clinical Impression  Patient demonstrates deficits in functional mobility as indicated below. Will need continued skilled PT to address deficits and maximize function. Will see as indicated and progress as tolerated.     Follow Up Recommendations SNF    Equipment Recommendations  None recommended by PT    Recommendations for Other Services       Precautions / Restrictions Precautions Precautions: Fall Precaution Comments: watch O2 saturations Restrictions Weight Bearing Restrictions: No      Mobility  Bed Mobility Overal bed mobility: Needs Assistance Bed Mobility: Rolling;Supine to Sit;Sit to Supine Rolling: Supervision   Supine to sit: Min assist Sit to supine: Min assist   General bed mobility comments: Min assist to power up to upright position, increasef effort required, patient with some dizziness in seated position.  Transfers                 General transfer comment: deferred at this time dur to patient anxiety and increased dizziness. BP assessed and stable 120s/70  Ambulation/Gait                Stairs            Wheelchair Mobility    Modified Rankin (Stroke Patients Only)       Balance Overall balance assessment: Needs assistance Sitting-balance support: Feet supported Sitting balance-Leahy Scale: Fair Sitting balance - Comments: able to sit EOB unsupported                                     Pertinent Vitals/Pain Pain Assessment: Faces Faces Pain Scale: Hurts little more Pain Location: abdominal pain Pain Descriptors / Indicators: Sore Pain Intervention(s): Monitored during session    Home Living  Family/patient expects to be discharged to:: Assisted living               Home Equipment: Walker - 2 wheels;Wheelchair - manual      Prior Function Level of Independence: Needs assistance   Gait / Transfers Assistance Needed: uses a w/c for mobility           Hand Dominance   Dominant Hand: Right    Extremity/Trunk Assessment   Upper Extremity Assessment Upper Extremity Assessment: Generalized weakness    Lower Extremity Assessment Lower Extremity Assessment: Generalized weakness    Cervical / Trunk Assessment Cervical / Trunk Assessment:  (increased body habitus)  Communication   Communication: HOH  Cognition Arousal/Alertness: Awake/alert Behavior During Therapy: WFL for tasks assessed/performed Overall Cognitive Status: No family/caregiver present to determine baseline cognitive functioning                      General Comments      Exercises     Assessment/Plan    PT Assessment Patient needs continued PT services  PT Problem List Decreased strength;Decreased balance;Decreased activity tolerance;Decreased mobility;Decreased cognition;Decreased safety awareness;Cardiopulmonary status limiting activity;Pain       PT Treatment Interventions DME instruction;Gait training;Functional mobility training;Therapeutic activities;Therapeutic exercise;Balance training;Cognitive remediation;Patient/family education    PT Goals (Current goals can be found in the Care Plan section)  Acute Rehab PT Goals Patient Stated  Goal: to breathe better PT Goal Formulation: With patient Time For Goal Achievement: 01/14/17 Potential to Achieve Goals: Good    Frequency Min 2X/week   Barriers to discharge        Co-evaluation               End of Session Equipment Utilized During Treatment: Oxygen Activity Tolerance: Patient limited by fatigue;Treatment limited secondary to medical complications (Comment) (dizziness in sitting ) Patient left: in bed;with  call bell/phone within reach (in modified chair position) Nurse Communication: Mobility status PT Visit Diagnosis: Muscle weakness (generalized) (M62.81)         Time: 8144-8185 PT Time Calculation (min) (ACUTE ONLY): 18 min   Charges:   PT Evaluation $PT Eval Moderate Complexity: 1 Procedure     PT G Codes:         Duncan Dull 2017/01/20, 6:15 PM Alben Deeds, [place] DPT  [phone]

## 2017-01-01 LAB — BASIC METABOLIC PANEL
Anion gap: 11 (ref 5–15)
BUN: 22 mg/dL — AB (ref 6–20)
CALCIUM: 9 mg/dL (ref 8.9–10.3)
CO2: 36 mmol/L — AB (ref 22–32)
Chloride: 92 mmol/L — ABNORMAL LOW (ref 101–111)
Creatinine, Ser: 0.76 mg/dL (ref 0.44–1.00)
GFR calc Af Amer: >60 mL/min (ref >60)
GLUCOSE: 94 mg/dL (ref 65–99)
Potassium: 3.4 mmol/L — ABNORMAL LOW (ref 3.5–5.1)
Sodium: 139 mmol/L (ref 135–145)

## 2017-01-01 MED ORDER — POLYETHYLENE GLYCOL 3350 17 G PO PACK: 17.0000 g | PACK | Freq: Every day | ORAL | 0 refills | Status: AC

## 2017-01-01 MED ORDER — OXYCODONE HCL 5 MG PO TABS: 5.0000 mg | ORAL_TABLET | Freq: Four times a day (QID) | ORAL | 0 refills | Status: DC | PRN

## 2017-01-01 MED ORDER — BENZONATATE 100 MG PO CAPS: 100.0000 mg | ORAL_CAPSULE | Freq: Three times a day (TID) | ORAL | 0 refills | Status: DC

## 2017-01-01 MED ORDER — SIMETHICONE 80 MG PO CHEW: 80.0000 mg | CHEWABLE_TABLET | Freq: Four times a day (QID) | ORAL | 0 refills | Status: AC | PRN

## 2017-01-01 MED ORDER — ASPIRIN 81 MG PO CHEW: 81.0000 mg | CHEWABLE_TABLET | Freq: Every day | ORAL | Status: AC

## 2017-01-01 MED ORDER — AZITHROMYCIN 250 MG PO TABS: 250.0000 mg | ORAL_TABLET | Freq: Every day | ORAL | 0 refills | Status: AC

## 2017-01-01 MED ORDER — PREDNISONE 20 MG PO TABS: 40.0000 mg | ORAL_TABLET | Freq: Every day | ORAL | 0 refills | Status: AC

## 2017-01-01 NOTE — Discharge Summary (Signed)
Physician Discharge Summary  Dawn Montoya TGY:[id number] DOB: 1932-09-18 DOA: 12/28/2016  PCP: Unice Cobble, MD  Admit date: 12/28/2016 Discharge date: 01/01/2017  Ad[facility] From: [facility] disposition: [facility]   Recommendations for Outpatient Follow-Up:   1. Monitor daily weight and notify MD if she gains more than 2 lbs/24 hours or 5 lbs in 1 week as her diuretics will need to be adjusted.   Discharge Diagnosis:   Principal Problem:    Acute on chronic respiratory failure with hypoxemia (HCC) Active Problems:    Hypothyroidism    Acute on chronic diastolic CHF (congestive heart failure) (HCC)    Hypertensive heart disease with acute on chronic diastolic congestive heart failure (HCC)    CKD (chronic kidney disease) stage 3, GFR 30-59 ml/min    COPD exacerbation (HCC)    Severe obesity (BMI >= 40) (HCC)    MRSA carrier    Sacral pain    Osteoarthritis    DDD (degenerative disc disease), lumbar    Aortic atherosclerosis ([place])  Discharge Condition: Improved.  Diet recommendation: Low sodium, heart healthy.    History of Present Illness:   Dawn Montoya is an 83 y.o. female with a PMH of COPD, chronic respiratory failure on 3 L of oxygen, chronic diastolic CHF (last Echo done 09/20/17: EF 28-76%, grade I diastolic dysfunction), stage III CKD (baseline creatinine 0.7-0.8) who was admitted 12/28/16 from ALF with hypoxia (oxygen saturations in the mid 80's). She was treated for a COPD exacerbation 11/24/16  & completed a course of Levaquin and a prednisone taper at that time. CXR showed moderate left pleural effusion and was suggestive of CHF.   Hospital Course by Problem:   Principal Problems:   Acute on chronic respiratory failure with hypoxemia (HCC)/COPD exacerbation/acute on chronic diastolic CHF Treated with empiric Rocephin/azithromycin, albuterol as needed, Brovana, Spiriva, Pulmicort and prednisone. Also treated with higher dose Lasix, 40 mg IV  twice a day. Continue ARB. No need to repeat echocardiogram at this time.Weight down 13 pounds since admission. Chest x-ray on admission showed findings consistent with CHF.     Active Problems:   Hypothyroidism Continue Synthroid.    Severe obesity (BMI >= 40) (HCC) Body mass index is 44.36 kg/m.     Stage III chronic kidney disease Creatinine stable.    Hypertension/hypertensive heart disease with acute on chronic diastolic CHF Continue Coreg, Lasix and Cozaar. Weight down about 13 lbs.    MRSA carrier Contact precautions.    Sacral pain/osteoarthritis/DDD Skin not red, no wounds. Plain films abnormal with linear lucencies of uncertain etiology, CT of the pelvis unrevealing other than degenerative changes/OA. All films personally reviewed. Stool in rectum (no BM in 3 days).  Continue MiraLax. Sacral pain improved with treatment ofconstipation.    Aortic Atherosclerosis  Add ASA.    Medical Consultants:    None.   Discharge Exam:   Vitals:   12/31/16 1959 01/01/17 0339  BP:  130/68  Pulse: 66 62  Resp: 18   Temp:  97.8 F (36.6 C)   Vitals:   12/31/16 1300 12/31/16 1952 12/31/16 1959 01/01/17 0339  BP: (!) 124/53 (!) 143/85  130/68  Pulse: 68 71 66 62  Resp: 18  18   Temp: 98.3 F (36.8 C)   97.8 F (36.6 C)  TempSrc: Oral   Oral  SpO2: 94% 94% 95% 90%  Weight:    113.6 kg (250 lb 6.4 oz)  Height:       General exam: Appears calm and  comfortable. Obese. Respiratory system: Diminished breath sounds throughout, no wheeze. Respiratory effort unlabored. Cardiovascular system: S1 & S2 heard, RRR. No JVD,  rubs, gallops or clicks. No murmurs. Gastrointestinal system: Abdomen is nondistended, soft and nontender. No organomegaly or masses felt. Normal bowel sounds heard. Central nervous system: Alert and oriented. No focal neurological deficits. Extremities: No clubbing,  or cyanosis. 1+ edema. Skin: Venous stasis dermatitis, otherwise no rashes,  lesions or ulcers. Psychiatry: Judgement and insight appear normal. Mood & affect appropriate.    The results of significant diagnostics from this hospitalization (including imaging, microbiology, ancillary and laboratory) are listed below for reference.     Procedures and Diagnostic Studies:   Dg Chest 1 View  Result Date: 12/28/2016 CLINICAL DATA:  Dyspnea tonight EXAM: CHEST 1 VIEW COMPARISON:  11/24/2016 FINDINGS: Moderate cardiomegaly. Mild vascular and interstitial prominence. Moderate left pleural effusion. IMPRESSION: Probable congestive heart failure with moderate left pleural effusion. Electronically Signed   By: Andreas Newport M.D.   On: 12/28/2016 22:31   Dg Shoulder Left  Result Date: 12/28/2016 CLINICAL DATA:  Dyspnea and left shoulder pain without trauma. 2 days duration. EXAM: LEFT SHOULDER - 2+ VIEW COMPARISON:  None. FINDINGS: Negative for fracture or dislocation. Moderate degenerative AC joint changes. No bone lesion or bone destruction. IMPRESSION: No acute findings.  Moderate AC degenerative changes. Electronically Signed   By: Andreas Newport M.D.   On: 12/28/2016 22:32     Labs:   Basic Metabolic Panel:  Recent Labs Lab 12/28/16 2250 12/28/16 2318 12/30/16 0423 12/31/16 0403 01/01/17 0636  NA 141 141 142 141 139  K 4.0 4.0 4.3 4.0 3.4*  CL 99* 98* 97* 93* 92*  CO2 35*  --  36* 38* 36*  GLUCOSE 147* 144* 119* 109* 94  BUN 17 21* 16 22* 22*  CREATININE 0.72 0.90 0.74 0.83 0.76  CALCIUM 8.7*  --  9.0 9.0 9.0   GFR Estimated Creatinine Clearance: 64.7 mL/min (by C-G formula based on SCr of 0.76 mg/dL). Liver Function Tests: No results for input(s): AST, ALT, ALKPHOS, BILITOT, PROT, ALBUMIN in the last 168 hours. No results for input(s): LIPASE, AMYLASE in the last 168 hours. No results for input(s): AMMONIA in the last 168 hours. Coagulation profile No results for input(s): INR, PROTIME in the last 168 hours.  CBC:  Recent Labs Lab  12/28/16 2250 12/28/16 2318  WBC 9.2  --   NEUTROABS 6.6  --   HGB 11.3* 17.0*  HCT 36.9 50.0*  MCV 98.9  --   PLT 277  --    Microbiology Recent Results (from the past 240 hour(s))  MRSA PCR Screening     Status: Abnormal   Collection Time: 12/29/16  4:08 AM  Result Value Ref Range Status   MRSA by PCR POSITIVE (A) NEGATIVE Final    Comment:        The GeneXpert MRSA Assay (FDA approved for NASAL specimens only), is one component of a comprehensive MRSA colonization surveillance program. It is not intended to diagnose MRSA infection nor to guide or monitor treatment for MRSA infections. RESULT CALLED TO, READ BACK BY AND VERIFIED WITH: C SMITH,RN @0711  12/29/16 MKELLY,MLT      Discharge Instructions:   Discharge Instructions    (HEART FAILURE PATIENTS) Call MD:  Anytime you have any of the following symptoms: 1) 3 pound weight gain in 24 hours or 5 pounds in 1 week 2) shortness of breath, with or without a dry hacking cough 3) swelling  in the hands, feet or stomach 4) if you have to sleep on extra pillows at night in order to breathe.    Complete by:  As directed    Call MD for:  difficulty breathing, headache or visual disturbances    Complete by:  As directed    Call MD for:  extreme fatigue    Complete by:  As directed    Call MD for:  persistant dizziness or light-headedness    Complete by:  As directed    Call MD for:  temperature >100.4    Complete by:  As directed    Diet - low sodium heart healthy    Complete by:  As directed    Increase activity slowly    Complete by:  As directed      Allergies as of 01/01/2017   No Known Allergies     Medication List    TAKE these medications   acetaminophen 325 MG tablet Commonly known as:  TYLENOL Take 650 mg by mouth every 12 (twelve) hours. Scheduled.   acetaminophen 325 MG tablet Commonly known as:  TYLENOL Take 650 mg by mouth every 6 (six) hours as needed for mild pain.   albuterol 108 (90 Base)  MCG/ACT inhaler Commonly known as:  PROVENTIL HFA;VENTOLIN HFA Inhale 2 puffs into the lungs every 6 (six) hours as needed for wheezing or shortness of breath.   arformoterol 15 MCG/2ML Nebu Commonly known as:  BROVANA Take 2 mLs (15 mcg total) by nebulization 2 (two) times daily.   ARTIFICIAL TEARS OP Apply 1 drop to eye 4 (four) times daily as needed (Both Eyes).   aspirin 81 MG chewable tablet Chew 1 tablet (81 mg total) by mouth daily. Start taking on:  01/02/2017   azithromycin 250 MG tablet Commonly known as:  ZITHROMAX Take 1 tablet (250 mg total) by mouth daily.   benzonatate 100 MG capsule Commonly known as:  TESSALON Take 1 capsule (100 mg total) by mouth 3 (three) times daily.   bisacodyl 10 MG suppository Commonly known as:  DULCOLAX Place 10 mg rectally as needed for moderate constipation.   budesonide 0.25 MG/2ML nebulizer solution Commonly known as:  PULMICORT Take 0.25 mg by nebulization 2 (two) times daily.   carvedilol 3.125 MG tablet Commonly known as:  COREG Take 3.125 mg by mouth 2 (two) times daily with a meal. Hold for HR less than 60 and BP less than 100.   cholecalciferol 1000 units tablet Commonly known as:  VITAMIN D Take 1,000 Units by mouth daily. Reported on 04/11/2016   cycloSPORINE 0.05 % ophthalmic emulsion Commonly known as:  RESTASIS Place 1 drop into both eyes 2 (two) times daily.   DULoxetine 60 MG capsule Commonly known as:  CYMBALTA Take 1 capsule (60 mg total) by mouth daily.   furosemide 40 MG tablet Commonly known as:  LASIX Take 40 mg by mouth daily.   gabapentin 300 MG capsule Commonly known as:  NEURONTIN Take 300 mg by mouth 3 (three) times daily.   levothyroxine 75 MCG tablet Commonly known as:  SYNTHROID, LEVOTHROID Take 75 mcg by mouth daily before breakfast.   magnesium hydroxide 400 MG/5ML suspension Commonly known as:  MILK OF MAGNESIA Take 30 mLs by mouth daily as needed for mild constipation.    Melatonin 3 MG Tabs Take 3 mg by mouth at bedtime.   MUCINEX DM MAXIMUM STRENGTH 60-1200 MG Tb12 Take 1 capsule by mouth 2 (two) times daily. Reported on 04/11/2016  nitroGLYCERIN 0.4 MG SL tablet Commonly known as:  NITROSTAT Place 0.4 mg under the tongue every 5 (five) minutes as needed for chest pain. 3 DOSES MAX   oxyCODONE 5 MG immediate release tablet Commonly known as:  Oxy IR/ROXICODONE Take 1 tablet (5 mg total) by mouth every 6 (six) hours as needed for severe pain.   OXYGEN Inhale into the lungs. Nasal O2 3L/min for hypoxia   pantoprazole 40 MG tablet Commonly known as:  PROTONIX Take 40 mg by mouth daily.   polyethylene glycol packet Commonly known as:  MIRALAX / GLYCOLAX Take 17 g by mouth daily. Start taking on:  01/02/2017   predniSONE 20 MG tablet Commonly known as:  DELTASONE Take 2 tablets (40 mg total) by mouth daily with breakfast.   RA SALINE ENEMA 19-7 GM/118ML Enem Place 1 each rectally as needed (for constipation).   simethicone 80 MG chewable tablet Commonly known as:  MYLICON Chew 1 tablet (80 mg total) by mouth 4 (four) times daily as needed for flatulence.   SYSTANE BALANCE 0.6 % Soln Generic drug:  Propylene Glycol Place 1 drop into both eyes 2 (two) times daily.   tiotropium 18 MCG inhalation capsule Commonly known as:  SPIRIVA HANDIHALER Place 1 capsule (18 mcg total) into inhaler and inhale daily.   valsartan 40 MG tablet Commonly known as:  DIOVAN Take 20 mg by mouth daily.      Follow-up Information    Unice Cobble, MD. Schedule an appointment as soon as possible for a visit in 1 week(s).   Specialty:  Internal Medicine Why:  Hospital follow up. Contact information: [place] [address] [phone]            Time coordinating discharge: 35 minutes.  Signed:  Bitha Fauteux  Pager [phone] [facility] 01/01/2017, 12:18 PM

## 2017-01-01 NOTE — Clinical Social Work Placement (Signed)
   CLINICAL SOCIAL WORK PLACEMENT  NOTE  Date:  01/01/2017  Patient Details  Name: Dawn Montoya MRN: [id number] Date of Birth: 1932-04-11  Clinical Social Work is seeking post-discharge placement for this patient at the [facility] level of care (*CSW will initial, date and re-position this form in  chart as items are completed):  Yes   Patient/family provided with [place] Work Department's list of facilities offering this level of care within the geographic area requested by the patient (or if unable, by the patient's family).  Yes   Patient/family informed of their freedom to choose among providers that offer the needed level of care, that participate in Medicare, Medicaid or managed care program needed by the patient, have an available bed and are willing to accept the patient.  Yes   Patient/family informed of [facility]'s ownership interest in [facility] and [facility], as well as of the fact that they are under no obligation to receive care at these facilities.  PASRR submitted to EDS on       PASRR number received on       Existing PASRR number confirmed on 01/01/17     FL2 transmitted to all facilities in geographic area requested by pt/family on       FL2 transmitted to all facilities within larger geographic area on       Patient informed that his/her managed care company has contracts with or will negotiate with certain facilities, including the following:        Yes   Patient/family informed of bed offers received.  Patient chooses bed at  [facility])     Physician recommends and patient chooses bed at      Patient to be transferred to  [facility]) on 01/01/17.  Patient to be transferred to facility by  [facility])     Patient family notified on 01/01/17 of transfer.  Name of family member notified:  [name]     PHYSICIAN       Additional Comment:    _______________________________________________ Serafina Mitchell,  [place] 01/01/2017, 11:12 AM

## 2017-01-01 NOTE — Clinical Social Work Note (Signed)
Clinical Social Worker facilitated patient discharge including contacting patient family and facility [facility]) to confirm patient discharge plans.  Clinical information faxed to facility and family agreeable with plan.  CSW arranged ambulance transport via [facility] to [place]. RN to call report prior to discharge.  Clinical Social Worker will sign off for now as social work intervention is no longer needed. Please consult [place] again if new need arises.  Jericha Bryden B. Joline Maxcy Clinical Social Work Dept Weekend Social Worker [phone] 11:11 AM

## 2017-01-02 ENCOUNTER — Telehealth: Payer: Self-pay

## 2017-01-02 NOTE — Telephone Encounter (Signed)
This is a patient of [place], who was admitted to [facility] after hospitalization. [facility] F/U is needed. Hospital discharge from [facility] on 3/1//2018.

## 2017-01-03 ENCOUNTER — Other Ambulatory Visit: Payer: Self-pay | Admitting: *Deleted

## 2017-01-03 ENCOUNTER — Non-Acute Institutional Stay (SKILLED_NURSING_FACILITY): Payer: Medicare Other | Admitting: Internal Medicine

## 2017-01-03 ENCOUNTER — Encounter: Payer: Self-pay | Admitting: Internal Medicine

## 2017-01-03 DIAGNOSIS — J9621 Acute and chronic respiratory failure with hypoxia: Secondary | ICD-10-CM

## 2017-01-03 DIAGNOSIS — I5033 Acute on chronic diastolic (congestive) heart failure: Secondary | ICD-10-CM

## 2017-01-03 MED ORDER — OXYCODONE HCL 5 MG PO TABS: 5.0000 mg | ORAL_TABLET | Freq: Four times a day (QID) | ORAL | 0 refills | Status: DC | PRN

## 2017-01-03 NOTE — Patient Instructions (Addendum)
Note: Because of potential respiratory suppression issues and active symptoms of constipation, oxycodone was discontinued. Tramadol was ordered as needed for pain unrelieved by Tylenol Maintenance dose of Tylenol and Tylenol on an as-needed basis doses were adjusted because of the potential for hepatotoxicity with potential dose of 3900 mg daily.

## 2017-01-03 NOTE — Assessment & Plan Note (Signed)
Continue nasal oxygen and pulmonary toilet Routine follow-up with Dr. Spero Curb, Pulmonology as scheduled

## 2017-01-03 NOTE — Progress Notes (Signed)
Facility Location: [facility]  Room Number: 227-B   Code Status:DNR   This is a nursing facility follow up for [facility] readmission within 30 days  Interim medical record and care since last [place] visit was updated with review of diagnostic studies and change in clinical status since last visit were documented.  HPI: The patient was hospitalized 3/7-3/11/18 with acute on chronic respiration failure with hypoxemia. She was admitted from the SNF with significant hypoxia with O2 sats in the mid 80s despite 3 L of nasal oxygen. She was treated aggressively with pulmonary toilet, burst of prednisone, and Levaquin after transition from empiric Rocephin/azithromycin. Chest x-ray revealed moderate left pleural effusion suggesting possible congestive heart failure. She does have a history of chronic diastolic congestive heart failure. Last echo 09/20/17 revealed an ejection fraction of 60-65% with grade 1 diastolic dysfunction. She received higher dose Lasix 40 mg twice a day. Carvedilol and the ARB were continued. She lost 13 pounds during the admission. She also has a history of stage III chronic kidney disease. Review of meds indicates that Tylenol 650 is ordered every 12 hrs on a regular basis with additional dose of 650 every 6 hours as needed for potential total dose of 3900 mg daily.  Review of systems: She denies any symptoms of upper or lower respiratory tract infection at this time other than minor sore throat. She describes a nonproductive cough. She had actually produce a single "drop of blood" but denies any other hemoptysis. She did note some bleeding in the sclera of the left eye but denies other bleeding dyscrasias. She states her edema has improved. She describes lower abdominal discomfort in the context of constipation. She denies any active pain. She does have as necessary oxycodone ordered. PT/OT reports her O2 sats drop in the  80s with ambulation despite nasal oxygen.  Constitutional: No fever,significant weight change, fatigue  Eyes: No redness, discharge, pain, vision changeOther than that noted above.  ENT/mouth: No nasal congestion,  purulent discharge, earache,change in hearing ,sore throat  Cardiovascular: No chest pain, palpitations,paroxysmal nocturnal dyspnea, claudication   Gastrointestinal: No heartburn,dysphagia,abdominal pain, nausea / vomiting,rectal bleeding, melena Genitourinary: No dysuria,hematuria, pyuria,  incontinence, nocturia Musculoskeletal: No joint stiffness, joint swelling, weakness,pain Dermatologic: No rash, pruritus, change in appearance of skin Neurologic: No dizziness,headache,syncope, seizures, numbness , tingling Psychiatric: No significant anxiety , depression, insomnia, anorexia Endocrine: No change in hair/skin/ nails, excessive thirst, excessive hunger, excessive urination  Hematologic/lymphatic: No significant bruising, lymphadenopathy,abnormal bleeding Allergy/immunology: No itchy/ watery eyes, significant sneezing, urticaria, angioedema  Physical exam:  Pertinent or positive findings:Weight excess is present. She has complete dentures. There is localized hemorrhage in the inferior medial left sclera.  She exhibits an intermittent nonproductive rattly cough. Heart rhythm is slightly irregular related to respiratory pattern. Breath sounds are decreased. She does have minor rales at the bases.  she has tense nonpitting edema of the lower extremities. Posterior tibial pulses are decreased.  General appearance:Adequately nourished; no acute distress , increased work of breathing is present.   Lymphatic: No lymphadenopathy about the head, neck, axilla . Eyes: No conjunctival inflammation or lid edema is present. There is no scleral icterus. Ears:  External ear exam shows no significant lesions or deformities.   Nose:  External nasal examination shows no deformity or inflammation.  Nasal mucosa are pink and moist without lesions ,exudates Oral exam: lips and gums are healthy appearing.There is no oropharyngeal erythema or exudate . Neck:  No thyromegaly,  masses, tenderness noted.    Heart:  No murmur, click, rub .  Abdomen:Bowel sounds are normal. Abdomen is soft and nontender with no organomegaly, hernias,masses. GU: deferred  Extremities:  No cyanosis Neurologic exam : In wheelchair  Balance,Rhomberg,finger to nose testing could not be completed due to clinical state Skin: Warm & dry w/o tenting. No significant lesions or rash.   See summary under each active problem in the Problem List with associated updated therapeutic plan

## 2017-01-03 NOTE — Telephone Encounter (Signed)
[facility] [phone] Fax: [phone]  

## 2017-01-03 NOTE — Assessment & Plan Note (Signed)
Continue Lasix 40 mg daily Monitor weights weekly

## 2017-01-25 ENCOUNTER — Non-Acute Institutional Stay (SKILLED_NURSING_FACILITY): Payer: Medicare Other | Admitting: Nurse Practitioner

## 2017-01-25 ENCOUNTER — Encounter: Payer: Self-pay | Admitting: Nurse Practitioner

## 2017-01-25 DIAGNOSIS — K219 Gastro-esophageal reflux disease without esophagitis: Secondary | ICD-10-CM | POA: Diagnosis not present

## 2017-01-25 DIAGNOSIS — J441 Chronic obstructive pulmonary disease with (acute) exacerbation: Secondary | ICD-10-CM | POA: Diagnosis not present

## 2017-01-25 DIAGNOSIS — I110 Hypertensive heart disease with heart failure: Secondary | ICD-10-CM | POA: Diagnosis not present

## 2017-01-25 DIAGNOSIS — E034 Atrophy of thyroid (acquired): Secondary | ICD-10-CM

## 2017-01-25 DIAGNOSIS — I5033 Acute on chronic diastolic (congestive) heart failure: Secondary | ICD-10-CM

## 2017-01-25 NOTE — Progress Notes (Addendum)
Nursing Home Location:  [facility]  Place of Service: SNF (31)  PCP: Unice Cobble, MD  No Known Allergies  Chief Complaint  Patient presents with  . Medical Management of Chronic Issues    Resident is being seen for a routine visit.     HPI:  Patient is a 83 y.o. female seen today at [facility] for routine follow up on chronic conditions. Pt with past medical history of COPD, HTN, hypothyroidism, CHF, and CKD. She was recently admitted and discharged from the hospital for acute respiratory failure with hypoxemia 3/7-3/11. Pt has been doing well, however reports that she was more SOB last night and into this AM. She was receiving her scheduled neb treatment during our encounter. She states that her cough has improved, but remains with auditory wheezes with exertion and at rest. She continues to wear 24/h oxygen, [place] 3L with normal O2 saturations, 96% today. She admits to moderate sputum production, green with no blood present which is baseline. Reports improvement in swelling in lower extremities, and states she has been compliant with her Lasix. Denies fever, chills, mylagias, N/V/D. No reports of chest pain or palpitations.   Denies problems with appetite, sleep, bowel, or bladder. Denies depressive or anxious mood.  Review of Systems:  Review of Systems  Constitutional: Negative for activity change, chills, fatigue and fever.  HENT: Negative for congestion, dental problem (loose dentures) and trouble swallowing.   Eyes:       Dry eyes  Respiratory: Positive for shortness of breath and wheezing. Negative for cough and chest tightness.   Cardiovascular: Negative for chest pain, palpitations and leg swelling.  Gastrointestinal: Negative for abdominal pain, constipation, diarrhea, nausea and vomiting.  Genitourinary: Negative for dysuria, flank pain and frequency (due to diuretic).  Musculoskeletal: Negative for myalgias.  Skin: Negative for wound.  Neurological:  Negative for dizziness, syncope, weakness and light-headedness.  Psychiatric/Behavioral: Negative for confusion. The patient is not nervous/anxious.     Past Medical History:  Diagnosis Date  . Anxiety state 02/25/2015  . Aortic atherosclerosis ([place]) 12/31/2016  . Aortic atherosclerosis ([place]) 12/31/2016  . Breast CA ([place])   . CKD (chronic kidney disease) stage 3, GFR 30-59 ml/min 12/15/2014  . COPD (chronic obstructive pulmonary disease) ([place])   . Diastolic CHF ([place]) 04/28/4491  . GERD without esophagitis 01/19/2016  . Hordeolum externum (stye)    07/01/2016  . HTN (hypertension) 01/18/2014  . Hypertension   . Hypothyroidism 01/18/2014  . Pneumonia   . Recurrent major depression ([place]) 10/03/2016  . Sigmoid diverticulosis   . Spondylosis, cervical, with myelopathy 04/11/2016   Past Surgical History:  Procedure Laterality Date  . BREAST SURGERY    . CHOLECYSTECTOMY    . COLONOSCOPY N/A 01/20/2014   Procedure: COLONOSCOPY;  Surgeon: Winfield Cunas., MD;  Location: [facility];  Service: Endoscopy;  Laterality: N/A;  . ESOPHAGOGASTRODUODENOSCOPY N/A 01/19/2014   Procedure: ESOPHAGOGASTRODUODENOSCOPY (EGD);  Surgeon: Jeryl Columbia, MD;  Location: [facility];  Service: Endoscopy;  Laterality: N/A;  . MASTECTOMY Left    Social History:   reports that she quit smoking about 17 years ago. Her smoking use included Cigarettes. She has a 100.00 pack-year smoking history. She has never used smokeless tobacco. She reports that she does not drink alcohol or use drugs.  Family History  Problem Relation Age of Onset  . Lung cancer Sister   . Breast cancer Sister   . Breast cancer Sister   . Breast cancer  Daughter   . Heart attack Neg Hx     Medications: Patient's Medications  New Prescriptions   No medications on file  Previous Medications   ACETAMINOPHEN (TYLENOL) 325 MG TABLET    Take 650 mg by mouth every 12 (twelve) hours. Scheduled.   ACETAMINOPHEN (TYLENOL) 500 MG TABLET    Take 500  mg by mouth every 6 (six) hours as needed.   ALBUTEROL (PROVENTIL HFA;VENTOLIN HFA) 108 (90 BASE) MCG/ACT INHALER    Inhale 2 puffs into the lungs every 6 (six) hours as needed for wheezing or shortness of breath.   ARFORMOTEROL (BROVANA) 15 MCG/2ML NEBU    Take 2 mLs (15 mcg total) by nebulization 2 (two) times daily.   ASPIRIN 81 MG CHEWABLE TABLET    Chew 1 tablet (81 mg total) by mouth daily.   BENZONATATE (TESSALON) 100 MG CAPSULE    Take 1 capsule (100 mg total) by mouth 3 (three) times daily.   BISACODYL (DULCOLAX) 10 MG SUPPOSITORY    Place 10 mg rectally as needed for moderate constipation.   BUDESONIDE (PULMICORT) 0.25 MG/2ML NEBULIZER SOLUTION    Take 0.25 mg by nebulization 2 (two) times daily.   CARVEDILOL (COREG) 3.125 MG TABLET    Take 3.125 mg by mouth 2 (two) times daily with a meal. Hold for HR less than 60 and BP less than 100.   CHOLECALCIFEROL (VITAMIN D) 1000 UNITS TABLET    Take 1,000 Units by mouth daily. Reported on 04/11/2016   CYCLOSPORINE (RESTASIS) 0.05 % OPHTHALMIC EMULSION    Place 1 drop into both eyes 2 (two) times daily.   DEXTROMETHORPHAN-GUAIFENESIN (MUCINEX DM MAXIMUM STRENGTH) 60-1200 MG TB12    Take 1 capsule by mouth 2 (two) times daily. Reported on 04/11/2016   DULOXETINE (CYMBALTA) 60 MG CAPSULE    Take 1 capsule (60 mg total) by mouth daily.   FUROSEMIDE (LASIX) 40 MG TABLET    Take 40 mg by mouth daily.   GABAPENTIN (NEURONTIN) 300 MG CAPSULE    Take 300 mg by mouth 3 (three) times daily.    HYPROMELLOSE (ARTIFICIAL TEARS OP)    Apply 1 drop to eye 4 (four) times daily as needed (Both Eyes).   LEVOTHYROXINE (SYNTHROID, LEVOTHROID) 75 MCG TABLET    Take 75 mcg by mouth daily before breakfast.   MAGNESIUM HYDROXIDE (MILK OF MAGNESIA) 400 MG/5ML SUSPENSION    Take 30 mLs by mouth daily as needed for mild constipation.   MELATONIN 3 MG TABS    Take 3 mg by mouth at bedtime.    NITROGLYCERIN (NITROSTAT) 0.4 MG SL TABLET    Place 0.4 mg under the tongue every 5  (five) minutes as needed for chest pain. 3 DOSES MAX   OXYGEN    Inhale into the lungs. Nasal O2 3L/min for hypoxia   PANTOPRAZOLE (PROTONIX) 40 MG TABLET    Take 40 mg by mouth daily.    POLYETHYLENE GLYCOL (MIRALAX / GLYCOLAX) PACKET    Take 17 g by mouth daily.   PROPYLENE GLYCOL (SYSTANE BALANCE) 0.6 % SOLN    Place 1 drop into both eyes 2 (two) times daily.   SIMETHICONE (MYLICON) 80 MG CHEWABLE TABLET    Chew 1 tablet (80 mg total) by mouth 4 (four) times daily as needed for flatulence.   SODIUM PHOSPHATES (RA SALINE ENEMA) 19-7 GM/118ML ENEM    Place 1 each rectally as needed (for constipation).   TIOTROPIUM (SPIRIVA HANDIHALER) 18 MCG INHALATION CAPSULE  Place 1 capsule (18 mcg total) into inhaler and inhale daily.   TRAMADOL (ULTRAM) 50 MG TABLET    Take 50 mg by mouth every 8 (eight) hours as needed for severe pain.   VALSARTAN (DIOVAN) 40 MG TABLET    Take 20 mg by mouth daily.  Modified Medications   No medications on file  Discontinued Medications   ACETAMINOPHEN (TYLENOL) 325 MG TABLET    Take 650 mg by mouth every 6 (six) hours as needed for mild pain.   OXYCODONE (OXY IR/ROXICODONE) 5 MG IMMEDIATE RELEASE TABLET    Take 1 tablet (5 mg total) by mouth every 6 (six) hours as needed for severe pain.     Physical Exam: Vitals:   01/25/17 0949  BP: 131/60  Pulse: 72  Resp: 19  Temp: 97.2 F (36.2 C)  SpO2: 96%  Weight: 245 lb (111.1 kg)  Height: 5\' 3"  (1.6 m)    Physical Exam  Constitutional: She is oriented to person, place, and time. She appears well-developed and well-nourished. No distress.  HENT:  Head: Normocephalic and atraumatic.  Mouth/Throat: Oropharynx is clear and moist. No oropharyngeal exudate.  Eyes: Pupils are equal, round, and reactive to light. Right eye exhibits no discharge. Left eye exhibits no discharge.  Neck: Normal range of motion. Neck supple. No JVD present.  Cardiovascular: Normal rate and regular rhythm.  Exam reveals no gallop.   No  murmur heard. Pulses:      Radial pulses are 2+ on the right side, and 2+ on the left side.       Dorsalis pedis pulses are 2+ on the right side, and 2+ on the left side.  Pulmonary/Chest: Tachypnea noted. No respiratory distress. She has decreased breath sounds in the right lower field and the left lower field. She has wheezes in the right upper field, the right middle field, the left upper field and the left middle field. She has rhonchi in the right middle field and the left middle field. She has rales. She exhibits no tenderness.  Diminished throughout, increase RR however was able to carry a conversation, had conversational dyspnea   Abdominal: Soft. Bowel sounds are normal. She exhibits no distension. There is no tenderness.  Musculoskeletal: She exhibits edema (+1 bilaterally).  Lymphadenopathy:    She has no cervical adenopathy.  Neurological: She is alert and oriented to person, place, and time. She is not disoriented. No cranial nerve deficit.  Tremor   Skin: Skin is warm and dry. She is not diaphoretic.  Psychiatric: She has a normal mood and affect.    Labs reviewed: Basic Metabolic Panel:  Recent Labs  09/19/16 0845 09/20/16 0525  12/30/16 0423 12/31/16 0403 01/01/17 0636  NA  --  139  < > 142 141 139  K  --  3.5  < > 4.3 4.0 3.4*  CL  --  93*  < > 97* 93* 92*  CO2  --  34*  < > 36* 38* 36*  GLUCOSE  --  97  < > 119* 109* 94  BUN  --  25*  < > 16 22* 22*  CREATININE  --  0.98  < > 0.74 0.83 0.76  CALCIUM  --  9.3  < > 9.0 9.0 9.0  MG 2.2 2.1  --   --   --   --   PHOS 3.8 4.1  --   --   --   --   < > = values in this interval  not displayed. Liver Function Tests:  Recent Labs  05/15/16 09/19/16 0648 09/20/16 0525  AST 15 21 19   ALT 14 18 19   ALKPHOS 52 64 63  BILITOT  --  0.4 0.7  PROT  --  7.0 7.0  ALBUMIN  --  3.5 3.8   No results for input(s): LIPASE, AMYLASE in the last 8760 hours. No results for input(s): AMMONIA in the last 8760  hours. CBC:  Recent Labs  08/18/16  09/19/16 0648 09/20/16 0525 12/28/16 2250 12/28/16 2318  WBC 9.0  < > 8.5 11.2* 9.2  --   NEUTROABS 6,480  --   --  8.6* 6.6  --   HGB 10.9*  < > 12.5 12.7 11.3* 17.0*  HCT 34*  < > 40.4 40.5 36.9 50.0*  MCV  --   < > 97.1 96.4 98.9  --   PLT 240  < > 268 340 277  --   < > = values in this interval not displayed. TSH: No results for input(s): TSH in the last 8760 hours. A1C: Lab Results  Component Value Date   HGBA1C 5.6 08/18/2016   Lipid Panel: No results for input(s): CHOL, HDL, LDLCALC, TRIG, CHOLHDL, LDLDIRECT in the last 8760 hours. IMPRESSION:  Abnormal MRI cervical spine (without) demonstrating: 1. At C6-7: disc bulging and facet hypertrophy with moderate-severe spinal stenosis, moderate right and severe left foraminal stenosis  2. At C5-6: disc bulging and facet hypertrophy with moderate-severe spinal stenosis and moderate biforaminal stenosis  3. At C4-5: uncovertebral joint hypertrophy with mild biforaminal stenosis  4. At C3-4: uncovertebral joint hypertrophy with mild left foraminal stenosis  5. Patient motion artifact limits this study. Consider repeat scan with sedation if indicated.    Assessment/Plan  1. Acute on chronic diastolic CHF (congestive heart failure) ([place]) - Her recent weight is 257 lb, down from 262lbs on 3/26 however she was 253 lbs on 3/19. furosemide to 40mg  PO daily at this time, will increase to BID x 3 days then resume daily.  Will have pt follow up with cardiologist/CHF clinic  -lower extremity swelling today at 1+ bilaterally -Encouraged to limit sodium and elevated legs when possible   -will get BMP  2. Chronic obstructive pulmonary disease, unspecified COPD type ([place]) - increase in wheezing and shortness of breath.Continue with scheduled breathing treatments, Brovana, Spiriva,and Budesonide -will start duonebs q 6 hours scheduled x 1 week.  -Pt has completed her prednisone taper post  hospital visit, now with increase wheezing. Will increase prednisone to 80 mg daily for 4 days then to decrease to 60 mg daily for 2 days then to resume 40 mg daily  -Continue O2 therapy at 3L [place] -will have staff make Pulmonary appt   3. GERD without esophagitis -Stable, no complaints. Continue with Protonix regimen.  4. Hypothyroidism due to acquired atrophy of thyroid -Continue synthroid 75mg  Po QD -Check TSH with next lab draw  5. Hypertensive heart disease with acute on chronic diastolic congestive heart failure ([place]) -Stable. Valsartan decreased to 20 mg daily at last visit 3/7,  blood pressure currently stable.   Carlos American. Harle Battiest  [facility] [phone] 8 am - 5 pm) [phone] (after hours)

## 2017-01-26 LAB — BASIC METABOLIC PANEL
BUN: 22 mg/dL — AB (ref 4–21)
Creatinine: 0.5 mg/dL (ref 0.5–1.1)
Glucose: 114 mg/dL
Potassium: 4.1 mmol/L (ref 3.4–5.3)
Sodium: 145 mmol/L (ref 137–147)

## 2017-01-26 LAB — HEPATIC FUNCTION PANEL
ALK PHOS: 69 U/L (ref 25–125)
ALT: 21 U/L (ref 7–35)
AST: 13 U/L (ref 13–35)
Bilirubin, Total: 0.5 mg/dL

## 2017-01-26 LAB — CBC AND DIFFERENTIAL
HCT: 43 % (ref 36–46)
HEMOGLOBIN: 13.4 g/dL (ref 12.0–16.0)
PLATELETS: 214 10*3/uL (ref 150–399)
WBC: 9.2 10*3/mL

## 2017-01-26 LAB — TSH: TSH: 2.95 u[IU]/mL (ref 0.41–5.90)

## 2017-01-27 ENCOUNTER — Encounter: Payer: Self-pay | Admitting: Interventional Cardiology

## 2017-01-29 NOTE — Progress Notes (Signed)
Cardiology Office Note   Date:  01/30/2017   ID:  Dawn Montoya, DOB 1932/04/17, MRN [id number]  PCP:  Unice Cobble, MD    No chief complaint on file. Abnormal ECG   Wt Readings from Last 3 Encounters:  01/30/17 247 lb 12.8 oz (112.4 kg)  01/25/17 257 lb (116.6 kg)  01/03/17 250 lb (113.4 kg)       History of Present Illness: Dawn Montoya is a 83 y.o. female  who has chronic diastolic heart failure. She spends most of her time in a wheelchair due to leg problems. She lives at [facility].   She was seen in 2017 and it was noted that she had some ECG changes compared to previous years. She had some intermittent chest discomfort a year ago but this resolved.  Her sx were  Managed medically.  She is weighed every other day. She has not had any recent weight changes or significant leg swelling. Her weight has been stable. She had trouble tolerating higher dosages of diuretics due to issues with incontinence. She is doing better on the lower dose. She is willing to take this.   She had some SHOB and chest pain a few days ago in 4/18.  THis has resolved but she is on 3L/min oxygen Instead of 2 L/m. Her weight has come down. She does not report increased swelling. She did have some chest pain when she was short of breath. It was worse with deep breathing. She understands she needs to avoid excessive salt and may need increased doses of diuretics from time to time.    Past Medical History:  Diagnosis Date  . Anxiety state 02/25/2015  . Aortic atherosclerosis ([place]) 12/31/2016  . Aortic atherosclerosis ([place]) 12/31/2016  . Breast CA ([place])   . CKD (chronic kidney disease) stage 3, GFR 30-59 ml/min 12/15/2014  . COPD (chronic obstructive pulmonary disease) ([place])   . Diastolic CHF ([place]) 01/26/8098  . GERD without esophagitis 01/19/2016  . Hordeolum externum (stye)    07/01/2016  . HTN (hypertension) 01/18/2014  . Hypertension   . Hypothyroidism 01/18/2014  . Pneumonia   . Recurrent major  depression ([place]) 10/03/2016  . Sigmoid diverticulosis   . Spondylosis, cervical, with myelopathy 04/11/2016    Past Surgical History:  Procedure Laterality Date  . BREAST SURGERY    . CHOLECYSTECTOMY    . COLONOSCOPY N/A 01/20/2014   Procedure: COLONOSCOPY;  Surgeon: Winfield Cunas., MD;  Location: [facility];  Service: Endoscopy;  Laterality: N/A;  . ESOPHAGOGASTRODUODENOSCOPY N/A 01/19/2014   Procedure: ESOPHAGOGASTRODUODENOSCOPY (EGD);  Surgeon: Jeryl Columbia, MD;  Location: [facility];  Service: Endoscopy;  Laterality: N/A;  . MASTECTOMY Left      Current Outpatient Prescriptions  Medication Sig Dispense Refill  . albuterol (PROVENTIL HFA;VENTOLIN HFA) 108 (90 Base) MCG/ACT inhaler Inhale 2 puffs into the lungs every 6 (six) hours as needed for wheezing or shortness of breath.    Marland Kitchen arformoterol (BROVANA) 15 MCG/2ML NEBU Take 2 mLs (15 mcg total) by nebulization 2 (two) times daily. 120 mL 5  . aspirin 81 MG chewable tablet Chew 1 tablet (81 mg total) by mouth daily.    . benzonatate (TESSALON) 100 MG capsule Take 1 capsule (100 mg total) by mouth 3 (three) times daily. 20 capsule 0  . bisacodyl (DULCOLAX) 10 MG suppository Place 10 mg rectally as needed for moderate constipation.    . budesonide (PULMICORT) 0.25 MG/2ML nebulizer solution Take 0.25 mg by nebulization 2 (two)  times daily.    . carvedilol (COREG) 3.125 MG tablet Take 3.125 mg by mouth 2 (two) times daily with a meal. Hold for HR less than 60 and BP less than 100.    . cholecalciferol (VITAMIN D) 1000 UNITS tablet Take 1,000 Units by mouth daily. Reported on 04/11/2016    . cycloSPORINE (RESTASIS) 0.05 % ophthalmic emulsion Place 1 drop into both eyes 2 (two) times daily.    Marland Kitchen Dextromethorphan-Guaifenesin (MUCINEX DM MAXIMUM STRENGTH) 60-1200 MG TB12 Take 1 capsule by mouth 2 (two) times daily. Reported on 04/11/2016    . DULoxetine (CYMBALTA) 60 MG capsule Take 1 capsule (60 mg total) by mouth daily.    . furosemide  (LASIX) 40 MG tablet Take 40 mg by mouth daily.    Marland Kitchen gabapentin (NEURONTIN) 300 MG capsule Take 300 mg by mouth 3 (three) times daily.     . Hypromellose (ARTIFICIAL TEARS OP) Apply 1 drop to eye 4 (four) times daily as needed (Both Eyes).    Marland Kitchen levothyroxine (SYNTHROID, LEVOTHROID) 75 MCG tablet Take 75 mcg by mouth daily before breakfast.    . magnesium hydroxide (MILK OF MAGNESIA) 400 MG/5ML suspension Take 30 mLs by mouth daily as needed for mild constipation.    . Melatonin 3 MG TABS Take 3 mg by mouth at bedtime.     . nitroGLYCERIN (NITROSTAT) 0.4 MG SL tablet Place 0.4 mg under the tongue every 5 (five) minutes as needed for chest pain. 3 DOSES MAX    . OXYGEN Inhale into the lungs. Nasal O2 3L/min for hypoxia    . pantoprazole (PROTONIX) 40 MG tablet Take 40 mg by mouth daily.     . polyethylene glycol (MIRALAX / GLYCOLAX) packet Take 17 g by mouth daily. 14 each 0  . predniSONE (DELTASONE) 20 MG tablet Take 40 mg by mouth daily with breakfast.    . Propylene Glycol (SYSTANE BALANCE) 0.6 % SOLN Place 1 drop into both eyes 2 (two) times daily.    . simethicone (MYLICON) 80 MG chewable tablet Chew 1 tablet (80 mg total) by mouth 4 (four) times daily as needed for flatulence. 30 tablet 0  . Sodium Phosphates (RA SALINE ENEMA) 19-7 GM/118ML ENEM Place 1 each rectally as needed (for constipation).    Marland Kitchen tiotropium (SPIRIVA HANDIHALER) 18 MCG inhalation capsule Place 1 capsule (18 mcg total) into inhaler and inhale daily. 30 capsule 12  . traMADol (ULTRAM) 50 MG tablet Take 50 mg by mouth every 8 (eight) hours as needed for severe pain.    . valsartan (DIOVAN) 40 MG tablet Take 20 mg by mouth daily.     No current facility-administered medications for this visit.     Allergies:   Patient has no known allergies.    Social History:  The patient  reports that she quit smoking about 17 years ago. Her smoking use included Cigarettes. She has a 100.00 pack-year smoking history. She has never used  smokeless tobacco. She reports that she does not drink alcohol or use drugs.   Family History:  The patient's family history includes Breast cancer in her daughter, sister, and sister; Lung cancer in her sister.    ROS:  Please see the history of present illness.   Otherwise, review of systems are positive for Occasional swelling and chronic leg pain.   All other systems are reviewed and negative.    PHYSICAL EXAM: VS:  BP 128/76   Pulse 75   Ht 5' 3.5" (1.613 m)  Wt 247 lb 12.8 oz (112.4 kg)   SpO2 95%   BMI 43.21 kg/m  , BMI Body mass index is 43.21 kg/m. GEN: Well nourished, well developed, in no acute distress ; wearing oxygen chronically HEENT: normal  Neck: no JVD, carotid bruits, or masses Cardiac: RRR; no murmurs, rubs, or gallops, trace pretibial bilateral edema  Respiratory:  clear to auscultation bilaterally, normal work of breathing GI: soft, nontender, nondistended, + BS MS: no deformity or atrophy  Skin: warm and dry, no rash Neuro:  Strength and sensation are intact Psych: euthymic mood, full affect   EKG:   The ekg ordered today demonstrates NSR, wavy baseline, nonspecific ST changes -   Recent Labs: 09/20/2016: ALT 19; Magnesium 2.1 12/28/2016: Hemoglobin 17.0; Platelets 277 12/29/2016: B Natriuretic Peptide 121.8 01/01/2017: BUN 22; Creatinine, Ser 0.76; Potassium 3.4; Sodium 139   Lipid Panel    Component Value Date/Time   CHOL 160 12/08/2015   TRIG 181 (A) 12/08/2015   HDL 32 (A) 12/08/2015   LDLCALC 92 12/08/2015     Other studies Reviewed: Additional studies/ records that were reviewed today with results demonstrating: LVH, normal LVEF in 2015.   ASSESSMENT AND PLAN:  1. Chronic diastolic heart failure: She appears euvolemic, but does have a cough and recent chest pain. Continue furosemide. This can be increased to 40 mg BID based on her weight and amount of swelling. Continue daily weights as they are important to judge how much fluid she has  in her system.  Continue salt restriction.  Given her cough, will increase Lasix to 40 mg BID for 2 days, then return back to 40 mg daily. 2. Hypertension: Continue current medications. Blood pressure well controlled. 3. Lower extremity swelling: Elevate legs as much as possible. Continue diuretics as noted above. 4. Chest pain:  Resolved.  Was worse when she was short of breath.  No ischemic workup at this time given her lack of symptoms.  She is not a good candidate for invasive testing at this time.  5. Paperwork accompanied by a signed DO NOT RESUSCITATE form.   Current medicines are reviewed at length with the patient today.  The patient concerns regarding her medicines were addressed.  The following changes have been made:  No change  Labs/ tests ordered today include:  No orders of the defined types were placed in this encounter.   Recommend 150 minutes/week of aerobic exercise Low fat, low carb, high fiber diet recommended  Disposition:   FU when necessary   Signed, Larae Grooms, MD  01/30/2017 9:25 AM    [facility] [address], [place], [place]  [address] Phone: [phone]; Fax: [phone]

## 2017-01-30 ENCOUNTER — Encounter: Payer: Self-pay | Admitting: Interventional Cardiology

## 2017-01-30 ENCOUNTER — Ambulatory Visit (INDEPENDENT_AMBULATORY_CARE_PROVIDER_SITE_OTHER): Payer: Medicare Other | Admitting: Interventional Cardiology

## 2017-01-30 VITALS — BP 128/76 | HR 75 | Ht 63.5 in | Wt 247.8 lb

## 2017-01-30 DIAGNOSIS — R600 Localized edema: Secondary | ICD-10-CM | POA: Diagnosis not present

## 2017-01-30 DIAGNOSIS — I5032 Chronic diastolic (congestive) heart failure: Secondary | ICD-10-CM

## 2017-01-30 DIAGNOSIS — R071 Chest pain on breathing: Secondary | ICD-10-CM

## 2017-01-30 DIAGNOSIS — I10 Essential (primary) hypertension: Secondary | ICD-10-CM | POA: Diagnosis not present

## 2017-01-30 NOTE — Patient Instructions (Signed)
Medication Instructions:  INCREASE LASIX to 40 mg TWICE DAILY for two days then resume Lasix 40 mg daily.  Labwork: None  Testing/Procedures: None  Follow-Up: Your physician recommends that you schedule a follow-up appointment as needed with Dr. Irish Lack.  Any Other Special Instructions Will Be Listed Below (If Applicable).     If you need a refill on your cardiac medications before your next appointment, please call your pharmacy.

## 2017-02-06 ENCOUNTER — Encounter: Payer: Self-pay | Admitting: Adult Health

## 2017-02-06 ENCOUNTER — Ambulatory Visit (INDEPENDENT_AMBULATORY_CARE_PROVIDER_SITE_OTHER): Payer: Medicare Other | Admitting: Adult Health

## 2017-02-06 DIAGNOSIS — J9611 Chronic respiratory failure with hypoxia: Secondary | ICD-10-CM | POA: Diagnosis not present

## 2017-02-06 DIAGNOSIS — J441 Chronic obstructive pulmonary disease with (acute) exacerbation: Secondary | ICD-10-CM | POA: Diagnosis not present

## 2017-02-06 DIAGNOSIS — J9610 Chronic respiratory failure, unspecified whether with hypoxia or hypercapnia: Secondary | ICD-10-CM | POA: Insufficient documentation

## 2017-02-06 NOTE — Progress Notes (Signed)
@Patient  ID: Dawn Montoya, female    DOB: [birth year], 83 y.o.   MRN: [id number]  Chief Complaint  Patient presents with  . Follow-up    COPD     Referring provider: Hendricks Limes, MD  HPI: 83 yo female followed for GOLD D COPD /O2 RF   02/06/2017 Follow up : COPD /O2 RF  Pt presents for follow up for COPD . She was recently admitted last month for COPD exacerbation and decompensated CHF . She was treated with IV abx, steroids and diuresis .  She was discharged on daily prednisone at 40mg  daily.  She is on Spiriva  And Brovana/Pulmicort .  She had flare of COPD on 4/4 and was given prednisone burst with taper to prednisone 40mg  daily and Started on Duoneb every 4hr .  She remains on O2 at 3l/m .      No Known Allergies  Immunization History  Administered Date(s) Administered  . Influenza Split 12/01/2014  . Influenza, High Dose Seasonal PF 09/09/2014  . Influenza,inj,Quad PF,36+ Mos 01/21/2014  . Influenza-Unspecified 08/04/2015, 07/29/2016  . PPD Test 11/14/2014  . Pneumococcal-Unspecified 10/24/2009    Past Medical History:  Diagnosis Date  . Anxiety state 02/25/2015  . Aortic atherosclerosis ([place]) 12/31/2016  . Aortic atherosclerosis ([place]) 12/31/2016  . Breast CA ([place])   . CKD (chronic kidney disease) stage 3, GFR 30-59 ml/min 12/15/2014  . COPD (chronic obstructive pulmonary disease) ([place])   . Diastolic CHF ([place]) 12/27/5730  . GERD without esophagitis 01/19/2016  . Hordeolum externum (stye)    07/01/2016  . HTN (hypertension) 01/18/2014  . Hypertension   . Hypothyroidism 01/18/2014  . Pneumonia   . Recurrent major depression ([place]) 10/03/2016  . Sigmoid diverticulosis   . Spondylosis, cervical, with myelopathy 04/11/2016    Tobacco History: History  Smoking Status  . Former Smoker  . Packs/day: 2.00  . Years: 65.00  . Types: Cigarettes  . Quit date: 10/25/1999  Smokeless Tobacco  . Never Used   Counseling given: Not Answered   Outpatient Encounter  Prescriptions as of 02/06/2017  Medication Sig  . albuterol (PROVENTIL HFA;VENTOLIN HFA) 108 (90 Base) MCG/ACT inhaler Inhale 2 puffs into the lungs every 6 (six) hours as needed for wheezing or shortness of breath.  Marland Kitchen arformoterol (BROVANA) 15 MCG/2ML NEBU Take 2 mLs (15 mcg total) by nebulization 2 (two) times daily.  Marland Kitchen aspirin 81 MG chewable tablet Chew 1 tablet (81 mg total) by mouth daily.  . benzonatate (TESSALON) 100 MG capsule Take 1 capsule (100 mg total) by mouth 3 (three) times daily.  . bisacodyl (DULCOLAX) 10 MG suppository Place 10 mg rectally as needed for moderate constipation.  . budesonide (PULMICORT) 0.25 MG/2ML nebulizer solution Take 0.25 mg by nebulization 2 (two) times daily.  . carvedilol (COREG) 3.125 MG tablet Take 3.125 mg by mouth 2 (two) times daily with a meal. Hold for HR less than 60 and BP less than 100.  . cholecalciferol (VITAMIN D) 1000 UNITS tablet Take 1,000 Units by mouth daily. Reported on 04/11/2016  . cycloSPORINE (RESTASIS) 0.05 % ophthalmic emulsion Place 1 drop into both eyes 2 (two) times daily.  Marland Kitchen Dextromethorphan-Guaifenesin (MUCINEX DM MAXIMUM STRENGTH) 60-1200 MG TB12 Take 1 capsule by mouth 2 (two) times daily. Reported on 04/11/2016  . DULoxetine (CYMBALTA) 60 MG capsule Take 1 capsule (60 mg total) by mouth daily.  . furosemide (LASIX) 40 MG tablet Take 40 mg by mouth daily.  Marland Kitchen gabapentin (NEURONTIN) 300 MG capsule Take  300 mg by mouth 3 (three) times daily.   . Hypromellose (ARTIFICIAL TEARS OP) Apply 1 drop to eye 4 (four) times daily as needed (Both Eyes).  Marland Kitchen levothyroxine (SYNTHROID, LEVOTHROID) 75 MCG tablet Take 75 mcg by mouth daily before breakfast.  . magnesium hydroxide (MILK OF MAGNESIA) 400 MG/5ML suspension Take 30 mLs by mouth daily as needed for mild constipation.  . Melatonin 3 MG TABS Take 3 mg by mouth at bedtime.   . nitroGLYCERIN (NITROSTAT) 0.4 MG SL tablet Place 0.4 mg under the tongue every 5 (five) minutes as needed for  chest pain. 3 DOSES MAX  . OXYGEN Inhale into the lungs. Nasal O2 3L/min for hypoxia  . pantoprazole (PROTONIX) 40 MG tablet Take 40 mg by mouth daily.   . polyethylene glycol (MIRALAX / GLYCOLAX) packet Take 17 g by mouth daily.  . predniSONE (DELTASONE) 20 MG tablet Take 40 mg by mouth daily with breakfast.  . Propylene Glycol (SYSTANE BALANCE) 0.6 % SOLN Place 1 drop into both eyes 2 (two) times daily.  . simethicone (MYLICON) 80 MG chewable tablet Chew 1 tablet (80 mg total) by mouth 4 (four) times daily as needed for flatulence.  . Sodium Phosphates (RA SALINE ENEMA) 19-7 GM/118ML ENEM Place 1 each rectally as needed (for constipation).  Marland Kitchen tiotropium (SPIRIVA HANDIHALER) 18 MCG inhalation capsule Place 1 capsule (18 mcg total) into inhaler and inhale daily.  . traMADol (ULTRAM) 50 MG tablet Take 50 mg by mouth every 8 (eight) hours as needed for severe pain.  . valsartan (DIOVAN) 40 MG tablet Take 20 mg by mouth daily.   No facility-administered encounter medications on file as of 02/06/2017.      Review of Systems  Constitutional:   No  weight loss, night sweats,  Fevers, chills, + fatigue, or  lassitude.  HEENT:   No headaches,  Difficulty swallowing,  Tooth/dental problems, or  Sore throat,                No sneezing, itching, ear ache, nasal congestion, post nasal drip,   CV:  No chest pain,  Orthopnea, PND, swelling in lower extremities, anasarca, dizziness, palpitations, syncope.   GI  No heartburn, indigestion, abdominal pain, nausea, vomiting, diarrhea, change in bowel habits, loss of appetite, bloody stools.   Resp: .  No chest wall deformity  Skin: no rash or lesions.  GU: no dysuria, change in color of urine, no urgency or frequency.  No flank pain, no hematuria   MS:  No joint pain or swelling.  No decreased range of motion.  No back pain.    Physical Exam  BP 124/68 (BP Location: Right Arm, Cuff Size: Large)   Pulse 94   SpO2 90%   GEN: A/Ox3; pleasant ,  NAD, elderly , obese , on O2    HEENT:  [place]/AT,  EACs-clear, TMs-wnl, NOSE-clear, THROAT-clear, no lesions, no postnasal drip or exudate noted. Class 3 MP airway , dentures   NECK:  Supple w/ fair ROM; no JVD; normal carotid impulses w/o bruits; no thyromegaly or nodules palpated; no lymphadenopathy.    RESP  Decreased BS in bases  w/o, wheezes/ rales/ or rhonchi. no accessory muscle use, no dullness to percussion  CARD:  RRR, no m/r/g, no peripheral edema, pulses intact, no cyanosis or clubbing.  GI:   Soft & nt; nml bowel sounds; no organomegaly or masses detected.   Musco: Warm bil, no deformities or joint swelling noted.   Neuro: alert, no focal  deficits noted.    Skin: Warm, no lesions or rashes    Lab Results:   BNP Imaging: No results found.   Assessment & Plan:   COPD exacerbation ([place]) Recent flare , clinically improving .  Would prefer she not be on Spiriva /Duoneb  (will decrease duoneb dose )  Try to taper high dose steroids slowly .  Would like cxr today , pt declined.   Plan  Patient Instructions  Decrease Prednisone 30mg  daily for 1 week then 20mg  daily for 1 week , then 10mg  daily -hold at this doe.  Continue on Brovana and budesonide Neb Twice daily  .  Stop Spiriva .  Change Duoneb every 6hr .  Continue on Oxygen 3l/m .  Follow up with Dr. Lake Bells in 2 months and As needed   Please contact office for sooner follow up if symptoms do not improve or worsen or seek emergency care      Chronic respiratory failure ([place]) Cont on O2      Elster Corbello, NP 02/06/2017

## 2017-02-06 NOTE — Patient Instructions (Addendum)
Decrease Prednisone 30mg  daily for 1 week then 20mg  daily for 1 week , then 10mg  daily -hold at this doe.  Continue on Brovana and budesonide Neb Twice daily  .  Stop Spiriva .  Change Duoneb every 6hr .  Continue on Oxygen 3l/m .  Follow up with Dr. Lake Bells in 2 months and As needed   Please contact office for sooner follow up if symptoms do not improve or worsen or seek emergency care

## 2017-02-06 NOTE — Assessment & Plan Note (Signed)
Cont on O2 .  

## 2017-02-06 NOTE — Assessment & Plan Note (Addendum)
Recent flare , clinically improving .  Would prefer she not be on Spiriva /Duoneb  (will decrease duoneb dose )  Try to taper high dose steroids slowly .  Would like cxr today , pt declined.   Plan  Patient Instructions  Decrease Prednisone 30mg  daily for 1 week then 20mg  daily for 1 week , then 10mg  daily -hold at this doe.  Continue on Brovana and budesonide Neb Twice daily  .  Stop Spiriva .  Change Duoneb every 6hr .  Continue on Oxygen 3l/m .  Follow up with Dr. Lake Bells in 2 months and As needed   Please contact office for sooner follow up if symptoms do not improve or worsen or seek emergency care

## 2017-02-07 NOTE — Progress Notes (Signed)
Reviewed, agree 

## 2017-02-27 NOTE — Progress Notes (Signed)
Reviewed, agree 

## 2017-03-01 ENCOUNTER — Non-Acute Institutional Stay (SKILLED_NURSING_FACILITY): Payer: Medicare Other | Admitting: Nurse Practitioner

## 2017-03-01 ENCOUNTER — Encounter: Payer: Self-pay | Admitting: Nurse Practitioner

## 2017-03-01 DIAGNOSIS — M25512 Pain in left shoulder: Secondary | ICD-10-CM

## 2017-03-01 NOTE — Progress Notes (Signed)
Nursing Home Location:  [facility]  Place of Service: SNF (31)  PCP: Hendricks Limes, MD  No Known Allergies  Chief Complaint  Patient presents with  . Acute Visit    Resident is being seen due to left arm pain.     HPI:  Patient is a 83 y.o. female seen today at [facility] for left shoulder pain. Pt with past medical history of COPD, HTN, hypothyroidism, CHF, and CKD. Pt reports a 10/10 pain to left shoulder. Reports pain started yesterday but was mild but has significantly increased today. Reports pain as an ache/throb. She is wheelchair bound and self propels in her WC. Any movement makes the pain worse. There was no injury or fall.  No warmth or swelling but she reports it feels swollen.   Review of Systems:  Review of Systems  Constitutional: Negative for activity change, fatigue and fever.  Musculoskeletal: Positive for arthralgias, gait problem, joint swelling and myalgias.  Skin: Negative for color change and wound.    Past Medical History:  Diagnosis Date  . Anxiety state 02/25/2015  . Aortic atherosclerosis ([place]) 12/31/2016  . Aortic atherosclerosis ([place]) 12/31/2016  . Breast CA ([place])   . CKD (chronic kidney disease) stage 3, GFR 30-59 ml/min 12/15/2014  . COPD (chronic obstructive pulmonary disease) ([place])   . Diastolic CHF ([place]) 05/24/8562  . GERD without esophagitis 01/19/2016  . Hordeolum externum (stye)    07/01/2016  . HTN (hypertension) 01/18/2014  . Hypertension   . Hypothyroidism 01/18/2014  . Pneumonia   . Recurrent major depression ([place]) 10/03/2016  . Sigmoid diverticulosis   . Spondylosis, cervical, with myelopathy 04/11/2016   Past Surgical History:  Procedure Laterality Date  . BREAST SURGERY    . CHOLECYSTECTOMY    . COLONOSCOPY N/A 01/20/2014   Procedure: COLONOSCOPY;  Surgeon: Winfield Cunas., MD;  Location: [facility];  Service: Endoscopy;  Laterality: N/A;  . ESOPHAGOGASTRODUODENOSCOPY N/A 01/19/2014   Procedure:  ESOPHAGOGASTRODUODENOSCOPY (EGD);  Surgeon: Jeryl Columbia, MD;  Location: [facility];  Service: Endoscopy;  Laterality: N/A;  . MASTECTOMY Left    Social History:   reports that she quit smoking about 17 years ago. Her smoking use included Cigarettes. She has a 130.00 pack-year smoking history. She has never used smokeless tobacco. She reports that she does not drink alcohol or use drugs.  Family History  Problem Relation Age of Onset  . Lung cancer Sister   . Breast cancer Sister   . Breast cancer Sister   . Breast cancer Daughter   . Heart attack Neg Hx     Medications: Patient's Medications  New Prescriptions   No medications on file  Previous Medications   ACETAMINOPHEN (TYLENOL) 500 MG TABLET    Take 500 mg by mouth every 6 (six) hours as needed for mild pain or moderate pain.   ALBUTEROL (PROVENTIL HFA;VENTOLIN HFA) 108 (90 BASE) MCG/ACT INHALER    Inhale 2 puffs into the lungs every 6 (six) hours as needed for wheezing or shortness of breath.   ARFORMOTEROL (BROVANA) 15 MCG/2ML NEBU    Take 2 mLs (15 mcg total) by nebulization 2 (two) times daily.   ASPIRIN 81 MG CHEWABLE TABLET    Chew 1 tablet (81 mg total) by mouth daily.   BENZONATATE (TESSALON) 100 MG CAPSULE    Take 1 capsule (100 mg total) by mouth 3 (three) times daily.   BISACODYL (DULCOLAX) 10 MG SUPPOSITORY    Place 10  mg rectally as needed for moderate constipation.   BUDESONIDE (PULMICORT) 0.25 MG/2ML NEBULIZER SOLUTION    Take 0.25 mg by nebulization 2 (two) times daily.   CARVEDILOL (COREG) 3.125 MG TABLET    Take 3.125 mg by mouth 2 (two) times daily with a meal. Hold for HR less than 60 and BP less than 100.   CHOLECALCIFEROL (VITAMIN D) 1000 UNITS TABLET    Take 1,000 Units by mouth daily. Reported on 04/11/2016   CYCLOSPORINE (RESTASIS) 0.05 % OPHTHALMIC EMULSION    Place 1 drop into both eyes 2 (two) times daily.   DEXTROMETHORPHAN-GUAIFENESIN (MUCINEX DM MAXIMUM STRENGTH) 60-1200 MG TB12    Take 1 capsule by  mouth 2 (two) times daily. Reported on 04/11/2016   DULOXETINE (CYMBALTA) 60 MG CAPSULE    Take 1 capsule (60 mg total) by mouth daily.   FUROSEMIDE (LASIX) 40 MG TABLET    Take 40 mg by mouth daily.   GABAPENTIN (NEURONTIN) 300 MG CAPSULE    Take 300 mg by mouth 3 (three) times daily.    HYPROMELLOSE (ARTIFICIAL TEARS OP)    Apply 1 drop to eye 4 (four) times daily as needed (Both Eyes).   IPRATROPIUM-ALBUTEROL (DUONEB) 0.5-2.5 (3) MG/3ML SOLN    Take 3 mLs by nebulization every 6 (six) hours.   LEVOTHYROXINE (SYNTHROID, LEVOTHROID) 75 MCG TABLET    Take 75 mcg by mouth daily before breakfast.   MAGNESIUM HYDROXIDE (MILK OF MAGNESIA) 400 MG/5ML SUSPENSION    Take 30 mLs by mouth daily as needed for mild constipation.   MELATONIN 3 MG TABS    Take 3 mg by mouth at bedtime.    NITROGLYCERIN (NITROSTAT) 0.4 MG SL TABLET    Place 0.4 mg under the tongue every 5 (five) minutes as needed for chest pain. 3 DOSES MAX   OXYGEN    Inhale into the lungs. Nasal O2 3L/min for hypoxia   PANTOPRAZOLE (PROTONIX) 40 MG TABLET    Take 40 mg by mouth daily.    POLYETHYLENE GLYCOL (MIRALAX / GLYCOLAX) PACKET    Take 17 g by mouth daily.   PREDNISONE (DELTASONE) 10 MG TABLET    Take 10 mg by mouth daily with breakfast.   PROPYLENE GLYCOL (SYSTANE BALANCE) 0.6 % SOLN    Place 1 drop into both eyes 2 (two) times daily.   SIMETHICONE (MYLICON) 80 MG CHEWABLE TABLET    Chew 1 tablet (80 mg total) by mouth 4 (four) times daily as needed for flatulence.   SODIUM PHOSPHATES (RA SALINE ENEMA) 19-7 GM/118ML ENEM    Place 1 each rectally as needed (for constipation).   TRAMADOL (ULTRAM) 50 MG TABLET    Take 50 mg by mouth every 8 (eight) hours as needed for severe pain.   VALSARTAN (DIOVAN) 40 MG TABLET    Take 20 mg by mouth daily.  Modified Medications   No medications on file  Discontinued Medications   PREDNISONE (DELTASONE) 20 MG TABLET    Take 40 mg by mouth daily with breakfast.   TIOTROPIUM (SPIRIVA HANDIHALER) 18  MCG INHALATION CAPSULE    Place 1 capsule (18 mcg total) into inhaler and inhale daily.     Physical Exam: Vitals:   03/01/17 1336  BP: 118/60  Pulse: 91  Resp: (!) 24  Temp: 97 F (36.1 C)  SpO2: 93%  Weight: 255 lb (115.7 kg)  Height: 5\' 3"  (1.6 m)    Physical Exam  Constitutional: She appears well-developed and well-nourished.  Musculoskeletal:  Right shoulder: She exhibits normal range of motion, no tenderness, no bony tenderness, no swelling and no effusion.       Left shoulder: She exhibits decreased range of motion, tenderness, bony tenderness and decreased strength. She exhibits no swelling and no effusion.  Skin: Skin is warm and dry. No rash noted. No erythema.    Labs reviewed: Basic Metabolic Panel:  Recent Labs  09/19/16 0845 09/20/16 0525  12/30/16 0423 12/31/16 0403 01/01/17 0636 01/26/17  NA  --  139  < > 142 141 139 145  K  --  3.5  < > 4.3 4.0 3.4* 4.1  CL  --  93*  < > 97* 93* 92*  --   CO2  --  34*  < > 36* 38* 36*  --   GLUCOSE  --  97  < > 119* 109* 94  --   BUN  --  25*  < > 16 22* 22* 22*  CREATININE  --  0.98  < > 0.74 0.83 0.76 0.5  CALCIUM  --  9.3  < > 9.0 9.0 9.0  --   MG 2.2 2.1  --   --   --   --   --   PHOS 3.8 4.1  --   --   --   --   --   < > = values in this interval not displayed. Liver Function Tests:  Recent Labs  09/19/16 0648 09/20/16 0525 01/26/17  AST 21 19 13   ALT 18 19 21   ALKPHOS 64 63 69  BILITOT 0.4 0.7  --   PROT 7.0 7.0  --   ALBUMIN 3.5 3.8  --    No results for input(s): LIPASE, AMYLASE in the last 8760 hours. No results for input(s): AMMONIA in the last 8760 hours. CBC:  Recent Labs  08/18/16  09/19/16 0648 09/20/16 0525 12/28/16 2250 12/28/16 2318 01/26/17  WBC 9.0  < > 8.5 11.2* 9.2  --  9.2  NEUTROABS 6,480  --   --  8.6* 6.6  --   --   HGB 10.9*  < > 12.5 12.7 11.3* 17.0* 13.4  HCT 34*  < > 40.4 40.5 36.9 50.0* 43  MCV  --   < > 97.1 96.4 98.9  --   --   PLT 240  < > 268 340 277  --   214  < > = values in this interval not displayed. TSH:  Recent Labs  01/26/17  TSH 2.95   A1C: Lab Results  Component Value Date   HGBA1C 5.6 08/18/2016   Lipid Panel: No results for input(s): CHOL, HDL, LDLCALC, TRIG, CHOLHDL, LDLDIRECT in the last 8760 hours. IMPRESSION:  Abnormal MRI cervical spine (without) demonstrating: 1. At C6-7: disc bulging and facet hypertrophy with moderate-severe spinal stenosis, moderate right and severe left foraminal stenosis  2. At C5-6: disc bulging and facet hypertrophy with moderate-severe spinal stenosis and moderate biforaminal stenosis  3. At C4-5: uncovertebral joint hypertrophy with mild biforaminal stenosis  4. At C3-4: uncovertebral joint hypertrophy with mild left foraminal stenosis  5. Patient motion artifact limits this study. Consider repeat scan with sedation if indicated.    Assessment/Plan 1. Acute pain of left shoulder bursitis vs rotator cuff Decrease ROM and increase pain with movement Will give prednisone burst and taper and have OT evaluate and treat as needed To use tramadol PRN pain  Eain Mullendore K. Harle Battiest  [facility] [phone] 8 am -  5 pm) [phone] (after hours)

## 2017-03-14 ENCOUNTER — Encounter: Payer: Self-pay | Admitting: Internal Medicine

## 2017-03-14 ENCOUNTER — Non-Acute Institutional Stay (SKILLED_NURSING_FACILITY): Payer: Medicare Other | Admitting: Internal Medicine

## 2017-03-14 DIAGNOSIS — R410 Disorientation, unspecified: Secondary | ICD-10-CM

## 2017-03-14 DIAGNOSIS — R519 Headache, unspecified: Secondary | ICD-10-CM

## 2017-03-14 DIAGNOSIS — R61 Generalized hyperhidrosis: Secondary | ICD-10-CM

## 2017-03-14 DIAGNOSIS — R6884 Jaw pain: Secondary | ICD-10-CM | POA: Diagnosis not present

## 2017-03-14 DIAGNOSIS — R51 Headache: Secondary | ICD-10-CM

## 2017-03-14 LAB — CBC AND DIFFERENTIAL
HEMATOCRIT: 44 % (ref 36–46)
HEMOGLOBIN: 14.4 g/dL (ref 12.0–16.0)
PLATELETS: 255 10*3/uL (ref 150–399)
WBC: 12.8 10^3/mL

## 2017-03-14 LAB — BASIC METABOLIC PANEL
BUN: 21 mg/dL (ref 4–21)
Creatinine: 0.7 mg/dL (ref 0.5–1.1)
GLUCOSE: 308 mg/dL
Potassium: 5.4 mmol/L — AB (ref 3.4–5.3)
SODIUM: 142 mmol/L (ref 137–147)

## 2017-03-14 NOTE — Progress Notes (Signed)
MRN: [id number]  Facility Location: [facility]  Room Number: 227-B  Code Status: DNR   This is a nursing facility follow up for specific acute issue of pain in the gum and headache.  Interim medical record and care since last Elsa visit was updated with review of diagnostic studies and change in clinical status since last visit were documented.  HPI: Change in condition form stated patient had severe right occipital headache 5/21. She was given Oxycodone with resolution of the headache. Also she describes facial swelling present for 2 weeks. She denies any temporal headache Her history actually varied somewhat. She states that she has had pain in the mandible for several weeks and then stated 5 months. She states that she was seen by the dental specialist 3 months ago. Her present denture was made in 1995 in [place]. She states she's not been wearing the lower denture because hurts to eat. Even without the denture she has pain in the right posterior mandibular area. As noted  her history was somewhat variable and initially she gave the date as 03/10/1917. She corrected it to 2018. She also gave a vague history of having difficulty getting out of bed as she "can get no help". She describes paroxysmal sweating as was occurring when I examined her. She states this is a normal phenomenon for her. She describes chronic shortness of breath but feels it is also stable. She specifically denies any GI or GU symptoms. She has no symptoms of an upper or lower respiratory tract infection. Labs are not current. On 4/5 chemistries, CBC, and TSH were normal.  Review of systems:  Constitutional: No fever,significant weight change, fatigue  Eyes: No redness, discharge, pain, vision change ENT/mouth: No nasal congestion,  purulent discharge, earache,change in hearing ,sore throat  Cardiovascular: No chest pain, palpitations,paroxysmal nocturnal dyspnea,  claudication, edema  Respiratory: No hemoptysis Gastrointestinal: No heartburn,dysphagia,abdominal pain, nausea / vomiting,rectal bleeding, melena,change in bowels Genitourinary: No dysuria,hematuria, pyuria,  incontinence, nocturia Musculoskeletal: No joint stiffness, joint swelling, weakness,pain Dermatologic: No rash, pruritus, change in appearance of skin Neurologic: No dizziness,headache,syncope, seizures, numbness , tingling Hematologic/lymphatic: No significant bruising, lymphadenopathy,abnormal bleeding Allergy/immunology: No itchy/ watery eyes, significant sneezing, urticaria, angioedema  Physical exam:  Pertinent or positive findings: Cushingoid facies suggested. Her cheeks are rouged. She has complete dentures but is not wearing the lower plate. She points to the posterior mandibular gum area as the site of pain. There is no visible lesion there. She has 2 small osteoma anterior to the area of discomfort. She is on nasal oxygen. Breath sounds are markedly decreased and heart sounds are markedly distant. Abdomen is protuberant. Wound over the right inferior shin is bandaged. She has tense edema of the lower extremities. Her skin is cool and damp. Again she validates that this is a recurrent issue for her.   General appearance:Adequately nourished; no acute distress , increased work of breathing is present.   Lymphatic: No lymphadenopathy about the head, neck, axilla . Eyes: No conjunctival inflammation or lid edema is present. There is no scleral icterus. EOM & vision intact Ears:  External ear exam shows no significant lesions or deformities.   Nose:  External nasal examination shows no deformity or inflammation. Nasal mucosa are pink and moist without lesions ,exudates Oral exam: lips and gums are healthy appearing.There is no oropharyngeal erythema or exudate . Neck:  No thyromegaly, masses, tenderness noted.    Heart:  No gallop, murmur, click, rub .  Lungs:without wheezes,  rhonchi,rales , rubs. Abdomen:Bowel sounds are normal. Abdomen is soft and nontender with no organomegaly, hernias,masses. GU: deferred  Extremities:  No cyanosis Skin: Warm & dry w/o tenting. No significant lesions or rash.  #1 gum pain right posterior mandible without visible lesions except for 2 small osteoma anterior to to that area #2 chronic dyspnea, stable according the patient #3 paroxysmal diaphoresis, again recurrent phenomena #4 slight confusion without definite localizing signs .  Although she has no GU symptoms, urine will be collected for UA and C&S because of the confusion and diaphoresis Plan: See orders

## 2017-03-15 NOTE — Patient Instructions (Signed)
See assessment and plan under each diagnosis acutely for this visit  

## 2017-03-17 LAB — HEMOGLOBIN A1C: Hemoglobin A1C: 8.7

## 2017-03-17 LAB — CBC AND DIFFERENTIAL
HCT: 39 % (ref 36–46)
Hemoglobin: 12.5 g/dL (ref 12.0–16.0)
Neutrophils Absolute: 5 /uL
Platelets: 204 10*3/uL (ref 150–399)
WBC: 7.6 10^3/mL

## 2017-03-24 ENCOUNTER — Non-Acute Institutional Stay (SKILLED_NURSING_FACILITY): Payer: Medicare Other | Admitting: Nurse Practitioner

## 2017-03-24 ENCOUNTER — Encounter: Payer: Self-pay | Admitting: Nurse Practitioner

## 2017-03-24 DIAGNOSIS — R600 Localized edema: Secondary | ICD-10-CM | POA: Diagnosis not present

## 2017-03-24 DIAGNOSIS — F4321 Adjustment disorder with depressed mood: Secondary | ICD-10-CM

## 2017-03-24 DIAGNOSIS — E1140 Type 2 diabetes mellitus with diabetic neuropathy, unspecified: Secondary | ICD-10-CM

## 2017-03-24 DIAGNOSIS — I110 Hypertensive heart disease with heart failure: Secondary | ICD-10-CM

## 2017-03-24 DIAGNOSIS — F4320 Adjustment disorder, unspecified: Secondary | ICD-10-CM | POA: Diagnosis not present

## 2017-03-24 DIAGNOSIS — M25512 Pain in left shoulder: Secondary | ICD-10-CM

## 2017-03-24 NOTE — Progress Notes (Signed)
Nursing Home Location:  [facility] Room: [place] of Service: SNF (31)  PCP: Hendricks Limes, MD  No Known Allergies  Chief Complaint  Patient presents with  . Medical Management of Chronic Issues    Resident is being seen for a routine visit.     HPI:  Patient is a 83 y.o. female seen today at [facility] for routine follow up. Pt with past medical history of COPD, HTN, hypothyroidism, CHF, and CKD.  Pt very tearful today reports her daughter passed away a few days ago due to breast cancer. Reports she feel like she is coping effectively and declines additional services to help with grief.  Recent A1c of 8.7 no hx of diabetes Pt has been going to OT due to left shoulder pain. Reports this has been helpful No worsening shortness of breath, cough or congestion.  Review of Systems:  Review of Systems  Constitutional: Negative for activity change, chills, fatigue and fever.       Intermittent fatigue  HENT: Positive for dental problem (loose dentures). Negative for congestion, postnasal drip, rhinorrhea and trouble swallowing.   Eyes:       Dry eyes  Respiratory: Positive for cough. Negative for chest tightness, shortness of breath and wheezing.   Cardiovascular: Negative for chest pain, palpitations and leg swelling.  Gastrointestinal: Negative for abdominal pain, constipation and diarrhea.  Genitourinary: Positive for frequency (due to diuretic). Negative for dysuria and flank pain.  Musculoskeletal: Positive for arthralgias, gait problem, joint swelling and myalgias.  Skin: Negative for color change and wound.  Neurological: Positive for tremors. Negative for dizziness and light-headedness.  Psychiatric/Behavioral: Positive for confusion.       Tearful due to daughter dying    Past Medical History:  Diagnosis Date  . Anxiety state 02/25/2015  . Aortic atherosclerosis ([place]) 12/31/2016  . Aortic atherosclerosis ([place]) 12/31/2016  . Breast CA  ([place])   . CKD (chronic kidney disease) stage 3, GFR 30-59 ml/min 12/15/2014  . COPD (chronic obstructive pulmonary disease) ([place])   . Diastolic CHF ([place]) 11/28/4268  . GERD without esophagitis 01/19/2016  . Hordeolum externum (stye)    07/01/2016  . HTN (hypertension) 01/18/2014  . Hypertension   . Hypothyroidism 01/18/2014  . Pneumonia   . Recurrent major depression ([place]) 10/03/2016  . Sigmoid diverticulosis   . Spondylosis, cervical, with myelopathy 04/11/2016   Past Surgical History:  Procedure Laterality Date  . BREAST SURGERY    . CHOLECYSTECTOMY    . COLONOSCOPY N/A 01/20/2014   Procedure: COLONOSCOPY;  Surgeon: Winfield Cunas., MD;  Location: [facility];  Service: Endoscopy;  Laterality: N/A;  . ESOPHAGOGASTRODUODENOSCOPY N/A 01/19/2014   Procedure: ESOPHAGOGASTRODUODENOSCOPY (EGD);  Surgeon: Jeryl Columbia, MD;  Location: [facility];  Service: Endoscopy;  Laterality: N/A;  . MASTECTOMY Left    Social History:   reports that she quit smoking about 17 years ago. Her smoking use included Cigarettes. She has a 130.00 pack-year smoking history. She has never used smokeless tobacco. She reports that she does not drink alcohol or use drugs.  Family History  Problem Relation Age of Onset  . Lung cancer Sister   . Breast cancer Sister   . Breast cancer Sister   . Breast cancer Daughter   . Heart attack Neg Hx     Medications: Patient's Medications  New Prescriptions   No medications on file  Previous Medications   ACETAMINOPHEN (TYLENOL) 500 MG TABLET  Take 500 mg by mouth every 6 (six) hours as needed for mild pain or moderate pain.   ALBUTEROL (PROVENTIL HFA;VENTOLIN HFA) 108 (90 BASE) MCG/ACT INHALER    Inhale 2 puffs into the lungs every 6 (six) hours as needed for wheezing or shortness of breath.   ARFORMOTEROL (BROVANA) 15 MCG/2ML NEBU    Take 2 mLs (15 mcg total) by nebulization 2 (two) times daily.   ASPIRIN 81 MG CHEWABLE TABLET    Chew 1 tablet (81 mg total) by  mouth daily.   BENZONATATE (TESSALON) 100 MG CAPSULE    Take 1 capsule (100 mg total) by mouth 3 (three) times daily.   BISACODYL (DULCOLAX) 10 MG SUPPOSITORY    Place 10 mg rectally as needed for moderate constipation.   BUDESONIDE (PULMICORT) 0.25 MG/2ML NEBULIZER SOLUTION    Take 0.25 mg by nebulization 2 (two) times daily.   CARVEDILOL (COREG) 3.125 MG TABLET    Take 3.125 mg by mouth 2 (two) times daily with a meal. Hold for HR less than 60 and BP less than 100.   CHOLECALCIFEROL (VITAMIN D) 1000 UNITS TABLET    Take 1,000 Units by mouth daily. Reported on 04/11/2016   CYCLOSPORINE (RESTASIS) 0.05 % OPHTHALMIC EMULSION    Place 1 drop into both eyes 2 (two) times daily.   DEXTROMETHORPHAN-GUAIFENESIN (MUCINEX DM MAXIMUM STRENGTH) 60-1200 MG TB12    Take 1 capsule by mouth 2 (two) times daily. Reported on 04/11/2016   DULOXETINE (CYMBALTA) 60 MG CAPSULE    Take 1 capsule (60 mg total) by mouth daily.   FUROSEMIDE (LASIX) 40 MG TABLET    Take 40 mg by mouth daily.   GABAPENTIN (NEURONTIN) 300 MG CAPSULE    Take 300 mg by mouth 3 (three) times daily.    HYPROMELLOSE (ARTIFICIAL TEARS OP)    Apply 1 drop to eye 4 (four) times daily as needed (Both Eyes).   IPRATROPIUM-ALBUTEROL (DUONEB) 0.5-2.5 (3) MG/3ML SOLN    Take 3 mLs by nebulization every 4 (four) hours as needed.    LEVOTHYROXINE (SYNTHROID, LEVOTHROID) 75 MCG TABLET    Take 75 mcg by mouth daily before breakfast.   MAGNESIUM HYDROXIDE (MILK OF MAGNESIA) 400 MG/5ML SUSPENSION    Take 30 mLs by mouth daily as needed for mild constipation.   MELATONIN 3 MG TABS    Take 3 mg by mouth at bedtime.    NITROGLYCERIN (NITROSTAT) 0.4 MG SL TABLET    Place 0.4 mg under the tongue every 5 (five) minutes as needed for chest pain. 3 DOSES MAX   OXYGEN    Inhale into the lungs. Nasal O2 3L/min for hypoxia   PANTOPRAZOLE (PROTONIX) 40 MG TABLET    Take 40 mg by mouth daily.    POLYETHYLENE GLYCOL (MIRALAX / GLYCOLAX) PACKET    Take 17 g by mouth daily.    PREDNISONE (DELTASONE) 10 MG TABLET    Take 10 mg by mouth daily with breakfast.   PROPYLENE GLYCOL (SYSTANE BALANCE) 0.6 % SOLN    Place 1 drop into both eyes 2 (two) times daily.   SIMETHICONE (MYLICON) 80 MG CHEWABLE TABLET    Chew 1 tablet (80 mg total) by mouth 4 (four) times daily as needed for flatulence.   SODIUM PHOSPHATES (RA SALINE ENEMA) 19-7 GM/118ML ENEM    Place 1 each rectally as needed (for constipation).   TRAMADOL (ULTRAM) 50 MG TABLET    Take 50 mg by mouth every 8 (eight) hours as needed for  severe pain.   VALSARTAN (DIOVAN) 40 MG TABLET    Take 20 mg by mouth daily.  Modified Medications   No medications on file  Discontinued Medications   No medications on file     Physical Exam: Vitals:   03/24/17 1405  BP: 132/69  Pulse: 64  Resp: 20  Temp: 97.2 F (36.2 C)  SpO2: 95%  Weight: 255 lb 9.6 oz (115.9 kg)  Height: 5\' 3"  (1.6 m)    Physical Exam  Constitutional: She appears well-developed and well-nourished. No distress.  HENT:  Head: Normocephalic and atraumatic.  Mouth/Throat: Oropharynx is clear and moist. No oropharyngeal exudate.  Eyes: Pupils are equal, round, and reactive to light. Right eye exhibits no discharge. Left eye exhibits no discharge.  Neck: Normal range of motion. Neck supple. No JVD present.  Cardiovascular: Normal rate and regular rhythm.  Exam reveals no gallop.   No murmur heard. Pulses:      Radial pulses are 2+ on the right side, and 2+ on the left side.       Dorsalis pedis pulses are 2+ on the right side, and 2+ on the left side.  Pulmonary/Chest: Effort normal. No tachypnea. No respiratory distress. She has decreased breath sounds.  Diminished throughout   Abdominal: Soft. Bowel sounds are normal. She exhibits no distension.  Musculoskeletal: She exhibits edema (+1 bilaterally). She exhibits no tenderness.       Right shoulder: She exhibits normal range of motion, no tenderness, no bony tenderness, no swelling and no effusion.         Left shoulder: She exhibits decreased range of motion, bony tenderness and decreased strength. She exhibits no swelling and no effusion.  Lymphadenopathy:    She has no cervical adenopathy.  Neurological: She is alert. No cranial nerve deficit.  Tremor   Skin: Skin is warm and dry. No rash noted. She is not diaphoretic. No erythema.  Psychiatric: She has a normal mood and affect.    Labs reviewed: Basic Metabolic Panel:  Recent Labs  09/19/16 0845 09/20/16 0525  12/30/16 0423 12/31/16 0403 01/01/17 0636 01/26/17 03/14/17  NA  --  139  < > 142 141 139 145 142  K  --  3.5  < > 4.3 4.0 3.4* 4.1 5.4*  CL  --  93*  < > 97* 93* 92*  --   --   CO2  --  34*  < > 36* 38* 36*  --   --   GLUCOSE  --  97  < > 119* 109* 94  --   --   BUN  --  25*  < > 16 22* 22* 22* 21  CREATININE  --  0.98  < > 0.74 0.83 0.76 0.5 0.7  CALCIUM  --  9.3  < > 9.0 9.0 9.0  --   --   MG 2.2 2.1  --   --   --   --   --   --   PHOS 3.8 4.1  --   --   --   --   --   --   < > = values in this interval not displayed. Liver Function Tests:  Recent Labs  09/19/16 0648 09/20/16 0525 01/26/17  AST 21 19 13   ALT 18 19 21   ALKPHOS 64 63 69  BILITOT 0.4 0.7  --   PROT 7.0 7.0  --   ALBUMIN 3.5 3.8  --    No results for input(s): LIPASE,  AMYLASE in the last 8760 hours. No results for input(s): AMMONIA in the last 8760 hours. CBC:  Recent Labs  09/19/16 0648 09/20/16 0525 12/28/16 2250  01/26/17 03/14/17 03/17/17  WBC 8.5 11.2* 9.2  --  9.2 12.8 7.6  NEUTROABS  --  8.6* 6.6  --   --   --  5  HGB 12.5 12.7 11.3*  < > 13.4 14.4 12.5  HCT 40.4 40.5 36.9  < > 43 44 39  MCV 97.1 96.4 98.9  --   --   --   --   PLT 268 340 277  --  214 255 204  < > = values in this interval not displayed. TSH:  Recent Labs  01/26/17  TSH 2.95   A1C: Lab Results  Component Value Date   HGBA1C 8.7 03/17/2017   Lipid Panel: No results for input(s): CHOL, HDL, LDLCALC, TRIG, CHOLHDL, LDLDIRECT in the last 8760  hours. IMPRESSION:  Abnormal MRI cervical spine (without) demonstrating: 1. At C6-7: disc bulging and facet hypertrophy with moderate-severe spinal stenosis, moderate right and severe left foraminal stenosis  2. At C5-6: disc bulging and facet hypertrophy with moderate-severe spinal stenosis and moderate biforaminal stenosis  3. At C4-5: uncovertebral joint hypertrophy with mild biforaminal stenosis  4. At C3-4: uncovertebral joint hypertrophy with mild left foraminal stenosis  5. Patient motion artifact limits this study. Consider repeat scan with sedation if indicated.    Assessment/Plan 1. Type 2 diabetes mellitus with diabetic neuropathy, without long-term current use of insulin ([place]) New diagnosis after blood sugar found to be 306 on BMP, A1c at 8.7. RD to provide diabetic education, will change to diabetic diet. CBG ACHS To start metformin 500 mg daily for 1 week then to increase to twice daily   2. Grief Due to the loss of her daughter yesterday, reports she is very fearful. on call was notified and she was prescribed Ativan as needed for 48 hours which was helpful. Declines counseling at this time.    3. Acute pain of left shoulder Ongoing pain, has improved with OT, conts with therapy at this time  4. Hypertensive heart disease with chronic diastolic congestive heart failure (HCC) Euvolemic, conts on lasix, coreg and valsartan  5. Edema -controlled on lasix.   Carlos American. Harle Battiest  [facility] [phone] 8 am - 5 pm) [phone] (after hours)

## 2017-04-04 ENCOUNTER — Non-Acute Institutional Stay (SKILLED_NURSING_FACILITY): Payer: Medicare Other | Admitting: Internal Medicine

## 2017-04-04 ENCOUNTER — Encounter: Payer: Self-pay | Admitting: Internal Medicine

## 2017-04-04 DIAGNOSIS — Z972 Presence of dental prosthetic device (complete) (partial): Secondary | ICD-10-CM | POA: Diagnosis not present

## 2017-04-04 DIAGNOSIS — K0889 Other specified disorders of teeth and supporting structures: Secondary | ICD-10-CM | POA: Diagnosis not present

## 2017-04-04 DIAGNOSIS — J441 Chronic obstructive pulmonary disease with (acute) exacerbation: Secondary | ICD-10-CM

## 2017-04-04 NOTE — Progress Notes (Signed)
   NURSING HOME LOCATION:  [facility] ROOM NUMBER:  [id number]  CODE STATUS:  DNR  PCP:  Hendricks Limes, MD  [address] [place] [address]  This is a nursing facility follow up for specific acute issue of cough with purulent sputum.  Interim medical record and care since last [place] visit was updated with review of diagnostic studies and change in clinical status since last visit were documented.  HPI: Staff reported some green sputum. Patient states she started coughing several days ago describes sputum as dirty "black". She has no hemoptysis or other bleeding dyscrasias. She states that her shortness of breath is essentially stable.  Her big concern is that she's hungry and can not eat because of pain in the gum of right mandible when she wears the lower plate. She had been seen 5/22 and a topical anesthetic prescribed. Today she says this has not been provided.  Review of systems: History in question as she gave the date as May?, 2018. She knew the president.  Constitutional: No fever,significant weight change, fatigue  Eyes: No redness, discharge, pain, vision change ENT/mouth: No nasal congestion,  purulent discharge, earache,change in hearing ,sore throat  Cardiovascular: No chest pain, palpitations,paroxysmal nocturnal dyspnea, claudication, edema  Gastrointestinal: No heartburn,dysphagia,abdominal pain, nausea / vomiting,rectal bleeding, melena,change in bowels Genitourinary: No dysuria,hematuria, pyuria,  incontinence, nocturia Dermatologic: No rash, pruritus, change in appearance of skin Endocrine: No change in hair/skin/ nails, excessive thirst, excessive hunger, excessive urination  Hematologic/lymphatic: No significant bruising, lymphadenopathy,abnormal bleeding Allergy/immunology: No itchy/ watery eyes, significant sneezing, urticaria, angioedema  Physical exam:  Pertinent or positive findings: She is obese. She is on nasal oxygen. Initially O2 sats  were 90%. Wearing only the upper plate. There is a small mucocele or papule over the right lateral mandible but no visible lesion otherwise. Slight exfoliation over the lower lip. She exhibits an intermittent weak, rattly cough. Heart rhythm and rate are irregular related to respirations. Breath sounds are decreased particularly over the right anterior chest. Abdomen is massive.. All pulses are decreased. She has nonpitting edema.  General appearance:Adequately nourished; no acute distress , increased work of breathing is present.   Lymphatic: No lymphadenopathy about the head, neck, axilla . Eyes: No conjunctival inflammation or lid edema is present. There is no scleral icterus. Ears:  External ear exam shows no significant lesions or deformities.   Nose:  External nasal examination shows no deformity or inflammation. Nasal mucosa are pink and moist without lesions ,exudates Oral exam: lips and gums are healthy appearing.There is no oropharyngeal erythema or exudate . Neck:  No thyromegaly, masses, tenderness noted.    Heart:  No gallop, murmur, click, rub .  Lungs:without wheezes, rhonchi,rales , rubs. Abdomen:Bowel sounds are normal. Abdomen is soft and nontender with no organomegaly, hernias,masses. GU: deferred  Extremities:  No cyanosis, clubbing  Neurologic exam : Strength decreased but essentially equal  in upper & lower extremities. She requires help to sit up Skin: Cool but dry w/o tenting. No significant lesions or rash.  See summary under each active problem in the Problem List with associated updated therapeutic plan

## 2017-04-04 NOTE — Assessment & Plan Note (Signed)
Increase prednisone to twice a day 5 days Azithromycin course Continue aggressive pulmonary toilet

## 2017-04-04 NOTE — Assessment & Plan Note (Addendum)
Dental follow-up as soon as possible Topical anesthetic reordered

## 2017-04-05 ENCOUNTER — Emergency Department (HOSPITAL_COMMUNITY): Payer: Medicare Other

## 2017-04-05 ENCOUNTER — Inpatient Hospital Stay (HOSPITAL_COMMUNITY)
Admission: EM | Admit: 2017-04-05 | Discharge: 2017-04-10 | DRG: 871 | Disposition: A | Discharge status: Skilled Nursing Facility | Payer: Medicare Other | Attending: Internal Medicine | Admitting: Internal Medicine

## 2017-04-05 ENCOUNTER — Encounter (HOSPITAL_COMMUNITY): Payer: Self-pay | Admitting: Emergency Medicine

## 2017-04-05 DIAGNOSIS — N183 Chronic kidney disease, stage 3 unspecified: Secondary | ICD-10-CM | POA: Diagnosis present

## 2017-04-05 DIAGNOSIS — Z79899 Other long term (current) drug therapy: Secondary | ICD-10-CM

## 2017-04-05 DIAGNOSIS — J9601 Acute respiratory failure with hypoxia: Secondary | ICD-10-CM | POA: Diagnosis present

## 2017-04-05 DIAGNOSIS — R6520 Severe sepsis without septic shock: Secondary | ICD-10-CM | POA: Diagnosis present

## 2017-04-05 DIAGNOSIS — I130 Hypertensive heart and chronic kidney disease with heart failure and stage 1 through stage 4 chronic kidney disease, or unspecified chronic kidney disease: Secondary | ICD-10-CM | POA: Diagnosis present

## 2017-04-05 DIAGNOSIS — J189 Pneumonia, unspecified organism: Secondary | ICD-10-CM | POA: Diagnosis not present

## 2017-04-05 DIAGNOSIS — G629 Polyneuropathy, unspecified: Secondary | ICD-10-CM | POA: Diagnosis present

## 2017-04-05 DIAGNOSIS — Z9981 Dependence on supplemental oxygen: Secondary | ICD-10-CM

## 2017-04-05 DIAGNOSIS — J9621 Acute and chronic respiratory failure with hypoxia: Secondary | ICD-10-CM | POA: Diagnosis present

## 2017-04-05 DIAGNOSIS — J440 Chronic obstructive pulmonary disease with acute lower respiratory infection: Secondary | ICD-10-CM | POA: Diagnosis present

## 2017-04-05 DIAGNOSIS — Z87891 Personal history of nicotine dependence: Secondary | ICD-10-CM | POA: Diagnosis not present

## 2017-04-05 DIAGNOSIS — A419 Sepsis, unspecified organism: Secondary | ICD-10-CM | POA: Diagnosis present

## 2017-04-05 DIAGNOSIS — Z6841 Body Mass Index (BMI) 40.0 and over, adult: Secondary | ICD-10-CM

## 2017-04-05 DIAGNOSIS — Y95 Nosocomial condition: Secondary | ICD-10-CM | POA: Diagnosis present

## 2017-04-05 DIAGNOSIS — I5032 Chronic diastolic (congestive) heart failure: Secondary | ICD-10-CM | POA: Diagnosis present

## 2017-04-05 DIAGNOSIS — J9602 Acute respiratory failure with hypercapnia: Secondary | ICD-10-CM

## 2017-04-05 DIAGNOSIS — Z7952 Long term (current) use of systemic steroids: Secondary | ICD-10-CM

## 2017-04-05 DIAGNOSIS — J441 Chronic obstructive pulmonary disease with (acute) exacerbation: Secondary | ICD-10-CM | POA: Diagnosis present

## 2017-04-05 DIAGNOSIS — E039 Hypothyroidism, unspecified: Secondary | ICD-10-CM | POA: Diagnosis present

## 2017-04-05 DIAGNOSIS — Z66 Do not resuscitate: Secondary | ICD-10-CM | POA: Diagnosis present

## 2017-04-05 DIAGNOSIS — G9341 Metabolic encephalopathy: Secondary | ICD-10-CM | POA: Diagnosis present

## 2017-04-05 DIAGNOSIS — D518 Other vitamin B12 deficiency anemias: Secondary | ICD-10-CM | POA: Diagnosis not present

## 2017-04-05 DIAGNOSIS — F339 Major depressive disorder, recurrent, unspecified: Secondary | ICD-10-CM | POA: Diagnosis present

## 2017-04-05 DIAGNOSIS — J9622 Acute and chronic respiratory failure with hypercapnia: Secondary | ICD-10-CM | POA: Diagnosis present

## 2017-04-05 LAB — COMPREHENSIVE METABOLIC PANEL
ALBUMIN: 3.3 g/dL — AB (ref 3.5–5.0)
ALK PHOS: 81 U/L (ref 38–126)
ALT: 16 U/L (ref 14–54)
AST: 20 U/L (ref 15–41)
Anion gap: 10 (ref 5–15)
BILIRUBIN TOTAL: 1.5 mg/dL — AB (ref 0.3–1.2)
BUN: 14 mg/dL (ref 6–20)
CALCIUM: 8.8 mg/dL — AB (ref 8.9–10.3)
CO2: 30 mmol/L (ref 22–32)
CREATININE: 0.75 mg/dL (ref 0.44–1.00)
Chloride: 97 mmol/L — ABNORMAL LOW (ref 101–111)
GFR calc Af Amer: >60 mL/min (ref >60)
GFR calc non Af Amer: >60 mL/min (ref >60)
GLUCOSE: 231 mg/dL — AB (ref 65–99)
POTASSIUM: 4.5 mmol/L (ref 3.5–5.1)
Sodium: 137 mmol/L (ref 135–145)
Total Protein: 6.5 g/dL (ref 6.5–8.1)

## 2017-04-05 LAB — I-STAT ARTERIAL BLOOD GAS, ED
Acid-Base Excess: 7 mmol/L — ABNORMAL HIGH (ref 0.0–2.0)
BICARBONATE: 35.2 mmol/L — AB (ref 20.0–28.0)
O2 Saturation: 94 %
PCO2 ART: 67 mmHg — AB (ref 32.0–48.0)
PO2 ART: 79 mmHg — AB (ref 83.0–108.0)
TCO2: 37 mmol/L (ref 0–100)
pH, Arterial: 7.329 — ABNORMAL LOW (ref 7.350–7.450)

## 2017-04-05 LAB — CBC WITH DIFFERENTIAL/PLATELET
BASOS ABS: 0 10*3/uL (ref 0.0–0.1)
Basophils Relative: 0 %
Eosinophils Absolute: 0 10*3/uL (ref 0.0–0.7)
Eosinophils Relative: 0 %
HEMATOCRIT: 40.4 % (ref 36.0–46.0)
Hemoglobin: 12.6 g/dL (ref 12.0–15.0)
LYMPHS ABS: 0.7 10*3/uL (ref 0.7–4.0)
LYMPHS PCT: 6 %
MCH: 31.3 pg (ref 26.0–34.0)
MCHC: 31.2 g/dL (ref 30.0–36.0)
MCV: 100.2 fL — AB (ref 78.0–100.0)
Monocytes Absolute: 0.7 10*3/uL (ref 0.1–1.0)
Monocytes Relative: 6 %
NEUTROS ABS: 11 10*3/uL — AB (ref 1.7–7.7)
Neutrophils Relative %: 88 %
Platelets: 239 10*3/uL (ref 150–400)
RBC: 4.03 MIL/uL (ref 3.87–5.11)
RDW: 15.1 % (ref 11.5–15.5)
WBC: 12.4 10*3/uL — ABNORMAL HIGH (ref 4.0–10.5)

## 2017-04-05 LAB — GLUCOSE, CAPILLARY: Glucose-Capillary: 251 mg/dL — ABNORMAL HIGH (ref 65–99)

## 2017-04-05 LAB — I-STAT TROPONIN, ED: Troponin i, poc: 0.01 ng/mL (ref 0.00–0.08)

## 2017-04-05 LAB — BRAIN NATRIURETIC PEPTIDE: B NATRIURETIC PEPTIDE 5: 140.2 pg/mL — AB (ref 0.0–100.0)

## 2017-04-05 LAB — I-STAT CG4 LACTIC ACID, ED: Lactic Acid, Venous: 1.64 mmol/L (ref 0.5–1.9)

## 2017-04-05 MED ORDER — BUDESONIDE 0.25 MG/2ML IN SUSP
0.2500 mg | Freq: Two times a day (BID) | RESPIRATORY_TRACT | Status: DC
  Administered 2017-04-05 – 2017-04-10 (×10): 0.25 mg via RESPIRATORY_TRACT
  Filled 2017-04-05 (×10): qty 2

## 2017-04-05 MED ORDER — ONDANSETRON HCL 4 MG/2ML IJ SOLN
4.0000 mg | Freq: Four times a day (QID) | INTRAMUSCULAR | Status: DC | PRN
  Administered 2017-04-08: 4 mg via INTRAVENOUS
  Filled 2017-04-05: qty 2

## 2017-04-05 MED ORDER — METHYLPREDNISOLONE SODIUM SUCC 125 MG IJ SOLR
60.0000 mg | Freq: Two times a day (BID) | INTRAMUSCULAR | Status: DC
  Administered 2017-04-05 – 2017-04-07 (×4): 60 mg via INTRAVENOUS
  Filled 2017-04-05 (×4): qty 2

## 2017-04-05 MED ORDER — VANCOMYCIN HCL IN DEXTROSE 750-5 MG/150ML-% IV SOLN
750.0000 mg | Freq: Two times a day (BID) | INTRAVENOUS | Status: DC
  Administered 2017-04-06: 750 mg via INTRAVENOUS
  Filled 2017-04-05 (×2): qty 150

## 2017-04-05 MED ORDER — VANCOMYCIN HCL IN DEXTROSE 1-5 GM/200ML-% IV SOLN
1000.0000 mg | INTRAVENOUS | Status: AC
  Administered 2017-04-05: 1000 mg via INTRAVENOUS
  Filled 2017-04-05: qty 200

## 2017-04-05 MED ORDER — MAGNESIUM HYDROXIDE 400 MG/5ML PO SUSP: 30.0000 mL | Freq: Once | ORAL | Status: DC | PRN

## 2017-04-05 MED ORDER — POLYETHYLENE GLYCOL 3350 17 G PO PACK
17.0000 g | PACK | Freq: Every day | ORAL | Status: DC
  Administered 2017-04-05 – 2017-04-09 (×5): 17 g via ORAL
  Filled 2017-04-05 (×5): qty 1

## 2017-04-05 MED ORDER — FUROSEMIDE 10 MG/ML IJ SOLN: 40.0000 mg | Freq: Once | INTRAMUSCULAR | Status: DC

## 2017-04-05 MED ORDER — GUAIFENESIN-DM 100-10 MG/5ML PO SYRP: 5.0000 mL | ORAL_SOLUTION | ORAL | Status: DC | PRN

## 2017-04-05 MED ORDER — ONDANSETRON HCL 4 MG PO TABS: 4.0000 mg | ORAL_TABLET | Freq: Four times a day (QID) | ORAL | Status: DC | PRN

## 2017-04-05 MED ORDER — ASPIRIN 81 MG PO CHEW
81.0000 mg | CHEWABLE_TABLET | Freq: Every day | ORAL | Status: DC
  Administered 2017-04-05 – 2017-04-10 (×6): 81 mg via ORAL
  Filled 2017-04-05 (×6): qty 1

## 2017-04-05 MED ORDER — FUROSEMIDE 40 MG PO TABS
40.0000 mg | ORAL_TABLET | Freq: Every day | ORAL | Status: DC
  Administered 2017-04-05 – 2017-04-10 (×6): 40 mg via ORAL
  Filled 2017-04-05 (×6): qty 1

## 2017-04-05 MED ORDER — ALBUTEROL SULFATE (2.5 MG/3ML) 0.083% IN NEBU: 2.5000 mg | INHALATION_SOLUTION | RESPIRATORY_TRACT | Status: DC | PRN

## 2017-04-05 MED ORDER — SIMETHICONE 80 MG PO CHEW: 80.0000 mg | CHEWABLE_TABLET | Freq: Four times a day (QID) | ORAL | Status: DC | PRN

## 2017-04-05 MED ORDER — BENZOCAINE 10 % MT GEL
1.0000 "application " | Freq: Three times a day (TID) | OROMUCOSAL | Status: DC
  Administered 2017-04-06 – 2017-04-10 (×14): 1 via OROMUCOSAL
  Filled 2017-04-05 (×2): qty 9

## 2017-04-05 MED ORDER — IPRATROPIUM-ALBUTEROL 0.5-2.5 (3) MG/3ML IN SOLN
3.0000 mL | RESPIRATORY_TRACT | Status: DC
  Administered 2017-04-05 – 2017-04-07 (×11): 3 mL via RESPIRATORY_TRACT
  Filled 2017-04-05 (×11): qty 3

## 2017-04-05 MED ORDER — CYCLOSPORINE 0.05 % OP EMUL
1.0000 [drp] | Freq: Two times a day (BID) | OPHTHALMIC | Status: DC
  Administered 2017-04-05 – 2017-04-10 (×8): 1 [drp] via OPHTHALMIC
  Filled 2017-04-05 (×10): qty 1

## 2017-04-05 MED ORDER — IRBESARTAN 75 MG PO TABS
37.5000 mg | ORAL_TABLET | Freq: Every day | ORAL | Status: DC
  Administered 2017-04-05: 37.5 mg via ORAL
  Administered 2017-04-06: 11:00:00 via ORAL
  Administered 2017-04-07 – 2017-04-10 (×4): 37.5 mg via ORAL
  Filled 2017-04-05 (×7): qty 0.5

## 2017-04-05 MED ORDER — CARVEDILOL 3.125 MG PO TABS
3.1250 mg | ORAL_TABLET | Freq: Two times a day (BID) | ORAL | Status: DC
  Administered 2017-04-06 – 2017-04-10 (×9): 3.125 mg via ORAL
  Filled 2017-04-05 (×9): qty 1

## 2017-04-05 MED ORDER — ENOXAPARIN SODIUM 40 MG/0.4ML ~~LOC~~ SOLN
40.0000 mg | SUBCUTANEOUS | Status: DC
  Administered 2017-04-05 – 2017-04-06 (×2): 40 mg via SUBCUTANEOUS
  Filled 2017-04-05 (×2): qty 0.4

## 2017-04-05 MED ORDER — ACETAMINOPHEN 650 MG RE SUPP: 650.0000 mg | Freq: Four times a day (QID) | RECTAL | Status: DC | PRN

## 2017-04-05 MED ORDER — DEXTROSE 5 % IV SOLN
1.0000 g | Freq: Three times a day (TID) | INTRAVENOUS | Status: DC
  Administered 2017-04-06 – 2017-04-08 (×8): 1 g via INTRAVENOUS
  Filled 2017-04-05 (×11): qty 1

## 2017-04-05 MED ORDER — VITAMIN D 1000 UNITS PO TABS
1000.0000 [IU] | ORAL_TABLET | Freq: Every day | ORAL | Status: DC
  Administered 2017-04-06 – 2017-04-10 (×5): 1000 [IU] via ORAL
  Filled 2017-04-05 (×5): qty 1

## 2017-04-05 MED ORDER — SODIUM CHLORIDE 0.9% FLUSH
3.0000 mL | Freq: Two times a day (BID) | INTRAVENOUS | Status: DC
  Administered 2017-04-06 – 2017-04-10 (×9): 3 mL via INTRAVENOUS

## 2017-04-05 MED ORDER — NITROGLYCERIN 0.4 MG SL SUBL: 0.4000 mg | SUBLINGUAL_TABLET | SUBLINGUAL | Status: DC | PRN

## 2017-04-05 MED ORDER — PROPYLENE GLYCOL 0.6 % OP SOLN: 1.0000 [drp] | Freq: Two times a day (BID) | OPHTHALMIC | Status: DC

## 2017-04-05 MED ORDER — LEVOTHYROXINE SODIUM 75 MCG PO TABS
75.0000 ug | ORAL_TABLET | Freq: Every day | ORAL | Status: DC
  Administered 2017-04-06 – 2017-04-10 (×5): 75 ug via ORAL
  Filled 2017-04-05 (×5): qty 1

## 2017-04-05 MED ORDER — ARFORMOTEROL TARTRATE 15 MCG/2ML IN NEBU
15.0000 ug | INHALATION_SOLUTION | Freq: Two times a day (BID) | RESPIRATORY_TRACT | Status: DC
  Administered 2017-04-05 – 2017-04-10 (×10): 15 ug via RESPIRATORY_TRACT
  Filled 2017-04-05 (×10): qty 2

## 2017-04-05 MED ORDER — PANTOPRAZOLE SODIUM 40 MG PO TBEC
40.0000 mg | DELAYED_RELEASE_TABLET | Freq: Every day | ORAL | Status: DC
  Administered 2017-04-06 – 2017-04-10 (×5): 40 mg via ORAL
  Filled 2017-04-05 (×5): qty 1

## 2017-04-05 MED ORDER — PIPERACILLIN-TAZOBACTAM 3.375 G IVPB 30 MIN
3.3750 g | Freq: Once | INTRAVENOUS | Status: AC
  Administered 2017-04-05: 3.375 g via INTRAVENOUS
  Filled 2017-04-05: qty 50

## 2017-04-05 MED ORDER — DM-GUAIFENESIN ER 30-600 MG PO TB12
2.0000 | ORAL_TABLET | Freq: Two times a day (BID) | ORAL | Status: DC
  Administered 2017-04-05 – 2017-04-10 (×10): 2 via ORAL
  Filled 2017-04-05 (×9): qty 2
  Filled 2017-04-05: qty 1

## 2017-04-05 MED ORDER — MUCINEX DM MAXIMUM STRENGTH 60-1200 MG PO TB12
1.0000 | ORAL_TABLET | Freq: Two times a day (BID) | ORAL | Status: DC
Start: 2017-04-05 — End: 2017-04-05

## 2017-04-05 MED ORDER — BISACODYL 10 MG RE SUPP
10.0000 mg | Freq: Every day | RECTAL | Status: DC | PRN
Start: 2017-04-05 — End: 2017-04-10

## 2017-04-05 MED ORDER — INSULIN ASPART 100 UNIT/ML ~~LOC~~ SOLN
0.0000 [IU] | Freq: Three times a day (TID) | SUBCUTANEOUS | Status: DC
  Administered 2017-04-06: 5 [IU] via SUBCUTANEOUS
  Administered 2017-04-06: 3 [IU] via SUBCUTANEOUS
  Administered 2017-04-06: 9 [IU] via SUBCUTANEOUS
  Administered 2017-04-07 (×3): 3 [IU] via SUBCUTANEOUS
  Administered 2017-04-08 (×2): 2 [IU] via SUBCUTANEOUS
  Administered 2017-04-08: 1 [IU] via SUBCUTANEOUS
  Administered 2017-04-09 (×2): 3 [IU] via SUBCUTANEOUS
  Administered 2017-04-09 – 2017-04-10 (×3): 2 [IU] via SUBCUTANEOUS

## 2017-04-05 MED ORDER — BENZONATATE 100 MG PO CAPS
100.0000 mg | ORAL_CAPSULE | Freq: Three times a day (TID) | ORAL | Status: DC
  Administered 2017-04-05 – 2017-04-10 (×14): 100 mg via ORAL
  Filled 2017-04-05 (×14): qty 1

## 2017-04-05 MED ORDER — ALBUTEROL SULFATE HFA 108 (90 BASE) MCG/ACT IN AERS: 2.0000 | INHALATION_SPRAY | Freq: Four times a day (QID) | RESPIRATORY_TRACT | Status: DC | PRN

## 2017-04-05 MED ORDER — HYPROMELLOSE (GONIOSCOPIC) 2.5 % OP SOLN
1.0000 [drp] | Freq: Two times a day (BID) | OPHTHALMIC | Status: DC
  Administered 2017-04-05 – 2017-04-10 (×3): 1 [drp] via OPHTHALMIC
  Filled 2017-04-05 (×2): qty 15

## 2017-04-05 MED ORDER — SODIUM CHLORIDE 0.9 % IV BOLUS (SEPSIS)
1000.0000 mL | Freq: Once | INTRAVENOUS | Status: AC
  Administered 2017-04-05: 1000 mL via INTRAVENOUS

## 2017-04-05 MED ORDER — VANCOMYCIN HCL IN DEXTROSE 1-5 GM/200ML-% IV SOLN
1000.0000 mg | Freq: Once | INTRAVENOUS | Status: AC
  Administered 2017-04-05: 1000 mg via INTRAVENOUS
  Filled 2017-04-05: qty 200

## 2017-04-05 MED ORDER — ACETAMINOPHEN 325 MG PO TABS
650.0000 mg | ORAL_TABLET | Freq: Four times a day (QID) | ORAL | Status: DC | PRN
  Administered 2017-04-08: 650 mg via ORAL
  Filled 2017-04-05: qty 2

## 2017-04-05 MED ORDER — ACETAMINOPHEN 500 MG PO TABS
1000.0000 mg | ORAL_TABLET | Freq: Once | ORAL | Status: AC
  Administered 2017-04-05: 1000 mg via ORAL
  Filled 2017-04-05: qty 2

## 2017-04-05 MED ORDER — DULOXETINE HCL 60 MG PO CPEP
60.0000 mg | ORAL_CAPSULE | Freq: Every day | ORAL | Status: DC
  Administered 2017-04-05 – 2017-04-07 (×3): 60 mg via ORAL
  Filled 2017-04-05 (×3): qty 1

## 2017-04-05 NOTE — ED Notes (Signed)
ED Provider at bedside. 

## 2017-04-05 NOTE — ED Notes (Signed)
Report attempted. RN to call back.

## 2017-04-05 NOTE — Patient Instructions (Signed)
See assessment and plan under each diagnosis in the problem list acutely for this visit

## 2017-04-05 NOTE — ED Notes (Signed)
Pt is more alert , responding appropriately.

## 2017-04-05 NOTE — ED Notes (Signed)
Got patient cleaned up and a ekg shown to er doctor

## 2017-04-05 NOTE — ED Triage Notes (Addendum)
Pt arrives by [facility] from [facility] for respiratory distress that ems reports started suddenly today. Pt arrives on Cpap and placed on BIpap on arrival. Pt has DNR at bedside.  Pt lethargic not answering questions or following commands. Pt was given 2G magnesium, 125mg  solu medrol.

## 2017-04-05 NOTE — ED Notes (Signed)
Cathter suction applied.

## 2017-04-05 NOTE — ED Notes (Signed)
Report given to amanda RN.

## 2017-04-05 NOTE — ED Provider Notes (Signed)
[facility] DEPT Provider Note   CSN: [id number] Arrival date & time: 04/05/17  1424     History   Chief Complaint Chief Complaint  Patient presents with  . Respiratory Distress    HPI Dawn Montoya is a 83 y.o. female.  HPI  Level 5 caveat due to severe respiratory distress and acuity of situation.  History provided by EMS. The patient presents from [facility] with respiratory distress, that was noted earlier today. On their arrival, she was pale, mottled in her extremities, and hypoxic to 70%. Patient denying any chest pain. Endorses recent cough but denies any fevers, edema, vomiting or diarrhea.  She has a history of COPD on 3 L home oxygen, CK D, hypertension, diastolic heart failure.  Past Medical History:  Diagnosis Date  . Anxiety state 02/25/2015  . Aortic atherosclerosis ([place]) 12/31/2016  . Aortic atherosclerosis ([place]) 12/31/2016  . Breast CA ([place])   . CKD (chronic kidney disease) stage 3, GFR 30-59 ml/min 12/15/2014  . COPD (chronic obstructive pulmonary disease) ([place])   . Diastolic CHF ([place]) 0/11/5850  . GERD without esophagitis 01/19/2016  . Hordeolum externum (stye)    07/01/2016  . HTN (hypertension) 01/18/2014  . Hypertension   . Hypothyroidism 01/18/2014  . Pneumonia   . Recurrent major depression ([place]) 10/03/2016  . Sigmoid diverticulosis   . Spondylosis, cervical, with myelopathy 04/11/2016    Patient Active Problem List   Diagnosis Date Noted  . Acute respiratory failure with hypoxia and hypercapnia ([place]) 04/05/2017  . Chronic respiratory failure ([place]) 02/06/2017  . Osteoarthritis 12/31/2016  . DDD (degenerative disc disease), lumbar 12/31/2016  . Aortic atherosclerosis ([place]) 12/31/2016  . Sacral pain 12/30/2016  . Severe obesity (BMI >= 40) ([place]) 12/29/2016  . MRSA carrier 12/29/2016  . Ill-fitting dentures 12/28/2016  . Recurrent major depression ([place]) 10/03/2016  . COPD exacerbation ([place]) 09/18/2016  . Pulmonary edema 09/18/2016  . Spondylosis,  cervical, with myelopathy 04/11/2016  . Meningioma ([place]) 04/11/2016  . GERD without esophagitis 01/19/2016  . Benign paroxysmal positional vertigo 07/18/2015  . Neuropathy 07/13/2015  . Cervical myelopathy with cervical radiculopathy 05/08/2015  . Anxiety state 02/25/2015  . Solitary pulmonary nodule 01/01/2015  . CKD (chronic kidney disease) stage 3, GFR 30-59 ml/min 12/15/2014  . Carbon dioxide retention 12/15/2014  . Uncontrolled hypertension 10/25/2014  . Hypertensive heart disease with acute on chronic diastolic congestive heart failure ([place]) 10/24/2014  . Acute on chronic diastolic CHF (congestive heart failure) ([place]) 02/22/2014  . COPD (chronic obstructive pulmonary disease) ([place]) 01/18/2014  . Acute on chronic respiratory failure with hypoxemia ([place]) 01/18/2014  . Anemia 01/18/2014  . Hypothyroidism 01/18/2014    Past Surgical History:  Procedure Laterality Date  . BREAST SURGERY    . CHOLECYSTECTOMY    . COLONOSCOPY N/A 01/20/2014   Procedure: COLONOSCOPY;  Surgeon: Winfield Cunas., MD;  Location: [facility];  Service: Endoscopy;  Laterality: N/A;  . ESOPHAGOGASTRODUODENOSCOPY N/A 01/19/2014   Procedure: ESOPHAGOGASTRODUODENOSCOPY (EGD);  Surgeon: Jeryl Columbia, MD;  Location: [facility];  Service: Endoscopy;  Laterality: N/A;  . MASTECTOMY Left     OB History    No data available       Home Medications    Prior to Admission medications   Medication Sig Start Date End Date Taking? Authorizing Provider  acetaminophen (TYLENOL) 500 MG tablet Take 500 mg by mouth every 6 (six) hours as needed (FOR PAIN).    Yes [provider]  albuterol (PROVENTIL HFA;VENTOLIN HFA) 108 (90 Base)  MCG/ACT inhaler Inhale 2 puffs into the lungs every 6 (six) hours as needed for wheezing or shortness of breath.   Yes [provider]  arformoterol (BROVANA) 15 MCG/2ML NEBU Take 2 mLs (15 mcg total) by nebulization 2 (two) times daily. 10/04/16  Yes Parrett, Tammy S,  NP  ARTIFICIAL TEAR SOLUTION OP Place 1 drop into both eyes 4 (four) times daily as needed (for dry eyes).   Yes [provider]  aspirin 81 MG chewable tablet Chew 1 tablet (81 mg total) by mouth daily. 01/02/17  Yes Rama, Venetia Maxon, MD  azithromycin (ZITHROMAX) 250 MG tablet Take 250 mg by mouth daily.   Yes [provider]  benzocaine (ORAJEL) 10 % mucosal gel Use as directed 1 application in the mouth or throat 3 (three) times daily before meals. FOR GUM PAIN   Yes [provider]  benzonatate (TESSALON) 100 MG capsule Take 1 capsule (100 mg total) by mouth 3 (three) times daily. 01/01/17  Yes Rama, Venetia Maxon, MD  bisacodyl (DULCOLAX) 10 MG suppository Place 10 mg rectally once as needed for mild constipation.    Yes [provider]  budesonide (PULMICORT) 0.25 MG/2ML nebulizer solution Take 0.25 mg by nebulization 2 (two) times daily.   Yes [provider]  carvedilol (COREG) 3.125 MG tablet Take 3.125 mg by mouth 2 (two) times daily with a meal. Hold for HR less than 60 and BP less than 100.   Yes [provider]  cholecalciferol (VITAMIN D) 1000 UNITS tablet Take 1,000 Units by mouth daily. Reported on 04/11/2016   Yes [provider]  cycloSPORINE (RESTASIS) 0.05 % ophthalmic emulsion Place 1 drop into both eyes 2 (two) times daily.   Yes [provider]  Dextromethorphan-Guaifenesin (MUCINEX DM MAXIMUM STRENGTH) 60-1200 MG TB12 Take 1 tablet by mouth 2 (two) times daily. Reported on 04/11/2016   Yes [provider]  DULoxetine (CYMBALTA) 60 MG capsule Take 1 capsule (60 mg total) by mouth daily. 03/29/15  Yes Lauree Chandler, NP  furosemide (LASIX) 40 MG tablet Take 40 mg by mouth daily. FOR CHF   Yes [provider]  gabapentin (NEURONTIN) 300 MG capsule Take 300 mg by mouth 3 (three) times daily.    Yes [provider]  ipratropium-albuterol (DUONEB) 0.5-2.5 (3) MG/3ML SOLN Take 3 mLs by  nebulization every 6 (six) hours. FOR COPD   Yes [provider]  levothyroxine (SYNTHROID, LEVOTHROID) 75 MCG tablet Take 75 mcg by mouth daily before breakfast.   Yes [provider]  magnesium hydroxide (MILK OF MAGNESIA) 400 MG/5ML suspension Take 30 mLs by mouth once as needed for mild constipation.    Yes [provider]  Melatonin 3 MG TABS Take 3 mg by mouth at bedtime.    Yes [provider]  nitroGLYCERIN (NITROSTAT) 0.4 MG SL tablet Place 0.4 mg under the tongue every 5 (five) minutes as needed for chest pain. CALL MD IF NO RELIEF AFTER 3 DOSES   Yes [provider]  OXYGEN Inhale 3 L into the lungs continuous. FOR HYPOXIA   Yes [provider]  pantoprazole (PROTONIX) 40 MG tablet Take 40 mg by mouth daily.    Yes [provider]  polyethylene glycol (MIRALAX / GLYCOLAX) packet Take 17 g by mouth daily. 01/02/17  Yes Rama, Venetia Maxon, MD  Propylene Glycol (SYSTANE BALANCE) 0.6 % SOLN Place 1 drop into both eyes 2 (two) times daily.   Yes [provider]  simethicone (MYLICON) 80 MG chewable tablet Chew 1 tablet (80 mg total) by mouth 4 (four) times daily as needed for flatulence. 01/01/17  Yes Rama, Venetia Maxon, MD  Sodium Phosphates (RA SALINE ENEMA) 19-7 GM/118ML ENEM Place 1 each rectally once as needed (for constipation). AND CALL PHYSICIAN IF NO RELIEF FROM THIS   Yes [provider]  traMADol (ULTRAM) 50 MG tablet Take 50 mg by mouth every 8 (eight) hours as needed for severe pain.   Yes [provider]  valsartan (DIOVAN) 40 MG tablet Take 20 mg by mouth daily.   Yes [provider]  predniSONE (DELTASONE) 10 MG tablet Take 10 mg by mouth 2 (two) times daily. FOR 5 DAYS    [provider]    Family History Family History  Problem Relation Age of Onset  . Lung cancer Sister   . Breast cancer Sister   . Breast cancer Sister   . Breast cancer Daughter   . Heart attack Neg  Hx     Social History Social History  Substance Use Topics  . Smoking status: Former Smoker    Packs/day: 2.00    Years: 65.00    Types: Cigarettes    Quit date: 10/25/1999  . Smokeless tobacco: Never Used  . Alcohol use No     Allergies   Patient has no known allergies.   Review of Systems Review of Systems  Unable to perform ROS: Severe respiratory distress     Physical Exam Updated Vital Signs BP (!) 121/43   Pulse 84   Temp (!) 101.1 F (38.4 C) (Rectal)   Resp 16   SpO2 96%   Physical Exam  Constitutional: She appears lethargic. She appears distressed.  Ill appearing, severe respiratory distress  HENT:  Head: Normocephalic and atraumatic.  Eyes: Pupils are equal, round, and reactive to light.  Neck: Neck supple.  Cardiovascular: Regular rhythm, normal heart sounds and intact distal pulses.  Tachycardia present.   Pulmonary/Chest: She is in respiratory distress.  Coarse breath sound anteriorly with minimal air movement.  Abdominal: Soft. There is no tenderness.  Musculoskeletal: She exhibits no deformity.  Neurological: She appears lethargic.  Arouses minimally to voice and touch and will say yes/no to simple questions  Skin: Skin is warm. There is pallor.     ED Treatments / Results  Labs (all labs ordered are listed, but only abnormal results are displayed) Labs Reviewed  COMPREHENSIVE METABOLIC PANEL - Abnormal; Notable for the following:       Result Value   Chloride 97 (*)    Glucose, Bld 231 (*)    Calcium 8.8 (*)    Albumin 3.3 (*)    Total Bilirubin 1.5 (*)    All other components within normal limits  BRAIN NATRIURETIC PEPTIDE - Abnormal; Notable for the following:    B Natriuretic Peptide 140.2 (*)    All other components within normal limits  CBC WITH DIFFERENTIAL/PLATELET - Abnormal; Notable for the following:    WBC 12.4 (*)    MCV 100.2 (*)    Neutro Abs 11.0 (*)    All other components within normal limits  I-STAT ARTERIAL  BLOOD GAS, ED - Abnormal; Notable for the following:    pH, Arterial 7.329 (*)    pCO2 arterial 67.0 (*)    pO2, Arterial 79.0 (*)    Bicarbonate 35.2 (*)    Acid-Base Excess 7.0 (*)    All other components within normal limits  CULTURE, BLOOD (ROUTINE  X 2)  CULTURE, BLOOD (ROUTINE X 2)  URINE CULTURE  CBC WITH DIFFERENTIAL/PLATELET  URINALYSIS, ROUTINE W REFLEX MICROSCOPIC  I-STAT TROPOININ, ED  I-STAT CG4 LACTIC ACID, ED  I-STAT CG4 LACTIC ACID, ED    EKG  EKG Interpretation None       Radiology Dg Chest Portable 1 View  Result Date: 04/05/2017 CLINICAL DATA:  Shortness of breath and respiratory failure today. EXAM: PORTABLE CHEST 1 VIEW COMPARISON:  12/28/2016 and prior radiographs FINDINGS: Cardiomegaly noted with pulmonary vascular congestion. Peribronchial thickening is unchanged. Left pleural effusion and left basilar atelectasis has significantly decreased since 12/28/2016. No acute abnormalities are noted. IMPRESSION: Cardiomegaly and pulmonary vascular congestion Electronically Signed   By: Margarette Canada M.D.   On: 04/05/2017 14:46    Procedures Procedures (including critical care time) CRITICAL CARE Performed by: Forde Dandy   Total critical care time: 45 minutes  Critical care time was exclusive of separately billable procedures and treating other patients.  Critical care was necessary to treat or prevent imminent or life-threatening deterioration.  Critical care was time spent personally by me on the following activities: development of treatment plan with patient and/or surrogate as well as nursing, discussions with consultants, evaluation of patient's response to treatment, examination of patient, obtaining history from patient or surrogate, ordering and performing treatments and interventions, ordering and review of laboratory studies, ordering and review of radiographic studies, pulse oximetry and re-evaluation of patient's condition.  Medications Ordered  in ED Medications  sodium chloride 0.9 % bolus 1,000 mL (1,000 mLs Intravenous New Bag/Given 04/05/17 1616)  acetaminophen (TYLENOL) tablet 1,000 mg (1,000 mg Oral Given 04/05/17 1617)  vancomycin (VANCOCIN) IVPB 1000 mg/200 mL premix (1,000 mg Intravenous New Bag/Given 04/05/17 1616)  piperacillin-tazobactam (ZOSYN) IVPB 3.375 g (0 g Intravenous Stopped 04/05/17 1706)     Initial Impression / Assessment and Plan / ED Course  I have reviewed the triage vital signs and the nursing notes.  Pertinent labs & imaging results that were available during my care of the patient were reviewed by me and considered in my medical decision making (see chart for details).    Presenting with acute hypoxic respiratory failure, likely in the setting of an acute COPD exacerbation. He received breathing treatment, steroids, and magnesium by EMS. Continued on BiPAP in the emergency department as patient is DO NOT RESUSCITATE. Initially minimally responsive, mottled in the extremities, with significant respiratory distress. She is febrile to 101.1 Fahrenheit, tachycardic, but normotensive. Empirically covered with vancomycin and Zosyn for potential healthcare associated pneumonia. Chest x-ray shows potential developing infiltrate in the right lower lobe, although radiologist felt that she had mild pulmonary vascular congestion but otherwise no acute cardiopulmonary processes. Blood cultures are obtained and urine is currently pending. Blood work reassuring with normal troponin and a normal lactic acid. Does not appear significantly fluid overloaded on exam to suspect significant heart failure exacerbation although could be component. On reevaluation, patient significantly improved, is alert, answering questions appropriately, and obeying commands. She is still on BiPAP. Spoke with hospitalist service, who will admit to stepdown.  Final Clinical Impressions(s) / ED Diagnoses   Final diagnoses:  Acute exacerbation of  chronic obstructive pulmonary disease (COPD) ([place])  Acute respiratory failure with hypoxia [facility])    New Prescriptions New Prescriptions   No medications on file     Forde Dandy, MD 04/05/17 1736

## 2017-04-05 NOTE — H&P (Addendum)
TRH H&P   Patient Demographics:    Dawn Montoya, is a 83 y.o. female  MRN: [id number]   DOB - [birth year]  Admit Date - 04/05/2017  Outpatient Primary MD for the patient is Hendricks Limes, MD  Referring MD: Dr Oleta Mouse  Outpatient Specialists: none  Patient coming from: [facility]  Chief Complaint  Patient presents with  . Respiratory Distress      HPI:    Dawn Montoya  is a 83 y.o. female, Resident of SNF with chronic hypercapnic respiratory failure secondary to COPD (on 3 L home O2), chronic prednisone, chronic kidney disease stage III, diastolic CHF, depression, peripheral neuropathy and hypertension who was brought to the ED by EMS with respiratory distress. Patient was seen by M.D. at [facility] yesterday for increasing cough with green sputum for past several days. Her prednisone dose was increased for 5 days and artery don't azithromycin. Earlier today she was found to be in respiratory distress and EMS was called. EMS found her to be pale with a mottled extremities and hypoxic in the 70s. Patient was also quite somnolent and poorly arousable. Primary symptoms were productive cough and increased shortness of breath only. She had denied any fever, chills, nausea, vomiting, headache, palpitations, chest pain, abdominal pain, weakness, bowel or urinary symptoms. She was brought to the ED on BiPAP. No further history could be obtained at patient was encephalopathic.  In the ED she was febrile with temperature of 101.63F, tachycardic to 113, low blood pressure of 92/47 mmHg. Blood work showed WBC of 12.4 K, normal hemoglobin, platelets and electrolytes. Blood glucose of 231. Lactic acid of 1.64. ABG showed pH of 7.32, PCO2 of 67 and PO2 of 79. BNP of 140. Chest x-ray showed pulmonary congestion, suspect right lower lobe pneumonia on when independently reviewed. Blood cultures sent  and patient started on empiric vancomycin and cefepime for  COPD exacerbation+/- HCAP. She was alert on my evaluation and answering to questions but remained quite confused. She reported her breathing to be better, denies any chest pain or abdominal pain. Was unable to provide further history.   Review of systems:    In addition to the HPI above, Full review of systems Limited due to patient's acute encephalopathy.   A full 10 point Review of Systems was done, except as stated above, all other Review of Systems were negative.   With Past History of the following :    Past Medical History:  Diagnosis Date  . Anxiety state 02/25/2015  . Aortic atherosclerosis ([place]) 12/31/2016  . Aortic atherosclerosis ([place]) 12/31/2016  . Breast CA ([place])   . CKD (chronic kidney disease) stage 3, GFR 30-59 ml/min 12/15/2014  . COPD (chronic obstructive pulmonary disease) ([place])   . Diastolic CHF ([place]) 0/10/6008  . GERD without esophagitis 01/19/2016  . Hordeolum externum (stye)    07/01/2016  . HTN (hypertension)  01/18/2014  . Hypertension   . Hypothyroidism 01/18/2014  . Pneumonia   . Recurrent major depression ([place]) 10/03/2016  . Sigmoid diverticulosis   . Spondylosis, cervical, with myelopathy 04/11/2016      Past Surgical History:  Procedure Laterality Date  . BREAST SURGERY    . CHOLECYSTECTOMY    . COLONOSCOPY N/A 01/20/2014   Procedure: COLONOSCOPY;  Surgeon: Winfield Cunas., MD;  Location: [facility];  Service: Endoscopy;  Laterality: N/A;  . ESOPHAGOGASTRODUODENOSCOPY N/A 01/19/2014   Procedure: ESOPHAGOGASTRODUODENOSCOPY (EGD);  Surgeon: Jeryl Columbia, MD;  Location: [facility];  Service: Endoscopy;  Laterality: N/A;  . MASTECTOMY Left       Social History:     Social History  Substance Use Topics  . Smoking status: Former Smoker    Packs/day: 2.00    Years: 65.00    Types: Cigarettes    Quit date: 10/25/1999  . Smokeless tobacco: Never Used  . Alcohol use No     Lives - At  [facility] skilled nursing facility  Mobility - ambulates with assistance   Family History :     Family History  Problem Relation Age of Onset  . Lung cancer Sister   . Breast cancer Sister   . Breast cancer Sister   . Breast cancer Daughter   . Heart attack Neg Hx       Home Medications:   Prior to Admission medications   Medication Sig Start Date End Date Taking? Authorizing Provider  acetaminophen (TYLENOL) 500 MG tablet Take 500 mg by mouth every 6 (six) hours as needed (FOR PAIN).    Yes [provider]  albuterol (PROVENTIL HFA;VENTOLIN HFA) 108 (90 Base) MCG/ACT inhaler Inhale 2 puffs into the lungs every 6 (six) hours as needed for wheezing or shortness of breath.   Yes [provider]  arformoterol (BROVANA) 15 MCG/2ML NEBU Take 2 mLs (15 mcg total) by nebulization 2 (two) times daily. 10/04/16  Yes Parrett, Tammy S, NP  ARTIFICIAL TEAR SOLUTION OP Place 1 drop into both eyes 4 (four) times daily as needed (for dry eyes).   Yes [provider]  aspirin 81 MG chewable tablet Chew 1 tablet (81 mg total) by mouth daily. 01/02/17  Yes Rama, Venetia Maxon, MD  azithromycin (ZITHROMAX) 250 MG tablet Take 250 mg by mouth daily.   Yes [provider]  benzocaine (ORAJEL) 10 % mucosal gel Use as directed 1 application in the mouth or throat 3 (three) times daily before meals. FOR GUM PAIN   Yes [provider]  benzonatate (TESSALON) 100 MG capsule Take 1 capsule (100 mg total) by mouth 3 (three) times daily. 01/01/17  Yes Rama, Venetia Maxon, MD  bisacodyl (DULCOLAX) 10 MG suppository Place 10 mg rectally once as needed for mild constipation.    Yes [provider]  budesonide (PULMICORT) 0.25 MG/2ML nebulizer solution Take 0.25 mg by nebulization 2 (two) times daily.   Yes [provider]  carvedilol (COREG) 3.125 MG tablet Take 3.125 mg by mouth 2 (two) times daily with a meal. Hold for HR less than 60 and BP less than 100.    Yes [provider]  cholecalciferol (VITAMIN D) 1000 UNITS tablet Take 1,000 Units by mouth daily. Reported on 04/11/2016   Yes [provider]  cycloSPORINE (RESTASIS) 0.05 % ophthalmic emulsion Place 1 drop into both eyes 2 (two) times daily.   Yes [provider]  Dextromethorphan-Guaifenesin (MUCINEX DM MAXIMUM STRENGTH) 60-1200 MG TB12  Take 1 tablet by mouth 2 (two) times daily. Reported on 04/11/2016   Yes [provider]  DULoxetine (CYMBALTA) 60 MG capsule Take 1 capsule (60 mg total) by mouth daily. 03/29/15  Yes Lauree Chandler, NP  furosemide (LASIX) 40 MG tablet Take 40 mg by mouth daily. FOR CHF   Yes [provider]  gabapentin (NEURONTIN) 300 MG capsule Take 300 mg by mouth 3 (three) times daily.    Yes [provider]  ipratropium-albuterol (DUONEB) 0.5-2.5 (3) MG/3ML SOLN Take 3 mLs by nebulization every 6 (six) hours. FOR COPD   Yes [provider]  levothyroxine (SYNTHROID, LEVOTHROID) 75 MCG tablet Take 75 mcg by mouth daily before breakfast.   Yes [provider]  magnesium hydroxide (MILK OF MAGNESIA) 400 MG/5ML suspension Take 30 mLs by mouth once as needed for mild constipation.    Yes [provider]  Melatonin 3 MG TABS Take 3 mg by mouth at bedtime.    Yes [provider]  nitroGLYCERIN (NITROSTAT) 0.4 MG SL tablet Place 0.4 mg under the tongue every 5 (five) minutes as needed for chest pain. CALL MD IF NO RELIEF AFTER 3 DOSES   Yes [provider]  OXYGEN Inhale 3 L into the lungs continuous. FOR HYPOXIA   Yes [provider]  pantoprazole (PROTONIX) 40 MG tablet Take 40 mg by mouth daily.    Yes [provider]  polyethylene glycol (MIRALAX / GLYCOLAX) packet Take 17 g by mouth daily. 01/02/17  Yes Rama, Venetia Maxon, MD  Propylene Glycol (SYSTANE BALANCE) 0.6 % SOLN Place 1 drop into both eyes 2 (two) times daily.   Yes [provider]  simethicone  (MYLICON) 80 MG chewable tablet Chew 1 tablet (80 mg total) by mouth 4 (four) times daily as needed for flatulence. 01/01/17  Yes Rama, Venetia Maxon, MD  Sodium Phosphates (RA SALINE ENEMA) 19-7 GM/118ML ENEM Place 1 each rectally once as needed (for constipation). AND CALL PHYSICIAN IF NO RELIEF FROM THIS   Yes [provider]  traMADol (ULTRAM) 50 MG tablet Take 50 mg by mouth every 8 (eight) hours as needed for severe pain.   Yes [provider]  valsartan (DIOVAN) 40 MG tablet Take 20 mg by mouth daily.   Yes [provider]  predniSONE (DELTASONE) 10 MG tablet Take 10 mg by mouth 2 (two) times daily. FOR 5 DAYS    [provider]     Allergies:    No Known Allergies   Physical Exam:   Vitals  Blood pressure 112/69, pulse 80, temperature (!) 101.1 F (38.4 C), temperature source Rectal, resp. rate 16, SpO2 100 %.  Gen.: Elderly morbidly obese female on BiPAP, awake and alert, responding to commands. HEENT: On BiPAP, moist mucosa Chest: Clear to auscultation bilaterally, diminished breath sounds overall due to body habitus  CVS: Normal S1 and S2, no murmurs rub or gallop GI: Mild abdominal distention, nontender, bowel sounds present Musculoskeletal: Warm, trace edema CNS: Alert and awake but confused and oriented to place only., Mild flapping tremors   Data Review:    CBC  Recent Labs Lab 04/05/17 1615  WBC 12.4*  HGB 12.6  HCT 40.4  PLT 239  MCV 100.2*  MCH 31.3  MCHC 31.2  RDW 15.1  LYMPHSABS 0.7  MONOABS 0.7  EOSABS 0.0  BASOSABS 0.0   ------------------------------------------------------------------------------------------------------------------  Chemistries   Recent Labs Lab 04/05/17 1426  NA 137  K 4.5  CL 97*  CO2 30  GLUCOSE 231*  BUN 14  CREATININE 0.75  CALCIUM 8.8*  AST 20  ALT 16  ALKPHOS 81  BILITOT 1.5*    ------------------------------------------------------------------------------------------------------------------ estimated creatinine clearance is 65.4 mL/min (by C-G formula based on SCr of 0.75 mg/dL). ------------------------------------------------------------------------------------------------------------------ No results for input(s): TSH, T4TOTAL, T3FREE, THYROIDAB in the last 72 hours.  Invalid input(s): FREET3  Coagulation profile No results for input(s): INR, PROTIME in the last 168 hours. ------------------------------------------------------------------------------------------------------------------- No results for input(s): DDIMER in the last 72 hours. -------------------------------------------------------------------------------------------------------------------  Cardiac Enzymes No results for input(s): CKMB, TROPONINI, MYOGLOBIN in the last 168 hours.  Invalid input(s): CK ------------------------------------------------------------------------------------------------------------------    Component Value Date/Time   BNP 140.2 (H) 04/05/2017 1615     ---------------------------------------------------------------------------------------------------------------  Urinalysis    Component Value Date/Time   COLORURINE YELLOW 09/18/2016 1637   APPEARANCEUR CLOUDY (A) 09/18/2016 1637   LABSPEC 1.015 09/18/2016 1637   PHURINE 7.0 09/18/2016 1637   GLUCOSEU NEGATIVE 09/18/2016 1637   HGBUR NEGATIVE 09/18/2016 1637   BILIRUBINUR NEGATIVE 09/18/2016 1637   KETONESUR NEGATIVE 09/18/2016 1637   PROTEINUR NEGATIVE 09/18/2016 1637   UROBILINOGEN 1.0 10/24/2014 2147   NITRITE NEGATIVE 09/18/2016 1637   LEUKOCYTESUR NEGATIVE 09/18/2016 1637    ----------------------------------------------------------------------------------------------------------------   Imaging Results:    Dg Chest Portable 1 View  Result Date: 04/05/2017 CLINICAL DATA:  Shortness of breath  and respiratory failure today. EXAM: PORTABLE CHEST 1 VIEW COMPARISON:  12/28/2016 and prior radiographs FINDINGS: Cardiomegaly noted with pulmonary vascular congestion. Peribronchial thickening is unchanged. Left pleural effusion and left basilar atelectasis has significantly decreased since 12/28/2016. No acute abnormalities are noted. IMPRESSION: Cardiomegaly and pulmonary vascular congestion Electronically Signed   By: Margarette Canada M.D.   On: 04/05/2017 14:46    EKG: Independently reviewed. Sinus tachycardia at 108 with PVCs, no ST-T changes. No QT prolongation.   Assessment & Plan:    Principal Problem:   Acute respiratory failure with hypoxia and hypercapnia (HCC) Secondary to combination of COPD with acute exacerbation and possible healthcare associated pneumonia. Sepsis pathway initiated in ED. Lactate normal. Admit to stepdown on BiPAP. - IV Solu-Medrol 60 mg 12 hours. Scheduled DuoNeb's. Resume twice a day Pulmicort and when necessary albuterol. Empiric vancomycin and cefepime. Supportive care with Tylenol and antitussive.  Active Problems: Sepsis Secondary to healthcare associated pneumonia and COPD. Started empiric vancomycin and cefepime. Check urine strep antigen and Legionella antigen. Follow blood culture and sputum culture.   Acute metabolic encephalopathy Secondary to sepsis and hypercapnic respiratory failure. Monitor with empiric antibiotics and BiPAP. Repeat ABG in a.m. avoid narcotics. Will hold her tramadol, Neurontin and Cymbalta.    Hypothyroidism Resume Synthroid.    CKD (chronic kidney disease) stage 3, GFR 30-59 ml/min Stable.  COPD with acute exacerbation Plan as outlined above   healthcare associated pneumonia Plan as above.  Chronic diastolic CHF Appears euvolemic. Resume home diuretic.       DVT Prophylaxis : Lovenox  AM Labs Ordered, also please review Full Orders  Family Communication: Admission, patients condition and plan of care  including tests being ordered have been discussed with the patient's granddaughter on the phone.  Code Status DNR  Likely DC to  SNF  Condition GUARDED    Consults called: none  Admission status:inpatient    Time spent in minutes : 70    Louellen Molder M.D on 04/05/2017 at 5:53 PM  Between 7am to 7pm - Pager - [phone]. After 7pm go to [email or web address] - password [email or web address]  [facility] -  Office  [phone]

## 2017-04-05 NOTE — Progress Notes (Signed)
Pharmacy Antibiotic Note  Dawn Montoya is a 83 y.o. female admitted from [facility] on 04/05/2017 with increasing cough and green, productive sputum.  Pharmacy has been consulted for vancomycin and cefepime dosing.  Patient is febrile and her WBC is elevated.  Renal function is at baseline.  She received vancomycin 1gm and Zosyn 3.375gm IV around 1615 today.   Plan: - Vanc 1gm IV for a total of 2gm loading dose, then 750mg  IV Q12H - Cefepime 1gm IV Q8H - Monitor renal fxn, clinical progress, vanc trough prior to 4th dose   Height: 5\' 3"  (160 cm) Weight: 245 lb 2.4 oz (111.2 kg) IBW/kg (Calculated) : 52.4  Temp (24hrs), Avg:100.1 F (37.8 C), Min:99 F (37.2 C), Max:101.1 F (38.4 C)   Recent Labs Lab 04/05/17 1426 04/05/17 1449 04/05/17 1615  WBC  --   --  12.4*  CREATININE 0.75  --   --   LATICACIDVEN  --  1.64  --     Estimated Creatinine Clearance: 63.8 mL/min (by C-G formula based on SCr of 0.75 mg/dL).    No Known Allergies   Vanc 6/13 >> Cefepime 6/13 >>  6/13 MRSA PCR - 6/13 BCx -  6/13 UCx -  6/13 sputum cx -     Alicha Raspberry D. Mina Marble, PharmD, BCPS Pager:  [phone] 04/05/2017, 9:17 PM

## 2017-04-05 NOTE — ED Notes (Signed)
Admitting at bedside 

## 2017-04-06 ENCOUNTER — Inpatient Hospital Stay (HOSPITAL_COMMUNITY): Payer: Medicare Other

## 2017-04-06 ENCOUNTER — Encounter (HOSPITAL_COMMUNITY): Payer: Self-pay | Admitting: Emergency Medicine

## 2017-04-06 LAB — BLOOD CULTURE ID PANEL (REFLEXED)
ACINETOBACTER BAUMANNII: NOT DETECTED
CANDIDA ALBICANS: NOT DETECTED
CANDIDA GLABRATA: NOT DETECTED
Candida krusei: NOT DETECTED
Candida parapsilosis: NOT DETECTED
Candida tropicalis: NOT DETECTED
ENTEROBACTER CLOACAE COMPLEX: NOT DETECTED
ENTEROBACTERIACEAE SPECIES: NOT DETECTED
ENTEROCOCCUS SPECIES: NOT DETECTED
ESCHERICHIA COLI: NOT DETECTED
Haemophilus influenzae: NOT DETECTED
Klebsiella oxytoca: NOT DETECTED
Klebsiella pneumoniae: NOT DETECTED
LISTERIA MONOCYTOGENES: NOT DETECTED
Methicillin resistance: DETECTED — AB
Neisseria meningitidis: NOT DETECTED
PSEUDOMONAS AERUGINOSA: NOT DETECTED
Proteus species: NOT DETECTED
STAPHYLOCOCCUS SPECIES: DETECTED — AB
STREPTOCOCCUS AGALACTIAE: NOT DETECTED
STREPTOCOCCUS PNEUMONIAE: NOT DETECTED
STREPTOCOCCUS SPECIES: NOT DETECTED
Serratia marcescens: NOT DETECTED
Staphylococcus aureus (BCID): NOT DETECTED
Streptococcus pyogenes: NOT DETECTED

## 2017-04-06 LAB — URINALYSIS, ROUTINE W REFLEX MICROSCOPIC
BILIRUBIN URINE: NEGATIVE
Glucose, UA: 50 mg/dL — AB
Hgb urine dipstick: NEGATIVE
Ketones, ur: 80 mg/dL — AB
Leukocytes, UA: NEGATIVE
Nitrite: NEGATIVE
PROTEIN: 30 mg/dL — AB
SPECIFIC GRAVITY, URINE: 1.032 — AB (ref 1.005–1.030)
pH: 5 (ref 5.0–8.0)

## 2017-04-06 LAB — CBC
HCT: 38.6 % (ref 36.0–46.0)
HEMOGLOBIN: 11.9 g/dL — AB (ref 12.0–15.0)
MCH: 30.9 pg (ref 26.0–34.0)
MCHC: 30.8 g/dL (ref 30.0–36.0)
MCV: 100.3 fL — ABNORMAL HIGH (ref 78.0–100.0)
Platelets: 252 10*3/uL (ref 150–400)
RBC: 3.85 MIL/uL — AB (ref 3.87–5.11)
RDW: 15.3 % (ref 11.5–15.5)
WBC: 6.4 10*3/uL (ref 4.0–10.5)

## 2017-04-06 LAB — BASIC METABOLIC PANEL
ANION GAP: 11 (ref 5–15)
BUN: 18 mg/dL (ref 6–20)
CALCIUM: 8.6 mg/dL — AB (ref 8.9–10.3)
CO2: 30 mmol/L (ref 22–32)
Chloride: 99 mmol/L — ABNORMAL LOW (ref 101–111)
Creatinine, Ser: 0.72 mg/dL (ref 0.44–1.00)
GFR calc non Af Amer: >60 mL/min (ref >60)
Glucose, Bld: 242 mg/dL — ABNORMAL HIGH (ref 65–99)
POTASSIUM: 3.9 mmol/L (ref 3.5–5.1)
Sodium: 140 mmol/L (ref 135–145)

## 2017-04-06 LAB — BLOOD GAS, ARTERIAL
Acid-Base Excess: 6.1 mmol/L — ABNORMAL HIGH (ref 0.0–2.0)
Bicarbonate: 31.1 mmol/L — ABNORMAL HIGH (ref 20.0–28.0)
Delivery systems: POSITIVE
Drawn by: 290171
Expiratory PAP: 5
FIO2: 40
Inspiratory PAP: 18
O2 Saturation: 98.4 %
Patient temperature: 98.6
RATE: 12 resp/min
pCO2 arterial: 53.3 mmHg — ABNORMAL HIGH (ref 32.0–48.0)
pH, Arterial: 7.384 (ref 7.350–7.450)
pO2, Arterial: 110 mmHg — ABNORMAL HIGH (ref 83.0–108.0)

## 2017-04-06 LAB — EXPECTORATED SPUTUM ASSESSMENT W GRAM STAIN, RFLX TO RESP C

## 2017-04-06 LAB — MRSA PCR SCREENING: MRSA by PCR: NEGATIVE

## 2017-04-06 LAB — CREATININE, SERUM
Creatinine, Ser: 0.82 mg/dL (ref 0.44–1.00)
GFR calc non Af Amer: >60 mL/min (ref >60)

## 2017-04-06 LAB — STREP PNEUMONIAE URINARY ANTIGEN: STREP PNEUMO URINARY ANTIGEN: NEGATIVE

## 2017-04-06 LAB — GLUCOSE, CAPILLARY
GLUCOSE-CAPILLARY: 223 mg/dL — AB (ref 65–99)
GLUCOSE-CAPILLARY: 364 mg/dL — AB (ref 65–99)
GLUCOSE-CAPILLARY: 280 mg/dL — AB (ref 65–99)
Glucose-Capillary: 271 mg/dL — ABNORMAL HIGH (ref 65–99)

## 2017-04-06 LAB — PROCALCITONIN: PROCALCITONIN: 1.54 ng/mL

## 2017-04-06 LAB — EXPECTORATED SPUTUM ASSESSMENT W REFEX TO RESP CULTURE: SPECIAL REQUESTS: NORMAL

## 2017-04-06 MED ORDER — ORAL CARE MOUTH RINSE
15.0000 mL | Freq: Two times a day (BID) | OROMUCOSAL | Status: DC
  Administered 2017-04-06 – 2017-04-09 (×7): 15 mL via OROMUCOSAL

## 2017-04-06 MED ORDER — CHLORHEXIDINE GLUCONATE 0.12 % MT SOLN
15.0000 mL | Freq: Two times a day (BID) | OROMUCOSAL | Status: DC
  Administered 2017-04-06 – 2017-04-09 (×8): 15 mL via OROMUCOSAL
  Filled 2017-04-06 (×6): qty 15

## 2017-04-06 MED ORDER — FUROSEMIDE 10 MG/ML IJ SOLN
40.0000 mg | Freq: Once | INTRAMUSCULAR | Status: AC
  Administered 2017-04-06: 40 mg via INTRAVENOUS
  Filled 2017-04-06: qty 4

## 2017-04-06 NOTE — Care Management Note (Addendum)
Case Management Note  Patient Details  Name: Dawn Montoya MRN: [id number] Date of Birth: 19-Feb-1932  Subjective/Objective:  From [facility] SNF, , when she came in to ED she was pale, mottled in her extremities, and hypoxic to 70%. Patient denying any chest pain. Endorses recent cough but denies any fevers, edema, vomiting or diarrhea, was on bipap.   PCP   Unice Cobble             Action/Plan: NCM will follow along with CSW for dc needs.  Expected Discharge Date:                  Expected Discharge Plan:  Skilled Nursing Facility  In-House Referral:  Clinical Social Work  Discharge planning Services  CM Consult  Post Acute Care Choice:    Choice offered to:     DME Arranged:    DME Agency:     HH Arranged:    [place] Agency:     Status of Service:  Completed, signed off  If discussed at [employer], dates discussed:    Additional Comments:  Zenon Mayo, RN 04/06/2017, 12:41 PM

## 2017-04-06 NOTE — Clinical Social Work Note (Signed)
Clinical Social Work Assessment  Patient Details  Name: Dawn Montoya MRN: [id number] Date of Birth: 05-21-1932  Date of referral:  04/06/17               Reason for consult:  Facility Placement                Permission sought to share information with:  Facility Art therapist granted to share information::  Yes, Verbal Permission Granted  Name::     Dawn Montoya  Agency::  [facility]  Relationship::  Granddaughter  Contact Information:  [phone]  Housing/Transportation Living arrangements for the past 2 months:  [place] of Information:  Patient Patient Interpreter Needed:  None Criminal Activity/Legal Involvement Pertinent to Current Situation/Hospitalization:  No - Comment as needed Significant Relationships:  Other Family Members Lives with:  Facility Resident Do you feel safe going back to the place where you live?  Yes Need for family participation in patient care:  No (Coment)  Care giving concerns:  CSW received consult regarding discharge planning. Patient reported that she resides at [facility] and would like to return at discharge. CSW to continue to follow and assist with discharge planning needs.   Social Worker assessment / plan:  CSW spoke with patient concerning possibility of returning to SNF.  Employment status:  Retired Forensic scientist:  Medicare PT Recommendations:  Not assessed at this time [facility] / Referral to community resources:  [place]  Patient/Family's Response to care:  Patient expressed agreement with discharge plan and would like [facility] for transport. Patient reported that her daughter has passed away recently which made the patient very upset. CSW provided active listening as patient shared.   Patient/Family's Understanding of and Emotional Response to Diagnosis, Current Treatment, and Prognosis:  Patient/family is realistic regarding therapy needs and expressed being hopeful for SNF  placement as well as her medical needs. Patient expressed understanding of CSW role and discharge process. No questions/concerns about plan or treatment.    Emotional Assessment Appearance:  Appears stated age Attitude/Demeanor/Rapport:  Other (Appropriate) Affect (typically observed):  Accepting, Adaptable, Appropriate Orientation:  Oriented to Self, Oriented to Place Alcohol / Substance use:  Not Applicable Psych involvement (Current and /or in the community):  No (Comment)  Discharge Needs  Concerns to be addressed:  Care Coordination Readmission within the last 30 days:  No Current discharge risk:  None Barriers to Discharge:  Continued Medical Work up   [employer], [place] 04/06/2017, 6:13 PM

## 2017-04-06 NOTE — NC FL2 (Signed)
[place] LEVEL OF CARE SCREENING TOOL     IDENTIFICATION  Patient Name: Dawn Montoya Birthdate: 1932-01-11 Sex: female Admission Date (Current Location): 04/05/2017  [facility] and [place] Number:  Herbalist and Address:  [facility], [place] [address] [place], [place] [address]      Provider Number: [id number]  Attending Physician Name and Address:  Louellen Molder, MD  Relative Name and Phone Number:  Clare Charon    Current Level of Care: Hospital Recommended Level of Care: [place] Prior Approval Number:    Date Approved/Denied:   PASRR Number:    Discharge Plan: SNF    Current Diagnoses: Patient Active Problem List   Diagnosis Date Noted  . Acute respiratory failure with hypoxia and hypercapnia ([place]) 04/05/2017  . HCAP (healthcare-associated pneumonia) 04/05/2017  . Acute metabolic encephalopathy 18/56/3149  . Chronic respiratory failure ([place]) 02/06/2017  . Osteoarthritis 12/31/2016  . DDD (degenerative disc disease), lumbar 12/31/2016  . Aortic atherosclerosis ([place]) 12/31/2016  . Sacral pain 12/30/2016  . Severe obesity (BMI >= 40) ([place]) 12/29/2016  . MRSA carrier 12/29/2016  . Ill-fitting dentures 12/28/2016  . Recurrent major depression ([place]) 10/03/2016  . COPD exacerbation ([place]) 09/18/2016  . Pulmonary edema 09/18/2016  . Spondylosis, cervical, with myelopathy 04/11/2016  . Meningioma ([place]) 04/11/2016  . GERD without esophagitis 01/19/2016  . Benign paroxysmal positional vertigo 07/18/2015  . Neuropathy 07/13/2015  . Cervical myelopathy with cervical radiculopathy 05/08/2015  . Anxiety state 02/25/2015  . Solitary pulmonary nodule 01/01/2015  . CKD (chronic kidney disease) stage 3, GFR 30-59 ml/min 12/15/2014  . Carbon dioxide retention 12/15/2014  . Uncontrolled hypertension 10/25/2014  . Hypertensive heart disease with acute on chronic diastolic congestive heart failure ([place])  10/24/2014  . Acute on chronic diastolic CHF (congestive heart failure) ([place]) 02/22/2014  . COPD (chronic obstructive pulmonary disease) ([place]) 01/18/2014  . Acute on chronic respiratory failure with hypoxemia ([place]) 01/18/2014  . Anemia 01/18/2014  . Hypothyroidism 01/18/2014    Orientation RESPIRATION BLADDER Height & Weight     Self, Place  O2 (Nasal cannula 3L) External catheter Weight: 114.6 kg (252 lb 10.4 oz) Height:  5\' 3"  (160 cm)  BEHAVIORAL SYMPTOMS/MOOD NEUROLOGICAL BOWEL NUTRITION STATUS      Continent Diet (Please see DC Summary)  AMBULATORY STATUS COMMUNICATION OF NEEDS Skin   Extensive Assist Verbally                         Personal Care Assistance Level of Assistance  Bathing, Feeding, Dressing Bathing Assistance: Maximum assistance Feeding assistance: Independent Dressing Assistance: Limited assistance     Functional Limitations Info             SPECIAL CARE FACTORS FREQUENCY                       Contractures      Additional Factors Info  Code Status, Allergies, Isolation Precautions, Insulin Sliding Scale, Psychotropic Code Status Info: DNR Allergies Info: NKA Psychotropic Info: Cymbalta Insulin Sliding Scale Info: 3x daily with meals Isolation Precautions Info: MRSA     Current Medications (04/06/2017):  This is the current hospital active medication list Current Facility-Administered Medications  Medication Dose Route Frequency Provider Last Rate Last Dose  . acetaminophen (TYLENOL) tablet 650 mg  650 mg Oral Q6H PRN Dhungel, Nishant, MD       Or  . acetaminophen (TYLENOL) suppository 650 mg  650 mg Rectal Q6H PRN Dhungel, Nishant, MD      . albuterol (PROVENTIL) (2.5 MG/3ML) 0.083% nebulizer solution 2.5 mg  2.5 mg Nebulization Q4H PRN Dhungel, Nishant, MD      . arformoterol (BROVANA) nebulizer solution 15 mcg  15 mcg Nebulization BID Dhungel, Nishant, MD   15 mcg at 04/06/17 0804  . aspirin chewable tablet 81 mg  81 mg Oral  Daily Dhungel, Nishant, MD   81 mg at 04/06/17 1010  . benzocaine (ORAJEL) 10 % mucosal gel 1 application  1 application Mouth/Throat TID AC Dhungel, Nishant, MD   1 application at 70/01/74 1708  . benzonatate (TESSALON) capsule 100 mg  100 mg Oral TID Dhungel, Nishant, MD   100 mg at 04/06/17 1537  . bisacodyl (DULCOLAX) suppository 10 mg  10 mg Rectal Daily PRN Dhungel, Nishant, MD      . budesonide (PULMICORT) nebulizer solution 0.25 mg  0.25 mg Nebulization BID Dhungel, Nishant, MD   0.25 mg at 04/06/17 0801  . carvedilol (COREG) tablet 3.125 mg  3.125 mg Oral BID WC Dhungel, Nishant, MD   3.125 mg at 04/06/17 1706  . ceFEPIme (MAXIPIME) 1 g in dextrose 5 % 50 mL IVPB  1 g Intravenous Q8H Tyrone Apple, [facility]   Stopped at 04/06/17 1608  . chlorhexidine (PERIDEX) 0.12 % solution 15 mL  15 mL Mouth Rinse BID Dhungel, Nishant, MD   15 mL at 04/06/17 1500  . cholecalciferol (VITAMIN D) tablet 1,000 Units  1,000 Units Oral Daily Dhungel, Nishant, MD   1,000 Units at 04/06/17 1011  . cycloSPORINE (RESTASIS) 0.05 % ophthalmic emulsion 1 drop  1 drop Both Eyes BID Dhungel, Nishant, MD   1 drop at 04/05/17 2300  . dextromethorphan-guaiFENesin (MUCINEX DM) 30-600 MG per 12 hr tablet 2 tablet  2 tablet Oral BID Dhungel, Nishant, MD   2 tablet at 04/06/17 1012  . DULoxetine (CYMBALTA) DR capsule 60 mg  60 mg Oral Daily Dhungel, Nishant, MD   60 mg at 04/06/17 1013  . enoxaparin (LOVENOX) injection 40 mg  40 mg Subcutaneous Q24H Dhungel, Nishant, MD   40 mg at 04/05/17 2300  . furosemide (LASIX) tablet 40 mg  40 mg Oral Daily Dhungel, Nishant, MD   40 mg at 04/06/17 1013  . guaiFENesin-dextromethorphan (ROBITUSSIN DM) 100-10 MG/5ML syrup 5 mL  5 mL Oral Q4H PRN Dhungel, Nishant, MD      . hydroxypropyl methylcellulose / hypromellose (ISOPTO TEARS / GONIOVISC) 2.5 % ophthalmic solution 1 drop  1 drop Both Eyes BID Dhungel, Nishant, MD   1 drop at 04/05/17 2300  . insulin aspart (novoLOG) injection 0-9 Units  0-9  Units Subcutaneous TID WC Dhungel, Nishant, MD   5 Units at 04/06/17 1707  . ipratropium-albuterol (DUONEB) 0.5-2.5 (3) MG/3ML nebulizer solution 3 mL  3 mL Nebulization Q4H Dhungel, Nishant, MD   3 mL at 04/06/17 1700  . irbesartan (AVAPRO) tablet 37.5 mg  37.5 mg Oral Daily Dhungel, Nishant, MD      . levothyroxine (SYNTHROID, LEVOTHROID) tablet 75 mcg  75 mcg Oral QAC breakfast Dhungel, Nishant, MD   75 mcg at 04/06/17 0749  . magnesium hydroxide (MILK OF MAGNESIA) suspension 30 mL  30 mL Oral Once PRN Dhungel, Nishant, MD      . MEDLINE mouth rinse  15 mL Mouth Rinse q12n4p Dhungel, Nishant, MD   15 mL at 04/06/17 1700  . methylPREDNISolone sodium succinate (SOLU-MEDROL) 125 mg/2 mL injection 60 mg  60  mg Intravenous Q12H Dhungel, Nishant, MD   60 mg at 04/06/17 1015  . nitroGLYCERIN (NITROSTAT) SL tablet 0.4 mg  0.4 mg Sublingual Q5 min PRN Dhungel, Nishant, MD      . ondansetron (ZOFRAN) tablet 4 mg  4 mg Oral Q6H PRN Dhungel, Nishant, MD       Or  . ondansetron (ZOFRAN) injection 4 mg  4 mg Intravenous Q6H PRN Dhungel, Nishant, MD      . pantoprazole (PROTONIX) EC tablet 40 mg  40 mg Oral Daily Dhungel, Nishant, MD   40 mg at 04/06/17 1014  . polyethylene glycol (MIRALAX / GLYCOLAX) packet 17 g  17 g Oral Daily Dhungel, Nishant, MD   17 g at 04/06/17 1016  . simethicone (MYLICON) chewable tablet 80 mg  80 mg Oral QID PRN Dhungel, Nishant, MD      . sodium chloride flush (NS) 0.9 % injection 3 mL  3 mL Intravenous Q12H Dhungel, Nishant, MD   3 mL at 04/06/17 1000     Discharge Medications: Please see discharge summary for a list of discharge medications.  Relevant Imaging Results:  Relevant Lab Results:   Additional Information SSn: [id number] [place], [place]

## 2017-04-06 NOTE — Progress Notes (Signed)
PROGRESS NOTE                                                                                                                                                                                                             Patient Demographics:    Dawn Montoya, is a 83 y.o. female, DOB - 1932-10-15, MGN:[id number]  Admit date - 04/05/2017   Admitting Physician Louellen Molder, MD  Outpatient Primary MD for the patient is Hendricks Limes, MD  LOS - 1  Outpatient Specialists:NONE  Chief Complaint  Patient presents with  . Respiratory Distress       Brief Narrative  83 y.o. female, Resident of SNF with chronic hypercapnic respiratory failure secondary to COPD (on 3 L home O2), chronic prednisone, chronic kidney disease stage III, diastolic CHF, depression, peripheral neuropathy and hypertension who was brought to the ED by EMS with respiratory distress. Pt admitted with acute on chr hypoxic and hypercapnic respiratory failure with sepsis secondary to COPD exacerbation and HCAP. Also had acute metabolic encephalopathy.    Subjective:   Taken off BiPAP this am. Was maintaining sats on 3.5 L. Feels breathing slightly better this am.   Assessment  & Plan :   Principal Problem:   Acute respiratory failure with hypoxia and hypercapnia (HCC) -combination of COPD with acute exacerbation and possible healthcare associated pneumonia. Admit to stepdown on BiPAP. Weaned off to [place] this am. - IV Solu-Medrol 60 mg 12 hours. Scheduled DuoNeb's. Resume twice a day Pulmicort and when necessary albuterol. Empiric vancomycin and cefepime. Supportive care with Tylenol and antitussive.  Active Problems: Sepsis Secondary to healthcare associated pneumonia and COPD. Narrow abx if cx negative. . urine strep antigen neg, pendingand Legionella antigen.   Acute metabolic encephalopathy Secondary to sepsis and hypercapnic respiratory failure.  Better oriented this am but still has some confusion. continue to   hold her tramadol, Neurontin and Cymbalta.    Hypothyroidism Continue  Synthroid.    CKD (chronic kidney disease) stage 3, GFR 30-59 ml/min Stable.  COPD with acute exacerbation Plan as outlined above   healthcare associated pneumonia Plan as above.  Chronic diastolic CHF Appears euvolemic . Continue lasix . ( ordered  40 mg IV x1 given trace pitting edema)  elevated blood glucose  secondary to solumedrol. Placed on SSI.     Code Status :  DNR  Family Communication  : none at bedside. Granddaughter notified on admission.  Disposition Plan  : continue step down monitoring today. Return to SNF possibly in next 48-72 hrs  Barriers For Discharge :active symptoms   Consults  :  none  Procedures  : none  DVT Prophylaxis  :  Lovenox  Lab Results  Component Value Date   PLT 252 04/06/2017    Antibiotics  :    Anti-infectives    Start     Dose/Rate Route Frequency Ordered Stop   04/06/17 1100  vancomycin (VANCOCIN) IVPB 750 mg/150 ml premix     750 mg 150 mL/hr over 60 Minutes Intravenous Every 12 hours 04/05/17 2124     04/06/17 0000  ceFEPIme (MAXIPIME) 1 g in dextrose 5 % 50 mL IVPB     1 g 100 mL/hr over 30 Minutes Intravenous Every 8 hours 04/05/17 2124     04/05/17 2130  vancomycin (VANCOCIN) IVPB 1000 mg/200 mL premix     1,000 mg 200 mL/hr over 60 Minutes Intravenous NOW 04/05/17 2124 04/05/17 2350   04/05/17 1545  vancomycin (VANCOCIN) IVPB 1000 mg/200 mL premix     1,000 mg 200 mL/hr over 60 Minutes Intravenous  Once 04/05/17 1539 04/05/17 1741   04/05/17 1545  piperacillin-tazobactam (ZOSYN) IVPB 3.375 g     3.375 g 100 mL/hr over 30 Minutes Intravenous  Once 04/05/17 1539 04/05/17 1706        Objective:   Vitals:   04/06/17 0330 04/06/17 0520 04/06/17 0755 04/06/17 0759  BP: 97/81  (!) 145/59   Pulse:      Resp: (!) 23     Temp: 97.6 F (36.4 C)  98.1 F (36.7 C)     TempSrc: Axillary  Oral   SpO2: 99%   96%  Weight:  114.6 kg (252 lb 10.4 oz)    Height:        Wt Readings from Last 3 Encounters:  04/06/17 114.6 kg (252 lb 10.4 oz)  04/04/17 115.9 kg (255 lb 8.2 oz)  03/24/17 115.9 kg (255 lb 9.6 oz)     Intake/Output Summary (Last 24 hours) at 04/06/17 0835 Last data filed at 04/06/17 0520  Gross per 24 hour  Intake             1500 ml  Output              280 ml  Net             1220 ml     Physical Exam  Gen: not in distress HEENT:moist mucosa, supple neck Chest: clear b/l, no added sounds CVS: N S1&S2, no murmurs, rubs or gallop GI: soft, NT, ND, BS+ Musculoskeletal: warm, trace edema CNS: AAOX1-2, non focal    Data Review:    CBC  Recent Labs Lab 04/05/17 1615 04/06/17 0420  WBC 12.4* 6.4  HGB 12.6 11.9*  HCT 40.4 38.6  PLT 239 252  MCV 100.2* 100.3*  MCH 31.3 30.9  MCHC 31.2 30.8  RDW 15.1 15.3  LYMPHSABS 0.7  --   MONOABS 0.7  --   EOSABS 0.0  --   BASOSABS 0.0  --     Chemistries   Recent Labs Lab 04/05/17 1426 04/05/17 2347 04/06/17 0420  NA 137  --  140  K 4.5  --  3.9  CL 97*  --  99*  CO2 30  --  30  GLUCOSE 231*  --  242*  BUN  14  --  18  CREATININE 0.75 0.82 0.72  CALCIUM 8.8*  --  8.6*  AST 20  --   --   ALT 16  --   --   ALKPHOS 81  --   --   BILITOT 1.5*  --   --    ------------------------------------------------------------------------------------------------------------------ No results for input(s): CHOL, HDL, LDLCALC, TRIG, CHOLHDL, LDLDIRECT in the last 72 hours.  Lab Results  Component Value Date   HGBA1C 8.7 03/17/2017   ------------------------------------------------------------------------------------------------------------------ No results for input(s): TSH, T4TOTAL, T3FREE, THYROIDAB in the last 72 hours.  Invalid input(s): FREET3 ------------------------------------------------------------------------------------------------------------------ No results for  input(s): VITAMINB12, FOLATE, FERRITIN, TIBC, IRON, RETICCTPCT in the last 72 hours.  Coagulation profile No results for input(s): INR, PROTIME in the last 168 hours.  No results for input(s): DDIMER in the last 72 hours.  Cardiac Enzymes No results for input(s): CKMB, TROPONINI, MYOGLOBIN in the last 168 hours.  Invalid input(s): CK ------------------------------------------------------------------------------------------------------------------    Component Value Date/Time   BNP 140.2 (H) 04/05/2017 1615    Inpatient Medications  Scheduled Meds: . arformoterol  15 mcg Nebulization BID  . aspirin  81 mg Oral Daily  . benzocaine  1 application Mouth/Throat TID AC  . benzonatate  100 mg Oral TID  . budesonide  0.25 mg Nebulization BID  . carvedilol  3.125 mg Oral BID WC  . cholecalciferol  1,000 Units Oral Daily  . cycloSPORINE  1 drop Both Eyes BID  . dextromethorphan-guaiFENesin  2 tablet Oral BID  . DULoxetine  60 mg Oral Daily  . enoxaparin (LOVENOX) injection  40 mg Subcutaneous Q24H  . furosemide  40 mg Intravenous Once  . furosemide  40 mg Oral Daily  . hydroxypropyl methylcellulose / hypromellose  1 drop Both Eyes BID  . insulin aspart  0-9 Units Subcutaneous TID WC  . ipratropium-albuterol  3 mL Nebulization Q4H  . irbesartan  37.5 mg Oral Daily  . levothyroxine  75 mcg Oral QAC breakfast  . methylPREDNISolone (SOLU-MEDROL) injection  60 mg Intravenous Q12H  . pantoprazole  40 mg Oral Daily  . polyethylene glycol  17 g Oral Daily  . sodium chloride flush  3 mL Intravenous Q12H   Continuous Infusions: . ceFEPime (MAXIPIME) IV Stopped (04/06/17 0819)  . vancomycin     PRN Meds:.acetaminophen **OR** acetaminophen, albuterol, bisacodyl, guaiFENesin-dextromethorphan, magnesium hydroxide, nitroGLYCERIN, ondansetron **OR** ondansetron (ZOFRAN) IV, simethicone  Micro Results Recent Results (from the past 240 hour(s))  MRSA PCR Screening     Status: None    Collection Time: 04/05/17  9:02 PM  Result Value Ref Range Status   MRSA by PCR NEGATIVE NEGATIVE Final    Comment:        The GeneXpert MRSA Assay (FDA approved for NASAL specimens only), is one component of a comprehensive MRSA colonization surveillance program. It is not intended to diagnose MRSA infection nor to guide or monitor treatment for MRSA infections.   Culture, sputum-assessment     Status: None   Collection Time: 04/05/17 11:52 PM  Result Value Ref Range Status   Specimen Description EXPECTORATED SPUTUM  Final   Special Requests Normal  Final   Sputum evaluation THIS SPECIMEN IS ACCEPTABLE FOR SPUTUM CULTURE  Final   Report Status 04/06/2017 FINAL  Final  Culture, respiratory (NON-Expectorated)     Status: None (Preliminary result)   Collection Time: 04/05/17 11:52 PM  Result Value Ref Range Status   Specimen Description EXPECTORATED SPUTUM  Final   Special Requests Normal  Reflexed from [id number]  Final   Gram Stain   Final    MODERATE WBC PRESENT, PREDOMINANTLY PMN FEW GRAM POSITIVE COCCI RARE GRAM NEGATIVE RODS RARE GRAM POSITIVE RODS RARE GRAM NEGATIVE COCCOBACILLI    Culture PENDING  Incomplete   Report Status PENDING  Incomplete    Radiology Reports Dg Chest Portable 1 View  Result Date: 04/05/2017 CLINICAL DATA:  Shortness of breath and respiratory failure today. EXAM: PORTABLE CHEST 1 VIEW COMPARISON:  12/28/2016 and prior radiographs FINDINGS: Cardiomegaly noted with pulmonary vascular congestion. Peribronchial thickening is unchanged. Left pleural effusion and left basilar atelectasis has significantly decreased since 12/28/2016. No acute abnormalities are noted. IMPRESSION: Cardiomegaly and pulmonary vascular congestion Electronically Signed   By: Margarette Canada M.D.   On: 04/05/2017 14:46    Time Spent in minutes  35   Louellen Molder M.D on 04/06/2017 at 8:35 AM  Between 7am to 7pm - Pager - [phone]  After 7pm go to [email or web address] - password  [facility]  [facility] -  Office  [phone]

## 2017-04-06 NOTE — Progress Notes (Signed)
Inpatient Diabetes Program Recommendations  AACE/ADA: New Consensus Statement on Inpatient Glycemic Control (2015)  Target Ranges:  Prepandial:   less than 140 mg/dL      Peak postprandial:   less than 180 mg/dL (1-2 hours)      Critically ill patients:  140 - 180 mg/dL   Review of Glycemic Control  Diabetes history: DM 2 Diagnosed 03/24/2017 Outpatient Diabetes medications: Metformin 500 mg BID, patient also taking steroids Current orders for Inpatient glycemic control: Novolog Sensitive tid  Note on 6/1 Dawn Mustache NP at patient's facility noted: Type 2 diabetes mellitus with diabetic neuropathy, without long-term current use of insulin ([place]) New diagnosis after blood sguar found to be 306 on BMP, A1c at 8.7. RD to provide diabetic education, will change to diabetic diet. CBG ACHS To start metformin 500 mg daily for 1 week then to increase to twice daily   Inpatient Diabetes Program Recommendations:   Due to increase in steroids while inpatient, please consider increasing correction scale to Novolog Moderate tid and add Novolog HS scale.   Thanks,  Tama Headings RN, MSN, [facility] Inpatient Diabetes Coordinator Team Pager [phone] (8a-5p)

## 2017-04-06 NOTE — Progress Notes (Signed)
  PHARMACY - PHYSICIAN COMMUNICATION CRITICAL VALUE ALERT - BLOOD CULTURE IDENTIFICATION (BCID)  Results for orders placed or performed during the hospital encounter of 04/05/17  Blood Culture ID Panel (Reflexed) (Collected: 04/05/2017  2:38 PM)  Result Value Ref Range   Enterococcus species NOT DETECTED NOT DETECTED   Listeria monocytogenes NOT DETECTED NOT DETECTED   Staphylococcus species DETECTED (A) NOT DETECTED   Staphylococcus aureus NOT DETECTED NOT DETECTED   Methicillin resistance DETECTED (A) NOT DETECTED   Streptococcus species NOT DETECTED NOT DETECTED   Streptococcus agalactiae NOT DETECTED NOT DETECTED   Streptococcus pneumoniae NOT DETECTED NOT DETECTED   Streptococcus pyogenes NOT DETECTED NOT DETECTED   Acinetobacter baumannii NOT DETECTED NOT DETECTED   Enterobacteriaceae species NOT DETECTED NOT DETECTED   Enterobacter cloacae complex NOT DETECTED NOT DETECTED   Escherichia coli NOT DETECTED NOT DETECTED   Klebsiella oxytoca NOT DETECTED NOT DETECTED   Klebsiella pneumoniae NOT DETECTED NOT DETECTED   Proteus species NOT DETECTED NOT DETECTED   Serratia marcescens NOT DETECTED NOT DETECTED   Haemophilus influenzae NOT DETECTED NOT DETECTED   Neisseria meningitidis NOT DETECTED NOT DETECTED   Pseudomonas aeruginosa NOT DETECTED NOT DETECTED   Candida albicans NOT DETECTED NOT DETECTED   Candida glabrata NOT DETECTED NOT DETECTED   Candida krusei NOT DETECTED NOT DETECTED   Candida parapsilosis NOT DETECTED NOT DETECTED   Candida tropicalis NOT DETECTED NOT DETECTED    Name of physician (or Provider) Contacted: Dhungel (via text page)  Changes to prescribed antibiotics required: None - on Vancomycin + Cefepime for PNA coverage. Can consider d/cing Vancomycin since likely contaminant and MRSA PCR negative.   Thank you for allowing pharmacy to be a part of this patient's care.  Alycia Rossetti, PharmD, BCPS Clinical Pharmacist 04/06/2017 1:05 PM

## 2017-04-07 LAB — LEGIONELLA PNEUMOPHILA SEROGP 1 UR AG: L. PNEUMOPHILA SEROGP 1 UR AG: NEGATIVE

## 2017-04-07 LAB — BASIC METABOLIC PANEL
ANION GAP: 10 (ref 5–15)
BUN: 23 mg/dL — ABNORMAL HIGH (ref 6–20)
CHLORIDE: 96 mmol/L — AB (ref 101–111)
CO2: 33 mmol/L — ABNORMAL HIGH (ref 22–32)
Calcium: 9 mg/dL (ref 8.9–10.3)
Creatinine, Ser: 0.82 mg/dL (ref 0.44–1.00)
Glucose, Bld: 251 mg/dL — ABNORMAL HIGH (ref 65–99)
Potassium: 4.1 mmol/L (ref 3.5–5.1)
SODIUM: 139 mmol/L (ref 135–145)

## 2017-04-07 LAB — GLUCOSE, CAPILLARY
GLUCOSE-CAPILLARY: 240 mg/dL — AB (ref 65–99)
GLUCOSE-CAPILLARY: 231 mg/dL — AB (ref 65–99)
Glucose-Capillary: 218 mg/dL — ABNORMAL HIGH (ref 65–99)
Glucose-Capillary: 254 mg/dL — ABNORMAL HIGH (ref 65–99)

## 2017-04-07 LAB — URINE CULTURE

## 2017-04-07 MED ORDER — IPRATROPIUM-ALBUTEROL 0.5-2.5 (3) MG/3ML IN SOLN
3.0000 mL | Freq: Four times a day (QID) | RESPIRATORY_TRACT | Status: DC
  Administered 2017-04-07 – 2017-04-10 (×9): 3 mL via RESPIRATORY_TRACT
  Filled 2017-04-07 (×10): qty 3

## 2017-04-07 MED ORDER — METHYLPREDNISOLONE SODIUM SUCC 125 MG IJ SOLR
60.0000 mg | Freq: Every day | INTRAMUSCULAR | Status: DC
  Filled 2017-04-07: qty 2

## 2017-04-07 MED ORDER — GUAIFENESIN-DM 100-10 MG/5ML PO SYRP
10.0000 mL | ORAL_SOLUTION | ORAL | Status: DC | PRN
  Administered 2017-04-07: 10 mL via ORAL
  Filled 2017-04-07: qty 10

## 2017-04-07 MED ORDER — ENOXAPARIN SODIUM 60 MG/0.6ML ~~LOC~~ SOLN
55.0000 mg | SUBCUTANEOUS | Status: DC
  Administered 2017-04-07 – 2017-04-09 (×3): 55 mg via SUBCUTANEOUS
  Filled 2017-04-07 (×3): qty 0.6

## 2017-04-07 NOTE — Progress Notes (Signed)
PROGRESS NOTE                                                                                                                                                                                                             Patient Demographics:    Dawn Montoya, is a 83 y.o. female, DOB - September 18, 1932, MHW:[id number]  Admit date - 04/05/2017   Admitting Physician Louellen Molder, MD  Outpatient Primary MD for the patient is Hendricks Limes, MD  LOS - 2  Outpatient Specialists:NONE  Chief Complaint  Patient presents with  . Respiratory Distress       Brief Narrative  83 y.o. female, Resident of SNF with chronic hypercapnic respiratory failure secondary to COPD (on 3 L home O2), chronic prednisone, chronic kidney disease stage III, diastolic CHF, depression, peripheral neuropathy and hypertension who was brought to the ED by EMS with respiratory distress. Pt admitted with acute on chr hypoxic and hypercapnic respiratory failure with sepsis secondary to COPD exacerbation and HCAP. Also had acute metabolic encephalopathy.    Subjective:     on 3.0 L.No cp, no sob, still has a cough   Assessment  & Plan :   Principal Problem:   Acute respiratory failure with hypoxia and hypercapnia (HCC) -combination of COPD with acute exacerbation and possible healthcare associated pneumonia. needed BiPAP initially .currently on 3l which is her baseline home oxygen need  - IV Solu-Medrol 60 mg 12 hours. Scheduled DuoNeb's. continue Pulmicort and when necessary albuterol.  Empiric vancomycin and cefepime.day #3  Narrow abx in am  Add Robitussin for cough    Sepsis Secondary to healthcare associated pneumonia and COPD. Narrow abx in am if cx  negative. . urine strep antigen neg, pending  Legionella antigen.   Acute metabolic encephalopathy Secondary to sepsis and hypercapnic respiratory failure. Better oriented this am  hold her  tramadol, Neurontin and Cymbalta.    Hypothyroidism Continue  Synthroid.TSH 2.95 01/26/17    CKD (chronic kidney disease) stage 3, GFR 30-59 ml/min Stable.  COPD with acute exacerbation Plan as outlined above   healthcare associated pneumonia Plan as above.  Chronic diastolic CHF Appears euvolemic . Continue lasix 40 mg PO daily  elevated blood glucose  secondary to solumedrol. Placed on SSI.Start lantus      Code Status : DNR  Family Communication  :  none at bedside. Granddaughter notified on admission.  Disposition Plan  :  [place] to tele  Return to SNF possibly in next 48-72 hrs  Barriers For Discharge :active symptoms   Consults  :  none  Procedures  : none  DVT Prophylaxis  :  Lovenox  Lab Results  Component Value Date   PLT 252 04/06/2017    Antibiotics  :    Anti-infectives    Start     Dose/Rate Route Frequency Ordered Stop   04/06/17 1100  vancomycin (VANCOCIN) IVPB 750 mg/150 ml premix  Status:  Discontinued     750 mg 150 mL/hr over 60 Minutes Intravenous Every 12 hours 04/05/17 2124 04/06/17 1447   04/06/17 0000  ceFEPIme (MAXIPIME) 1 g in dextrose 5 % 50 mL IVPB     1 g 100 mL/hr over 30 Minutes Intravenous Every 8 hours 04/05/17 2124     04/05/17 2130  vancomycin (VANCOCIN) IVPB 1000 mg/200 mL premix     1,000 mg 200 mL/hr over 60 Minutes Intravenous NOW 04/05/17 2124 04/05/17 2350   04/05/17 1545  vancomycin (VANCOCIN) IVPB 1000 mg/200 mL premix     1,000 mg 200 mL/hr over 60 Minutes Intravenous  Once 04/05/17 1539 04/05/17 1741   04/05/17 1545  piperacillin-tazobactam (ZOSYN) IVPB 3.375 g     3.375 g 100 mL/hr over 30 Minutes Intravenous  Once 04/05/17 1539 04/05/17 1706        Objective:   Vitals:   04/07/17 0739 04/07/17 0741 04/07/17 0807 04/07/17 1144  BP:   105/73   Pulse:      Resp:      Temp:   98.5 F (36.9 C)   TempSrc:   Oral   SpO2: 97% 97%  97%  Weight:      Height:        Wt Readings from Last 3 Encounters:   04/07/17 115.2 kg (253 lb 15.5 oz)  04/04/17 115.9 kg (255 lb 8.2 oz)  03/24/17 115.9 kg (255 lb 9.6 oz)     Intake/Output Summary (Last 24 hours) at 04/07/17 1251 Last data filed at 04/07/17 0120  Gross per 24 hour  Intake              340 ml  Output              250 ml  Net               90 ml     Physical Exam  Gen: not in distress HEENT:moist mucosa, supple neck Chest: clear b/l, no added sounds CVS: N S1&S2, no murmurs, rubs or gallop GI: soft, NT, ND, BS+ Musculoskeletal: warm, trace edema CNS: AAOX1-2, non focal    Data Review:    CBC  Recent Labs Lab 04/05/17 1615 04/06/17 0420  WBC 12.4* 6.4  HGB 12.6 11.9*  HCT 40.4 38.6  PLT 239 252  MCV 100.2* 100.3*  MCH 31.3 30.9  MCHC 31.2 30.8  RDW 15.1 15.3  LYMPHSABS 0.7  --   MONOABS 0.7  --   EOSABS 0.0  --   BASOSABS 0.0  --     Chemistries   Recent Labs Lab 04/05/17 1426 04/05/17 2347 04/06/17 0420 04/07/17 0439  NA 137  --  140 139  K 4.5  --  3.9 4.1  CL 97*  --  99* 96*  CO2 30  --  30 33*  GLUCOSE 231*  --  242* 251*  BUN 14  --  18 23*  CREATININE 0.75 0.82 0.72 0.82  CALCIUM 8.8*  --  8.6* 9.0  AST 20  --   --   --   ALT 16  --   --   --   ALKPHOS 81  --   --   --   BILITOT 1.5*  --   --   --    ------------------------------------------------------------------------------------------------------------------ No results for input(s): CHOL, HDL, LDLCALC, TRIG, CHOLHDL, LDLDIRECT in the last 72 hours.  Lab Results  Component Value Date   HGBA1C 8.7 03/17/2017   ------------------------------------------------------------------------------------------------------------------ No results for input(s): TSH, T4TOTAL, T3FREE, THYROIDAB in the last 72 hours.  Invalid input(s): FREET3 ------------------------------------------------------------------------------------------------------------------ No results for input(s): VITAMINB12, FOLATE, FERRITIN, TIBC, IRON, RETICCTPCT in the  last 72 hours.  Coagulation profile No results for input(s): INR, PROTIME in the last 168 hours.  No results for input(s): DDIMER in the last 72 hours.  Cardiac Enzymes No results for input(s): CKMB, TROPONINI, MYOGLOBIN in the last 168 hours.  Invalid input(s): CK ------------------------------------------------------------------------------------------------------------------    Component Value Date/Time   BNP 140.2 (H) 04/05/2017 1615    Inpatient Medications  Scheduled Meds: . arformoterol  15 mcg Nebulization BID  . aspirin  81 mg Oral Daily  . benzocaine  1 application Mouth/Throat TID AC  . benzonatate  100 mg Oral TID  . budesonide  0.25 mg Nebulization BID  . carvedilol  3.125 mg Oral BID WC  . chlorhexidine  15 mL Mouth Rinse BID  . cholecalciferol  1,000 Units Oral Daily  . cycloSPORINE  1 drop Both Eyes BID  . dextromethorphan-guaiFENesin  2 tablet Oral BID  . DULoxetine  60 mg Oral Daily  . enoxaparin (LOVENOX) injection  55 mg Subcutaneous Q24H  . furosemide  40 mg Oral Daily  . hydroxypropyl methylcellulose / hypromellose  1 drop Both Eyes BID  . insulin aspart  0-9 Units Subcutaneous TID WC  . ipratropium-albuterol  3 mL Nebulization Q4H  . irbesartan  37.5 mg Oral Daily  . levothyroxine  75 mcg Oral QAC breakfast  . mouth rinse  15 mL Mouth Rinse q12n4p  . methylPREDNISolone (SOLU-MEDROL) injection  60 mg Intravenous Q12H  . pantoprazole  40 mg Oral Daily  . polyethylene glycol  17 g Oral Daily  . sodium chloride flush  3 mL Intravenous Q12H   Continuous Infusions: . ceFEPime (MAXIPIME) IV Stopped (04/07/17 0921)   PRN Meds:.acetaminophen **OR** acetaminophen, albuterol, bisacodyl, guaiFENesin-dextromethorphan, magnesium hydroxide, nitroGLYCERIN, ondansetron **OR** ondansetron (ZOFRAN) IV, simethicone  Micro Results Recent Results (from the past 240 hour(s))  Urine culture     Status: Abnormal   Collection Time: 04/05/17 12:08 AM  Result Value Ref  Range Status   Specimen Description URINE, RANDOM  Final   Special Requests NONE  Final   Culture MULTIPLE SPECIES PRESENT, SUGGEST RECOLLECTION (A)  Final   Report Status 04/07/2017 FINAL  Final  Blood culture (routine x 2)     Status: None (Preliminary result)   Collection Time: 04/05/17  2:38 PM  Result Value Ref Range Status   Specimen Description BLOOD BLOOD LEFT FOREARM  Final   Special Requests   Final    BOTTLES DRAWN AEROBIC AND ANAEROBIC Blood Culture adequate volume   Culture  Setup Time   Final    GRAM POSITIVE COCCI IN CLUSTERS IN BOTH AEROBIC AND ANAEROBIC BOTTLES CRITICAL RESULT CALLED TO, READ BACK BY AND VERIFIED WITH: PHARMD B. MARTIN [id number] 04/06/2017 T. TYSOR    Culture GRAM POSITIVE COCCI  Final   Report Status PENDING  Incomplete  Blood Culture ID Panel (Reflexed)     Status: Abnormal   Collection Time: 04/05/17  2:38 PM  Result Value Ref Range Status   Enterococcus species NOT DETECTED NOT DETECTED Final   Listeria monocytogenes NOT DETECTED NOT DETECTED Final   Staphylococcus species DETECTED (A) NOT DETECTED Final    Comment: Methicillin (oxacillin) resistant coagulase negative staphylococcus. Possible blood culture contaminant (unless isolated from more than one blood culture draw or clinical case suggests pathogenicity). No antibiotic treatment is indicated for blood  culture contaminants. CRITICAL RESULT CALLED TO, READ BACK BY AND VERIFIED WITH: [place] [id number] 04/06/2017 T.TYSOR    Staphylococcus aureus NOT DETECTED NOT DETECTED Final   Methicillin resistance DETECTED (A) NOT DETECTED Final    Comment: CRITICAL RESULT CALLED TO, READ BACK BY AND VERIFIED WITH: PHARMD B. MARTIN [id number] 04/06/2017 T.TYSOR    Streptococcus species NOT DETECTED NOT DETECTED Final   Streptococcus agalactiae NOT DETECTED NOT DETECTED Final   Streptococcus pneumoniae NOT DETECTED NOT DETECTED Final   Streptococcus pyogenes NOT DETECTED NOT DETECTED Final   Acinetobacter  baumannii NOT DETECTED NOT DETECTED Final   Enterobacteriaceae species NOT DETECTED NOT DETECTED Final   Enterobacter cloacae complex NOT DETECTED NOT DETECTED Final   Escherichia coli NOT DETECTED NOT DETECTED Final   Klebsiella oxytoca NOT DETECTED NOT DETECTED Final   Klebsiella pneumoniae NOT DETECTED NOT DETECTED Final   Proteus species NOT DETECTED NOT DETECTED Final   Serratia marcescens NOT DETECTED NOT DETECTED Final   Haemophilus influenzae NOT DETECTED NOT DETECTED Final   Neisseria meningitidis NOT DETECTED NOT DETECTED Final   Pseudomonas aeruginosa NOT DETECTED NOT DETECTED Final   Candida albicans NOT DETECTED NOT DETECTED Final   Candida glabrata NOT DETECTED NOT DETECTED Final   Candida krusei NOT DETECTED NOT DETECTED Final   Candida parapsilosis NOT DETECTED NOT DETECTED Final   Candida tropicalis NOT DETECTED NOT DETECTED Final  Blood culture (routine x 2)     Status: None (Preliminary result)   Collection Time: 04/05/17  4:00 PM  Result Value Ref Range Status   Specimen Description BLOOD RIGHT ANTECUBITAL  Final   Special Requests IN PEDIATRIC BOTTLE Blood Culture adequate volume  Final   Culture NO GROWTH 2 DAYS  Final   Report Status PENDING  Incomplete  MRSA PCR Screening     Status: None   Collection Time: 04/05/17  9:02 PM  Result Value Ref Range Status   MRSA by PCR NEGATIVE NEGATIVE Final    Comment:        The GeneXpert MRSA Assay (FDA approved for NASAL specimens only), is one component of a comprehensive MRSA colonization surveillance program. It is not intended to diagnose MRSA infection nor to guide or monitor treatment for MRSA infections.   Culture, sputum-assessment     Status: None   Collection Time: 04/05/17 11:52 PM  Result Value Ref Range Status   Specimen Description EXPECTORATED SPUTUM  Final   Special Requests Normal  Final   Sputum evaluation THIS SPECIMEN IS ACCEPTABLE FOR SPUTUM CULTURE  Final   Report Status 04/06/2017 FINAL   Final  Culture, respiratory (NON-Expectorated)     Status: None (Preliminary result)   Collection Time: 04/05/17 11:52 PM  Result Value Ref Range Status   Specimen Description EXPECTORATED SPUTUM  Final   Special Requests Normal Reflexed from [id number]  Final   Gram Stain   Final    MODERATE WBC PRESENT, PREDOMINANTLY  PMN FEW GRAM POSITIVE COCCI RARE GRAM NEGATIVE RODS RARE GRAM POSITIVE RODS RARE GRAM NEGATIVE COCCOBACILLI    Culture CULTURE REINCUBATED FOR BETTER GROWTH  Final   Report Status PENDING  Incomplete    Radiology Reports X-ray Chest Pa And Lateral  Result Date: 04/06/2017 CLINICAL DATA:  Shortness of breath, CHF, COPD, hypertension, clinical suspicion of pneumonia. EXAM: CHEST  2 VIEW COMPARISON:  Portable chest x-ray of April 05, 2017 FINDINGS: The lungs are well-expanded. There is an azygos lobe anatomy on the right. The pulmonary vascularity is less engorged today. The pulmonary interstitial markings have improved. The cardiac silhouette remains enlarged. There is calcification in the wall of the aortic arch. Small bilateral pleural effusions blunt the posterior costophrenic angles. IMPRESSION: Interval improvement in CHF.  Underlying COPD. Thoracic aortic atherosclerosis. Electronically Signed   By: David  Martinique M.D.   On: 04/06/2017 13:08   Dg Chest Portable 1 View  Result Date: 04/05/2017 CLINICAL DATA:  Shortness of breath and respiratory failure today. EXAM: PORTABLE CHEST 1 VIEW COMPARISON:  12/28/2016 and prior radiographs FINDINGS: Cardiomegaly noted with pulmonary vascular congestion. Peribronchial thickening is unchanged. Left pleural effusion and left basilar atelectasis has significantly decreased since 12/28/2016. No acute abnormalities are noted. IMPRESSION: Cardiomegaly and pulmonary vascular congestion Electronically Signed   By: Margarette Canada M.D.   On: 04/05/2017 14:46    Time Spent in minutes  35   Reyne Dumas M.D on 04/07/2017 at 12:51 PM  Between 7am  to 7pm - Pager - [phone]  After 7pm go to [email or web address] - password [facility]  [facility] -  Office  [phone]

## 2017-04-07 NOTE — Progress Notes (Signed)
Pharmacy Antibiotic Note  Dawn Montoya is a 83 y.o. female admitted from [facility] on 04/05/2017 with increasing cough and green, productive sputum.  Pharmacy is on board for Cefepime dosing. Respiratory cultures are noted to still be pending. Renal function has remained stable.   The patient is noted to be on lovenox for VTE prophylaxis with a calculated BMI of 45. Will adjust to 0.5 mg/kg  Plan: 1. Continue Cefepime 1g IV every 8 hours 2. Adjust Lovenox to 55 mg SQ every 24 hours for VTE prophylaxis 3. Will continue to follow renal function, culture results, LOT, and antibiotic de-escalation plans   Height: 5\' 3"  (160 cm) Weight: 253 lb 15.5 oz (115.2 kg) IBW/kg (Calculated) : 52.4  Temp (24hrs), Avg:98.6 F (37 C), Min:97.8 F (36.6 C), Max:99.1 F (37.3 C)   Recent Labs Lab 04/05/17 1426 04/05/17 1449 04/05/17 1615 04/05/17 2347 04/06/17 0420 04/07/17 0439  WBC  --   --  12.4*  --  6.4  --   CREATININE 0.75  --   --  0.82 0.72 0.82  LATICACIDVEN  --  1.64  --   --   --   --     Estimated Creatinine Clearance: 63.6 mL/min (by C-G formula based on SCr of 0.82 mg/dL).    No Known Allergies  Vanc 6/13 >> 6/14 Cefepime 6/13 >>  6/13 MRSA PCR >> negative 6/13 BCx >> 1/2 MRSE - likely contaminant 6/13 UCx - multiple species, needs recollect 6/13 Sputum cx >> pending  Thank you for allowing pharmacy to be a part of this patient's care.  Alycia Rossetti, PharmD, BCPS Clinical Pharmacist Pager: [phone] Clinical phone for 04/07/2017 from 7a-3:30p: [phone] If after 3:30p, please call main pharmacy at: [phone] 04/07/2017 10:53 AM

## 2017-04-07 NOTE — Progress Notes (Signed)
Results for DEARRA, MYHAND (MRN [id number]) as of 04/07/2017 13:30  Ref. Range 04/06/2017 12:29 04/06/2017 16:35 04/06/2017 20:58 04/07/2017 08:07 04/07/2017 12:51  Glucose-Capillary Latest Ref Range: 65 - 99 mg/dL 364 (H) 280 (H) 271 (H) 240 (H) 231 (H)  Noted that blood sugars continue to be greater than 200 mg/dl. Recommend increasing Novolog to MODERATE TID & HS if blood sugars continue to be elevated.   Harvel Ricks RN BSN CDE Diabetes Coordinator Pager: [phone]  8am-5pm

## 2017-04-07 NOTE — Progress Notes (Signed)
Dr. Allyson Sabal text paged about patient being very disoriented.

## 2017-04-08 LAB — CBC
HCT: 40.3 % (ref 36.0–46.0)
Hemoglobin: 12.6 g/dL (ref 12.0–15.0)
MCH: 31 pg (ref 26.0–34.0)
MCHC: 31.3 g/dL (ref 30.0–36.0)
MCV: 99.3 fL (ref 78.0–100.0)
PLATELETS: 325 10*3/uL (ref 150–400)
RBC: 4.06 MIL/uL (ref 3.87–5.11)
RDW: 14.5 % (ref 11.5–15.5)
WBC: 10.7 10*3/uL — AB (ref 4.0–10.5)

## 2017-04-08 LAB — GLUCOSE, CAPILLARY
GLUCOSE-CAPILLARY: 155 mg/dL — AB (ref 65–99)
GLUCOSE-CAPILLARY: 146 mg/dL — AB (ref 65–99)
Glucose-Capillary: 183 mg/dL — ABNORMAL HIGH (ref 65–99)
Glucose-Capillary: 143 mg/dL — ABNORMAL HIGH (ref 65–99)

## 2017-04-08 LAB — CULTURE, RESPIRATORY W GRAM STAIN
Culture: NORMAL
Special Requests: NORMAL

## 2017-04-08 LAB — COMPREHENSIVE METABOLIC PANEL
ALT: 16 U/L (ref 14–54)
AST: 15 U/L (ref 15–41)
Albumin: 2.9 g/dL — ABNORMAL LOW (ref 3.5–5.0)
Alkaline Phosphatase: 69 U/L (ref 38–126)
Anion gap: 9 (ref 5–15)
BUN: 27 mg/dL — ABNORMAL HIGH (ref 6–20)
CHLORIDE: 97 mmol/L — AB (ref 101–111)
CO2: 34 mmol/L — ABNORMAL HIGH (ref 22–32)
CREATININE: 0.81 mg/dL (ref 0.44–1.00)
Calcium: 9 mg/dL (ref 8.9–10.3)
Glucose, Bld: 197 mg/dL — ABNORMAL HIGH (ref 65–99)
POTASSIUM: 3.9 mmol/L (ref 3.5–5.1)
Sodium: 140 mmol/L (ref 135–145)
Total Bilirubin: 0.6 mg/dL (ref 0.3–1.2)
Total Protein: 6.2 g/dL — ABNORMAL LOW (ref 6.5–8.1)

## 2017-04-08 LAB — CULTURE, BLOOD (ROUTINE X 2): SPECIAL REQUESTS: ADEQUATE

## 2017-04-08 LAB — CULTURE, RESPIRATORY

## 2017-04-08 MED ORDER — AMOXICILLIN-POT CLAVULANATE 875-125 MG PO TABS
1.0000 | ORAL_TABLET | Freq: Two times a day (BID) | ORAL | Status: DC
  Administered 2017-04-08 – 2017-04-10 (×5): 1 via ORAL
  Filled 2017-04-08 (×5): qty 1

## 2017-04-08 MED ORDER — PREDNISONE 50 MG PO TABS
50.0000 mg | ORAL_TABLET | Freq: Every day | ORAL | Status: DC
  Administered 2017-04-09 – 2017-04-10 (×2): 50 mg via ORAL
  Filled 2017-04-08 (×2): qty 1

## 2017-04-08 NOTE — Progress Notes (Signed)
PROGRESS NOTE                                                                                                                                                                                                             Patient Demographics:    Dawn Montoya, is a 83 y.o. female, DOB - 1932/01/22, CBJ:[id number]  Admit date - 04/05/2017   Admitting Physician Louellen Molder, MD  Outpatient Primary MD for the patient is Hendricks Limes, MD  LOS - 3  Outpatient Specialists:NONE  Chief Complaint  Patient presents with  . Respiratory Distress       Brief Narrative  82 y.o. female, Resident of SNF with chronic hypercapnic respiratory failure secondary to COPD (on 3 L home O2), chronic prednisone, chronic kidney disease stage III, diastolic CHF, depression, peripheral neuropathy and hypertension who was brought to the ED by EMS with respiratory distress. Pt admitted with acute on chr hypoxic and hypercapnic respiratory failure with sepsis secondary to COPD exacerbation and HCAP. Also had acute metabolic encephalopathy.  Subjective  Patient was slightly confused yesterday evening, is more awake and alert today , And continues to have slight cough and wheeze   Assessment  & Plan :       Acute respiratory failure with hypoxia and hypercapnia (HCC) -combination of COPD with acute exacerbation and possible healthcare associated pneumonia. needed BiPAP initially  , Currently on 3 L of oxygen which is her baseline -  change IV Solu-Medrol 60 mg to oral prednisone. Scheduled DuoNeb's. continue Pulmicort and when necessary albuterol.  Empiric vancomycin and cefepime.day #3 , Discontinued and switched to Augmentin Continue Robitussin for cough    Ruled out for sepsis blood culture negative but BCID detected staph, staph coag negative 1/2 bottles, likely contaminant Secondary to healthcare associated pneumonia and COPD.  urine  strep antigen neg, pending  Legionella antigen.  Will switch antibiotics to Augmentin  Acute metabolic encephalopathy Secondary to sepsis and hypercapnic respiratory failure. Patient has been intermittently confused during this hospitalization hold her tramadol, Neurontin and Cymbalta.    Hypothyroidism Continue  Synthroid.TSH 2.95 01/26/17    CKD (chronic kidney disease) stage 3, GFR 30-59 ml/min Stable.  COPD with acute exacerbation Plan as outlined above   healthcare associated pneumonia Plan as above.  Chronic diastolic CHF Appears euvolemic . Continue lasix 40 mg PO  daily  elevated blood glucose  secondary to solumedrol. Placed on SSI      Code Status : DNR  Family Communication  : none at bedside. Granddaughter notified on admission.  Disposition Plan  : SNF Monday    Barriers For Discharge :active symptoms   Consults  :  none  Procedures  : none  DVT Prophylaxis  :  Lovenox  Lab Results  Component Value Date   PLT 325 04/08/2017    Antibiotics  :    Anti-infectives    Start     Dose/Rate Route Frequency Ordered Stop   04/06/17 1100  vancomycin (VANCOCIN) IVPB 750 mg/150 ml premix  Status:  Discontinued     750 mg 150 mL/hr over 60 Minutes Intravenous Every 12 hours 04/05/17 2124 04/06/17 1447   04/06/17 0000  ceFEPIme (MAXIPIME) 1 g in dextrose 5 % 50 mL IVPB     1 g 100 mL/hr over 30 Minutes Intravenous Every 8 hours 04/05/17 2124     04/05/17 2130  vancomycin (VANCOCIN) IVPB 1000 mg/200 mL premix     1,000 mg 200 mL/hr over 60 Minutes Intravenous NOW 04/05/17 2124 04/05/17 2350   04/05/17 1545  vancomycin (VANCOCIN) IVPB 1000 mg/200 mL premix     1,000 mg 200 mL/hr over 60 Minutes Intravenous  Once 04/05/17 1539 04/05/17 1741   04/05/17 1545  piperacillin-tazobactam (ZOSYN) IVPB 3.375 g     3.375 g 100 mL/hr over 30 Minutes Intravenous  Once 04/05/17 1539 04/05/17 1706        Objective:   Vitals:   04/07/17 2110 04/08/17 0038  04/08/17 0107 04/08/17 0628  BP:  (!) 143/69  (!) 146/72  Pulse:  69  (!) 52  Resp:  18  18  Temp:  98 F (36.7 C)  99.5 F (37.5 C)  TempSrc:  Oral  Oral  SpO2: 94% 98% 98% 98%  Weight:      Height:        Wt Readings from Last 3 Encounters:  04/07/17 113.9 kg (251 lb 1.7 oz)  04/04/17 115.9 kg (255 lb 8.2 oz)  03/24/17 115.9 kg (255 lb 9.6 oz)     Intake/Output Summary (Last 24 hours) at 04/08/17 0934 Last data filed at 04/08/17 0300  Gross per 24 hour  Intake              290 ml  Output              600 ml  Net             -310 ml     Physical Exam  Gen: not in distress HEENT:moist mucosa, supple neck Chest: By basilar wheezing CVS: N S1&S2, no murmurs, rubs or gallop GI: soft, NT, ND, BS+ Musculoskeletal: warm, trace edema CNS: AAOX1-2, non focal    Data Review:    CBC  Recent Labs Lab 04/05/17 1615 04/06/17 0420 04/08/17 0342  WBC 12.4* 6.4 10.7*  HGB 12.6 11.9* 12.6  HCT 40.4 38.6 40.3  PLT 239 252 325  MCV 100.2* 100.3* 99.3  MCH 31.3 30.9 31.0  MCHC 31.2 30.8 31.3  RDW 15.1 15.3 14.5  LYMPHSABS 0.7  --   --   MONOABS 0.7  --   --   EOSABS 0.0  --   --   BASOSABS 0.0  --   --     Chemistries   Recent Labs Lab 04/05/17 1426 04/05/17 2347 04/06/17 0420 04/07/17 0439 04/08/17 0342  NA  137  --  140 139 140  K 4.5  --  3.9 4.1 3.9  CL 97*  --  99* 96* 97*  CO2 30  --  30 33* 34*  GLUCOSE 231*  --  242* 251* 197*  BUN 14  --  18 23* 27*  CREATININE 0.75 0.82 0.72 0.82 0.81  CALCIUM 8.8*  --  8.6* 9.0 9.0  AST 20  --   --   --  15  ALT 16  --   --   --  16  ALKPHOS 81  --   --   --  69  BILITOT 1.5*  --   --   --  0.6   ------------------------------------------------------------------------------------------------------------------ No results for input(s): CHOL, HDL, LDLCALC, TRIG, CHOLHDL, LDLDIRECT in the last 72 hours.  Lab Results  Component Value Date   HGBA1C 8.7 03/17/2017    ------------------------------------------------------------------------------------------------------------------ No results for input(s): TSH, T4TOTAL, T3FREE, THYROIDAB in the last 72 hours.  Invalid input(s): FREET3 ------------------------------------------------------------------------------------------------------------------ No results for input(s): VITAMINB12, FOLATE, FERRITIN, TIBC, IRON, RETICCTPCT in the last 72 hours.  Coagulation profile No results for input(s): INR, PROTIME in the last 168 hours.  No results for input(s): DDIMER in the last 72 hours.  Cardiac Enzymes No results for input(s): CKMB, TROPONINI, MYOGLOBIN in the last 168 hours.  Invalid input(s): CK ------------------------------------------------------------------------------------------------------------------    Component Value Date/Time   BNP 140.2 (H) 04/05/2017 1615    Inpatient Medications  Scheduled Meds: . arformoterol  15 mcg Nebulization BID  . aspirin  81 mg Oral Daily  . benzocaine  1 application Mouth/Throat TID AC  . benzonatate  100 mg Oral TID  . budesonide  0.25 mg Nebulization BID  . carvedilol  3.125 mg Oral BID WC  . chlorhexidine  15 mL Mouth Rinse BID  . cholecalciferol  1,000 Units Oral Daily  . cycloSPORINE  1 drop Both Eyes BID  . dextromethorphan-guaiFENesin  2 tablet Oral BID  . enoxaparin (LOVENOX) injection  55 mg Subcutaneous Q24H  . furosemide  40 mg Oral Daily  . hydroxypropyl methylcellulose / hypromellose  1 drop Both Eyes BID  . insulin aspart  0-9 Units Subcutaneous TID WC  . ipratropium-albuterol  3 mL Nebulization Q6H  . irbesartan  37.5 mg Oral Daily  . levothyroxine  75 mcg Oral QAC breakfast  . mouth rinse  15 mL Mouth Rinse q12n4p  . methylPREDNISolone (SOLU-MEDROL) injection  60 mg Intravenous Daily  . pantoprazole  40 mg Oral Daily  . polyethylene glycol  17 g Oral Daily  . sodium chloride flush  3 mL Intravenous Q12H   Continuous  Infusions: . ceFEPime (MAXIPIME) IV 1 g (04/08/17 0818)   PRN Meds:.acetaminophen **OR** acetaminophen, albuterol, bisacodyl, guaiFENesin-dextromethorphan, guaiFENesin-dextromethorphan, magnesium hydroxide, nitroGLYCERIN, ondansetron **OR** ondansetron (ZOFRAN) IV, simethicone  Micro Results Recent Results (from the past 240 hour(s))  Urine culture     Status: Abnormal   Collection Time: 04/05/17 12:08 AM  Result Value Ref Range Status   Specimen Description URINE, RANDOM  Final   Special Requests NONE  Final   Culture MULTIPLE SPECIES PRESENT, SUGGEST RECOLLECTION (A)  Final   Report Status 04/07/2017 FINAL  Final  Blood culture (routine x 2)     Status: Abnormal (Preliminary result)   Collection Time: 04/05/17  2:38 PM  Result Value Ref Range Status   Specimen Description BLOOD BLOOD LEFT FOREARM  Final   Special Requests   Final    BOTTLES DRAWN AEROBIC AND ANAEROBIC Blood  Culture adequate volume   Culture  Setup Time   Final    GRAM POSITIVE COCCI IN CLUSTERS IN BOTH AEROBIC AND ANAEROBIC BOTTLES CRITICAL RESULT CALLED TO, READ BACK BY AND VERIFIED WITH: PHARMD B. MARTIN [id number] 04/06/2017 T. TYSOR    Culture (A)  Final    STAPHYLOCOCCUS SPECIES (COAGULASE NEGATIVE) THE SIGNIFICANCE OF ISOLATING THIS ORGANISM FROM A SINGLE SET OF BLOOD CULTURES WHEN MULTIPLE SETS ARE DRAWN IS UNCERTAIN. PLEASE NOTIFY THE MICROBIOLOGY DEPARTMENT WITHIN ONE WEEK IF SPECIATION AND SENSITIVITIES ARE REQUIRED.    Report Status PENDING  Incomplete  Blood Culture ID Panel (Reflexed)     Status: Abnormal   Collection Time: 04/05/17  2:38 PM  Result Value Ref Range Status   Enterococcus species NOT DETECTED NOT DETECTED Final   Listeria monocytogenes NOT DETECTED NOT DETECTED Final   Staphylococcus species DETECTED (A) NOT DETECTED Final    Comment: Methicillin (oxacillin) resistant coagulase negative staphylococcus. Possible blood culture contaminant (unless isolated from more than one blood culture draw  or clinical case suggests pathogenicity). No antibiotic treatment is indicated for blood  culture contaminants. CRITICAL RESULT CALLED TO, READ BACK BY AND VERIFIED WITH: Kerr [id number] 04/06/2017 T.TYSOR    Staphylococcus aureus NOT DETECTED NOT DETECTED Final   Methicillin resistance DETECTED (A) NOT DETECTED Final    Comment: CRITICAL RESULT CALLED TO, READ BACK BY AND VERIFIED WITH: PHARMD B. MARTIN [id number] 04/06/2017 T.TYSOR    Streptococcus species NOT DETECTED NOT DETECTED Final   Streptococcus agalactiae NOT DETECTED NOT DETECTED Final   Streptococcus pneumoniae NOT DETECTED NOT DETECTED Final   Streptococcus pyogenes NOT DETECTED NOT DETECTED Final   Acinetobacter baumannii NOT DETECTED NOT DETECTED Final   Enterobacteriaceae species NOT DETECTED NOT DETECTED Final   Enterobacter cloacae complex NOT DETECTED NOT DETECTED Final   Escherichia coli NOT DETECTED NOT DETECTED Final   Klebsiella oxytoca NOT DETECTED NOT DETECTED Final   Klebsiella pneumoniae NOT DETECTED NOT DETECTED Final   Proteus species NOT DETECTED NOT DETECTED Final   Serratia marcescens NOT DETECTED NOT DETECTED Final   Haemophilus influenzae NOT DETECTED NOT DETECTED Final   Neisseria meningitidis NOT DETECTED NOT DETECTED Final   Pseudomonas aeruginosa NOT DETECTED NOT DETECTED Final   Candida albicans NOT DETECTED NOT DETECTED Final   Candida glabrata NOT DETECTED NOT DETECTED Final   Candida krusei NOT DETECTED NOT DETECTED Final   Candida parapsilosis NOT DETECTED NOT DETECTED Final   Candida tropicalis NOT DETECTED NOT DETECTED Final  Blood culture (routine x 2)     Status: None (Preliminary result)   Collection Time: 04/05/17  4:00 PM  Result Value Ref Range Status   Specimen Description BLOOD RIGHT ANTECUBITAL  Final   Special Requests IN PEDIATRIC BOTTLE Blood Culture adequate volume  Final   Culture NO GROWTH 2 DAYS  Final   Report Status PENDING  Incomplete  MRSA PCR Screening     Status:  None   Collection Time: 04/05/17  9:02 PM  Result Value Ref Range Status   MRSA by PCR NEGATIVE NEGATIVE Final    Comment:        The GeneXpert MRSA Assay (FDA approved for NASAL specimens only), is one component of a comprehensive MRSA colonization surveillance program. It is not intended to diagnose MRSA infection nor to guide or monitor treatment for MRSA infections.   Culture, sputum-assessment     Status: None   Collection Time: 04/05/17 11:52 PM  Result Value Ref Range Status  Specimen Description EXPECTORATED SPUTUM  Final   Special Requests Normal  Final   Sputum evaluation THIS SPECIMEN IS ACCEPTABLE FOR SPUTUM CULTURE  Final   Report Status 04/06/2017 FINAL  Final  Culture, respiratory (NON-Expectorated)     Status: None (Preliminary result)   Collection Time: 04/05/17 11:52 PM  Result Value Ref Range Status   Specimen Description EXPECTORATED SPUTUM  Final   Special Requests Normal Reflexed from [id number]  Final   Gram Stain   Final    MODERATE WBC PRESENT, PREDOMINANTLY PMN FEW GRAM POSITIVE COCCI RARE GRAM NEGATIVE RODS RARE GRAM POSITIVE RODS RARE GRAM NEGATIVE COCCOBACILLI    Culture CULTURE REINCUBATED FOR BETTER GROWTH  Final   Report Status PENDING  Incomplete    Radiology Reports X-ray Chest Pa And Lateral  Result Date: 04/06/2017 CLINICAL DATA:  Shortness of breath, CHF, COPD, hypertension, clinical suspicion of pneumonia. EXAM: CHEST  2 VIEW COMPARISON:  Portable chest x-ray of April 05, 2017 FINDINGS: The lungs are well-expanded. There is an azygos lobe anatomy on the right. The pulmonary vascularity is less engorged today. The pulmonary interstitial markings have improved. The cardiac silhouette remains enlarged. There is calcification in the wall of the aortic arch. Small bilateral pleural effusions blunt the posterior costophrenic angles. IMPRESSION: Interval improvement in CHF.  Underlying COPD. Thoracic aortic atherosclerosis. Electronically Signed    By: David  Martinique M.D.   On: 04/06/2017 13:08   Dg Chest Portable 1 View  Result Date: 04/05/2017 CLINICAL DATA:  Shortness of breath and respiratory failure today. EXAM: PORTABLE CHEST 1 VIEW COMPARISON:  12/28/2016 and prior radiographs FINDINGS: Cardiomegaly noted with pulmonary vascular congestion. Peribronchial thickening is unchanged. Left pleural effusion and left basilar atelectasis has significantly decreased since 12/28/2016. No acute abnormalities are noted. IMPRESSION: Cardiomegaly and pulmonary vascular congestion Electronically Signed   By: Margarette Canada M.D.   On: 04/05/2017 14:46    Time Spent in minutes  35   Reyne Dumas M.D on 04/08/2017 at 9:34 AM  Between 7am to 7pm - Pager - [phone]  After 7pm go to [email or web address] - password [facility]  [facility] -  Office  [phone]

## 2017-04-08 NOTE — Evaluation (Signed)
Physical Therapy Evaluation Patient Details Name: Dawn Montoya MRN: [id number] DOB: Oct 19, 1932 Today's Date: 04/08/2017   History of Present Illness  Pt is an 83 yo female admitted to ED for COPD exacerbation and pneumonia. Past medical history significant fo rhypercpnic respiratory failure, COPD, chronic kidney disease, CHF, depression, and HTN. Prior to admission pt was a resident at a SNF. Pt was complaining of respiratory distress and hypoxia into the 70s.   Clinical Impression  Pt presents with decreased functional mobility secondary to deficits listed below. Prior to admission pt was residing in a SNF and required assistance with mobility and ADLs. Pt requires mod A for bed mobility and min A for transfers. Pt unable to tolerate gait due to poor activity tolerance and decreased O2 saturation. O2 sat on 3L supp O2 via nasal cannula upon sitting was 92%. O2 sats in recliner at end of session was 97% on 3L O2. Pt would benefit from cont skilled PT to improve activity tolerance, strength, and improve functional mobility. PT will follow acutely.     Follow Up Recommendations SNF;Supervision for mobility/OOB (Return to SNF)    Equipment Recommendations  None recommended by PT    Recommendations for Other Services       Precautions / Restrictions Precautions Precautions: Fall Restrictions Weight Bearing Restrictions: No      Mobility  Bed Mobility Overal bed mobility: Needs Assistance Bed Mobility: Supine to Sit     Supine to sit: Mod assist     General bed mobility comments: Pt required mod A to assist with power up to sitting due to decreased core strength.  Transfers Overall transfer level: Needs assistance Equipment used: Rolling walker (2 wheeled) Transfers: Sit to/from [facility] Sit to Stand: Min assist Stand pivot transfers: Min assist       General transfer comment: Pt complete sit to stand with min A but could not maintain upright standing for  >15 seconds. Pt completed stand stand transfer with min A to recline with RW.   Ambulation/Gait             General Gait Details: Pt reports she does not walk a lot at the SNF  Stairs            Wheelchair Mobility    Modified Rankin (Stroke Patients Only)       Balance Overall balance assessment: Needs assistance Sitting-balance support: No upper extremity supported;Feet supported Sitting balance-Leahy Scale: Fair     Standing balance support: Bilateral upper extremity supported;During functional activity Standing balance-Leahy Scale: Poor                               Pertinent Vitals/Pain Pain Assessment: No/denies pain    Home Living Family/patient expects to be discharged to:: Skilled nursing facility                      Prior Function Level of Independence: Needs assistance   Gait / Transfers Assistance Needed: Pt reports she has not walked a lot recently, but required RW  ADL's / Homemaking Assistance Needed: Pt requires assistance with bathing and self care        Hand Dominance        Extremity/Trunk Assessment   Upper Extremity Assessment Upper Extremity Assessment: Defer to OT evaluation    Lower Extremity Assessment Lower Extremity Assessment: Generalized weakness       Communication   Communication: No difficulties  Cognition Arousal/Alertness: Awake/alert Behavior During Therapy: WFL for tasks assessed/performed Overall Cognitive Status: Within Functional Limits for tasks assessed                                 General Comments: Pt hard of hearing       General Comments General comments (skin integrity, edema, etc.): O2 sat on 3L supp O2 via nasal cannula upon sitting was 92%. O2 sats in recliner at end of session was 97% on 3L O2    Exercises     Assessment/Plan    PT Assessment Patient needs continued PT services  PT Problem List Decreased strength;Decreased range of  motion;Decreased activity tolerance;Decreased balance;Decreased mobility;Decreased coordination;Decreased knowledge of use of DME;Decreased safety awareness;Cardiopulmonary status limiting activity       PT Treatment Interventions DME instruction;Gait training;Functional mobility training;Therapeutic activities;Therapeutic exercise;Neuromuscular re-education;Patient/family education    PT Goals (Current goals can be found in the Care Plan section)  Acute Rehab PT Goals Patient Stated Goal: to feel better PT Goal Formulation: With patient Time For Goal Achievement: 04/22/17 Potential to Achieve Goals: Good    Frequency Min 3X/week   Barriers to discharge        Co-evaluation               AM-PAC PT "6 Clicks" Daily Activity  Outcome Measure Difficulty turning over in bed (including adjusting bedclothes, sheets and blankets)?: Total Difficulty moving from lying on back to sitting on the side of the bed? : Total Difficulty sitting down on and standing up from a chair with arms (e.g., wheelchair, bedside commode, etc,.)?: Total Help needed moving to and from a bed to chair (including a wheelchair)?: A Little Help needed walking in hospital room?: A Lot Help needed climbing 3-5 steps with a railing? : Total 6 Click Score: 9    End of Session Equipment Utilized During Treatment: Gait belt;Oxygen Activity Tolerance: Patient limited by fatigue Patient left: in chair;with call bell/phone within reach;with chair alarm set Nurse Communication: Mobility status PT Visit Diagnosis: Unsteadiness on feet (R26.81);Muscle weakness (generalized) (M62.81);Difficulty in walking, not elsewhere classified (R26.2)    Time: 4827-0786 PT Time Calculation (min) (ACUTE ONLY): 18 min   Charges:   PT Evaluation $PT Eval Moderate Complexity: 1 Procedure     PT G CodesLoma Sousa, SPT  [phone]  Loma Sousa 04/08/2017, 1:11 PM

## 2017-04-09 LAB — GLUCOSE, CAPILLARY
GLUCOSE-CAPILLARY: 171 mg/dL — AB (ref 65–99)
GLUCOSE-CAPILLARY: 243 mg/dL — AB (ref 65–99)
GLUCOSE-CAPILLARY: 283 mg/dL — AB (ref 65–99)
Glucose-Capillary: 206 mg/dL — ABNORMAL HIGH (ref 65–99)

## 2017-04-09 LAB — COMPREHENSIVE METABOLIC PANEL
ALBUMIN: 2.8 g/dL — AB (ref 3.5–5.0)
ALK PHOS: 66 U/L (ref 38–126)
ALT: 17 U/L (ref 14–54)
ANION GAP: 10 (ref 5–15)
AST: 24 U/L (ref 15–41)
BUN: 25 mg/dL — ABNORMAL HIGH (ref 6–20)
CALCIUM: 8.8 mg/dL — AB (ref 8.9–10.3)
CO2: 31 mmol/L (ref 22–32)
CREATININE: 0.82 mg/dL (ref 0.44–1.00)
Chloride: 100 mmol/L — ABNORMAL LOW (ref 101–111)
GFR calc Af Amer: >60 mL/min (ref >60)
GFR calc non Af Amer: >60 mL/min (ref >60)
GLUCOSE: 188 mg/dL — AB (ref 65–99)
Potassium: 3.7 mmol/L (ref 3.5–5.1)
SODIUM: 141 mmol/L (ref 135–145)
Total Bilirubin: 1.1 mg/dL (ref 0.3–1.2)
Total Protein: 6 g/dL — ABNORMAL LOW (ref 6.5–8.1)

## 2017-04-09 LAB — VITAMIN B12: VITAMIN B 12: 310 pg/mL (ref 180–914)

## 2017-04-09 LAB — CBC
HCT: 43.4 % (ref 36.0–46.0)
HEMOGLOBIN: 13.2 g/dL (ref 12.0–15.0)
MCH: 30.6 pg (ref 26.0–34.0)
MCHC: 30.4 g/dL (ref 30.0–36.0)
MCV: 100.5 fL — ABNORMAL HIGH (ref 78.0–100.0)
Platelets: 303 10*3/uL (ref 150–400)
RBC: 4.32 MIL/uL (ref 3.87–5.11)
RDW: 14.9 % (ref 11.5–15.5)
WBC: 12.4 10*3/uL — ABNORMAL HIGH (ref 4.0–10.5)

## 2017-04-09 MED ORDER — INSULIN ASPART 100 UNIT/ML ~~LOC~~ SOLN
8.0000 [IU] | Freq: Once | SUBCUTANEOUS | Status: AC
Start: 2017-04-09 — End: 2017-04-09
  Administered 2017-04-09: 8 [IU] via SUBCUTANEOUS

## 2017-04-09 NOTE — Progress Notes (Addendum)
Pt is stable,vitals stable, appetite is poor, external catheter is in site, no any complain of pain, coughing on and off, thick sputum, worked with PT, will continue to monitor

## 2017-04-09 NOTE — Progress Notes (Addendum)
PROGRESS NOTE                                                                                                                                                                                                             Patient Demographics:    Dawn Montoya, is a 83 y.o. female, DOB - [birth year], BOF:[id number]  Admit date - 04/05/2017   Admitting Physician Louellen Molder, MD  Outpatient Primary MD for the patient is Hendricks Limes, MD  LOS - 4  Outpatient Specialists:None  Chief Complaint  Patient presents with  . Respiratory Distress       Brief Narrative   83 year old female resident of [facility] with chronic hypercapnic respiratory failure secondary to COPD (on 3 L of), chronic prednisone use, CT stage III, diastolic CHF, peripheral neuropathy, hypertension and depression brought to the ED by EMS for respiratory distress. Patient admitted to stepdown on chronic hypoxic and hypercapnic respiratory failure with sepsis (secondary to both COPD exacerbation and healthcare associated pneumonia) and acute metabolic encephalopathy.   Subjective:   Patient feels her breathing to be better. Denies chest pain. Her secretions or palpitations. Remains afebrile. She has some confusion , although that will oriented since admission.  Assessment/plan     Principal Problem:   Acute respiratory failure with hypoxia and hypercapnia (HCC) Secondary to combination of COPD exacerbation and healthcare associated pneumonia. Required BiPAP on admission overnight, currently on home dose oxygen (2 he Transitioned to oral prednisone, continue Pulmicort and DuoNeb. Antibiotic narrowed to oral Augmentin. Continue supportive care with antitussives.  Active Problems:   HCAP (healthcare-associated pneumonia) As outlined above. Cultures negative. Antibiotic added to Augmentin. Blood culture 1/2 gram-negative staph risk likely  contaminant.     Acute metabolic encephalopathy Possibly due to acute sepsis. Slowly improving. Held her home dose tramadol, Neurontin and Cymbalta. Patient repeated says she used to live in [place] until her daughter died. On talking to her granddaughter today, pts daughter died 2 weeks back and patient was told about this only recently. granddaughter feels pt is in a denial about her daughter recent death. TSH 2 months back was normal. Check B12 (high MCV).     Hypothyroidism Stable. Continue Synthroid.    CKD (chronic kidney disease) stage 3, GFR 30-59 ml/min Stable.    COPD exacerbation ([place]) Plan as outlined above.  Recurrent major depression ([place])    Severe obesity (BMI >= 40) (HCC)      Code Status :  DO NOT RESUSCITATE  Family Communication  : Return to [facility]  Disposition Plan  : return to   Barriers For Discharge :   Consults  : none  Procedures  : NONE  DVT Prophylaxis  :  Lovenox -   Lab Results  Component Value Date   PLT 303 04/09/2017    Antibiotics  :    Anti-infectives    Start     Dose/Rate Route Frequency Ordered Stop   04/08/17 1000  amoxicillin-clavulanate (AUGMENTIN) 875-125 MG per tablet 1 tablet     1 tablet Oral Every 12 hours 04/08/17 0942     04/06/17 1100  vancomycin (VANCOCIN) IVPB 750 mg/150 ml premix  Status:  Discontinued     750 mg 150 mL/hr over 60 Minutes Intravenous Every 12 hours 04/05/17 2124 04/06/17 1447   04/06/17 0000  ceFEPIme (MAXIPIME) 1 g in dextrose 5 % 50 mL IVPB  Status:  Discontinued     1 g 100 mL/hr over 30 Minutes Intravenous Every 8 hours 04/05/17 2124 04/08/17 0942   04/05/17 2130  vancomycin (VANCOCIN) IVPB 1000 mg/200 mL premix     1,000 mg 200 mL/hr over 60 Minutes Intravenous NOW 04/05/17 2124 04/05/17 2350   04/05/17 1545  vancomycin (VANCOCIN) IVPB 1000 mg/200 mL premix     1,000 mg 200 mL/hr over 60 Minutes Intravenous  Once 04/05/17 1539 04/05/17 1741   04/05/17 1545   piperacillin-tazobactam (ZOSYN) IVPB 3.375 g     3.375 g 100 mL/hr over 30 Minutes Intravenous  Once 04/05/17 1539 04/05/17 1706        Objective:   Vitals:   04/08/17 1236 04/08/17 2109 04/09/17 0447 04/09/17 1231  BP: (!) 119/54 139/73 (!) 133/57 (!) 96/48  Pulse: 69 75 76 93  Resp: 20 18 18 18   Temp: 98.8 F (37.1 C) 98.4 F (36.9 C) 98.2 F (36.8 C) 98.1 F (36.7 C)  TempSrc: Oral Oral Oral Oral  SpO2: 93% 95% 93% 96%  Weight:   113.2 kg (249 lb 9 oz)   Height:        Wt Readings from Last 3 Encounters:  04/09/17 113.2 kg (249 lb 9 oz)  04/04/17 115.9 kg (255 lb 8.2 oz)  03/24/17 115.9 kg (255 lb 9.6 oz)     Intake/Output Summary (Last 24 hours) at 04/09/17 1411 Last data filed at 04/09/17 0932  Gross per 24 hour  Intake              120 ml  Output              300 ml  Net             -180 ml     Physical Exam  Gen: not in distress HEENT:  moist mucosa, supple neck Chest: clear b/l, no added sounds CVS: N S1&S2, no murmurs,  GI: soft, NT, ND, BS+ Musculoskeletal: warm, no edema CNS: Alert and oriented to place, nonfocal   Data Review:    CBC  Recent Labs Lab 04/05/17 1615 04/06/17 0420 04/08/17 0342 04/09/17 0740  WBC 12.4* 6.4 10.7* 12.4*  HGB 12.6 11.9* 12.6 13.2  HCT 40.4 38.6 40.3 43.4  PLT 239 252 325 303  MCV 100.2* 100.3* 99.3 100.5*  MCH 31.3 30.9 31.0 30.6  MCHC 31.2 30.8 31.3 30.4  RDW 15.1 15.3 14.5 14.9  LYMPHSABS 0.7  --   --   --   MONOABS 0.7  --   --   --   EOSABS 0.0  --   --   --   BASOSABS 0.0  --   --   --     Chemistries   Recent Labs Lab 04/05/17 1426 04/05/17 2347 04/06/17 0420 04/07/17 0439 04/08/17 0342 04/09/17 0740  NA 137  --  140 139 140 141  K 4.5  --  3.9 4.1 3.9 3.7  CL 97*  --  99* 96* 97* 100*  CO2 30  --  30 33* 34* 31  GLUCOSE 231*  --  242* 251* 197* 188*  BUN 14  --  18 23* 27* 25*  CREATININE 0.75 0.82 0.72 0.82 0.81 0.82  CALCIUM 8.8*  --  8.6* 9.0 9.0 8.8*  AST 20  --   --   --   15 24  ALT 16  --   --   --  16 17  ALKPHOS 81  --   --   --  69 66  BILITOT 1.5*  --   --   --  0.6 1.1   ------------------------------------------------------------------------------------------------------------------ No results for input(s): CHOL, HDL, LDLCALC, TRIG, CHOLHDL, LDLDIRECT in the last 72 hours.  Lab Results  Component Value Date   HGBA1C 8.7 03/17/2017   ------------------------------------------------------------------------------------------------------------------ No results for input(s): TSH, T4TOTAL, T3FREE, THYROIDAB in the last 72 hours.  Invalid input(s): FREET3 ------------------------------------------------------------------------------------------------------------------ No results for input(s): VITAMINB12, FOLATE, FERRITIN, TIBC, IRON, RETICCTPCT in the last 72 hours.  Coagulation profile No results for input(s): INR, PROTIME in the last 168 hours.  No results for input(s): DDIMER in the last 72 hours.  Cardiac Enzymes No results for input(s): CKMB, TROPONINI, MYOGLOBIN in the last 168 hours.  Invalid input(s): CK ------------------------------------------------------------------------------------------------------------------    Component Value Date/Time   BNP 140.2 (H) 04/05/2017 1615    Inpatient Medications  Scheduled Meds: . amoxicillin-clavulanate  1 tablet Oral Q12H  . arformoterol  15 mcg Nebulization BID  . aspirin  81 mg Oral Daily  . benzocaine  1 application Mouth/Throat TID AC  . benzonatate  100 mg Oral TID  . budesonide  0.25 mg Nebulization BID  . carvedilol  3.125 mg Oral BID WC  . chlorhexidine  15 mL Mouth Rinse BID  . cholecalciferol  1,000 Units Oral Daily  . cycloSPORINE  1 drop Both Eyes BID  . dextromethorphan-guaiFENesin  2 tablet Oral BID  . enoxaparin (LOVENOX) injection  55 mg Subcutaneous Q24H  . furosemide  40 mg Oral Daily  . hydroxypropyl methylcellulose / hypromellose  1 drop Both Eyes BID  . insulin  aspart  0-9 Units Subcutaneous TID WC  . ipratropium-albuterol  3 mL Nebulization Q6H  . irbesartan  37.5 mg Oral Daily  . levothyroxine  75 mcg Oral QAC breakfast  . mouth rinse  15 mL Mouth Rinse q12n4p  . pantoprazole  40 mg Oral Daily  . polyethylene glycol  17 g Oral Daily  . predniSONE  50 mg Oral Q breakfast  . sodium chloride flush  3 mL Intravenous Q12H   Continuous Infusions: PRN Meds:.acetaminophen **OR** acetaminophen, albuterol, bisacodyl, guaiFENesin-dextromethorphan, guaiFENesin-dextromethorphan, magnesium hydroxide, nitroGLYCERIN, ondansetron **OR** ondansetron (ZOFRAN) IV, simethicone  Micro Results Recent Results (from the past 240 hour(s))  Urine culture     Status: Abnormal   Collection Time: 04/05/17 12:08 AM  Result Value Ref Range Status   Specimen Description URINE, RANDOM  Final  Special Requests NONE  Final   Culture MULTIPLE SPECIES PRESENT, SUGGEST RECOLLECTION (A)  Final   Report Status 04/07/2017 FINAL  Final  Blood culture (routine x 2)     Status: Abnormal   Collection Time: 04/05/17  2:38 PM  Result Value Ref Range Status   Specimen Description BLOOD BLOOD LEFT FOREARM  Final   Special Requests   Final    BOTTLES DRAWN AEROBIC AND ANAEROBIC Blood Culture adequate volume   Culture  Setup Time   Final    GRAM POSITIVE COCCI IN CLUSTERS IN BOTH AEROBIC AND ANAEROBIC BOTTLES CRITICAL RESULT CALLED TO, READ BACK BY AND VERIFIED WITH: PHARMD B. CBULAG [id number] 04/06/2017 T. TYSOR    Culture (A)  Final    STAPHYLOCOCCUS SPECIES (COAGULASE NEGATIVE) THE SIGNIFICANCE OF ISOLATING THIS ORGANISM FROM A SINGLE SET OF BLOOD CULTURES WHEN MULTIPLE SETS ARE DRAWN IS UNCERTAIN. PLEASE NOTIFY THE MICROBIOLOGY DEPARTMENT WITHIN ONE WEEK IF SPECIATION AND SENSITIVITIES ARE REQUIRED.    Report Status 04/08/2017 FINAL  Final  Blood Culture ID Panel (Reflexed)     Status: Abnormal   Collection Time: 04/05/17  2:38 PM  Result Value Ref Range Status   Enterococcus  species NOT DETECTED NOT DETECTED Final   Listeria monocytogenes NOT DETECTED NOT DETECTED Final   Staphylococcus species DETECTED (A) NOT DETECTED Final    Comment: Methicillin (oxacillin) resistant coagulase negative staphylococcus. Possible blood culture contaminant (unless isolated from more than one blood culture draw or clinical case suggests pathogenicity). No antibiotic treatment is indicated for blood  culture contaminants. CRITICAL RESULT CALLED TO, READ BACK BY AND VERIFIED WITH: [place] [id number] 04/06/2017 T.TYSOR    Staphylococcus aureus NOT DETECTED NOT DETECTED Final   Methicillin resistance DETECTED (A) NOT DETECTED Final    Comment: CRITICAL RESULT CALLED TO, READ BACK BY AND VERIFIED WITH: PHARMD B. MARTIN [id number] 04/06/2017 T.TYSOR    Streptococcus species NOT DETECTED NOT DETECTED Final   Streptococcus agalactiae NOT DETECTED NOT DETECTED Final   Streptococcus pneumoniae NOT DETECTED NOT DETECTED Final   Streptococcus pyogenes NOT DETECTED NOT DETECTED Final   Acinetobacter baumannii NOT DETECTED NOT DETECTED Final   Enterobacteriaceae species NOT DETECTED NOT DETECTED Final   Enterobacter cloacae complex NOT DETECTED NOT DETECTED Final   Escherichia coli NOT DETECTED NOT DETECTED Final   Klebsiella oxytoca NOT DETECTED NOT DETECTED Final   Klebsiella pneumoniae NOT DETECTED NOT DETECTED Final   Proteus species NOT DETECTED NOT DETECTED Final   Serratia marcescens NOT DETECTED NOT DETECTED Final   Haemophilus influenzae NOT DETECTED NOT DETECTED Final   Neisseria meningitidis NOT DETECTED NOT DETECTED Final   Pseudomonas aeruginosa NOT DETECTED NOT DETECTED Final   Candida albicans NOT DETECTED NOT DETECTED Final   Candida glabrata NOT DETECTED NOT DETECTED Final   Candida krusei NOT DETECTED NOT DETECTED Final   Candida parapsilosis NOT DETECTED NOT DETECTED Final   Candida tropicalis NOT DETECTED NOT DETECTED Final  Blood culture (routine x 2)     Status: None  (Preliminary result)   Collection Time: 04/05/17  4:00 PM  Result Value Ref Range Status   Specimen Description BLOOD RIGHT ANTECUBITAL  Final   Special Requests IN PEDIATRIC BOTTLE Blood Culture adequate volume  Final   Culture NO GROWTH 4 DAYS  Final   Report Status PENDING  Incomplete  MRSA PCR Screening     Status: None   Collection Time: 04/05/17  9:02 PM  Result Value Ref Range Status   MRSA by  PCR NEGATIVE NEGATIVE Final    Comment:        The GeneXpert MRSA Assay (FDA approved for NASAL specimens only), is one component of a comprehensive MRSA colonization surveillance program. It is not intended to diagnose MRSA infection nor to guide or monitor treatment for MRSA infections.   Culture, sputum-assessment     Status: None   Collection Time: 04/05/17 11:52 PM  Result Value Ref Range Status   Specimen Description EXPECTORATED SPUTUM  Final   Special Requests Normal  Final   Sputum evaluation THIS SPECIMEN IS ACCEPTABLE FOR SPUTUM CULTURE  Final   Report Status 04/06/2017 FINAL  Final  Culture, respiratory (NON-Expectorated)     Status: None   Collection Time: 04/05/17 11:52 PM  Result Value Ref Range Status   Specimen Description EXPECTORATED SPUTUM  Final   Special Requests Normal Reflexed from [id number]  Final   Gram Stain   Final    MODERATE WBC PRESENT, PREDOMINANTLY PMN FEW GRAM POSITIVE COCCI RARE GRAM NEGATIVE RODS RARE GRAM POSITIVE RODS RARE GRAM NEGATIVE COCCOBACILLI    Culture Consistent with normal respiratory flora.  Final   Report Status 04/08/2017 FINAL  Final    Radiology Reports X-ray Chest Pa And Lateral  Result Date: 04/06/2017 CLINICAL DATA:  Shortness of breath, CHF, COPD, hypertension, clinical suspicion of pneumonia. EXAM: CHEST  2 VIEW COMPARISON:  Portable chest x-ray of April 05, 2017 FINDINGS: The lungs are well-expanded. There is an azygos lobe anatomy on the right. The pulmonary vascularity is less engorged today. The pulmonary  interstitial markings have improved. The cardiac silhouette remains enlarged. There is calcification in the wall of the aortic arch. Small bilateral pleural effusions blunt the posterior costophrenic angles. IMPRESSION: Interval improvement in CHF.  Underlying COPD. Thoracic aortic atherosclerosis. Electronically Signed   By: David  Martinique M.D.   On: 04/06/2017 13:08   Dg Chest Portable 1 View  Result Date: 04/05/2017 CLINICAL DATA:  Shortness of breath and respiratory failure today. EXAM: PORTABLE CHEST 1 VIEW COMPARISON:  12/28/2016 and prior radiographs FINDINGS: Cardiomegaly noted with pulmonary vascular congestion. Peribronchial thickening is unchanged. Left pleural effusion and left basilar atelectasis has significantly decreased since 12/28/2016. No acute abnormalities are noted. IMPRESSION: Cardiomegaly and pulmonary vascular congestion Electronically Signed   By: Margarette Canada M.D.   On: 04/05/2017 14:46    Time Spent in minutes  25   Louellen Molder M.D on 04/09/2017 at 2:11 PM  Between 7am to 7pm - Pager - [phone]  After 7pm go to [email or web address] - password [facility]  [facility] -  Office  [phone]

## 2017-04-09 NOTE — Progress Notes (Signed)
Blood sugar 283 no HS coverage. Page MD Olevia Bowens on call. Novolog 8 units one time dose ordered for now.   Will continue to monitor.  Demoni Parmar, RN

## 2017-04-10 ENCOUNTER — Telehealth: Payer: Self-pay

## 2017-04-10 DIAGNOSIS — N183 Chronic kidney disease, stage 3 (moderate): Secondary | ICD-10-CM

## 2017-04-10 DIAGNOSIS — D518 Other vitamin B12 deficiency anemias: Secondary | ICD-10-CM

## 2017-04-10 DIAGNOSIS — A419 Sepsis, unspecified organism: Secondary | ICD-10-CM | POA: Diagnosis present

## 2017-04-10 LAB — GLUCOSE, CAPILLARY
GLUCOSE-CAPILLARY: 159 mg/dL — AB (ref 65–99)
GLUCOSE-CAPILLARY: 194 mg/dL — AB (ref 65–99)

## 2017-04-10 LAB — CULTURE, BLOOD (ROUTINE X 2)
CULTURE: NO GROWTH
Special Requests: ADEQUATE

## 2017-04-10 MED ORDER — TRAMADOL HCL 50 MG PO TABS: 50.0000 mg | ORAL_TABLET | Freq: Three times a day (TID) | ORAL | 0 refills | Status: DC | PRN

## 2017-04-10 MED ORDER — PREDNISONE 20 MG PO TABS: 20.0000 mg | ORAL_TABLET | Freq: Every day | ORAL | 0 refills | Status: DC

## 2017-04-10 MED ORDER — AMOXICILLIN-POT CLAVULANATE 875-125 MG PO TABS
1.0000 | ORAL_TABLET | Freq: Two times a day (BID) | ORAL | 0 refills | Status: AC
Start: 2017-04-10 — End: 2017-04-13

## 2017-04-10 MED ORDER — CYANOCOBALAMIN 1000 MCG PO TABS: 1000.0000 ug | ORAL_TABLET | Freq: Every day | ORAL | 0 refills | Status: DC

## 2017-04-10 NOTE — Progress Notes (Signed)
OT Cancellation Note  Patient Details Name: Dawn Montoya MRN: [id number] DOB: May 07, 1932   Cancelled Treatment:    Reason Eval/Treat Not Completed: Patient declined, no reason specified. Pt declining to work with OT at this time. Plan for return to SNF today. Will continue to follow up for OT eval as time allows.  Binnie Kand M.S., OTR/L Pager: [phone]  04/10/2017, 11:25 AM

## 2017-04-10 NOTE — Progress Notes (Signed)
Inpatient Diabetes Program Recommendations  AACE/ADA: New Consensus Statement on Inpatient Glycemic Control (2015)  Target Ranges:  Prepandial:   less than 140 mg/dL      Peak postprandial:   less than 180 mg/dL (1-2 hours)      Critically ill patients:  140 - 180 mg/dL   Results for Dawn, Montoya (MRN [id number]) as of 04/10/2017 09:48  Ref. Range 04/09/2017 07:54 04/09/2017 11:36 04/09/2017 16:35 04/09/2017 21:13  Glucose-Capillary Latest Ref Range: 65 - 99 mg/dL 171 (H) 206 (H) 243 (H) 283 (H)    Diabetes history: DM 2 Diagnosed 03/24/2017  Outpatient Diabetes meds: Metformin 500 mg BID, patient also taking steroids  Current orders: Novolog Sensitive Correction Scale/ SSI (0-9 units) TID AC     Note on 6/1 Sherrie Mustache NP at patient's facility noted: Type 2 diabetes mellitus with diabetic neuropathy, without long-term current use of insulin (Alpha) New diagnosis after blood sguar found to be 306 on BMP, A1c at 8.7. RD to provide diabetic education, will change to diabetic diet. CBG ACHS To start metformin 500 mg daily for 1 week then to increase to twice daily      MD- Note patient with poor PO intake at present.  Only eating 30% of meals.  Afternoon CBGs elevated.  Please consider increasing Novolog SSI to Moderate scale (0-15 units) TID AC + HS      --Will follow patient during hospitalization--  Wyn Quaker RN, MSN, CDE Diabetes Coordinator Inpatient Glycemic Control Team Team Pager: [phone] (8a-5p)

## 2017-04-10 NOTE — Telephone Encounter (Signed)
This is a patient of [place], who was admitted to [facility] after hospitalization. [facility] F/U is needed. Hospital discharge from [facility] on 04/10/2017.

## 2017-04-10 NOTE — Progress Notes (Signed)
Report given to nurse at [facility]

## 2017-04-10 NOTE — Discharge Summary (Signed)
Physician Discharge Summary  Dawn Montoya LKG:[id number] DOB: 1932/04/03 DOA: 04/05/2017  PCP: Hendricks Limes, MD  Admit date: 04/05/2017 Discharge date: 04/10/2017  Admitted From: SNF Disposition:  SNF ( [facility])  Recommendations for Outpatient Follow-up:  1. Follow up with MD at SNF in 1 week   Equipment/Devices: Oxygen ( 3L/ MIN)  Discharge Condition:fair CODE STATUS: Do not resuscitate (DNR) Diet recommendation:  Heart healthy    Discharge Diagnoses:  Principal Problem:   Acute respiratory failure with hypoxia and hypercapnia (HCC)  Active Problems:   HCAP (healthcare-associated pneumonia)   Acute metabolic encephalopathy   Sepsis ([place])   COPD exacerbation ([place])   Hypothyroidism   CKD (chronic kidney disease) stage 3, GFR 30-59 ml/min   Recurrent major depression (HCC)   Severe obesity (BMI >= 40) (HCC)  Brief narrative/ HPI Please refer to admission H&P for details, in brief, 83 year old female resident of [facility] with chronic hypercapnic respiratory failure secondary to COPD (on 3 L of), chronic prednisone use, CT stage III, diastolic CHF, peripheral neuropathy, hypertension and depression brought to the ED by EMS for respiratory distress. Patient admitted to stepdown on chronic hypoxic and hypercapnic respiratory failure with sepsis (secondary to both COPD exacerbation and healthcare associated pneumonia) and acute metabolic encephalopathy.  Hospital course   Principal Problem:   Acute respiratory failure with hypoxia and hypercapnia (HCC) Secondary to combination of COPD exacerbation and healthcare associated pneumonia. Required BiPAP on admission overnight, currently on home dose oxygen (3L/ min).  Symptoms improved.  Transitioned to oral prednisone with taper over next 12 days,, continue Pulmicort and DuoNeb. Antibiotic narrowed to oral Augmentin..   Active Problems:   HCAP (healthcare-associated pneumonia) As outlined above. Cultures negative.  Antibiotic switched  to Augmentin (total 7 days course). Blood culture 1/2 grew coag -negative staph,   likely contaminant.     Acute metabolic encephalopathy Possibly due to acute sepsis. Slowly improving. Held her home dose tramadol, Neurontin and Cymbalta. ( resume upon discharge). Patient repeated says she used to live in [place] until her daughter died. On talking to her granddaughter today, pts daughter died 2 weeks back and patient was told about this only recently. granddaughter feels pt is in a denial about her daughter recent death. TSH 2 months back was normal.  B12 low normal. (310). Will add supplement.     Hypothyroidism Stable. Continue Synthroid.    CKD (chronic kidney disease) stage 3, GFR 30-59 ml/min Stable.    COPD exacerbation ([place]) Plan as outlined above.    Recurrent major depression ([place]) resumed home meds.    Severe obesity (BMI >= 40) (HCC)       Family Communication  : D/w granddaughter on the phone  Disposition Plan  : return to SNF Consults  : none  Procedures  : NONE   Discharge Instructions   Allergies as of 04/10/2017   No Known Allergies     Medication List    STOP taking these medications   azithromycin 250 MG tablet Commonly known as:  ZITHROMAX     TAKE these medications   acetaminophen 500 MG tablet Commonly known as:  TYLENOL Take 500 mg by mouth every 6 (six) hours as needed (FOR PAIN).   albuterol 108 (90 Base) MCG/ACT inhaler Commonly known as:  PROVENTIL HFA;VENTOLIN HFA Inhale 2 puffs into the lungs every 6 (six) hours as needed for wheezing or shortness of breath.   amoxicillin-clavulanate 875-125 MG tablet Commonly known as:  AUGMENTIN Take 1 tablet by mouth  every 12 (twelve) hours.   arformoterol 15 MCG/2ML Nebu Commonly known as:  BROVANA Take 2 mLs (15 mcg total) by nebulization 2 (two) times daily.   ARTIFICIAL TEAR SOLUTION OP Place 1 drop into both eyes 4 (four) times daily as  needed (for dry eyes).   aspirin 81 MG chewable tablet Chew 1 tablet (81 mg total) by mouth daily.   benzocaine 10 % mucosal gel Commonly known as:  ORAJEL Use as directed 1 application in the mouth or throat 3 (three) times daily before meals. FOR GUM PAIN   benzonatate 100 MG capsule Commonly known as:  TESSALON Take 1 capsule (100 mg total) by mouth 3 (three) times daily.   bisacodyl 10 MG suppository Commonly known as:  DULCOLAX Place 10 mg rectally once as needed for mild constipation.   budesonide 0.25 MG/2ML nebulizer solution Commonly known as:  PULMICORT Take 0.25 mg by nebulization 2 (two) times daily.   carvedilol 3.125 MG tablet Commonly known as:  COREG Take 3.125 mg by mouth 2 (two) times daily with a meal. Hold for HR less than 60 and BP less than 100.   cholecalciferol 1000 units tablet Commonly known as:  VITAMIN D Take 1,000 Units by mouth daily. Reported on 04/11/2016   cycloSPORINE 0.05 % ophthalmic emulsion Commonly known as:  RESTASIS Place 1 drop into both eyes 2 (two) times daily.   DULoxetine 60 MG capsule Commonly known as:  CYMBALTA Take 1 capsule (60 mg total) by mouth daily.   furosemide 40 MG tablet Commonly known as:  LASIX Take 40 mg by mouth daily. FOR CHF   gabapentin 300 MG capsule Commonly known as:  NEURONTIN Take 300 mg by mouth 3 (three) times daily.   ipratropium-albuterol 0.5-2.5 (3) MG/3ML Soln Commonly known as:  DUONEB Take 3 mLs by nebulization every 6 (six) hours. FOR COPD   levothyroxine 75 MCG tablet Commonly known as:  SYNTHROID, LEVOTHROID Take 75 mcg by mouth daily before breakfast.   magnesium hydroxide 400 MG/5ML suspension Commonly known as:  MILK OF MAGNESIA Take 30 mLs by mouth once as needed for mild constipation.   Melatonin 3 MG Tabs Take 3 mg by mouth at bedtime.   MUCINEX DM MAXIMUM STRENGTH 60-1200 MG Tb12 Take 1 tablet by mouth 2 (two) times daily. Reported on 04/11/2016   nitroGLYCERIN 0.4 MG  SL tablet Commonly known as:  NITROSTAT Place 0.4 mg under the tongue every 5 (five) minutes as needed for chest pain. CALL MD IF NO RELIEF AFTER 3 DOSES   OXYGEN Inhale 3 L into the lungs continuous. FOR HYPOXIA   pantoprazole 40 MG tablet Commonly known as:  PROTONIX Take 40 mg by mouth daily.   polyethylene glycol packet Commonly known as:  MIRALAX / GLYCOLAX Take 17 g by mouth daily.   predniSONE 20 MG tablet Commonly known as:  DELTASONE Take 1 tablet (20 mg total) by mouth daily with breakfast. What changed:  medication strength  how much to take  when to take this  additional instructions   RA SALINE ENEMA 19-7 GM/118ML Enem Place 1 each rectally once as needed (for constipation). AND CALL PHYSICIAN IF NO RELIEF FROM THIS   simethicone 80 MG chewable tablet Commonly known as:  MYLICON Chew 1 tablet (80 mg total) by mouth 4 (four) times daily as needed for flatulence.   SYSTANE BALANCE 0.6 % Soln Generic drug:  Propylene Glycol Place 1 drop into both eyes 2 (two) times daily.   traMADol  50 MG tablet Commonly known as:  ULTRAM Take 1 tablet (50 mg total) by mouth every 8 (eight) hours as needed for severe pain.   valsartan 40 MG tablet Commonly known as:  DIOVAN Take 20 mg by mouth daily.   Vitamin B12 tablet ( cyanocobalamin) Take 1 tablet 1000 mg by mouth daily   Contact information for after-discharge care    Destination    [facility] Follow up.   Specialty:  [place] information: [address] [address] [phone]             No Known Allergies    Procedures/Studies: X-ray Chest Pa And Lateral  Result Date: 04/06/2017 CLINICAL DATA:  Shortness of breath, CHF, COPD, hypertension, clinical suspicion of pneumonia. EXAM: CHEST  2 VIEW COMPARISON:  Portable chest x-ray of April 05, 2017 FINDINGS: The lungs are well-expanded. There is an azygos lobe anatomy on the  right. The pulmonary vascularity is less engorged today. The pulmonary interstitial markings have improved. The cardiac silhouette remains enlarged. There is calcification in the wall of the aortic arch. Small bilateral pleural effusions blunt the posterior costophrenic angles. IMPRESSION: Interval improvement in CHF.  Underlying COPD. Thoracic aortic atherosclerosis. Electronically Signed   By: David  Martinique M.D.   On: 04/06/2017 13:08   Dg Chest Portable 1 View  Result Date: 04/05/2017 CLINICAL DATA:  Shortness of breath and respiratory failure today. EXAM: PORTABLE CHEST 1 VIEW COMPARISON:  12/28/2016 and prior radiographs FINDINGS: Cardiomegaly noted with pulmonary vascular congestion. Peribronchial thickening is unchanged. Left pleural effusion and left basilar atelectasis has significantly decreased since 12/28/2016. No acute abnormalities are noted. IMPRESSION: Cardiomegaly and pulmonary vascular congestion Electronically Signed   By: Margarette Canada M.D.   On: 04/05/2017 14:46       Subjective: Breathing much better. appears more oriented today.  Discharge Exam: Vitals:   04/09/17 2036 04/10/17 0529  BP: (!) 108/57 132/75  Pulse: 75 72  Resp: 18 18  Temp: 98.8 F (37.1 C) 98.9 F (37.2 C)   Vitals:   04/10/17 0529 04/10/17 0853 04/10/17 0854 04/10/17 0855  BP: 132/75     Pulse: 72     Resp: 18     Temp: 98.9 F (37.2 C)     TempSrc: Oral     SpO2: 96% (!) 89% (!) 89% 91%  Weight: 112.8 kg (248 lb 10.9 oz)     Height:        Gen: not in distress HEENT:  moist mucosa, supple neck Chest: clear b/l, no added sounds CVS: N S1&S2, no murmurs,  GI: soft, NT, ND,  Musculoskeletal: warm, no edema CNS: Alert and oriented x2, nonfocal    The results of significant diagnostics from this hospitalization (including imaging, microbiology, ancillary and laboratory) are listed below for reference.     Microbiology: Recent Results (from the past 240 hour(s))  Urine culture      Status: Abnormal   Collection Time: 04/05/17 12:08 AM  Result Value Ref Range Status   Specimen Description URINE, RANDOM  Final   Special Requests NONE  Final   Culture MULTIPLE SPECIES PRESENT, SUGGEST RECOLLECTION (A)  Final   Report Status 04/07/2017 FINAL  Final  Blood culture (routine x 2)     Status: Abnormal   Collection Time: 04/05/17  2:38 PM  Result Value Ref Range Status   Specimen Description BLOOD BLOOD LEFT FOREARM  Final   Special Requests   Final  BOTTLES DRAWN AEROBIC AND ANAEROBIC Blood Culture adequate volume   Culture  Setup Time   Final    GRAM POSITIVE COCCI IN CLUSTERS IN BOTH AEROBIC AND ANAEROBIC BOTTLES CRITICAL RESULT CALLED TO, READ BACK BY AND VERIFIED WITH: PHARMD B. MARTIN [id number] 04/06/2017 T. TYSOR    Culture (A)  Final    STAPHYLOCOCCUS SPECIES (COAGULASE NEGATIVE) THE SIGNIFICANCE OF ISOLATING THIS ORGANISM FROM A SINGLE SET OF BLOOD CULTURES WHEN MULTIPLE SETS ARE DRAWN IS UNCERTAIN. PLEASE NOTIFY THE MICROBIOLOGY DEPARTMENT WITHIN ONE WEEK IF SPECIATION AND SENSITIVITIES ARE REQUIRED.    Report Status 04/08/2017 FINAL  Final  Blood Culture ID Panel (Reflexed)     Status: Abnormal   Collection Time: 04/05/17  2:38 PM  Result Value Ref Range Status   Enterococcus species NOT DETECTED NOT DETECTED Final   Listeria monocytogenes NOT DETECTED NOT DETECTED Final   Staphylococcus species DETECTED (A) NOT DETECTED Final    Comment: Methicillin (oxacillin) resistant coagulase negative staphylococcus. Possible blood culture contaminant (unless isolated from more than one blood culture draw or clinical case suggests pathogenicity). No antibiotic treatment is indicated for blood  culture contaminants. CRITICAL RESULT CALLED TO, READ BACK BY AND VERIFIED WITH: [place] [id number] 04/06/2017 T.TYSOR    Staphylococcus aureus NOT DETECTED NOT DETECTED Final   Methicillin resistance DETECTED (A) NOT DETECTED Final    Comment: CRITICAL RESULT CALLED TO, READ  BACK BY AND VERIFIED WITH: PHARMD B. MARTIN [id number] 04/06/2017 T.TYSOR    Streptococcus species NOT DETECTED NOT DETECTED Final   Streptococcus agalactiae NOT DETECTED NOT DETECTED Final   Streptococcus pneumoniae NOT DETECTED NOT DETECTED Final   Streptococcus pyogenes NOT DETECTED NOT DETECTED Final   Acinetobacter baumannii NOT DETECTED NOT DETECTED Final   Enterobacteriaceae species NOT DETECTED NOT DETECTED Final   Enterobacter cloacae complex NOT DETECTED NOT DETECTED Final   Escherichia coli NOT DETECTED NOT DETECTED Final   Klebsiella oxytoca NOT DETECTED NOT DETECTED Final   Klebsiella pneumoniae NOT DETECTED NOT DETECTED Final   Proteus species NOT DETECTED NOT DETECTED Final   Serratia marcescens NOT DETECTED NOT DETECTED Final   Haemophilus influenzae NOT DETECTED NOT DETECTED Final   Neisseria meningitidis NOT DETECTED NOT DETECTED Final   Pseudomonas aeruginosa NOT DETECTED NOT DETECTED Final   Candida albicans NOT DETECTED NOT DETECTED Final   Candida glabrata NOT DETECTED NOT DETECTED Final   Candida krusei NOT DETECTED NOT DETECTED Final   Candida parapsilosis NOT DETECTED NOT DETECTED Final   Candida tropicalis NOT DETECTED NOT DETECTED Final  Blood culture (routine x 2)     Status: None (Preliminary result)   Collection Time: 04/05/17  4:00 PM  Result Value Ref Range Status   Specimen Description BLOOD RIGHT ANTECUBITAL  Final   Special Requests IN PEDIATRIC BOTTLE Blood Culture adequate volume  Final   Culture NO GROWTH 4 DAYS  Final   Report Status PENDING  Incomplete  MRSA PCR Screening     Status: None   Collection Time: 04/05/17  9:02 PM  Result Value Ref Range Status   MRSA by PCR NEGATIVE NEGATIVE Final    Comment:        The GeneXpert MRSA Assay (FDA approved for NASAL specimens only), is one component of a comprehensive MRSA colonization surveillance program. It is not intended to diagnose MRSA infection nor to guide or monitor treatment for MRSA  infections.   Culture, sputum-assessment     Status: None   Collection Time: 04/05/17 11:52 PM  Result Value Ref Range Status   Specimen Description EXPECTORATED SPUTUM  Final   Special Requests Normal  Final   Sputum evaluation THIS SPECIMEN IS ACCEPTABLE FOR SPUTUM CULTURE  Final   Report Status 04/06/2017 FINAL  Final  Culture, respiratory (NON-Expectorated)     Status: None   Collection Time: 04/05/17 11:52 PM  Result Value Ref Range Status   Specimen Description EXPECTORATED SPUTUM  Final   Special Requests Normal Reflexed from [id number]  Final   Gram Stain   Final    MODERATE WBC PRESENT, PREDOMINANTLY PMN FEW GRAM POSITIVE COCCI RARE GRAM NEGATIVE RODS RARE GRAM POSITIVE RODS RARE GRAM NEGATIVE COCCOBACILLI    Culture Consistent with normal respiratory flora.  Final   Report Status 04/08/2017 FINAL  Final     Labs: BNP (last 3 results)  Recent Labs  09/18/16 0231 12/29/16 0321 04/05/17 1615  BNP 77.4 121.8* 045.4*   Basic Metabolic Panel:  Recent Labs Lab 04/05/17 1426 04/05/17 2347 04/06/17 0420 04/07/17 0439 04/08/17 0342 04/09/17 0740  NA 137  --  140 139 140 141  K 4.5  --  3.9 4.1 3.9 3.7  CL 97*  --  99* 96* 97* 100*  CO2 30  --  30 33* 34* 31  GLUCOSE 231*  --  242* 251* 197* 188*  BUN 14  --  18 23* 27* 25*  CREATININE 0.75 0.82 0.72 0.82 0.81 0.82  CALCIUM 8.8*  --  8.6* 9.0 9.0 8.8*   Liver Function Tests:  Recent Labs Lab 04/05/17 1426 04/08/17 0342 04/09/17 0740  AST 20 15 24   ALT 16 16 17   ALKPHOS 81 69 66  BILITOT 1.5* 0.6 1.1  PROT 6.5 6.2* 6.0*  ALBUMIN 3.3* 2.9* 2.8*   No results for input(s): LIPASE, AMYLASE in the last 168 hours. No results for input(s): AMMONIA in the last 168 hours. CBC:  Recent Labs Lab 04/05/17 1615 04/06/17 0420 04/08/17 0342 04/09/17 0740  WBC 12.4* 6.4 10.7* 12.4*  NEUTROABS 11.0*  --   --   --   HGB 12.6 11.9* 12.6 13.2  HCT 40.4 38.6 40.3 43.4  MCV 100.2* 100.3* 99.3 100.5*  PLT 239 252  325 303   Cardiac Enzymes: No results for input(s): CKTOTAL, CKMB, CKMBINDEX, TROPONINI in the last 168 hours. BNP: Invalid input(s): POCBNP CBG:  Recent Labs Lab 04/09/17 0754 04/09/17 1136 04/09/17 1635 04/09/17 2113 04/10/17 0741  GLUCAP 171* 206* 243* 283* 159*   D-Dimer No results for input(s): DDIMER in the last 72 hours. Hgb A1c No results for input(s): HGBA1C in the last 72 hours. Lipid Profile No results for input(s): CHOL, HDL, LDLCALC, TRIG, CHOLHDL, LDLDIRECT in the last 72 hours. Thyroid function studies No results for input(s): TSH, T4TOTAL, T3FREE, THYROIDAB in the last 72 hours.  Invalid input(s): FREET3 Anemia work up  Recent Labs  04/09/17 1431  VITAMINB12 310   Urinalysis    Component Value Date/Time   COLORURINE AMBER (A) 04/05/2017 0002   APPEARANCEUR HAZY (A) 04/05/2017 0002   LABSPEC 1.032 (H) 04/05/2017 0002   PHURINE 5.0 04/05/2017 0002   GLUCOSEU 50 (A) 04/05/2017 0002   HGBUR NEGATIVE 04/05/2017 0002   BILIRUBINUR NEGATIVE 04/05/2017 0002   KETONESUR 80 (A) 04/05/2017 0002   PROTEINUR 30 (A) 04/05/2017 0002   UROBILINOGEN 1.0 10/24/2014 2147   NITRITE NEGATIVE 04/05/2017 0002   LEUKOCYTESUR NEGATIVE 04/05/2017 0002   Sepsis Labs Invalid input(s): PROCALCITONIN,  WBC,  LACTICIDVEN Microbiology Recent Results (from the past  240 hour(s))  Urine culture     Status: Abnormal   Collection Time: 04/05/17 12:08 AM  Result Value Ref Range Status   Specimen Description URINE, RANDOM  Final   Special Requests NONE  Final   Culture MULTIPLE SPECIES PRESENT, SUGGEST RECOLLECTION (A)  Final   Report Status 04/07/2017 FINAL  Final  Blood culture (routine x 2)     Status: Abnormal   Collection Time: 04/05/17  2:38 PM  Result Value Ref Range Status   Specimen Description BLOOD BLOOD LEFT FOREARM  Final   Special Requests   Final    BOTTLES DRAWN AEROBIC AND ANAEROBIC Blood Culture adequate volume   Culture  Setup Time   Final    GRAM  POSITIVE COCCI IN CLUSTERS IN BOTH AEROBIC AND ANAEROBIC BOTTLES CRITICAL RESULT CALLED TO, READ BACK BY AND VERIFIED WITH: PHARMD B. JOINOM [id number] 04/06/2017 T. TYSOR    Culture (A)  Final    STAPHYLOCOCCUS SPECIES (COAGULASE NEGATIVE) THE SIGNIFICANCE OF ISOLATING THIS ORGANISM FROM A SINGLE SET OF BLOOD CULTURES WHEN MULTIPLE SETS ARE DRAWN IS UNCERTAIN. PLEASE NOTIFY THE MICROBIOLOGY DEPARTMENT WITHIN ONE WEEK IF SPECIATION AND SENSITIVITIES ARE REQUIRED.    Report Status 04/08/2017 FINAL  Final  Blood Culture ID Panel (Reflexed)     Status: Abnormal   Collection Time: 04/05/17  2:38 PM  Result Value Ref Range Status   Enterococcus species NOT DETECTED NOT DETECTED Final   Listeria monocytogenes NOT DETECTED NOT DETECTED Final   Staphylococcus species DETECTED (A) NOT DETECTED Final    Comment: Methicillin (oxacillin) resistant coagulase negative staphylococcus. Possible blood culture contaminant (unless isolated from more than one blood culture draw or clinical case suggests pathogenicity). No antibiotic treatment is indicated for blood  culture contaminants. CRITICAL RESULT CALLED TO, READ BACK BY AND VERIFIED WITH: [place] 0947 04/06/2017 T.TYSOR    Staphylococcus aureus NOT DETECTED NOT DETECTED Final   Methicillin resistance DETECTED (A) NOT DETECTED Final    Comment: CRITICAL RESULT CALLED TO, READ BACK BY AND VERIFIED WITH: PHARMD B. MARTIN 0962 04/06/2017 T.TYSOR    Streptococcus species NOT DETECTED NOT DETECTED Final   Streptococcus agalactiae NOT DETECTED NOT DETECTED Final   Streptococcus pneumoniae NOT DETECTED NOT DETECTED Final   Streptococcus pyogenes NOT DETECTED NOT DETECTED Final   Acinetobacter baumannii NOT DETECTED NOT DETECTED Final   Enterobacteriaceae species NOT DETECTED NOT DETECTED Final   Enterobacter cloacae complex NOT DETECTED NOT DETECTED Final   Escherichia coli NOT DETECTED NOT DETECTED Final   Klebsiella oxytoca NOT DETECTED NOT DETECTED  Final   Klebsiella pneumoniae NOT DETECTED NOT DETECTED Final   Proteus species NOT DETECTED NOT DETECTED Final   Serratia marcescens NOT DETECTED NOT DETECTED Final   Haemophilus influenzae NOT DETECTED NOT DETECTED Final   Neisseria meningitidis NOT DETECTED NOT DETECTED Final   Pseudomonas aeruginosa NOT DETECTED NOT DETECTED Final   Candida albicans NOT DETECTED NOT DETECTED Final   Candida glabrata NOT DETECTED NOT DETECTED Final   Candida krusei NOT DETECTED NOT DETECTED Final   Candida parapsilosis NOT DETECTED NOT DETECTED Final   Candida tropicalis NOT DETECTED NOT DETECTED Final  Blood culture (routine x 2)     Status: None (Preliminary result)   Collection Time: 04/05/17  4:00 PM  Result Value Ref Range Status   Specimen Description BLOOD RIGHT ANTECUBITAL  Final   Special Requests IN PEDIATRIC BOTTLE Blood Culture adequate volume  Final   Culture NO GROWTH 4 DAYS  Final  Report Status PENDING  Incomplete  MRSA PCR Screening     Status: None   Collection Time: 04/05/17  9:02 PM  Result Value Ref Range Status   MRSA by PCR NEGATIVE NEGATIVE Final    Comment:        The GeneXpert MRSA Assay (FDA approved for NASAL specimens only), is one component of a comprehensive MRSA colonization surveillance program. It is not intended to diagnose MRSA infection nor to guide or monitor treatment for MRSA infections.   Culture, sputum-assessment     Status: None   Collection Time: 04/05/17 11:52 PM  Result Value Ref Range Status   Specimen Description EXPECTORATED SPUTUM  Final   Special Requests Normal  Final   Sputum evaluation THIS SPECIMEN IS ACCEPTABLE FOR SPUTUM CULTURE  Final   Report Status 04/06/2017 FINAL  Final  Culture, respiratory (NON-Expectorated)     Status: None   Collection Time: 04/05/17 11:52 PM  Result Value Ref Range Status   Specimen Description EXPECTORATED SPUTUM  Final   Special Requests Normal Reflexed from [id number]  Final   Gram Stain   Final     MODERATE WBC PRESENT, PREDOMINANTLY PMN FEW GRAM POSITIVE COCCI RARE GRAM NEGATIVE RODS RARE GRAM POSITIVE RODS RARE GRAM NEGATIVE COCCOBACILLI    Culture Consistent with normal respiratory flora.  Final   Report Status 04/08/2017 FINAL  Final     Time coordinating discharge: Over 30 minutes  SIGNED:   Louellen Molder, MD  [facility] 04/10/2017, 10:25 AM Pager   If 7PM-7AM, please contact night-coverage [email or web address] Password [email or web address]

## 2017-04-10 NOTE — Care Management Important Message (Signed)
Important Message  Patient Details  Name: Dawn Montoya MRN: [id number] Date of Birth: 03-09-1932   Medicare Important Message Given:  Yes    [name] 04/10/2017, 1:53 PM

## 2017-04-10 NOTE — Progress Notes (Signed)
PT Cancellation Note  Patient Details Name: Dawn Montoya MRN: [id number] DOB: August 29, 1932   Cancelled Treatment:    Reason Eval/Treat Not Completed: Other (comment).  Declined treatment including bed exercises.  May try later as time and pt allow.   Ramond Dial 04/10/2017, 10:39 AM   Mee Hives, PT MS Acute Rehab Dept. Number: [place] and [place]

## 2017-04-10 NOTE — Clinical Social Work Note (Signed)
CSW facilitated patient discharge including contacting facility to confirm patient discharge plans. Patient declined to have CSW call family, stating that her daughter had passed away. Clinical information faxed to facility and family agreeable with plan. CSW arranged ambulance transport via [facility] to [place]. RN to call report prior to discharge [phone]).  CSW will sign off for now as social work intervention is no longer needed. Please consult [place] again if new needs arise.  Dayton Scrape, [place]

## 2017-04-12 ENCOUNTER — Encounter: Payer: Self-pay | Admitting: Internal Medicine

## 2017-04-12 ENCOUNTER — Encounter: Payer: Self-pay | Admitting: Pulmonary Disease

## 2017-04-12 ENCOUNTER — Non-Acute Institutional Stay (SKILLED_NURSING_FACILITY): Payer: Medicare Other | Admitting: Internal Medicine

## 2017-04-12 ENCOUNTER — Other Ambulatory Visit (INDEPENDENT_AMBULATORY_CARE_PROVIDER_SITE_OTHER): Payer: Medicare Other

## 2017-04-12 ENCOUNTER — Ambulatory Visit (INDEPENDENT_AMBULATORY_CARE_PROVIDER_SITE_OTHER): Payer: Medicare Other | Admitting: Pulmonary Disease

## 2017-04-12 ENCOUNTER — Ambulatory Visit (INDEPENDENT_AMBULATORY_CARE_PROVIDER_SITE_OTHER)
Admission: RE | Admit: 2017-04-12 | Discharge: 2017-04-12 | Disposition: A | Discharge status: Home / Self Care | Payer: Medicare Other | Source: Ambulatory Visit | Attending: Pulmonary Disease | Admitting: Pulmonary Disease

## 2017-04-12 VITALS — BP 142/84 | HR 94 | Ht 63.5 in | Wt 247.0 lb

## 2017-04-12 DIAGNOSIS — J441 Chronic obstructive pulmonary disease with (acute) exacerbation: Secondary | ICD-10-CM

## 2017-04-12 DIAGNOSIS — IMO0002 Reserved for concepts with insufficient information to code with codable children: Secondary | ICD-10-CM

## 2017-04-12 DIAGNOSIS — R0600 Dyspnea, unspecified: Secondary | ICD-10-CM

## 2017-04-12 DIAGNOSIS — F339 Major depressive disorder, recurrent, unspecified: Secondary | ICD-10-CM

## 2017-04-12 DIAGNOSIS — K0889 Other specified disorders of teeth and supporting structures: Secondary | ICD-10-CM | POA: Diagnosis not present

## 2017-04-12 DIAGNOSIS — J9611 Chronic respiratory failure with hypoxia: Secondary | ICD-10-CM

## 2017-04-12 DIAGNOSIS — I5032 Chronic diastolic (congestive) heart failure: Secondary | ICD-10-CM

## 2017-04-12 DIAGNOSIS — I509 Heart failure, unspecified: Secondary | ICD-10-CM

## 2017-04-12 DIAGNOSIS — J9602 Acute respiratory failure with hypercapnia: Secondary | ICD-10-CM | POA: Diagnosis not present

## 2017-04-12 DIAGNOSIS — J9601 Acute respiratory failure with hypoxia: Secondary | ICD-10-CM | POA: Diagnosis not present

## 2017-04-12 DIAGNOSIS — Z972 Presence of dental prosthetic device (complete) (partial): Secondary | ICD-10-CM

## 2017-04-12 DIAGNOSIS — E1351 Other specified diabetes mellitus with diabetic peripheral angiopathy without gangrene: Secondary | ICD-10-CM | POA: Diagnosis not present

## 2017-04-12 DIAGNOSIS — E1365 Other specified diabetes mellitus with hyperglycemia: Secondary | ICD-10-CM

## 2017-04-12 HISTORY — DX: Reserved for concepts with insufficient information to code with codable children: IMO0002

## 2017-04-12 HISTORY — DX: Other specified diabetes mellitus with diabetic peripheral angiopathy without gangrene: E13.51

## 2017-04-12 LAB — BRAIN NATRIURETIC PEPTIDE: PRO B NATRI PEPTIDE: 153 pg/mL — AB (ref 0.0–100.0)

## 2017-04-12 LAB — BASIC METABOLIC PANEL
BUN: 26 mg/dL — ABNORMAL HIGH (ref 6–23)
CALCIUM: 10.1 mg/dL (ref 8.4–10.5)
CO2: 42 mEq/L — ABNORMAL HIGH (ref 19–32)
CREATININE: 0.82 mg/dL (ref 0.40–1.20)
Chloride: 95 mEq/L — ABNORMAL LOW (ref 96–112)
GFR: 70.7 mL/min (ref >60.00)
Glucose, Bld: 271 mg/dL — ABNORMAL HIGH (ref 70–99)
Potassium: 5 mEq/L (ref 3.5–5.1)
SODIUM: 145 meq/L (ref 135–145)

## 2017-04-12 NOTE — Assessment & Plan Note (Signed)
Continue pulmonary toilet Steroid taper Complete course of Augmentin

## 2017-04-12 NOTE — Assessment & Plan Note (Signed)
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

## 2017-04-12 NOTE — Patient Instructions (Signed)
See assessment and plan under each diagnosis in the problem list and acutely for this visit 

## 2017-04-12 NOTE — Patient Instructions (Signed)
For your chronic systolic heart failure: Increase furosemide to twice daily for the next 3 days We will check a pro BNP test today We will check a chest x-ray today We will check a basic metabolic panel today  For your COPD: I'm going to have the nursing home daily Spiriva once daily I will increase budesonide nebulized 0.5 mg twice a day Keep taking Brovana twice a day Keep using albuterol as needed for shortness of breath  For your chronic respiratory failure with hypoxemia Use 3 L of oxygen continuously  I will see you back in 3-4 weeks with a nurse practitioner or sooner if needed

## 2017-04-12 NOTE — Assessment & Plan Note (Signed)
Continue generic Cymbalta Psych nurse practitioner follow-up

## 2017-04-12 NOTE — Progress Notes (Signed)
Subjective:    Patient ID: Dawn Montoya, female    DOB: [birth year], 83 y.o.   MRN: [id number]  Synopsis: Dawn Montoya has gold grade D COPD and was hospitalized in January 2016 for a COPD exacerbation which was complicated by diastolic heart failure. She was sent to a skilled nursing facility where she continues to participate in rehabilitation on a daily basis.  HPI Chief Complaint  Patient presents with  . Follow-up    pt c/o increased sob X1 day.  denies CP, mucus production, sinus congestion.     Dawn Montoya was hospitalized thorugh 2 days ago.  She is still taking prednisone and levaquin/  Her daughter died from liver and throat cancer 2 weeks ago.    She is getting breathing treatments every morning.  She has some leg swelling. She says that when she takes furosemide she does urinate heavily afterwards.  Her nurse's aide notes that she's been breathing with more difficulty today but the patient denies this. She denies cough or wheeze. She says she is receiving breathing treatments.  Past Medical History:  Diagnosis Date  . Anxiety state 02/25/2015  . Aortic atherosclerosis ([place]) 12/31/2016  . Aortic atherosclerosis ([place]) 12/31/2016  . Breast CA ([place])   . CKD (chronic kidney disease) stage 3, GFR 30-59 ml/min 12/15/2014  . COPD (chronic obstructive pulmonary disease) ([place])   . Diastolic CHF ([place]) 01/30/6758  . DM (diabetes mellitus), secondary, uncontrolled, with peripheral vascular complications ([place]) 1/63/8466  . GERD without esophagitis 01/19/2016  . Hordeolum externum ([place])    07/01/2016  . HTN (hypertension) 01/18/2014  . Hypertension   . Hypothyroidism 01/18/2014  . Pneumonia   . Recurrent major depression ([place]) 10/03/2016  . Sigmoid diverticulosis   . Spondylosis, cervical, with myelopathy 04/11/2016      Review of Systems  Constitutional: Positive for fatigue. Negative for chills and fever.  HENT: Negative for postnasal drip, rhinorrhea and sinus pressure.   Respiratory:  Positive for cough and shortness of breath. Negative for wheezing.   Cardiovascular: Positive for leg swelling. Negative for chest pain and palpitations.       Objective:   Physical Exam  Vitals:   04/12/17 1041  BP: (!) 142/84  Pulse: 94  SpO2: 95%  Weight: 247 lb (112 kg)  Height: 5' 3.5" (1.613 m)   3L [place] continuously  Gen: chronically ill appearing in wheelchair HENT: OP clear, TM's clear, neck supple PULM: Crackles bases, no wheezing, normal percussion CV: RRR, no mgr, notable leg edema GI: BS+, soft, nontender Derm: chronic venous stasis changes legs Psyche: normal mood and affect    Records from her June 2018 hospitalization reviewed were she had healthcare associated pneumonia and a COPD exacerbation. She required treatment with BiPAP, prednisone, Levaquin.    CBC    Component Value Date/Time   WBC 12.4 (H) 04/09/2017 0740   RBC 4.32 04/09/2017 0740   HGB 13.2 04/09/2017 0740   HCT 43.4 04/09/2017 0740   PLT 303 04/09/2017 0740   MCV 100.5 (H) 04/09/2017 0740   MCH 30.6 04/09/2017 0740   MCHC 30.4 04/09/2017 0740   RDW 14.9 04/09/2017 0740   LYMPHSABS 0.7 04/05/2017 1615   MONOABS 0.7 04/05/2017 1615   EOSABS 0.0 04/05/2017 1615   BASOSABS 0.0 04/05/2017 1615       Assessment & Plan:   Dyspnea, unspecified type - Plan: DG Chest 2 View, Brain natriuretic peptide  Congestive heart failure, unspecified HF chronicity, unspecified heart failure type ([place]) - Plan: Basic Metabolic  Panel (BMET)  COPD exacerbation (HCC)  Chronic respiratory failure with hypoxia (HCC)  Diastolic CHF, chronic ([place])   Discussion: Dawn Montoya has sounds as if she is volume overload on physical exam today 2 days after leaving the hospital. She has crackles in the bases of her lungs and she has edema bilaterally. Her nurse's aide notes that she's been breathing more difficulty today.  I believe that she's got a mild exacerbation of her chronic diastolic heart failure. She  does not sound as if she is wheezing today. I believe that her COPD exacerbation is improving with steroids and antibiotics. However, considering her chronic COPD she needs to be back on Spiriva daily and she needs to have her dose of budesonide increased. She is quite frail and her risk of rehospitalization is high. We may be at a point where he needed consider hospice.  Plan: For your chronic systolic heart failure: Increase furosemide to twice daily for the next 3 days We will check a pro BNP test today We will check a chest x-ray today We will check a basic metabolic panel today  For your COPD: I'm going to have the nursing home daily Spiriva once daily I will increase budesonide nebulized 0.5 mg twice a day Keep taking Brovana twice a day Keep using albuterol as needed for shortness of breath  For your chronic respiratory failure with hypoxemia Use 3 L of oxygen continuously  I will see you back in 3-4 weeks with a nurse practitioner or sooner if needed  Updated Medication List Outpatient Encounter Prescriptions as of 04/12/2017  Medication Sig  . acetaminophen (TYLENOL) 500 MG tablet Take 500 mg by mouth every 6 (six) hours as needed (FOR PAIN).   Marland Kitchen albuterol (PROVENTIL HFA;VENTOLIN HFA) 108 (90 Base) MCG/ACT inhaler Inhale 2 puffs into the lungs every 6 (six) hours as needed for wheezing or shortness of breath.  Marland Kitchen amoxicillin-clavulanate (AUGMENTIN) 875-125 MG tablet Take 1 tablet by mouth every 12 (twelve) hours.  Marland Kitchen arformoterol (BROVANA) 15 MCG/2ML NEBU Take 2 mLs (15 mcg total) by nebulization 2 (two) times daily.  . ARTIFICIAL TEAR SOLUTION OP Place 1 drop into both eyes 4 (four) times daily as needed (for dry eyes).  Marland Kitchen aspirin 81 MG chewable tablet Chew 1 tablet (81 mg total) by mouth daily.  . benzocaine (ORAJEL) 10 % mucosal gel Use as directed 1 application in the mouth or throat 3 (three) times daily before meals. FOR GUM PAIN  . benzonatate (TESSALON) 100 MG capsule Take  1 capsule (100 mg total) by mouth 3 (three) times daily.  . bisacodyl (DULCOLAX) 10 MG suppository Place 10 mg rectally once as needed for mild constipation.   . budesonide (PULMICORT) 0.25 MG/2ML nebulizer solution Take 0.25 mg by nebulization 2 (two) times daily.  . carvedilol (COREG) 3.125 MG tablet Take 3.125 mg by mouth 2 (two) times daily with a meal. Hold for HR less than 60 and BP less than 100.  . cholecalciferol (VITAMIN D) 1000 UNITS tablet Take 1,000 Units by mouth daily. Reported on 04/11/2016  . cycloSPORINE (RESTASIS) 0.05 % ophthalmic emulsion Place 1 drop into both eyes 2 (two) times daily.  Marland Kitchen Dextromethorphan-Guaifenesin (MUCINEX DM MAXIMUM STRENGTH) 60-1200 MG TB12 Take 1 tablet by mouth 2 (two) times daily. Reported on 04/11/2016  . DULoxetine (CYMBALTA) 60 MG capsule Take 1 capsule (60 mg total) by mouth daily.  . furosemide (LASIX) 40 MG tablet Take 40 mg by mouth daily. FOR CHF  . gabapentin (NEURONTIN)  300 MG capsule Take 300 mg by mouth 3 (three) times daily.   Marland Kitchen ipratropium-albuterol (DUONEB) 0.5-2.5 (3) MG/3ML SOLN Take 3 mLs by nebulization every 6 (six) hours. FOR COPD  . levothyroxine (SYNTHROID, LEVOTHROID) 75 MCG tablet Take 75 mcg by mouth daily before breakfast.  . magnesium hydroxide (MILK OF MAGNESIA) 400 MG/5ML suspension Take 30 mLs by mouth once as needed for mild constipation.   . Melatonin 3 MG TABS Take 3 mg by mouth at bedtime.   . nitroGLYCERIN (NITROSTAT) 0.4 MG SL tablet Place 0.4 mg under the tongue every 5 (five) minutes as needed for chest pain. CALL MD IF NO RELIEF AFTER 3 DOSES  . OXYGEN Inhale 3 L into the lungs continuous. FOR HYPOXIA  . pantoprazole (PROTONIX) 40 MG tablet Take 40 mg by mouth daily.   . polyethylene glycol (MIRALAX / GLYCOLAX) packet Take 17 g by mouth daily.  . predniSONE (DELTASONE) 20 MG tablet Take 20 mg by mouth daily with breakfast. Take 2 tablets to = 40 mg qd x3 days, then take 20 mg qd x 3 days, then take 10 mg qd x3  days, then discontinue.  Marland Kitchen Propylene Glycol (SYSTANE BALANCE) 0.6 % SOLN Place 1 drop into both eyes 2 (two) times daily.  . simethicone (MYLICON) 80 MG chewable tablet Chew 1 tablet (80 mg total) by mouth 4 (four) times daily as needed for flatulence.  . Sodium Phosphates (RA SALINE ENEMA) 19-7 GM/118ML ENEM Place 1 each rectally once as needed (for constipation). AND CALL PHYSICIAN IF NO RELIEF FROM THIS  . traMADol (ULTRAM) 50 MG tablet Take 1 tablet (50 mg total) by mouth every 8 (eight) hours as needed for severe pain.  . valsartan (DIOVAN) 40 MG tablet Take 20 mg by mouth daily. Take 1/2 tablet to = 20 mg  . vitamin B-12 1000 MCG tablet Take 1 tablet (1,000 mcg total) by mouth daily.   No facility-administered encounter medications on file as of 04/12/2017.

## 2017-04-12 NOTE — Progress Notes (Signed)
NURSING HOME LOCATION:  [facility] ROOM NUMBER:  [id number]  CODE STATUS:  DNR  PCP:  Hendricks Limes, MD  [address] [place] [address]  This is a nursing facility follow up for [place] [facility] readmission within 30 days  Interim medical record and care since last [place] visit was updated with review of diagnostic studies and change in clinical status since last visit were documented.  HPI: The patient was hospitalized 6/13-6/18/18 for acute respiratory failure with hypoxia and hypercapnia in the context of healthcare associated pneumonia. This was complicated by acute metabolic encephalopathy and clinical sepsis. Sepsis was in the context of COPD exacerbation necessitating BiPAP on admission overnight .She was transitioned to her maintenance 3 L/m nasal oxygen. She received parenteral steroids initially, but she was transitioned to oral prednisone with a taper over 12 days. Aggressive pulmonary toilet with Pulmicort and DuoNeb were to be continued. Parenteral antibiotics were changed to Augmentin. Acute metabolic encephalopathy was attributed to the acute sepsis. Tramadol, Neurontin, and Cymbalta were held until reinitiation at discharge.  Significant history is that the patient's daughter died approximately 2 weeks prior to admission and family is concerned the patient is in denial. B12 was low-normal at 310. Glucoses ranged 188-251, last A1c was 8.7% on 5/25. Her albumin was reduced to 2.8 and total protein to 6.  Review of systems:  Her major complaint is continued pain in the right mandible which is worse if she wears her lower plate when she eats. This has impacted her appetite. Even ingesting soft food is associated with discomfort. She states that her breathing is significantly better.  She describes intermittent abdominal discomfort without trigger or relievers. This typically can last up to 10 minutes.  She also has bilateral leg weakness. She has occasional  occipital pain and also intermittent dizziness occasionally associated with vertigo. Unrelated to the occipital pain is radicular discomfort in the left upper extremity for which she is receiving PT/OT.   Constitutional: No fever,significant weight change  Eyes: No redness, discharge, pain, vision change ENT/mouth: No nasal congestion,  purulent discharge, earache,change in hearing ,sore throat  Cardiovascular: No chest pain, palpitations,paroxysmal nocturnal dyspnea, claudication, edema  Respiratory: No cough, sputum production,hemoptysis Gastrointestinal: No heartburn,dysphagia,nausea / vomiting,rectal bleeding, melena,change in bowels Genitourinary: No dysuria,hematuria, pyuria,  incontinence, nocturia Musculoskeletal: No joint stiffness, joint swelling, pain Dermatologic: No rash, pruritus, change in appearance of skin Neurologic: No syncope, seizures Endocrine: No change in hair/skin/ nails, excessive thirst, excessive hunger, excessive urination  Hematologic/lymphatic: No significant bruising, lymphadenopathy,abnormal bleeding Allergy/immunology: No itchy/ watery eyes, significant sneezing, urticaria, angioedema  Physical exam:  Pertinent or positive findings: her voice is raspy and squeaky. She is on nasal oxygen. She is wearing only the upper plate. There is no visible mandibular lesion except for a possible small mucocele or osteoma.  Chest is barrel-shaped and breath sounds are decreased. There is respiratory variation to the heart rhythm. Abdomen is protuberant.   She is generally weak, especially the left upper extremity. She describes pain in the left axilla with elevation of the left upper extremity. There is decreased elevation in the left upper extremity. Clubbing of the nailbeds is noted. She has 1/2+ pedal edema of the right lower extremity only.  General appearance:Adequately nourished; no acute distress , increased work of breathing is present.   Lymphatic: No lymphadenopathy  about the head, neck, axilla . Eyes: No conjunctival inflammation or lid edema is present. There is no scleral icterus. Ears:  External  ear exam shows no significant lesions or deformities.   Nose:  External nasal examination shows no deformity or inflammation. Nasal mucosa are pink and moist without lesions ,exudates Oral exam: lips and gums are healthy appearing.There is no oropharyngeal erythema or exudate . Neck:  No thyromegaly, masses, tenderness noted.    Heart:  No gallop, murmur, click, rub .  Lungs: without wheezes, rhonchi,rales , rubs. Abdomen:Bowel sounds are normal. Abdomen is soft and nontender with no organomegaly, hernias,masses. GU: deferred  Extremities:  No cyanosis Skin: Warm & dry w/o tenting. No significant lesions or rash.  See summary under each active problem in the Problem List with associated updated therapeutic plan

## 2017-04-12 NOTE — Assessment & Plan Note (Signed)
Dental care follow-up at next scheduled SNF visit PRN Orajel

## 2017-05-03 ENCOUNTER — Non-Acute Institutional Stay (SKILLED_NURSING_FACILITY): Payer: Medicare Other | Admitting: Adult Health

## 2017-05-03 ENCOUNTER — Encounter: Payer: Self-pay | Admitting: Adult Health

## 2017-05-03 DIAGNOSIS — K219 Gastro-esophageal reflux disease without esophagitis: Secondary | ICD-10-CM

## 2017-05-03 DIAGNOSIS — J189 Pneumonia, unspecified organism: Secondary | ICD-10-CM | POA: Diagnosis not present

## 2017-05-03 DIAGNOSIS — J9611 Chronic respiratory failure with hypoxia: Secondary | ICD-10-CM

## 2017-05-03 DIAGNOSIS — E1365 Other specified diabetes mellitus with hyperglycemia: Secondary | ICD-10-CM

## 2017-05-03 DIAGNOSIS — G4700 Insomnia, unspecified: Secondary | ICD-10-CM | POA: Diagnosis not present

## 2017-05-03 DIAGNOSIS — E1351 Other specified diabetes mellitus with diabetic peripheral angiopathy without gangrene: Secondary | ICD-10-CM | POA: Diagnosis not present

## 2017-05-03 DIAGNOSIS — F339 Major depressive disorder, recurrent, unspecified: Secondary | ICD-10-CM

## 2017-05-03 DIAGNOSIS — I700 Atherosclerosis of aorta: Secondary | ICD-10-CM

## 2017-05-03 DIAGNOSIS — J449 Chronic obstructive pulmonary disease, unspecified: Secondary | ICD-10-CM | POA: Diagnosis not present

## 2017-05-03 DIAGNOSIS — IMO0002 Reserved for concepts with insufficient information to code with codable children: Secondary | ICD-10-CM

## 2017-05-03 DIAGNOSIS — I10 Essential (primary) hypertension: Secondary | ICD-10-CM

## 2017-05-03 DIAGNOSIS — E034 Atrophy of thyroid (acquired): Secondary | ICD-10-CM | POA: Diagnosis not present

## 2017-05-03 DIAGNOSIS — I5032 Chronic diastolic (congestive) heart failure: Secondary | ICD-10-CM | POA: Diagnosis not present

## 2017-05-03 NOTE — Progress Notes (Signed)
DATE:  05/03/2017   MRN:  [id number]  BIRTHDAY: 17-Sep-1932  Facility:  Nursing Home Location:  [facility] Room Number: 227-B  LEVEL OF CARE:  SNF (31)  Contact Information    Name Relation Home Work [place]  [phone]  [phone]       Code Status History    Date Active Date Inactive Code Status Order ID Comments User Context   04/05/2017  8:59 PM 04/10/2017  3:34 PM DNR [id number]  Louellen Molder, MD Inpatient   12/29/2016 12:47 AM 01/01/2017  4:25 PM DNR [id number]  Etta Quill, DO ED   09/18/2016  5:43 AM 09/20/2016  8:43 PM DNR [id number]  Oswald Hillock, MD Inpatient   08/26/2016  3:06 PM 09/18/2016  2:20 AM DNR [id number]  Lauree Chandler, NP Outpatient   10/25/2014  1:38 AM 10/31/2014 10:19 PM Full Code [id number]  Rise Patience, MD Inpatient   02/22/2014  4:18 PM 02/25/2014  9:53 PM Full Code [id number]  Louellen Molder, MD Inpatient   01/18/2014  8:27 PM 01/27/2014 10:38 PM Full Code [id number]  Eugenie Filler, MD Inpatient    Questions for Most Recent Historical Code Status (Order [id number])    Question Answer Comment   In the event of cardiac or respiratory ARREST Do not call a "code blue"    In the event of cardiac or respiratory ARREST Do not perform Intubation, CPR, defibrillation or ACLS    In the event of cardiac or respiratory ARREST Use medication by any route, position, wound care, and other measures to relive pain and suffering. May use oxygen, suction and manual treatment of airway obstruction as needed for comfort.         Advance Directive Documentation     Most Recent Value  Type of Advance Directive  Out of facility DNR (pink MOST or yellow form)  Pre-existing out of facility DNR order (yellow form or pink MOST form)  -  "MOST" Form in Place?  -       Chief Complaint  Patient presents with  . Medical Management of Chronic Issues    Routine visit    HISTORY OF PRESENT ILLNESS:  This is an 18-YO  female seen for a routine visit.  She is a long-term care resident of [facility]. She was seen today and was sweating verbalizing that she feels "OK." She was earlier outside the patio under the bright sun. Noted to have slight wheezing on the right lung base. No reported fever. Chest x-ray was done and showed infiltrate right lung base. She has PMH of chronic hypercapnic respiratory failure secondary to COPD (on O2 at 3 L/minute via [place] continuously), chronic diastolic CHF, peripheral neuropathy, hypertension and depression.    PAST MEDICAL HISTORY:  Past Medical History:  Diagnosis Date  . Anxiety state 02/25/2015  . Aortic atherosclerosis ([place]) 12/31/2016  . Aortic atherosclerosis ([place]) 12/31/2016  . Breast CA ([place])   . CKD (chronic kidney disease) stage 3, GFR 30-59 ml/min 12/15/2014  . COPD (chronic obstructive pulmonary disease) ([place])   . Diastolic CHF ([place]) 03/25/2632  . DM (diabetes mellitus), secondary, uncontrolled, with peripheral vascular complications ([place]) 3/54/5625  . GERD without esophagitis 01/19/2016  . Hordeolum externum (stye)    07/01/2016  . HTN (hypertension) 01/18/2014  . Hypertension   . Hypothyroidism 01/18/2014  . Pneumonia   . Recurrent major depression ([place]) 10/03/2016  . Sigmoid diverticulosis   . Spondylosis,  cervical, with myelopathy 04/11/2016     CURRENT MEDICATIONS: Reviewed  Patient's Medications  New Prescriptions   No medications on file  Previous Medications   ACETAMINOPHEN (TYLENOL) 500 MG TABLET    Take 500 mg by mouth every 6 (six) hours as needed (FOR PAIN).    ALBUTEROL (PROVENTIL HFA;VENTOLIN HFA) 108 (90 BASE) MCG/ACT INHALER    Inhale 2 puffs into the lungs every 6 (six) hours as needed for wheezing or shortness of breath.   ARFORMOTEROL (BROVANA) 15 MCG/2ML NEBU    Take 2 mLs (15 mcg total) by nebulization 2 (two) times daily.   ARTIFICIAL TEAR SOLUTION OP    Place 1 drop into both eyes 4 (four) times daily as needed (for  dry eyes).   ASPIRIN 81 MG CHEWABLE TABLET    Chew 1 tablet (81 mg total) by mouth daily.   BENZOCAINE (ORAJEL) 10 % MUCOSAL GEL    Use as directed 1 application in the mouth or throat 3 (three) times daily before meals.   BENZONATATE (TESSALON) 100 MG CAPSULE    Take 1 capsule (100 mg total) by mouth 3 (three) times daily.   BISACODYL (DULCOLAX) 10 MG SUPPOSITORY    Place 10 mg rectally once as needed for mild constipation.    BUDESONIDE (PULMICORT) 0.25 MG/2ML NEBULIZER SOLUTION    Take 0.25 mg by nebulization 2 (two) times daily.   CARVEDILOL (COREG) 3.125 MG TABLET    Take 3.125 mg by mouth 2 (two) times daily with a meal. Hold for HR less than 60 and BP less than 100.   CHOLECALCIFEROL (VITAMIN D) 1000 UNITS TABLET    Take 1,000 Units by mouth daily. Reported on 04/11/2016   CYCLOSPORINE (RESTASIS) 0.05 % OPHTHALMIC EMULSION    Place 1 drop into both eyes 2 (two) times daily.   DEXTROMETHORPHAN-GUAIFENESIN (MUCINEX DM MAXIMUM STRENGTH) 60-1200 MG TB12    Take 1 tablet by mouth 2 (two) times daily. Reported on 04/11/2016   DULOXETINE (CYMBALTA) 60 MG CAPSULE    Take 1 capsule (60 mg total) by mouth daily.   FUROSEMIDE (LASIX) 40 MG TABLET    Take 40 mg by mouth daily. FOR CHF   GABAPENTIN (NEURONTIN) 300 MG CAPSULE    Take 300 mg by mouth 3 (three) times daily.    INSULIN GLARGINE (LANTUS) 100 UNIT/ML INJECTION    Inject 12 Units into the skin at bedtime.   IPRATROPIUM-ALBUTEROL (DUONEB) 0.5-2.5 (3) MG/3ML SOLN    Take 3 mLs by nebulization every 6 (six) hours. FOR COPD   LEVOTHYROXINE (SYNTHROID, LEVOTHROID) 75 MCG TABLET    Take 75 mcg by mouth daily before breakfast.    LOPERAMIDE (IMODIUM) 2 MG CAPSULE    Take 4 mg by mouth daily as needed for diarrhea or loose stools.   MAGNESIUM HYDROXIDE (MILK OF MAGNESIA) 400 MG/5ML SUSPENSION    Take 30 mLs by mouth once as needed for mild constipation.    MELATONIN 3 MG TABS    Take 3 mg by mouth at bedtime.    METFORMIN (GLUCOPHAGE) 500 MG TABLET     Take 500 mg by mouth 2 (two) times daily with a meal.   NITROGLYCERIN (NITROSTAT) 0.4 MG SL TABLET    Place 0.4 mg under the tongue every 5 (five) minutes as needed for chest pain. CALL MD IF NO RELIEF AFTER 3 DOSES   OXYGEN    Inhale 3 L into the lungs continuous. FOR HYPOXIA   PANTOPRAZOLE (PROTONIX) 40 MG TABLET  Take 40 mg by mouth daily.    POLYETHYLENE GLYCOL (MIRALAX / GLYCOLAX) PACKET    Take 17 g by mouth daily.   PROPYLENE GLYCOL (SYSTANE BALANCE) 0.6 % SOLN    Place 1 drop into both eyes 2 (two) times daily.   SIMETHICONE (MYLICON) 80 MG CHEWABLE TABLET    Chew 1 tablet (80 mg total) by mouth 4 (four) times daily as needed for flatulence.   SODIUM PHOSPHATES (RA SALINE ENEMA) 19-7 GM/118ML ENEM    Place 1 each rectally once as needed (for constipation). AND CALL PHYSICIAN IF NO RELIEF FROM THIS   TRAMADOL (ULTRAM) 50 MG TABLET    Take 1 tablet (50 mg total) by mouth every 8 (eight) hours as needed for severe pain.   VALSARTAN (DIOVAN) 40 MG TABLET    Take 20 mg by mouth daily. Take 1/2 tablet to = 20 mg  Modified Medications   No medications on file  Discontinued Medications   BENZOCAINE (ORAJEL) 10 % MUCOSAL GEL    Use as directed 1 application in the mouth or throat 3 (three) times daily before meals. FOR GUM PAIN   PREDNISONE (DELTASONE) 10 MG TABLET    Take 10 mg by mouth daily with breakfast.   PREDNISONE (DELTASONE) 20 MG TABLET    Take 20 mg by mouth daily with breakfast. Take 2 tablets to = 40 mg qd x3 days, then take 20 mg qd x 3 days, then take 10 mg qd x3 days, then discontinue.   TIOTROPIUM (SPIRIVA) 18 MCG INHALATION CAPSULE    Place 18 mcg into inhaler and inhale daily.   TORSEMIDE (DEMADEX) 20 MG TABLET    Take 40 mg by mouth daily. Take 2 tablets to = 40 mg qd   VITAMIN B-12 1000 MCG TABLET    Take 1 tablet (1,000 mcg total) by mouth daily.     No Known Allergies   REVIEW OF SYSTEMS:  GENERAL: no change in appetite, no fatigue, no weight changes, no fever,  chills or weakness EYES: Denies change in vision, dry eyes, eye pain, itching or discharge EARS: Denies change in hearing, ringing in ears, or earache NOSE: Denies nasal congestion or epistaxis MOUTH and THROAT: Denies oral discomfort, gingival pain or bleeding, pain from teeth or hoarseness   RESPIRATORY: no hemoptysis, +wheezing, and cough CARDIAC: no chest pain, edema or palpitations GI: no abdominal pain, diarrhea, constipation, heart burn, nausea or vomiting GU: Denies dysuria, frequency, hematuria, incontinence, or discharge PSYCHIATRIC: Denies feeling of depression or anxiety. No report of hallucinations, insomnia, paranoia, or agitation     PHYSICAL EXAMINATION  GENERAL APPEARANCE: Well nourished. In no acute distress. Morbidly obese SKIN:  Skin is warm and dry.  HEAD: Normal in size and contour. No evidence of trauma EYES: Lids open and close normally. No blepharitis, entropion or ectropion. PERRL. Conjunctivae are clear and sclerae are white. Lenses are without opacity EARS: Pinnae are normal. Patient hears normal voice tunes of the examiner MOUTH and THROAT: Lips are without lesions. Oral mucosa is moist and without lesions. Tongue is normal in shape, size, and color and without lesions NECK: supple, trachea midline, no neck masses, no thyroid tenderness, no thyromegaly LYMPHATICS: no LAN in the neck, no supraclavicular LAN RESPIRATORY: + slight wheezing on right lung base, on O2@ 3L/min via [employer] continuously CARDIAC: RRR, no murmur,no extra heart sounds, no edema GI: abdomen soft, normal BS, no masses, no tenderness, no hepatomegaly, no splenomegaly EXTREMITIES:  Able to move 4 extremities  PSYCHIATRIC: Alert and oriented X 3. Affect and behavior are appropriate  LABS/RADIOLOGY: Labs reviewed: Basic Metabolic Panel:  Recent Labs  09/19/16 0845 09/20/16 0525  04/08/17 0342 04/09/17 0740 04/12/17 1136  NA  --  139  < > 140 141 145  K  --  3.5  < > 3.9 3.7 5.0  CL   --  93*  < > 97* 100* 95*  CO2  --  34*  < > 34* 31 42*  GLUCOSE  --  97  < > 197* 188* 271*  BUN  --  25*  < > 27* 25* 26*  CREATININE  --  0.98  < > 0.81 0.82 0.82  CALCIUM  --  9.3  < > 9.0 8.8* 10.1  MG 2.2 2.1  --   --   --   --   PHOS 3.8 4.1  --   --   --   --   < > = values in this interval not displayed. Liver Function Tests:  Recent Labs  04/05/17 1426 04/08/17 0342 04/09/17 0740  AST 20 15 24   ALT 16 16 17   ALKPHOS 81 69 66  BILITOT 1.5* 0.6 1.1  PROT 6.5 6.2* 6.0*  ALBUMIN 3.3* 2.9* 2.8*   CBC:  Recent Labs  12/28/16 2250  03/17/17 04/05/17 1615 04/06/17 0420 04/08/17 0342 04/09/17 0740  WBC 9.2  < > 7.6 12.4* 6.4 10.7* 12.4*  NEUTROABS 6.6  --  5 11.0*  --   --   --   HGB 11.3*  < > 12.5 12.6 11.9* 12.6 13.2  HCT 36.9  < > 39 40.4 38.6 40.3 43.4  MCV 98.9  --   --  100.2* 100.3* 99.3 100.5*  PLT 277  < > 204 239 252 325 303  < > = values in this interval not displayed. CBG:  Recent Labs  04/09/17 2113 04/10/17 0741 04/10/17 1143  GLUCAP 283* 159* 194*     Dg Chest 2 View  Result Date: 04/12/2017 CLINICAL DATA:  Dyspnea.  History COPD. EXAM: CHEST  2 VIEW COMPARISON:  04/06/2017 FINDINGS: There is no focal parenchymal opacity. There is no pleural effusion or pneumothorax. The heart and mediastinal contours are unremarkable. The osseous structures are unremarkable. IMPRESSION: No active cardiopulmonary disease. Electronically Signed   By: Kathreen Devoid   On: 04/12/2017 12:46   X-ray Chest Pa And Lateral  Result Date: 04/06/2017 CLINICAL DATA:  Shortness of breath, CHF, COPD, hypertension, clinical suspicion of pneumonia. EXAM: CHEST  2 VIEW COMPARISON:  Portable chest x-ray of April 05, 2017 FINDINGS: The lungs are well-expanded. There is an azygos lobe anatomy on the right. The pulmonary vascularity is less engorged today. The pulmonary interstitial markings have improved. The cardiac silhouette remains enlarged. There is calcification in the wall of  the aortic arch. Small bilateral pleural effusions blunt the posterior costophrenic angles. IMPRESSION: Interval improvement in CHF.  Underlying COPD. Thoracic aortic atherosclerosis. Electronically Signed   By: David  Martinique M.D.   On: 04/06/2017 13:08   Dg Chest Portable 1 View  Result Date: 04/05/2017 CLINICAL DATA:  Shortness of breath and respiratory failure today. EXAM: PORTABLE CHEST 1 VIEW COMPARISON:  12/28/2016 and prior radiographs FINDINGS: Cardiomegaly noted with pulmonary vascular congestion. Peribronchial thickening is unchanged. Left pleural effusion and left basilar atelectasis has significantly decreased since 12/28/2016. No acute abnormalities are noted. IMPRESSION: Cardiomegaly and pulmonary vascular congestion Electronically Signed   By: Margarette Canada M.D.   On: 04/05/2017 14:46  ASSESSMENT/PLAN:  1. HCAP (healthcare-associated pneumonia) - chest x-ray showed infiltrate right lung base, start Levaquin 500 mg 1 tab by mouth daily 7 days and Florastor 250 mg 1 capsule PO BID X 13 days   2. DM (diabetes mellitus), secondary, uncontrolled, with peripheral vascular complications (HCC) -  Continue Lantus 100 units/ml injected 12 units subcutaneous daily at bedtime, Metformin 500 mg 1 tab PO BID and gabapentin 300 mg 1 capsule by mouth 3 times a day Lab Results  Component Value Date   HGBA1C 8.7 03/17/2017     3. Chronic obstructive pulmonary disease, unspecified COPD type ([place]) - continue O2 at 3 L/minute via [place] continuously, Albuterol PRN, Ipratropium-albuterol0.5-3(2.5)/47ml inhale 1 vial via neb Q 6 hours, budesonide 0.25 mg/2 mL inhale 1 vial via nebulizer twice a day, Mucinex DM ER 1200-60 mg 1 tab by mouth twice a day, Brovana 15 g/2 mL inhale 1 vial via nebulizer twice a day, benzonatate 100 mg 1 capsule by mouth 3 times a day   4. Chronic respiratory failure with hypoxia (HCC) - continue O2 @ 3L/min via [place] continuously, Albuterol PRN, Ipratropium-albuterol0.5-3(2.5)/22ml  inhale 1 vial via neb Q 6 hours, budesonide 0.25 mg/2 mL inhale 1 vial via nebulizer twice a day and  Brovana 15 g/2 mL inhale 1 vial via nebulizer twice a day   5. Recurrent major depressive disorder, remission status unspecified (HCC) - continue duloxetine 60 mg 1 capsule by mouth daily   6. GERD without esophagitis - stable; continue Pantoprazole 40 mg 1 tab PO Q D   7. Insomnia, unspecified type - continue melatonin 3 mg 1 tab by mouth daily at bedtime   8. Hypothyroidism due to acquired atrophy of thyroid - continue Synthroid 75 g 1 tab by mouth daily Lab Results  Component Value Date   TSH 2.95 01/26/2017    9. Essential hypertension - well controlled; continue valsartan 40 mg give 1/2 tab = 20 mg by mouth daily and carvedilol 3.125 mg 1 tab by mouth twice a day   10. Aortic atherosclerosis (HCC) - no chest pains; continue NTG PRN, aspirin 81 mg 1 tab by mouth daily  11. Chronic diastolic CHF - continue Carvedilol 3.125 mg 1 tab by mouth twice a day and Lasix 40 mg 1 tab by mouth daily, check CBC  12. Leukocytosis - has right basal lung infiltrate, check CBC in 1 week Lab Results  Component Value Date   WBC 12.4 (H) 04/09/2017      Goals of care:  Long-term care    Monina C. Campton - NP    [employer] [phone]

## 2017-05-10 ENCOUNTER — Ambulatory Visit: Payer: Medicare Other | Admitting: Acute Care

## 2017-05-10 LAB — BASIC METABOLIC PANEL
BUN: 10 (ref 4–21)
CREATININE: 0.6 (ref 0.5–1.1)
Glucose: 90
Potassium: 4.6 (ref 3.4–5.3)
Sodium: 145 (ref 137–147)

## 2017-05-10 LAB — CBC AND DIFFERENTIAL
HCT: 33 — AB (ref 36–46)
Hemoglobin: 10.9 — AB (ref 12.0–16.0)
Neutrophils Absolute: 5
PLATELETS: 275 (ref 150–399)
WBC: 7.6

## 2017-05-11 ENCOUNTER — Encounter: Payer: Self-pay | Admitting: Adult Health

## 2017-05-11 ENCOUNTER — Non-Acute Institutional Stay (SKILLED_NURSING_FACILITY): Payer: Medicare Other | Admitting: Adult Health

## 2017-05-11 DIAGNOSIS — J9611 Chronic respiratory failure with hypoxia: Secondary | ICD-10-CM | POA: Diagnosis not present

## 2017-05-11 DIAGNOSIS — J309 Allergic rhinitis, unspecified: Secondary | ICD-10-CM

## 2017-05-11 DIAGNOSIS — J449 Chronic obstructive pulmonary disease, unspecified: Secondary | ICD-10-CM | POA: Diagnosis not present

## 2017-05-11 DIAGNOSIS — J189 Pneumonia, unspecified organism: Secondary | ICD-10-CM

## 2017-05-11 DIAGNOSIS — I5032 Chronic diastolic (congestive) heart failure: Secondary | ICD-10-CM

## 2017-05-11 NOTE — Progress Notes (Signed)
DATE:  05/11/2017   MRN:  [id number]  BIRTHDAY: 08/06/1932  Facility:  Nursing Home Location:  [facility] Room Number: 227-B  LEVEL OF CARE:  SNF (31)  Contact Information    Name Relation Home Work [place]  [phone]  [phone]       Code Status History    Date Active Date Inactive Code Status Order ID Comments User Context   04/05/2017  8:59 PM 04/10/2017  3:34 PM DNR [id number]  Louellen Molder, MD Inpatient   12/29/2016 12:47 AM 01/01/2017  4:25 PM DNR [id number]  Etta Quill, DO ED   09/18/2016  5:43 AM 09/20/2016  8:43 PM DNR [id number]  Oswald Hillock, MD Inpatient   08/26/2016  3:06 PM 09/18/2016  2:20 AM DNR [id number]  Lauree Chandler, NP Outpatient   10/25/2014  1:38 AM 10/31/2014 10:19 PM Full Code [id number]  Rise Patience, MD Inpatient   02/22/2014  4:18 PM 02/25/2014  9:53 PM Full Code [id number]  Louellen Molder, MD Inpatient   01/18/2014  8:27 PM 01/27/2014 10:38 PM Full Code [id number]  Eugenie Filler, MD Inpatient    Questions for Most Recent Historical Code Status (Order [id number])    Question Answer Comment   In the event of cardiac or respiratory ARREST Do not call a "code blue"    In the event of cardiac or respiratory ARREST Do not perform Intubation, CPR, defibrillation or ACLS    In the event of cardiac or respiratory ARREST Use medication by any route, position, wound care, and other measures to relive pain and suffering. May use oxygen, suction and manual treatment of airway obstruction as needed for comfort.         Advance Directive Documentation     Most Recent Value  Type of Advance Directive  Out of facility DNR (pink MOST or yellow form)  Pre-existing out of facility DNR order (yellow form or pink MOST form)  Pink MOST form placed in chart (order not valid for inpatient use)  "MOST" Form in Place?  -       Chief Complaint  Patient presents with  . Acute Visit    Shortness of breath     HISTORY OF PRESENT ILLNESS:  This is an 62-YO female seen for an acute visit secondary to shortness of breath.  She is a long-term care resident at [facility].  She has a PMH of chronic hypercapnic respiratory failure secondary to COPD (on O2 at 3 L/minute via [place] continuously), chronic diastolic CHF, peripheral neuropathy, hypertension and depression. Patient was seen today in the room and was wanting to have peanut butter sandwich. She was not gasping for breath. O2 sat 91% with O2 @ 3L/min via [place] continuously. Patient verbalized that she has a bad cold. She is currently on Levaquin for PNA. Latest weight 253.8 lbs, with previous weight 251.6 lbs (gained 2.2 lbs).     PAST MEDICAL HISTORY:  Past Medical History:  Diagnosis Date  . Anxiety state 02/25/2015  . Aortic atherosclerosis ([place]) 12/31/2016  . Aortic atherosclerosis ([place]) 12/31/2016  . Breast CA ([place])   . CKD (chronic kidney disease) stage 3, GFR 30-59 ml/min 12/15/2014  . COPD (chronic obstructive pulmonary disease) ([place])   . Diastolic CHF ([place]) 02/25/7321  . DM (diabetes mellitus), secondary, uncontrolled, with peripheral vascular complications ([place]) 0/25/4270  . GERD without esophagitis 01/19/2016  . Hordeolum externum (stye)    07/01/2016  .  HTN (hypertension) 01/18/2014  . Hypertension   . Hypothyroidism 01/18/2014  . Pneumonia   . Recurrent major depression ([place]) 10/03/2016  . Sigmoid diverticulosis   . Spondylosis, cervical, with myelopathy 04/11/2016     CURRENT MEDICATIONS: Reviewed  Patient's Medications  New Prescriptions   No medications on file  Previous Medications   ACETAMINOPHEN (TYLENOL) 500 MG TABLET    Take 500 mg by mouth every 6 (six) hours as needed (FOR PAIN).    ALBUTEROL (PROVENTIL HFA;VENTOLIN HFA) 108 (90 BASE) MCG/ACT INHALER    Inhale 2 puffs into the lungs every 6 (six) hours as needed for wheezing or shortness of breath.   ARFORMOTEROL (BROVANA) 15 MCG/2ML NEBU    Take 2 mLs  (15 mcg total) by nebulization 2 (two) times daily.   ARTIFICIAL TEAR SOLUTION OP    Place 1 drop into both eyes 4 (four) times daily as needed (for dry eyes).   ASPIRIN 81 MG CHEWABLE TABLET    Chew 1 tablet (81 mg total) by mouth daily.   BENZOCAINE (ORAJEL) 10 % MUCOSAL GEL    Use as directed 1 application in the mouth or throat 3 (three) times daily before meals.   BENZONATATE (TESSALON) 100 MG CAPSULE    Take 1 capsule (100 mg total) by mouth 3 (three) times daily.   BISACODYL (DULCOLAX) 10 MG SUPPOSITORY    Place 10 mg rectally once as needed for mild constipation.    BUDESONIDE (PULMICORT) 0.25 MG/2ML NEBULIZER SOLUTION    Take 0.25 mg by nebulization 2 (two) times daily.   CARVEDILOL (COREG) 3.125 MG TABLET    Take 3.125 mg by mouth 2 (two) times daily with a meal. Hold for HR less than 60 and BP less than 100.   CHOLECALCIFEROL (VITAMIN D) 1000 UNITS TABLET    Take 1,000 Units by mouth daily. Reported on 04/11/2016   CYCLOSPORINE (RESTASIS) 0.05 % OPHTHALMIC EMULSION    Place 1 drop into both eyes 2 (two) times daily.   DEXTROMETHORPHAN-GUAIFENESIN (MUCINEX DM MAXIMUM STRENGTH) 60-1200 MG TB12    Take 1 tablet by mouth 2 (two) times daily. Reported on 04/11/2016   DULOXETINE (CYMBALTA) 60 MG CAPSULE    Take 1 capsule (60 mg total) by mouth daily.   FUROSEMIDE (LASIX) 40 MG TABLET    Take 40 mg by mouth daily. FOR CHF   GABAPENTIN (NEURONTIN) 300 MG CAPSULE    Take 300 mg by mouth 3 (three) times daily.    INSULIN GLARGINE (LANTUS) 100 UNIT/ML INJECTION    Inject 12 Units into the skin at bedtime.   IPRATROPIUM-ALBUTEROL (DUONEB) 0.5-2.5 (3) MG/3ML SOLN    Take 3 mLs by nebulization every 6 (six) hours. FOR COPD   LEVOTHYROXINE (SYNTHROID, LEVOTHROID) 75 MCG TABLET    Take 75 mcg by mouth daily before breakfast.    LOPERAMIDE (IMODIUM) 2 MG CAPSULE    Take 4 mg by mouth daily as needed for diarrhea or loose stools.   MAGNESIUM HYDROXIDE (MILK OF MAGNESIA) 400 MG/5ML SUSPENSION    Take 30 mLs  by mouth once as needed for mild constipation.    MELATONIN 3 MG TABS    Take 3 mg by mouth at bedtime.    METFORMIN (GLUCOPHAGE) 500 MG TABLET    Take 500 mg by mouth 2 (two) times daily with a meal.   NITROGLYCERIN (NITROSTAT) 0.4 MG SL TABLET    Place 0.4 mg under the tongue every 5 (five) minutes as needed for chest pain. CALL  MD IF NO RELIEF AFTER 3 DOSES   OXYGEN    Inhale 3 L into the lungs continuous. FOR HYPOXIA   PANTOPRAZOLE (PROTONIX) 40 MG TABLET    Take 40 mg by mouth daily.    POLYETHYLENE GLYCOL (MIRALAX / GLYCOLAX) PACKET    Take 17 g by mouth daily.   PROPYLENE GLYCOL (SYSTANE BALANCE) 0.6 % SOLN    Place 1 drop into both eyes 2 (two) times daily.   SACCHAROMYCES BOULARDII (FLORASTOR) 250 MG CAPSULE    Take 250 mg by mouth 2 (two) times daily.   SIMETHICONE (MYLICON) 80 MG CHEWABLE TABLET    Chew 1 tablet (80 mg total) by mouth 4 (four) times daily as needed for flatulence.   SODIUM PHOSPHATES (RA SALINE ENEMA) 19-7 GM/118ML ENEM    Place 1 each rectally once as needed (for constipation). AND CALL PHYSICIAN IF NO RELIEF FROM THIS   TRAMADOL (ULTRAM) 50 MG TABLET    Take 1 tablet (50 mg total) by mouth every 8 (eight) hours as needed for severe pain.   TRAZODONE (DESYREL) 50 MG TABLET    Take 25-50 mg by mouth at bedtime. Take 1/2 tablet to = 25 mg qd prn x1 month for anxiety, take 50 qhs for depression   VALSARTAN (DIOVAN) 40 MG TABLET    Take 20 mg by mouth daily. Take 1/2 tablet to = 20 mg  Modified Medications   No medications on file  Discontinued Medications   No medications on file     No Known Allergies   REVIEW OF SYSTEMS:  GENERAL: no change in appetite, no fatigue, no weight changes, no fever, chills or weakness EYES: Denies change in vision, dry eyes, eye pain, itching or discharge EARS: Denies change in hearing, ringing in ears, or earache NOSE: Denies nasal congestion or epistaxis MOUTH and THROAT: Denies oral discomfort, gingival pain or bleeding, pain  from teeth or hoarseness   RESPIRATORY: no cough, DOE, wheezing, hemoptysis, +SOB CARDIAC: no chest pain, edema or palpitations GI: no abdominal pain, diarrhea, constipation, heart burn, nausea or vomiting GU: Denies dysuria, frequency, hematuria, incontinence, or discharge PSYCHIATRIC: Denies feeling of depression or anxiety. No report of hallucinations, insomnia, paranoia, or agitation   PHYSICAL EXAMINATION  GENERAL APPEARANCE: Well nourished. In no acute distress. Morbidly obese SKIN:  Skin is warm and dry.  HEAD: Normal in size and contour. No evidence of trauma EYES: Lids open and close normally. No blepharitis, entropion or ectropion. PERRL. Conjunctivae are clear and sclerae are white. Lenses are without opacity EARS: Pinnae are normal. Patient hears normal voice tunes of the examiner MOUTH and THROAT: Lips are without lesions. Oral mucosa is moist and without lesions. Tongue is normal in shape, size, and color and without lesions NECK: supple, trachea midline, no neck masses, no thyroid tenderness, no thyromegaly RESPIRATORY: breathing is even & unlabored, BS CTAB, has O2 @ 3L/min via [place] continuously CARDIAC: RRR, no murmur,no extra heart sounds GI: abdomen soft, normal BS, no masses, no tenderness, no hepatomegaly, no splenomegaly EXTREMITIES:  Able to move X 4 extremities PSYCHIATRIC: Alert and oriented X 3. Affect and behavior are appropriate   LABS/RADIOLOGY: Labs reviewed: Basic Metabolic Panel:  Recent Labs  09/19/16 0845 09/20/16 0525  04/08/17 0342 04/09/17 0740 04/12/17 1136 05/10/17  NA  --  139  < > 140 141 145 145  K  --  3.5  < > 3.9 3.7 5.0 4.6  CL  --  93*  < > 97* 100*  95*  --   CO2  --  34*  < > 34* 31 42*  --   GLUCOSE  --  97  < > 197* 188* 271*  --   BUN  --  25*  < > 27* 25* 26* 10  CREATININE  --  0.98  < > 0.81 0.82 0.82 0.6  CALCIUM  --  9.3  < > 9.0 8.8* 10.1  --   MG 2.2 2.1  --   --   --   --   --   PHOS 3.8 4.1  --   --   --   --   --    < > = values in this interval not displayed. Liver Function Tests:  Recent Labs  04/05/17 1426 04/08/17 0342 04/09/17 0740  AST 20 15 24   ALT 16 16 17   ALKPHOS 81 69 66  BILITOT 1.5* 0.6 1.1  PROT 6.5 6.2* 6.0*  ALBUMIN 3.3* 2.9* 2.8*   CBC:  Recent Labs  03/17/17 04/05/17 1615 04/06/17 0420 04/08/17 0342 04/09/17 0740 05/10/17  WBC 7.6 12.4* 6.4 10.7* 12.4* 7.6  NEUTROABS 5 11.0*  --   --   --  5  HGB 12.5 12.6 11.9* 12.6 13.2 10.9*  HCT 39 40.4 38.6 40.3 43.4 33*  MCV  --  100.2* 100.3* 99.3 100.5*  --   PLT 204 239 252 325 303 275   CBG:  Recent Labs  04/09/17 2113 04/10/17 0741 04/10/17 1143  GLUCAP 283* 159* 194*      Dg Chest 2 View  Result Date: 04/12/2017 CLINICAL DATA:  Dyspnea.  History COPD. EXAM: CHEST  2 VIEW COMPARISON:  04/06/2017 FINDINGS: There is no focal parenchymal opacity. There is no pleural effusion or pneumothorax. The heart and mediastinal contours are unremarkable. The osseous structures are unremarkable. IMPRESSION: No active cardiopulmonary disease. Electronically Signed   By: Kathreen Devoid   On: 04/12/2017 12:46    ASSESSMENT/PLAN:  1. Chronic respiratory failure with hypoxia (HCC) - continue O2 @ 3L/min via [place], encourage to use incentive spirometer while awake,  Albuterol PRN, Ipratropium-albuterol 0.5-3(2.5)mg/87ml via nebulizer Q 6 hors, Budesonide 0.25 mg/39ml via nebulizer BID and Brovana 15 mcg/80ml via nebulizer BID   2. Chronic obstructive pulmonary disease, unspecified COPD type ([place]) - continue O2 @ 3L/min via , encourage to use incentive spirometer while awake,  Albuterol PRN, Ipratropium-albuterol 0.5-3(2.5)mg/45ml via nebulizer Q 6 hors, Budesonide 0.25 mg/67ml via nebulizer BID, Brovana 15 mcg/42ml via nebulizer BID and Benzonatate 100 mg 1 capsule TID   3. HCAP (healthcare-associated pneumonia) - continue Levaquin 500 mg Q D till 05/12/17 with Probiotic 250 mg 1 capsule BID till 05/15/17   4. Chronic diastolic CHF  (congestive heart failure) (HCC) - increase Lasix from 40 mg to 60 mg PO Q D and start KCL ER 10 meq 1 tab PO Q D, BMP on 05/17/17  5. Allergic rhinitis - start Loratadine 10 mg 1 tab PO Q D      Stephenia Vogan C. Colwyn - NP    [employer] [phone]

## 2017-05-12 ENCOUNTER — Encounter (HOSPITAL_COMMUNITY): Payer: Self-pay | Admitting: Emergency Medicine

## 2017-05-12 ENCOUNTER — Emergency Department (HOSPITAL_COMMUNITY): Payer: Medicare Other

## 2017-05-12 ENCOUNTER — Inpatient Hospital Stay (HOSPITAL_COMMUNITY)
Admission: EM | Admit: 2017-05-12 | Discharge: 2017-05-16 | DRG: 190 | Disposition: A | Discharge status: Skilled Nursing Facility | Payer: Medicare Other | Attending: Family Medicine | Admitting: Family Medicine

## 2017-05-12 DIAGNOSIS — I130 Hypertensive heart and chronic kidney disease with heart failure and stage 1 through stage 4 chronic kidney disease, or unspecified chronic kidney disease: Secondary | ICD-10-CM | POA: Diagnosis present

## 2017-05-12 DIAGNOSIS — I5032 Chronic diastolic (congestive) heart failure: Secondary | ICD-10-CM | POA: Diagnosis present

## 2017-05-12 DIAGNOSIS — J441 Chronic obstructive pulmonary disease with (acute) exacerbation: Secondary | ICD-10-CM | POA: Diagnosis present

## 2017-05-12 DIAGNOSIS — Z801 Family history of malignant neoplasm of trachea, bronchus and lung: Secondary | ICD-10-CM

## 2017-05-12 DIAGNOSIS — K219 Gastro-esophageal reflux disease without esophagitis: Secondary | ICD-10-CM | POA: Diagnosis present

## 2017-05-12 DIAGNOSIS — N183 Chronic kidney disease, stage 3 unspecified: Secondary | ICD-10-CM | POA: Diagnosis present

## 2017-05-12 DIAGNOSIS — Z87891 Personal history of nicotine dependence: Secondary | ICD-10-CM

## 2017-05-12 DIAGNOSIS — F411 Generalized anxiety disorder: Secondary | ICD-10-CM | POA: Diagnosis present

## 2017-05-12 DIAGNOSIS — E1351 Other specified diabetes mellitus with diabetic peripheral angiopathy without gangrene: Secondary | ICD-10-CM | POA: Diagnosis present

## 2017-05-12 DIAGNOSIS — E1151 Type 2 diabetes mellitus with diabetic peripheral angiopathy without gangrene: Secondary | ICD-10-CM | POA: Diagnosis present

## 2017-05-12 DIAGNOSIS — R05 Cough: Secondary | ICD-10-CM

## 2017-05-12 DIAGNOSIS — Z6841 Body Mass Index (BMI) 40.0 and over, adult: Secondary | ICD-10-CM | POA: Diagnosis not present

## 2017-05-12 DIAGNOSIS — Z972 Presence of dental prosthetic device (complete) (partial): Secondary | ICD-10-CM | POA: Diagnosis not present

## 2017-05-12 DIAGNOSIS — IMO0002 Reserved for concepts with insufficient information to code with codable children: Secondary | ICD-10-CM | POA: Diagnosis present

## 2017-05-12 DIAGNOSIS — Z7951 Long term (current) use of inhaled steroids: Secondary | ICD-10-CM

## 2017-05-12 DIAGNOSIS — Z22322 Carrier or suspected carrier of Methicillin resistant Staphylococcus aureus: Secondary | ICD-10-CM | POA: Diagnosis not present

## 2017-05-12 DIAGNOSIS — E039 Hypothyroidism, unspecified: Secondary | ICD-10-CM | POA: Diagnosis present

## 2017-05-12 DIAGNOSIS — Z9012 Acquired absence of left breast and nipple: Secondary | ICD-10-CM | POA: Diagnosis not present

## 2017-05-12 DIAGNOSIS — I700 Atherosclerosis of aorta: Secondary | ICD-10-CM | POA: Diagnosis present

## 2017-05-12 DIAGNOSIS — Z79899 Other long term (current) drug therapy: Secondary | ICD-10-CM

## 2017-05-12 DIAGNOSIS — Z794 Long term (current) use of insulin: Secondary | ICD-10-CM

## 2017-05-12 DIAGNOSIS — Z79891 Long term (current) use of opiate analgesic: Secondary | ICD-10-CM

## 2017-05-12 DIAGNOSIS — E1122 Type 2 diabetes mellitus with diabetic chronic kidney disease: Secondary | ICD-10-CM | POA: Diagnosis present

## 2017-05-12 DIAGNOSIS — I509 Heart failure, unspecified: Secondary | ICD-10-CM

## 2017-05-12 DIAGNOSIS — Z803 Family history of malignant neoplasm of breast: Secondary | ICD-10-CM

## 2017-05-12 DIAGNOSIS — R410 Disorientation, unspecified: Secondary | ICD-10-CM | POA: Diagnosis present

## 2017-05-12 DIAGNOSIS — R059 Cough, unspecified: Secondary | ICD-10-CM

## 2017-05-12 DIAGNOSIS — Z853 Personal history of malignant neoplasm of breast: Secondary | ICD-10-CM

## 2017-05-12 DIAGNOSIS — E1140 Type 2 diabetes mellitus with diabetic neuropathy, unspecified: Secondary | ICD-10-CM | POA: Diagnosis present

## 2017-05-12 DIAGNOSIS — Z66 Do not resuscitate: Secondary | ICD-10-CM | POA: Diagnosis present

## 2017-05-12 DIAGNOSIS — Z7982 Long term (current) use of aspirin: Secondary | ICD-10-CM | POA: Diagnosis not present

## 2017-05-12 DIAGNOSIS — J9621 Acute and chronic respiratory failure with hypoxia: Secondary | ICD-10-CM | POA: Diagnosis present

## 2017-05-12 DIAGNOSIS — E1365 Other specified diabetes mellitus with hyperglycemia: Secondary | ICD-10-CM

## 2017-05-12 DIAGNOSIS — Z9981 Dependence on supplemental oxygen: Secondary | ICD-10-CM

## 2017-05-12 DIAGNOSIS — F319 Bipolar disorder, unspecified: Secondary | ICD-10-CM | POA: Diagnosis present

## 2017-05-12 DIAGNOSIS — Z9049 Acquired absence of other specified parts of digestive tract: Secondary | ICD-10-CM | POA: Diagnosis not present

## 2017-05-12 DIAGNOSIS — J449 Chronic obstructive pulmonary disease, unspecified: Secondary | ICD-10-CM | POA: Diagnosis present

## 2017-05-12 DIAGNOSIS — J189 Pneumonia, unspecified organism: Secondary | ICD-10-CM

## 2017-05-12 LAB — I-STAT TROPONIN, ED: TROPONIN I, POC: 0 ng/mL (ref 0.00–0.08)

## 2017-05-12 LAB — CBC
HCT: 36.4 % (ref 36.0–46.0)
Hemoglobin: 10.8 g/dL — ABNORMAL LOW (ref 12.0–15.0)
MCH: 30.9 pg (ref 26.0–34.0)
MCHC: 29.7 g/dL — ABNORMAL LOW (ref 30.0–36.0)
MCV: 104 fL — ABNORMAL HIGH (ref 78.0–100.0)
PLATELETS: 349 10*3/uL (ref 150–400)
RBC: 3.5 MIL/uL — AB (ref 3.87–5.11)
RDW: 14.8 % (ref 11.5–15.5)
WBC: 9.4 10*3/uL (ref 4.0–10.5)

## 2017-05-12 LAB — BASIC METABOLIC PANEL
ANION GAP: 10 (ref 5–15)
BUN: 9 mg/dL (ref 6–20)
CO2: 35 mmol/L — ABNORMAL HIGH (ref 22–32)
Calcium: 8.9 mg/dL (ref 8.9–10.3)
Chloride: 97 mmol/L — ABNORMAL LOW (ref 101–111)
Creatinine, Ser: 0.65 mg/dL (ref 0.44–1.00)
GLUCOSE: 135 mg/dL — AB (ref 65–99)
POTASSIUM: 4.2 mmol/L (ref 3.5–5.1)
SODIUM: 142 mmol/L (ref 135–145)

## 2017-05-12 LAB — BRAIN NATRIURETIC PEPTIDE: B NATRIURETIC PEPTIDE 5: 158.3 pg/mL — AB (ref 0.0–100.0)

## 2017-05-12 LAB — GLUCOSE, CAPILLARY: GLUCOSE-CAPILLARY: 226 mg/dL — AB (ref 65–99)

## 2017-05-12 MED ORDER — ARFORMOTEROL TARTRATE 15 MCG/2ML IN NEBU: 15.0000 ug | INHALATION_SOLUTION | Freq: Two times a day (BID) | RESPIRATORY_TRACT | Status: DC

## 2017-05-12 MED ORDER — IPRATROPIUM-ALBUTEROL 0.5-2.5 (3) MG/3ML IN SOLN
3.0000 mL | Freq: Four times a day (QID) | RESPIRATORY_TRACT | Status: DC
  Administered 2017-05-12 – 2017-05-15 (×11): 3 mL via RESPIRATORY_TRACT
  Filled 2017-05-12 (×12): qty 3

## 2017-05-12 MED ORDER — VANCOMYCIN HCL IN DEXTROSE 1-5 GM/200ML-% IV SOLN: 1000.0000 mg | Freq: Once | INTRAVENOUS | Status: DC

## 2017-05-12 MED ORDER — GUAIFENESIN ER 600 MG PO TB12
ORAL_TABLET | Freq: Two times a day (BID) | ORAL | Status: DC
  Administered 2017-05-12 – 2017-05-16 (×8): 600 mg via ORAL
  Filled 2017-05-12 (×8): qty 1

## 2017-05-12 MED ORDER — METHYLPREDNISOLONE SODIUM SUCC 125 MG IJ SOLR
125.0000 mg | Freq: Once | INTRAMUSCULAR | Status: AC
  Administered 2017-05-12: 125 mg via INTRAVENOUS
  Filled 2017-05-12: qty 2

## 2017-05-12 MED ORDER — HEPARIN SODIUM (PORCINE) 5000 UNIT/ML IJ SOLN
5000.0000 [IU] | Freq: Three times a day (TID) | INTRAMUSCULAR | Status: DC
  Administered 2017-05-12 – 2017-05-16 (×12): 5000 [IU] via SUBCUTANEOUS
  Filled 2017-05-12 (×12): qty 1

## 2017-05-12 MED ORDER — VANCOMYCIN HCL 10 G IV SOLR
1250.0000 mg | INTRAVENOUS | Status: DC
  Filled 2017-05-12: qty 1250

## 2017-05-12 MED ORDER — SIMETHICONE 80 MG PO CHEW: 80.0000 mg | CHEWABLE_TABLET | Freq: Four times a day (QID) | ORAL | Status: DC | PRN

## 2017-05-12 MED ORDER — INSULIN GLARGINE 100 UNIT/ML ~~LOC~~ SOLN
10.0000 [IU] | Freq: Every day | SUBCUTANEOUS | Status: DC
  Administered 2017-05-12 – 2017-05-15 (×4): 10 [IU] via SUBCUTANEOUS
  Filled 2017-05-12 (×5): qty 0.1

## 2017-05-12 MED ORDER — CYCLOSPORINE 0.05 % OP EMUL
1.0000 [drp] | Freq: Two times a day (BID) | OPHTHALMIC | Status: DC
  Administered 2017-05-12 – 2017-05-16 (×7): 1 [drp] via OPHTHALMIC
  Filled 2017-05-12 (×9): qty 1

## 2017-05-12 MED ORDER — BENZONATATE 100 MG PO CAPS
100.0000 mg | ORAL_CAPSULE | Freq: Three times a day (TID) | ORAL | Status: DC
  Administered 2017-05-12 – 2017-05-16 (×12): 100 mg via ORAL
  Filled 2017-05-12 (×12): qty 1

## 2017-05-12 MED ORDER — ACETAMINOPHEN 500 MG PO TABS
500.0000 mg | ORAL_TABLET | Freq: Four times a day (QID) | ORAL | Status: DC | PRN
  Administered 2017-05-14 – 2017-05-16 (×2): 500 mg via ORAL
  Filled 2017-05-12 (×3): qty 1

## 2017-05-12 MED ORDER — DULOXETINE HCL 60 MG PO CPEP
60.0000 mg | ORAL_CAPSULE | Freq: Every day | ORAL | Status: DC
  Administered 2017-05-13 – 2017-05-16 (×4): 60 mg via ORAL
  Filled 2017-05-12 (×4): qty 1

## 2017-05-12 MED ORDER — TRAMADOL HCL 50 MG PO TABS
50.0000 mg | ORAL_TABLET | Freq: Three times a day (TID) | ORAL | Status: DC | PRN
  Administered 2017-05-16: 50 mg via ORAL
  Filled 2017-05-12: qty 1

## 2017-05-12 MED ORDER — GABAPENTIN 300 MG PO CAPS
300.0000 mg | ORAL_CAPSULE | Freq: Three times a day (TID) | ORAL | Status: DC
  Administered 2017-05-12 – 2017-05-16 (×12): 300 mg via ORAL
  Filled 2017-05-12 (×12): qty 1

## 2017-05-12 MED ORDER — INSULIN ASPART 100 UNIT/ML ~~LOC~~ SOLN: 0.0000 [IU] | Freq: Every day | SUBCUTANEOUS | Status: DC

## 2017-05-12 MED ORDER — CARVEDILOL 3.125 MG PO TABS
3.1250 mg | ORAL_TABLET | Freq: Two times a day (BID) | ORAL | Status: DC
  Administered 2017-05-13 – 2017-05-16 (×8): 3.125 mg via ORAL
  Filled 2017-05-12 (×8): qty 1

## 2017-05-12 MED ORDER — RA SALINE ENEMA 19-7 GM/118ML RE ENEM: 1.0000 | ENEMA | Freq: Once | RECTAL | Status: DC | PRN

## 2017-05-12 MED ORDER — SODIUM CHLORIDE 0.9 % IV SOLN: INTRAVENOUS | Status: DC

## 2017-05-12 MED ORDER — POTASSIUM CHLORIDE CRYS ER 10 MEQ PO TBCR
10.0000 meq | EXTENDED_RELEASE_TABLET | Freq: Every day | ORAL | Status: DC
  Administered 2017-05-13 – 2017-05-16 (×4): 10 meq via ORAL
  Filled 2017-05-12 (×4): qty 1

## 2017-05-12 MED ORDER — SACCHAROMYCES BOULARDII 250 MG PO CAPS
250.0000 mg | ORAL_CAPSULE | Freq: Two times a day (BID) | ORAL | Status: DC
  Administered 2017-05-12 – 2017-05-16 (×8): 250 mg via ORAL
  Filled 2017-05-12 (×8): qty 1

## 2017-05-12 MED ORDER — BUDESONIDE 0.25 MG/2ML IN SUSP
0.2500 mg | Freq: Two times a day (BID) | RESPIRATORY_TRACT | Status: DC
  Administered 2017-05-12 – 2017-05-16 (×8): 0.25 mg via RESPIRATORY_TRACT
  Filled 2017-05-12 (×9): qty 2

## 2017-05-12 MED ORDER — METHYLPREDNISOLONE SODIUM SUCC 40 MG IJ SOLR
40.0000 mg | Freq: Two times a day (BID) | INTRAMUSCULAR | Status: DC
Start: 2017-05-12 — End: 2017-05-14
  Administered 2017-05-13 – 2017-05-14 (×3): 40 mg via INTRAVENOUS
  Filled 2017-05-12 (×3): qty 1

## 2017-05-12 MED ORDER — ALBUTEROL SULFATE (2.5 MG/3ML) 0.083% IN NEBU
5.0000 mg | INHALATION_SOLUTION | Freq: Once | RESPIRATORY_TRACT | Status: AC
  Administered 2017-05-12: 5 mg via RESPIRATORY_TRACT
  Filled 2017-05-12: qty 6

## 2017-05-12 MED ORDER — POLYVINYL ALCOHOL 1.4 % OP SOLN
1.0000 [drp] | Freq: Two times a day (BID) | OPHTHALMIC | Status: DC
  Administered 2017-05-13 – 2017-05-16 (×5): 1 [drp] via OPHTHALMIC
  Filled 2017-05-12: qty 15

## 2017-05-12 MED ORDER — LEVOTHYROXINE SODIUM 75 MCG PO TABS
75.0000 ug | ORAL_TABLET | Freq: Every day | ORAL | Status: DC
Start: 2017-05-13 — End: 2017-05-16
  Administered 2017-05-13 – 2017-05-16 (×4): 75 ug via ORAL
  Filled 2017-05-12 (×4): qty 1

## 2017-05-12 MED ORDER — CEFEPIME HCL 2 G IJ SOLR
2.0000 g | Freq: Once | INTRAMUSCULAR | Status: AC
  Administered 2017-05-12: 2 g via INTRAVENOUS
  Filled 2017-05-12: qty 2

## 2017-05-12 MED ORDER — VITAMIN D 1000 UNITS PO TABS
1000.0000 [IU] | ORAL_TABLET | Freq: Every day | ORAL | Status: DC
  Administered 2017-05-13 – 2017-05-16 (×4): 1000 [IU] via ORAL
  Filled 2017-05-12 (×4): qty 1

## 2017-05-12 MED ORDER — ASPIRIN 81 MG PO CHEW
81.0000 mg | CHEWABLE_TABLET | Freq: Every day | ORAL | Status: DC
  Administered 2017-05-13 – 2017-05-16 (×4): 81 mg via ORAL
  Filled 2017-05-12 (×4): qty 1

## 2017-05-12 MED ORDER — ALBUTEROL SULFATE (2.5 MG/3ML) 0.083% IN NEBU: 3.0000 mL | INHALATION_SOLUTION | Freq: Four times a day (QID) | RESPIRATORY_TRACT | Status: DC | PRN

## 2017-05-12 MED ORDER — INSULIN ASPART 100 UNIT/ML ~~LOC~~ SOLN
0.0000 [IU] | Freq: Three times a day (TID) | SUBCUTANEOUS | Status: DC
  Administered 2017-05-13 – 2017-05-14 (×4): 3 [IU] via SUBCUTANEOUS
  Administered 2017-05-14 (×2): 5 [IU] via SUBCUTANEOUS
  Administered 2017-05-15: 3 [IU] via SUBCUTANEOUS
  Administered 2017-05-15 (×2): 5 [IU] via SUBCUTANEOUS
  Administered 2017-05-16: 2 [IU] via SUBCUTANEOUS
  Administered 2017-05-16: 5 [IU] via SUBCUTANEOUS

## 2017-05-12 MED ORDER — NITROGLYCERIN 0.4 MG SL SUBL: 0.4000 mg | SUBLINGUAL_TABLET | SUBLINGUAL | Status: DC | PRN

## 2017-05-12 MED ORDER — PANTOPRAZOLE SODIUM 40 MG PO TBEC
40.0000 mg | DELAYED_RELEASE_TABLET | Freq: Every day | ORAL | Status: DC
  Administered 2017-05-13 – 2017-05-16 (×4): 40 mg via ORAL
  Filled 2017-05-12 (×4): qty 1

## 2017-05-12 MED ORDER — VANCOMYCIN HCL 10 G IV SOLR
2000.0000 mg | Freq: Once | INTRAVENOUS | Status: DC
  Filled 2017-05-12: qty 2000

## 2017-05-12 MED ORDER — FUROSEMIDE 40 MG PO TABS
60.0000 mg | ORAL_TABLET | Freq: Every day | ORAL | Status: DC
  Administered 2017-05-13 – 2017-05-14 (×2): 60 mg via ORAL
  Filled 2017-05-12 (×2): qty 1

## 2017-05-12 MED ORDER — DEXTROSE 5 % IV SOLN
1.0000 g | Freq: Three times a day (TID) | INTRAVENOUS | Status: DC
  Administered 2017-05-13 (×2): 1 g via INTRAVENOUS
  Filled 2017-05-12 (×3): qty 1

## 2017-05-12 MED ORDER — TRAZODONE HCL 50 MG PO TABS
50.0000 mg | ORAL_TABLET | Freq: Every day | ORAL | Status: DC
  Administered 2017-05-12 – 2017-05-15 (×4): 50 mg via ORAL
  Filled 2017-05-12 (×4): qty 1

## 2017-05-12 MED ORDER — BISACODYL 10 MG RE SUPP: 10.0000 mg | Freq: Once | RECTAL | Status: DC | PRN

## 2017-05-12 MED ORDER — CEFEPIME HCL 1 G IJ SOLR: 1.0000 g | Freq: Three times a day (TID) | INTRAMUSCULAR | Status: DC

## 2017-05-12 MED ORDER — INSULIN GLARGINE 100 UNITS/ML SOLOSTAR PEN
10.0000 [IU] | PEN_INJECTOR | Freq: Every day | SUBCUTANEOUS | Status: DC
  Filled 2017-05-12: qty 3

## 2017-05-12 MED ORDER — ALBUTEROL SULFATE (2.5 MG/3ML) 0.083% IN NEBU: 5.0000 mg | INHALATION_SOLUTION | Freq: Once | RESPIRATORY_TRACT | Status: DC

## 2017-05-12 MED ORDER — MELATONIN 3 MG PO TABS
3.0000 mg | ORAL_TABLET | Freq: Every day | ORAL | Status: DC
  Administered 2017-05-12 – 2017-05-15 (×4): 3 mg via ORAL
  Filled 2017-05-12 (×5): qty 1

## 2017-05-12 MED ORDER — BENZOCAINE 10 % MT GEL
1.0000 "application " | Freq: Three times a day (TID) | OROMUCOSAL | Status: DC
  Administered 2017-05-13 – 2017-05-16 (×10): 1 via OROMUCOSAL
  Filled 2017-05-12: qty 9

## 2017-05-12 MED ORDER — IPRATROPIUM BROMIDE 0.02 % IN SOLN
0.5000 mg | Freq: Once | RESPIRATORY_TRACT | Status: AC
  Administered 2017-05-12: 0.5 mg via RESPIRATORY_TRACT
  Filled 2017-05-12: qty 2.5

## 2017-05-12 NOTE — H&P (Signed)
History and Physical    Briony Parveen ZOX:[id number] DOB: 1932/06/27 DOA: 05/12/2017  PCP: Hendricks Limes, MD  Patient coming from: Home  Chief Complaint: Cough, wheezes, increased sputum production  HPI: Dawn Montoya is a 83 y.o. female with medical history significant of COPD, diastolic heart failure, DM, GERD, hypertension, and hypothyroidism. Presenting with more than 1 week complaint of increased shortness of breath above her baseline. Patient states that within that timeframe she had the problems listed above. Nothing she is aware of makes it better increase activity makes her shortness of breath worse. She does admit to having a sick contact her roommate who reportedly reports with the same symptoms. Patient has a long smoking history of more than 60 years. Patient denies any hemoptysis  ED Course: Patient was found to be requiring increase oxygen levels. Also had wheezes on exam.   Review of Systems: As per HPI otherwise 10 point review of systems negative.    Past Medical History:  Diagnosis Date  . Anxiety state 02/25/2015  . Aortic atherosclerosis ([place]) 12/31/2016  . Aortic atherosclerosis ([place]) 12/31/2016  . Breast CA ([place])   . CKD (chronic kidney disease) stage 3, GFR 30-59 ml/min 12/15/2014  . COPD (chronic obstructive pulmonary disease) ([place])   . Diastolic CHF ([place]) 05/24/1913  . DM (diabetes mellitus), secondary, uncontrolled, with peripheral vascular complications ([place]) 7/82/9562  . GERD without esophagitis 01/19/2016  . Hordeolum externum (stye)    07/01/2016  . HTN (hypertension) 01/18/2014  . Hypertension   . Hypothyroidism 01/18/2014  . Pneumonia   . Recurrent major depression ([place]) 10/03/2016  . Sigmoid diverticulosis   . Spondylosis, cervical, with myelopathy 04/11/2016    Past Surgical History:  Procedure Laterality Date  . BREAST SURGERY    . CHOLECYSTECTOMY    . COLONOSCOPY N/A 01/20/2014   Procedure: COLONOSCOPY;  Surgeon: Winfield Cunas., MD;   Location: [facility];  Service: Endoscopy;  Laterality: N/A;  . ESOPHAGOGASTRODUODENOSCOPY N/A 01/19/2014   Procedure: ESOPHAGOGASTRODUODENOSCOPY (EGD);  Surgeon: Jeryl Columbia, MD;  Location: [facility];  Service: Endoscopy;  Laterality: N/A;  . MASTECTOMY Left      reports that she quit smoking about 17 years ago. Her smoking use included Cigarettes. She has a 130.00 pack-year smoking history. She has never used smokeless tobacco. She reports that she does not drink alcohol or use drugs.  No Known Allergies  Family History  Problem Relation Age of Onset  . Lung cancer Sister   . Breast cancer Sister   . Breast cancer Sister   . Breast cancer Daughter   . Heart attack Neg Hx     Prior to Admission medications   Medication Sig Start Date End Date Taking? Authorizing Provider  acetaminophen (TYLENOL) 500 MG tablet Take 500 mg by mouth every 6 (six) hours as needed (FOR PAIN).     [provider]  albuterol (PROVENTIL HFA;VENTOLIN HFA) 108 (90 Base) MCG/ACT inhaler Inhale 2 puffs into the lungs every 6 (six) hours as needed for wheezing or shortness of breath.    [provider]  arformoterol (BROVANA) 15 MCG/2ML NEBU Take 2 mLs (15 mcg total) by nebulization 2 (two) times daily. 10/04/16   Parrett, Fonnie Mu, NP  ARTIFICIAL TEAR SOLUTION OP Place 1 drop into both eyes 4 (four) times daily as needed (for dry eyes).    [provider]  aspirin 81 MG chewable tablet Chew 1 tablet (81 mg total) by mouth daily. 01/02/17   Rama, Venetia Maxon,  MD  benzocaine (ORAJEL) 10 % mucosal gel Use as directed 1 application in the mouth or throat 3 (three) times daily before meals.    [provider]  benzonatate (TESSALON) 100 MG capsule Take 1 capsule (100 mg total) by mouth 3 (three) times daily. 01/01/17   Rama, Venetia Maxon, MD  bisacodyl (DULCOLAX) 10 MG suppository Place 10 mg rectally once as needed for mild constipation.     [provider]  budesonide  (PULMICORT) 0.25 MG/2ML nebulizer solution Take 0.25 mg by nebulization 2 (two) times daily.    [provider]  carvedilol (COREG) 3.125 MG tablet Take 3.125 mg by mouth 2 (two) times daily with a meal. Hold for HR less than 60 and BP less than 100.    [provider]  cholecalciferol (VITAMIN D) 1000 UNITS tablet Take 1,000 Units by mouth daily. Reported on 04/11/2016    [provider]  cycloSPORINE (RESTASIS) 0.05 % ophthalmic emulsion Place 1 drop into both eyes 2 (two) times daily.    [provider]  Dextromethorphan-Guaifenesin (MUCINEX DM MAXIMUM STRENGTH) 60-1200 MG TB12 Take 1 tablet by mouth 2 (two) times daily. Reported on 04/11/2016    [provider]  DULoxetine (CYMBALTA) 60 MG capsule Take 1 capsule (60 mg total) by mouth daily. 03/29/15   Lauree Chandler, NP  furosemide (LASIX) 40 MG tablet Take 40 mg by mouth daily. FOR CHF    [provider]  gabapentin (NEURONTIN) 300 MG capsule Take 300 mg by mouth 3 (three) times daily.     [provider]  insulin glargine (LANTUS) 100 UNIT/ML injection Inject 12 Units into the skin at bedtime.    [provider]  ipratropium-albuterol (DUONEB) 0.5-2.5 (3) MG/3ML SOLN Take 3 mLs by nebulization every 6 (six) hours. FOR COPD    [provider]  levothyroxine (SYNTHROID, LEVOTHROID) 75 MCG tablet Take 75 mcg by mouth daily before breakfast.     [provider]  loperamide (IMODIUM) 2 MG capsule Take 4 mg by mouth daily as needed for diarrhea or loose stools.    [provider]  magnesium hydroxide (MILK OF MAGNESIA) 400 MG/5ML suspension Take 30 mLs by mouth once as needed for mild constipation.     [provider]  Melatonin 3 MG TABS Take 3 mg by mouth at bedtime.     [provider]  metFORMIN (GLUCOPHAGE) 500 MG tablet Take 500 mg by mouth 2 (two) times daily with a meal.    [provider]  nitroGLYCERIN (NITROSTAT)  0.4 MG SL tablet Place 0.4 mg under the tongue every 5 (five) minutes as needed for chest pain. CALL MD IF NO RELIEF AFTER 3 DOSES    [provider]  OXYGEN Inhale 3 L into the lungs continuous. FOR HYPOXIA    [provider]  pantoprazole (PROTONIX) 40 MG tablet Take 40 mg by mouth daily.     [provider]  polyethylene glycol (MIRALAX / GLYCOLAX) packet Take 17 g by mouth daily. 01/02/17   Rama, Venetia Maxon, MD  Propylene Glycol (SYSTANE BALANCE) 0.6 % SOLN Place 1 drop into both eyes 2 (two) times daily.    [provider]  saccharomyces boulardii (FLORASTOR) 250 MG capsule Take 250 mg by mouth 2 (two) times daily. 05/04/17 05/16/17  [provider]  simethicone (MYLICON) 80 MG chewable tablet Chew 1 tablet (80 mg total) by mouth 4 (four) times daily as needed for flatulence. 01/01/17   Rama,  Venetia Maxon, MD  Sodium Phosphates (RA SALINE ENEMA) 19-7 GM/118ML ENEM Place 1 each rectally once as needed (for constipation). AND CALL PHYSICIAN IF NO RELIEF FROM THIS    [provider]  traMADol (ULTRAM) 50 MG tablet Take 1 tablet (50 mg total) by mouth every 8 (eight) hours as needed for severe pain. 04/10/17   Dhungel, Flonnie Overman, MD  traZODone (DESYREL) 50 MG tablet Take 25-50 mg by mouth at bedtime. Take 1/2 tablet to = 25 mg qd prn x1 month for anxiety, take 50 qhs for depression 05/11/17 06/09/17  [provider]  valsartan (DIOVAN) 40 MG tablet Take 20 mg by mouth daily. Take 1/2 tablet to = 20 mg    [provider]    Physical Exam: Vitals:   05/12/17 1600 05/12/17 1630 05/12/17 1715 05/12/17 1800  BP: 107/84 (!) 122/47 (!) 115/40 126/86  Pulse: 86 80 87 83  Resp: (!) 21 19 (!) 21 16  Temp:      TempSrc:      SpO2: 99% 99% 98% 93%    Constitutional: NAD, calm, comfortable Vitals:   05/12/17 1600 05/12/17 1630 05/12/17 1715 05/12/17 1800  BP: 107/84 (!) 122/47 (!) 115/40 126/86  Pulse: 86 80 87 83  Resp: (!) 21 19 (!) 21  16  Temp:      TempSrc:      SpO2: 99% 99% 98% 93%   Eyes: PERRL, lids and conjunctivae normal ENMT: Mucous membranes are moist. Posterior pharynx clear of any exudate or lesions.Normal dentition.  Neck: normal, supple, no masses, no thyromegaly Respiratory: Prolonged expiratory phase, equal chest rise, expiratory wheezes diffusely Cardiovascular: Regular rate and rhythm, no murmurs / rubs / gallops. No extremity edema. 2+ pedal pulses. No carotid bruits.  Abdomen: no tenderness, no masses palpated. No hepatosplenomegaly. Bowel sounds positive.  Musculoskeletal: no clubbing / cyanosis. No joint deformity upper and lower extremities. Good ROM, no contractures. Normal muscle tone.  Skin: no rashes, lesions, ulcers. No induration Neurologic: No facial asymmetry Sensation intact,   Psychiatric: Normal judgment and insight. Normal mood.    Labs on Admission: I have personally reviewed following labs and imaging studies  CBC:  Recent Labs Lab 05/10/17 05/12/17 1541  WBC 7.6 9.4  NEUTROABS 5  --   HGB 10.9* 10.8*  HCT 33* 36.4  MCV  --  104.0*  PLT 275 637   Basic Metabolic Panel:  Recent Labs Lab 05/10/17 05/12/17 1541  NA 145 142  K 4.6 4.2  CL  --  97*  CO2  --  35*  GLUCOSE  --  135*  BUN 10 9  CREATININE 0.6 0.65  CALCIUM  --  8.9   GFR: Estimated Creatinine Clearance: 65.2 mL/min (by C-G formula based on SCr of 0.65 mg/dL). Liver Function Tests: No results for input(s): AST, ALT, ALKPHOS, BILITOT, PROT, ALBUMIN in the last 168 hours. No results for input(s): LIPASE, AMYLASE in the last 168 hours. No results for input(s): AMMONIA in the last 168 hours. Coagulation Profile: No results for input(s): INR, PROTIME in the last 168 hours. Cardiac Enzymes: No results for input(s): CKTOTAL, CKMB, CKMBINDEX, TROPONINI in the last 168 hours. BNP (last 3 results)  Recent Labs  04/12/17 1136  PROBNP 153.0*   HbA1C: No results for input(s): HGBA1C in the last 72  hours. CBG: No results for input(s): GLUCAP in the last 168 hours. Lipid Profile: No results for input(s): CHOL, HDL, LDLCALC, TRIG, CHOLHDL, LDLDIRECT in the last 72 hours. Thyroid  Function Tests: No results for input(s): TSH, T4TOTAL, FREET4, T3FREE, THYROIDAB in the last 72 hours. Anemia Panel: No results for input(s): VITAMINB12, FOLATE, FERRITIN, TIBC, IRON, RETICCTPCT in the last 72 hours. Urine analysis:    Component Value Date/Time   COLORURINE AMBER (A) 04/05/2017 0002   APPEARANCEUR HAZY (A) 04/05/2017 0002   LABSPEC 1.032 (H) 04/05/2017 0002   PHURINE 5.0 04/05/2017 0002   GLUCOSEU 50 (A) 04/05/2017 0002   HGBUR NEGATIVE 04/05/2017 0002   BILIRUBINUR NEGATIVE 04/05/2017 0002   KETONESUR 80 (A) 04/05/2017 0002   PROTEINUR 30 (A) 04/05/2017 0002   UROBILINOGEN 1.0 10/24/2014 2147   NITRITE NEGATIVE 04/05/2017 0002   LEUKOCYTESUR NEGATIVE 04/05/2017 0002    Radiological Exams on Admission: Dg Chest Port 1 View  Result Date: 05/12/2017 CLINICAL DATA:  Shortness of breath.  Wheezing. EXAM: PORTABLE CHEST 1 VIEW COMPARISON:  04/12/2017 FINDINGS: There is haziness at the lung bases, right greater than left. This could represent pneumonia or pulmonary edema. The heart size and pulmonary vascularity appear normal. Calcification in the arch of the aorta. No acute bone abnormality. IMPRESSION: Hazy infiltrates at the lung bases, right more than left. This could represent pneumonia or pulmonary edema. Aortic atherosclerosis. Electronically Signed   By: Lorriane Shire M.D.   On: 05/12/2017 16:12    EKG: Independently reviewed. Sinus rhythm with no ST elevations or depressions  Assessment/Plan Active Problems:    COPD with acute exacerbation (HCC) - Continue supplemental oxygen as needed, bronchodilators, will place on antibiotics with cefepime and vancomycin initially, and we'll place on Solu-Medrol.    Acute on chronic respiratory failure with hypoxemia (HCC) - Most likely  secondary to infectious etiology given chest x-ray reports. - Supplemental oxygen and antibiotics at this juncture.  Diastolic heart failure - Chronic in nature. BNP ordered and only mildly elevated. With last value 153.    CKD (chronic kidney disease) stage 3, GFR 30-59 ml/min - Stable currently continue to monitor last serum creatinine 0.6    GERD without esophagitis - Stable currently. Will cover with PPI especially while on steroids    DM (diabetes mellitus), secondary, uncontrolled, with peripheral vascular complications ([place]) - Place on diabetic diet, sliding scale insulin with night coverage    DVT prophylaxis: Heparin Code Status: DO NOT RESUSCITATE Family Communication: None at bedside Disposition Plan: MedSurg Consults called: None Admission status: Inpatient as I suspect patient will be in house for more than 2 midnight   Velvet Bathe MD [facility] Pager [phone]  If 7PM-7AM, please contact night-coverage [email or web address] Password [email or web address]  05/12/2017, 6:10 PM

## 2017-05-12 NOTE — Progress Notes (Signed)
Pharmacy Antibiotic Note  Marrah Vanevery is a 83 y.o. female admitted on 05/12/2017 with pneumonia.  Pharmacy has been consulted for vancomycin and cefepime dosing. Pt is afebrile and WBC is WNL. SCr is WNL. Pt recently started on outpatient levaquin but no improvement.   Plan: Vanc 2gm IV x 1 then 1250mg  IV Q24H Cefepime 2gm IV x 1 then 1gm IV Q8H F/u renal fxn, C&S, clinical status and trough at      Temp (24hrs), Avg:98.3 F (36.8 C), Min:98.3 F (36.8 C), Max:98.3 F (36.8 C)   Recent Labs Lab 05/10/17 05/12/17 1541  WBC 7.6 9.4  CREATININE 0.6 0.65    Estimated Creatinine Clearance: 65.2 mL/min (by C-G formula based on SCr of 0.65 mg/dL).    No Known Allergies  Antimicrobials this admission: Vanc 7/20>> Cefepime 7/20>>  Dose adjustments this admission: N/A  Microbiology results: Pending  Thank you for allowing pharmacy to be a part of this patient's care.  Kristine Tiley, Rande Lawman 05/12/2017 5:00 PM

## 2017-05-12 NOTE — ED Notes (Signed)
Called lab to add on BNP. ?

## 2017-05-12 NOTE — ED Notes (Signed)
Attempted report x1. 

## 2017-05-12 NOTE — ED Triage Notes (Signed)
Pt from [place]. Diagnosed with PNA on 7/11 and sent home on antibiotics. Pt's sats in 80's today at facility. Staff gave her breathing treatment. EMS gave 0.5 albuterol and atrovent. Pt wheezing throughout. Pt is alert and oriented.

## 2017-05-12 NOTE — ED Provider Notes (Signed)
[facility] DEPT Provider Note   CSN: [id number] Arrival date & time: 05/12/17  1515     History   Chief Complaint Chief Complaint  Patient presents with  . Shortness of Breath    HPI Dawn Montoya is a 83 y.o. female.  Patient with hx copd, c/o increased sob in the past 2-3 days. Symptoms persistent, worsening, moderate-severe.  Uses home o2 continuous 3 liters. Occasional non prod cough. No sore throat. Denies fever or chills. +tightness in chest for past couple days. At rest. No exertional cp. Denies leg pain or swelling. Compliant w normal meds.    The history is provided by the patient.  Shortness of Breath  Pertinent negatives include no fever, no headaches, no sore throat, no neck pain, no chest pain, no vomiting, no abdominal pain, no rash and no leg swelling.    Past Medical History:  Diagnosis Date  . Anxiety state 02/25/2015  . Aortic atherosclerosis ([place]) 12/31/2016  . Aortic atherosclerosis ([place]) 12/31/2016  . Breast CA ([place])   . CKD (chronic kidney disease) stage 3, GFR 30-59 ml/min 12/15/2014  . COPD (chronic obstructive pulmonary disease) ([place])   . Diastolic CHF ([place]) 04/30/2422  . DM (diabetes mellitus), secondary, uncontrolled, with peripheral vascular complications ([place]) 5/36/1443  . GERD without esophagitis 01/19/2016  . Hordeolum externum (stye)    07/01/2016  . HTN (hypertension) 01/18/2014  . Hypertension   . Hypothyroidism 01/18/2014  . Pneumonia   . Recurrent major depression ([place]) 10/03/2016  . Sigmoid diverticulosis   . Spondylosis, cervical, with myelopathy 04/11/2016    Patient Active Problem List   Diagnosis Date Noted  . DM (diabetes mellitus), secondary, uncontrolled, with peripheral vascular complications ([place]) 15/40/0867  . Sepsis ([place]) 04/10/2017  . Acute respiratory failure with hypoxia and hypercapnia ([place]) 04/05/2017  . HCAP (healthcare-associated pneumonia) 04/05/2017  . Acute metabolic encephalopathy 61/95/0932  . Chronic  respiratory failure ([place]) 02/06/2017  . Osteoarthritis 12/31/2016  . DDD (degenerative disc disease), lumbar 12/31/2016  . Aortic atherosclerosis ([place]) 12/31/2016  . Sacral pain 12/30/2016  . Severe obesity (BMI >= 40) ([place]) 12/29/2016  . MRSA carrier 12/29/2016  . Ill-fitting dentures 12/28/2016  . Recurrent major depression ([place]) 10/03/2016  . COPD exacerbation ([place]) 09/18/2016  . Pulmonary edema 09/18/2016  . Spondylosis, cervical, with myelopathy 04/11/2016  . Meningioma ([place]) 04/11/2016  . GERD without esophagitis 01/19/2016  . Benign paroxysmal positional vertigo 07/18/2015  . Neuropathy 07/13/2015  . Cervical myelopathy with cervical radiculopathy 05/08/2015  . Anxiety state 02/25/2015  . Solitary pulmonary nodule 01/01/2015  . CKD (chronic kidney disease) stage 3, GFR 30-59 ml/min 12/15/2014  . Carbon dioxide retention 12/15/2014  . Uncontrolled hypertension 10/25/2014  . Hypertensive heart disease with acute on chronic diastolic congestive heart failure ([place]) 10/24/2014  . Acute on chronic diastolic CHF (congestive heart failure) ([place]) 02/22/2014  . COPD (chronic obstructive pulmonary disease) ([place]) 01/18/2014  . Acute on chronic respiratory failure with hypoxemia ([place]) 01/18/2014  . Anemia 01/18/2014  . Hypothyroidism 01/18/2014    Past Surgical History:  Procedure Laterality Date  . BREAST SURGERY    . CHOLECYSTECTOMY    . COLONOSCOPY N/A 01/20/2014   Procedure: COLONOSCOPY;  Surgeon: Winfield Cunas., MD;  Location: [facility];  Service: Endoscopy;  Laterality: N/A;  . ESOPHAGOGASTRODUODENOSCOPY N/A 01/19/2014   Procedure: ESOPHAGOGASTRODUODENOSCOPY (EGD);  Surgeon: Jeryl Columbia, MD;  Location: [facility];  Service: Endoscopy;  Laterality: N/A;  . MASTECTOMY Left     OB History  No data available       Home Medications    Prior to Admission medications   Medication Sig Start Date End Date Taking? Authorizing Provider  acetaminophen (TYLENOL) 500  MG tablet Take 500 mg by mouth every 6 (six) hours as needed (FOR PAIN).     [provider]  albuterol (PROVENTIL HFA;VENTOLIN HFA) 108 (90 Base) MCG/ACT inhaler Inhale 2 puffs into the lungs every 6 (six) hours as needed for wheezing or shortness of breath.    [provider]  arformoterol (BROVANA) 15 MCG/2ML NEBU Take 2 mLs (15 mcg total) by nebulization 2 (two) times daily. 10/04/16   Parrett, Fonnie Mu, NP  ARTIFICIAL TEAR SOLUTION OP Place 1 drop into both eyes 4 (four) times daily as needed (for dry eyes).    [provider]  aspirin 81 MG chewable tablet Chew 1 tablet (81 mg total) by mouth daily. 01/02/17   Rama, Venetia Maxon, MD  benzocaine (ORAJEL) 10 % mucosal gel Use as directed 1 application in the mouth or throat 3 (three) times daily before meals.    [provider]  benzonatate (TESSALON) 100 MG capsule Take 1 capsule (100 mg total) by mouth 3 (three) times daily. 01/01/17   Rama, Venetia Maxon, MD  bisacodyl (DULCOLAX) 10 MG suppository Place 10 mg rectally once as needed for mild constipation.     [provider]  budesonide (PULMICORT) 0.25 MG/2ML nebulizer solution Take 0.25 mg by nebulization 2 (two) times daily.    [provider]  carvedilol (COREG) 3.125 MG tablet Take 3.125 mg by mouth 2 (two) times daily with a meal. Hold for HR less than 60 and BP less than 100.    [provider]  cholecalciferol (VITAMIN D) 1000 UNITS tablet Take 1,000 Units by mouth daily. Reported on 04/11/2016    [provider]  cycloSPORINE (RESTASIS) 0.05 % ophthalmic emulsion Place 1 drop into both eyes 2 (two) times daily.    [provider]  Dextromethorphan-Guaifenesin (MUCINEX DM MAXIMUM STRENGTH) 60-1200 MG TB12 Take 1 tablet by mouth 2 (two) times daily. Reported on 04/11/2016    [provider]  DULoxetine (CYMBALTA) 60 MG capsule Take 1 capsule (60 mg total) by mouth daily. 03/29/15   Lauree Chandler, NP    furosemide (LASIX) 40 MG tablet Take 40 mg by mouth daily. FOR CHF    [provider]  gabapentin (NEURONTIN) 300 MG capsule Take 300 mg by mouth 3 (three) times daily.     [provider]  insulin glargine (LANTUS) 100 UNIT/ML injection Inject 12 Units into the skin at bedtime.    [provider]  ipratropium-albuterol (DUONEB) 0.5-2.5 (3) MG/3ML SOLN Take 3 mLs by nebulization every 6 (six) hours. FOR COPD    [provider]  levothyroxine (SYNTHROID, LEVOTHROID) 75 MCG tablet Take 75 mcg by mouth daily before breakfast.     [provider]  loperamide (IMODIUM) 2 MG capsule Take 4 mg by mouth daily as needed for diarrhea or loose stools.    [provider]  magnesium hydroxide (MILK OF MAGNESIA) 400 MG/5ML suspension Take 30 mLs by mouth once as needed for mild constipation.     [provider]  Melatonin 3 MG TABS Take 3 mg by mouth at bedtime.     [provider]  metFORMIN (GLUCOPHAGE) 500 MG tablet Take 500 mg by mouth 2 (two) times daily with a meal.    [provider]  nitroGLYCERIN (NITROSTAT)  0.4 MG SL tablet Place 0.4 mg under the tongue every 5 (five) minutes as needed for chest pain. CALL MD IF NO RELIEF AFTER 3 DOSES    [provider]  OXYGEN Inhale 3 L into the lungs continuous. FOR HYPOXIA    [provider]  pantoprazole (PROTONIX) 40 MG tablet Take 40 mg by mouth daily.     [provider]  polyethylene glycol (MIRALAX / GLYCOLAX) packet Take 17 g by mouth daily. 01/02/17   Rama, Venetia Maxon, MD  Propylene Glycol (SYSTANE BALANCE) 0.6 % SOLN Place 1 drop into both eyes 2 (two) times daily.    [provider]  saccharomyces boulardii (FLORASTOR) 250 MG capsule Take 250 mg by mouth 2 (two) times daily. 05/04/17 05/16/17  [provider]  simethicone (MYLICON) 80 MG chewable tablet Chew 1 tablet (80 mg total) by mouth 4 (four) times daily as needed for  flatulence. 01/01/17   Rama, Venetia Maxon, MD  Sodium Phosphates (RA SALINE ENEMA) 19-7 GM/118ML ENEM Place 1 each rectally once as needed (for constipation). AND CALL PHYSICIAN IF NO RELIEF FROM THIS    [provider]  traMADol (ULTRAM) 50 MG tablet Take 1 tablet (50 mg total) by mouth every 8 (eight) hours as needed for severe pain. 04/10/17   Dhungel, Flonnie Overman, MD  traZODone (DESYREL) 50 MG tablet Take 25-50 mg by mouth at bedtime. Take 1/2 tablet to = 25 mg qd prn x1 month for anxiety, take 50 qhs for depression 05/11/17 06/09/17  [provider]  valsartan (DIOVAN) 40 MG tablet Take 20 mg by mouth daily. Take 1/2 tablet to = 20 mg    [provider]    Family History Family History  Problem Relation Age of Onset  . Lung cancer Sister   . Breast cancer Sister   . Breast cancer Sister   . Breast cancer Daughter   . Heart attack Neg Hx     Social History Social History  Substance Use Topics  . Smoking status: Former Smoker    Packs/day: 2.00    Years: 65.00    Types: Cigarettes    Quit date: 10/25/1999  . Smokeless tobacco: Never Used  . Alcohol use No     Allergies   Patient has no known allergies.   Review of Systems Review of Systems  Constitutional: Negative for chills and fever.  HENT: Negative for sore throat.   Eyes: Negative for redness.  Respiratory: Positive for shortness of breath.   Cardiovascular: Negative for chest pain and leg swelling.  Gastrointestinal: Negative for abdominal pain and vomiting.  Endocrine: Negative for polyuria.  Genitourinary: Negative for dysuria and flank pain.  Musculoskeletal: Negative for back pain and neck pain.  Skin: Negative for rash.  Neurological: Negative for headaches.  Hematological: Does not bruise/bleed easily.  Psychiatric/Behavioral: Negative for confusion.     Physical Exam Updated Vital Signs BP (!) 142/69 (BP Location: Right Arm)   Pulse 82   Temp 98.3 F (36.8 C) (Oral)   Resp (!)  22   SpO2 100%   Physical Exam  Constitutional: She appears well-developed and well-nourished. No distress.  HENT:  Mouth/Throat: Oropharynx is clear and moist.  Eyes: Conjunctivae are normal. No scleral icterus.  Neck: Neck supple. No JVD present. No tracheal deviation present.  Cardiovascular: Normal rate, regular rhythm, normal heart sounds and intact distal pulses.  Exam reveals no gallop and no friction rub.   No murmur heard. Pulmonary/Chest: Effort normal. No respiratory  distress. She has wheezes.      Abdominal: Soft. Normal appearance and bowel sounds are normal. She exhibits no distension. There is no tenderness.  Genitourinary:  Genitourinary Comments: No cva tenderness  Musculoskeletal: She exhibits no edema.  Neurological: She is alert.  Skin: Skin is warm and dry. No rash noted. She is not diaphoretic.  Psychiatric: She has a normal mood and affect.  Nursing note and vitals reviewed.    ED Treatments / Results  Labs (all labs ordered are listed, but only abnormal results are displayed)   Results for orders placed or performed during the hospital encounter of 05/12/17  CBC  Result Value Ref Range   WBC 9.4 4.0 - 10.5 K/uL   RBC 3.50 (L) 3.87 - 5.11 MIL/uL   Hemoglobin 10.8 (L) 12.0 - 15.0 g/dL   HCT 36.4 36.0 - 46.0 %   MCV 104.0 (H) 78.0 - 100.0 fL   MCH 30.9 26.0 - 34.0 pg   MCHC 29.7 (L) 30.0 - 36.0 g/dL   RDW 14.8 11.5 - 15.5 %   Platelets 349 150 - 400 K/uL  Basic metabolic panel  Result Value Ref Range   Sodium 142 135 - 145 mmol/L   Potassium 4.2 3.5 - 5.1 mmol/L   Chloride 97 (L) 101 - 111 mmol/L   CO2 35 (H) 22 - 32 mmol/L   Glucose, Bld 135 (H) 65 - 99 mg/dL   BUN 9 6 - 20 mg/dL   Creatinine, Ser 0.65 0.44 - 1.00 mg/dL   Calcium 8.9 8.9 - 10.3 mg/dL   GFR calc non Af Amer >60 >60 mL/min   GFR calc Af Amer >60 >60 mL/min   Anion gap 10 5 - 15  I-stat troponin, ED  Result Value Ref Range   Troponin i, poc 0.00 0.00 - 0.08 ng/mL    Comment 3           Dg Chest Port 1 View  Result Date: 05/12/2017 CLINICAL DATA:  Shortness of breath.  Wheezing. EXAM: PORTABLE CHEST 1 VIEW COMPARISON:  04/12/2017 FINDINGS: There is haziness at the lung bases, right greater than left. This could represent pneumonia or pulmonary edema. The heart size and pulmonary vascularity appear normal. Calcification in the arch of the aorta. No acute bone abnormality. IMPRESSION: Hazy infiltrates at the lung bases, right more than left. This could represent pneumonia or pulmonary edema. Aortic atherosclerosis. Electronically Signed   By: Lorriane Shire M.D.   On: 05/12/2017 16:12     EKG  EKG Interpretation  Date/Time:  Friday May 12 2017 15:27:26 EDT Ventricular Rate:  86 PR Interval:    QRS Duration: 96 QT Interval:  358 QTC Calculation: 429 R Axis:   71 Text Interpretation:  Sinus rhythm Nonspecific T wave abnormality Confirmed by Lajean Saver [phone]) on 05/12/2017 3:48:03 PM       Radiology Dg Chest Port 1 View  Result Date: 05/12/2017 CLINICAL DATA:  Shortness of breath.  Wheezing. EXAM: PORTABLE CHEST 1 VIEW COMPARISON:  04/12/2017 FINDINGS: There is haziness at the lung bases, right greater than left. This could represent pneumonia or pulmonary edema. The heart size and pulmonary vascularity appear normal. Calcification in the arch of the aorta. No acute bone abnormality. IMPRESSION: Hazy infiltrates at the lung bases, right more than left. This could represent pneumonia or pulmonary edema. Aortic atherosclerosis. Electronically Signed   By: Lorriane Shire M.D.   On: 05/12/2017 16:12    Procedures Procedures (including critical care time)  Medications Ordered in ED Medications  0.9 %  sodium chloride infusion ( Intravenous Hold 05/12/17 1530)  albuterol (PROVENTIL) (2.5 MG/3ML) 0.083% nebulizer solution 5 mg (5 mg Nebulization Given 05/12/17 1530)  ipratropium (ATROVENT) nebulizer solution 0.5 mg (0.5 mg Nebulization Given 05/12/17  1530)     Initial Impression / Assessment and Plan / ED Course  I have reviewed the triage vital signs and the nursing notes.  Pertinent labs & imaging results that were available during my care of the patient were reviewed by me and considered in my medical decision making (see chart for details).   Continuous pulse ox and monitor. Stat labs. Ecg.   Iv ns. Labs.  Cxr. Albuterol and atrovent neb.  Reviewed nursing notes and prior charts for additional history.   Pulse ox on 3 lit 85%, o2 increased. Additional albuterol and atrovent neb. Solumedrol iv.  Continuous neb over 1 hour.  Rales bases, lasix iv.  Pt is noted w cough, now notes subj fever.  bp soft, 90's. Cultures and additional labs ordered re possible pna. Iv abx.  Persistent resp distress/wheezing/increased wob.   Hospitalists consulted for admission.  CRITICAL CARE  RE acute on chronic respiratory failure, dyspnea, increased o2 requirement/nrb, continuous neb txs,  Performed by: Mirna Mires Total critical care time: 40 minutes Critical care time was exclusive of separately billable procedures and treating other patients. Critical care was necessary to treat or prevent imminent or life-threatening deterioration. Critical care was time spent personally by me on the following activities: development of treatment plan with patient and/or surrogate as well as nursing, discussions with consultants, evaluation of patient's response to treatment, examination of patient, obtaining history from patient or surrogate, ordering and performing treatments and interventions, ordering and review of laboratory studies, ordering and review of radiographic studies, pulse oximetry and re-evaluation of patient's condition.   Final Clinical Impressions(s) / ED Diagnoses   Final diagnoses:  None    New Prescriptions New Prescriptions   No medications on file     Lajean Saver, MD 05/12/17 [phone]

## 2017-05-12 NOTE — ED Notes (Signed)
Pt sats 85% on 5L [place]. Placed nonrebreather on pt- sats now 98%

## 2017-05-13 ENCOUNTER — Inpatient Hospital Stay (HOSPITAL_COMMUNITY): Payer: Medicare Other

## 2017-05-13 DIAGNOSIS — J9621 Acute and chronic respiratory failure with hypoxia: Secondary | ICD-10-CM

## 2017-05-13 LAB — CBC WITH DIFFERENTIAL/PLATELET
BASOS PCT: 0 %
Basophils Absolute: 0 10*3/uL (ref 0.0–0.1)
EOS PCT: 0 %
Eosinophils Absolute: 0 10*3/uL (ref 0.0–0.7)
HEMATOCRIT: 35.5 % — AB (ref 36.0–46.0)
Hemoglobin: 10.7 g/dL — ABNORMAL LOW (ref 12.0–15.0)
LYMPHS PCT: 10 %
Lymphs Abs: 0.8 10*3/uL (ref 0.7–4.0)
MCH: 30.6 pg (ref 26.0–34.0)
MCHC: 30.1 g/dL (ref 30.0–36.0)
MCV: 101.4 fL — ABNORMAL HIGH (ref 78.0–100.0)
MONO ABS: 0.3 10*3/uL (ref 0.1–1.0)
Monocytes Relative: 3 %
NEUTROS ABS: 7.6 10*3/uL (ref 1.7–7.7)
Neutrophils Relative %: 87 %
PLATELETS: 380 10*3/uL (ref 150–400)
RBC: 3.5 MIL/uL — AB (ref 3.87–5.11)
RDW: 14.7 % (ref 11.5–15.5)
WBC: 8.7 10*3/uL (ref 4.0–10.5)

## 2017-05-13 LAB — CBC
HEMATOCRIT: 35.2 % — AB (ref 36.0–46.0)
HEMOGLOBIN: 10.4 g/dL — AB (ref 12.0–15.0)
MCH: 29.9 pg (ref 26.0–34.0)
MCHC: 29.5 g/dL — ABNORMAL LOW (ref 30.0–36.0)
MCV: 101.1 fL — AB (ref 78.0–100.0)
Platelets: 318 10*3/uL (ref 150–400)
RBC: 3.48 MIL/uL — AB (ref 3.87–5.11)
RDW: 14.4 % (ref 11.5–15.5)
WBC: 5.1 10*3/uL (ref 4.0–10.5)

## 2017-05-13 LAB — RESPIRATORY PANEL BY PCR
ADENOVIRUS-RVPPCR: NOT DETECTED
Bordetella pertussis: NOT DETECTED
CHLAMYDOPHILA PNEUMONIAE-RVPPCR: NOT DETECTED
CORONAVIRUS NL63-RVPPCR: NOT DETECTED
CORONAVIRUS OC43-RVPPCR: NOT DETECTED
Coronavirus 229E: NOT DETECTED
Coronavirus HKU1: NOT DETECTED
INFLUENZA A-RVPPCR: NOT DETECTED
Influenza B: NOT DETECTED
MYCOPLASMA PNEUMONIAE-RVPPCR: NOT DETECTED
Metapneumovirus: NOT DETECTED
PARAINFLUENZA VIRUS 1-RVPPCR: NOT DETECTED
PARAINFLUENZA VIRUS 4-RVPPCR: NOT DETECTED
Parainfluenza Virus 2: NOT DETECTED
Parainfluenza Virus 3: NOT DETECTED
Respiratory Syncytial Virus: NOT DETECTED
Rhinovirus / Enterovirus: NOT DETECTED

## 2017-05-13 LAB — BASIC METABOLIC PANEL
Anion gap: 9 (ref 5–15)
BUN: 9 mg/dL (ref 6–20)
CHLORIDE: 103 mmol/L (ref 101–111)
CO2: 30 mmol/L (ref 22–32)
Calcium: 8.6 mg/dL — ABNORMAL LOW (ref 8.9–10.3)
Creatinine, Ser: 0.53 mg/dL (ref 0.44–1.00)
GFR calc non Af Amer: >60 mL/min (ref >60)
Glucose, Bld: 220 mg/dL — ABNORMAL HIGH (ref 65–99)
POTASSIUM: 4.6 mmol/L (ref 3.5–5.1)
SODIUM: 142 mmol/L (ref 135–145)

## 2017-05-13 LAB — GLUCOSE, CAPILLARY
GLUCOSE-CAPILLARY: 177 mg/dL — AB (ref 65–99)
GLUCOSE-CAPILLARY: 183 mg/dL — AB (ref 65–99)
GLUCOSE-CAPILLARY: 169 mg/dL — AB (ref 65–99)
GLUCOSE-CAPILLARY: 155 mg/dL — AB (ref 65–99)
Glucose-Capillary: 173 mg/dL — ABNORMAL HIGH (ref 65–99)

## 2017-05-13 MED ORDER — ARFORMOTEROL TARTRATE 15 MCG/2ML IN NEBU
15.0000 ug | INHALATION_SOLUTION | Freq: Two times a day (BID) | RESPIRATORY_TRACT | Status: DC
  Administered 2017-05-13 – 2017-05-16 (×6): 15 ug via RESPIRATORY_TRACT
  Filled 2017-05-13 (×7): qty 2

## 2017-05-13 MED ORDER — IPRATROPIUM BROMIDE 0.02 % IN SOLN
0.5000 mg | Freq: Four times a day (QID) | RESPIRATORY_TRACT | Status: DC
  Filled 2017-05-13: qty 2.5

## 2017-05-13 MED ORDER — AZITHROMYCIN 250 MG PO TABS
500.0000 mg | ORAL_TABLET | Freq: Every day | ORAL | Status: DC
  Administered 2017-05-13 – 2017-05-16 (×4): 500 mg via ORAL
  Filled 2017-05-13 (×4): qty 2

## 2017-05-13 MED ORDER — FUROSEMIDE 10 MG/ML IJ SOLN
40.0000 mg | Freq: Once | INTRAMUSCULAR | Status: AC
  Administered 2017-05-13: 40 mg via INTRAVENOUS
  Filled 2017-05-13: qty 4

## 2017-05-13 NOTE — Progress Notes (Signed)
Applied purwick for pt to assist in monitoring I/O and to keep pt skin dry.

## 2017-05-13 NOTE — Progress Notes (Signed)
Pt very weak when stood for orthostatic vital signs, ended up using [place] for her.

## 2017-05-13 NOTE — Progress Notes (Signed)
PROGRESS NOTE    Dawn Montoya  VQX:[id number] DOB: 05/21/1932 DOA: 05/12/2017 PCP: Hendricks Limes, MD  Outpatient Specialists:     Brief Narrative:   80 ? [place] resident GOLD "D" COPD on 3l/m O2 Diastolic HF EF 00-34% 91/7915 DM on insulin at home with nephropathy/neuropathy Hypothyroid stg III CKD bipolar Body mass index is 44.95 kg/m.  Recent admit 03/2017 acute resp failure and met encephalopathy-initally required Bipap Rx fullw for that visit with abx  apprently was Rx for PNA po levaquin 250 at [facility] over 2/2 to 2.2 lb weight gain --was increased on lasix 40-60  Came t ED as wprsening-O2 sat 85%--cont neb, no respojnse BP 90 syst  bnp 158, no whote count  admitted with multifactorial dyspnea? 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  Chest x-ray from this morning 721 shows only fluid  Will change IV vancomycin and cefepime 2 azithromycin for COPD coverage  Continue Solu-Medrol 40 every 12 as still wheezy  Continue albuterol neb every 6 when necessary, Pulmicort 0.25 twice a day, ipratropium 0.5 every 6  We will add Brovana as well  Continue Lasix 60 mg daily  Respiratory viral panel is pending  Diabetes mellitus type 2 with nephropathy/neuropathy  Continue Lantus 12 units at bedtime, continue sliding scale coverage  Cymbalta for neuropathy/depression-60 mg daily  Continue gabapentin 300 3 times a day  Metformin PTA 500 twice a day on hold  Stage III CK D  Creatinine is actually  normal?  Bipolar  Cymbalta as above, trazodone 50 at bedtime  Diastolic heart failure Blood pressure  Continue Coreg 3.125 ii/day  Continue valsartan 20 daily  Blood pressure reasonably controlle  ? Visual hallucinations ? Dizziness secondary to orthostasis  She states she has these only when she wakes at  She seems somewhat oriented although is a little bit forgetful  We will continue to monitor I do not think she needs any psychotropics at this time    Consultants:   none  Procedures:   none  Antimicrobials:   Vancomycin and Rocephin 7/21 ---  Azithromycin 7/21   Subjective: Alert pleasant oriented No distress--still wheezy somewhat Eating and drinking--doesn't like the food No nausea no chest pain no unilateral weakness States that she"sees things" when she awakens initially--seems to be related to her position  Objective: Vitals:   05/13/17 0559 05/13/17 0700 05/13/17 0724 05/13/17 0727  BP: (!) 121/52     Pulse: 71     Resp: 18     Temp: 98.4 F (36.9 C)     TempSrc: Oral     SpO2: 93%  91% 93%  Weight:  115.1 kg (253 lb 12 oz)    Height:  _0  (1.6 m)      Intake/Output Summary (Last 24 hours) at 05/13/17 0844 Last data filed at 05/13/17 0602  Gross per 24 hour  Intake              120 ml  Output  0 ml  Net              120 ml   Filed Weights   05/13/17 0700  Weight: 115.1 kg (253 lb 12 oz)    Examination:  Pleasant obese oriented Mallampati 3 Chest wheezy bilateral posteriorly S1-S2 M/R/G ABd soft nt nd Neuro intact msk intact  Data Reviewed: I have personally reviewed following labs and imaging studies  CBC:  Recent Labs Lab 05/10/17 05/12/17 1541 05/13/17 0402  WBC 7.6 9.4 5.1  NEUTROABS 5  --   --   HGB 10.9* 10.8* 10.4*  HCT 33* 36.4 35.2*  MCV  --  104.0* 101.1*  PLT 275 349 078   Basic Metabolic Panel:  Recent Labs Lab 05/10/17 05/12/17 1541 05/13/17 0402  NA 145 142 142  K 4.6 4.2 4.6  CL   --  97* 103  CO2  --  35* 30  GLUCOSE  --  135* 220*  BUN _0 CREATININE 0.6 0.65 0.53  CALCIUM  --  8.9 8.6*   GFR: Estimated Creatinine Clearance: 65.2 mL/min (by C-G formula based on SCr of 0.53 mg/dL). Liver Function Tests: No results for input(s): AST, ALT, ALKPHOS, BILITOT, PROT, ALBUMIN in the last 168 hours. No results for input(s): LIPASE, AMYLASE in the last 168 hours. No results for input(s): AMMONIA in the last 168 hours. Coagulation Profile: No results for input(s): INR, PROTIME in the last 168 hours. Cardiac Enzymes: No results for input(s): CKTOTAL, CKMB, CKMBINDEX, TROPONINI in the last 168 hours. BNP (last 3 results)  Recent Labs  04/12/17 1136  PROBNP 153.0*   HbA1C: No results for input(s): HGBA1C in the last 72 hours. CBG:  Recent Labs Lab 05/12/17 2157 05/13/17 0722 05/13/17 0746  GLUCAP 226* 173* 177*   Lipid Profile: No results for input(s): CHOL, HDL, LDLCALC, TRIG, CHOLHDL, LDLDIRECT in the last 72 hours. Thyroid Function Tests: No results for input(s): TSH, T4TOTAL, FREET4, T3FREE, THYROIDAB in the last 72 hours. Anemia Panel: No results for input(s): VITAMINB12, FOLATE, FERRITIN, TIBC, IRON, RETICCTPCT in the last 72 hours. Urine analysis:    Component Value Date/Time   COLORURINE AMBER (A) 04/05/2017 0002   APPEARANCEUR HAZY (A) 04/05/2017 0002   LABSPEC 1.032 (H) 04/05/2017 0002   PHURINE 5.0 04/05/2017 0002   GLUCOSEU 50 (A) 04/05/2017 0002   HGBUR NEGATIVE 04/05/2017 0002   BILIRUBINUR NEGATIVE 04/05/2017 0002   KETONESUR 80 (A) 04/05/2017 0002   PROTEINUR 30 (A) 04/05/2017 0002   UROBILINOGEN 1.0 10/24/2014 2147   NITRITE NEGATIVE 04/05/2017 0002   LEUKOCYTESUR NEGATIVE 04/05/2017 0002   Sepsis Labs: _1 (procalcitonin:4,lacticidven:4)  )No results found for this or any previous visit (from the past 240 hour(s)).       Radiology Studies: Dg Chest Port 1 View  Result Date: 05/12/2017 CLINICAL DATA:   Shortness of breath.  Wheezing. EXAM: PORTABLE CHEST 1 VIEW COMPARISON:  04/12/2017 FINDINGS: There is haziness at the lung bases, right greater than left. This could represent pneumonia or pulmonary edema. The heart size and pulmonary vascularity appear normal. Calcification in the arch of the aorta. No acute bone abnormality. IMPRESSION: Hazy infiltrates at the lung bases, right more than left. This could represent pneumonia or pulmonary edema. Aortic atherosclerosis. Electronically Signed   By: Lorriane Shire M.D.   On: 05/12/2017 16:12        Scheduled Meds: . albuterol  5 mg Nebulization Once  . aspirin  81 mg Oral Daily  . benzocaine  1 application Mouth/Throat TID AC  . benzonatate  100 mg Oral TID  . budesonide  0.25 mg Nebulization BID  . carvedilol  3.125 mg Oral BID AC  . cholecalciferol  1,000 Units Oral Daily  . cycloSPORINE  1 drop Both Eyes BID  . DULoxetine  60 mg Oral Daily  . furosemide  60 mg Oral Daily  . gabapentin  300 mg Oral TID  . guaiFENesin   Oral BID  . heparin  5,000 Units Subcutaneous Q8H  . insulin aspart  0-15 Units Subcutaneous TID WC  . insulin glargine  10 Units Subcutaneous QHS  . insulin glargine  10 Units Subcutaneous QHS  . ipratropium-albuterol  3 mL Nebulization Q6H  . levothyroxine  75 mcg Oral QAC breakfast  . Melatonin  3 mg Oral QHS  . methylPREDNISolone (SOLU-MEDROL) injection  40 mg Intravenous Q12H  . pantoprazole  40 mg Oral Q0600  . polyvinyl alcohol  1 drop Both Eyes BID  . potassium chloride  10 mEq Oral Daily  . saccharomyces boulardii  250 mg Oral BID  . traZODone  50 mg Oral QHS   Continuous Infusions: . sodium chloride Stopped (05/12/17 1530)  . ceFEPime (MAXIPIME) IV 1 g (05/13/17 0141)  . vancomycin    . vancomycin       LOS: 1 day    Time spent: [place], MD [facility]   If 7PM-7AM, please contact night-coverage [email or web address] Password [facility] 05/13/2017, 8:44 AM

## 2017-05-14 LAB — COMPREHENSIVE METABOLIC PANEL
ALT: 9 U/L — ABNORMAL LOW (ref 14–54)
AST: 14 U/L — ABNORMAL LOW (ref 15–41)
Albumin: 2.8 g/dL — ABNORMAL LOW (ref 3.5–5.0)
Alkaline Phosphatase: 46 U/L (ref 38–126)
Anion gap: 8 (ref 5–15)
BUN: 16 mg/dL (ref 6–20)
CHLORIDE: 99 mmol/L — AB (ref 101–111)
CO2: 33 mmol/L — ABNORMAL HIGH (ref 22–32)
Calcium: 8.6 mg/dL — ABNORMAL LOW (ref 8.9–10.3)
Creatinine, Ser: 0.72 mg/dL (ref 0.44–1.00)
GFR calc non Af Amer: >60 mL/min (ref >60)
Glucose, Bld: 177 mg/dL — ABNORMAL HIGH (ref 65–99)
POTASSIUM: 4.5 mmol/L (ref 3.5–5.1)
Sodium: 140 mmol/L (ref 135–145)
TOTAL PROTEIN: 5.5 g/dL — AB (ref 6.5–8.1)
Total Bilirubin: 0.6 mg/dL (ref 0.3–1.2)

## 2017-05-14 LAB — GLUCOSE, CAPILLARY
GLUCOSE-CAPILLARY: 180 mg/dL — AB (ref 65–99)
GLUCOSE-CAPILLARY: 218 mg/dL — AB (ref 65–99)
GLUCOSE-CAPILLARY: 186 mg/dL — AB (ref 65–99)
Glucose-Capillary: 236 mg/dL — ABNORMAL HIGH (ref 65–99)

## 2017-05-14 MED ORDER — PREDNISONE 50 MG PO TABS
60.0000 mg | ORAL_TABLET | Freq: Every day | ORAL | Status: DC
  Administered 2017-05-14 – 2017-05-16 (×3): 60 mg via ORAL
  Filled 2017-05-14 (×3): qty 1

## 2017-05-14 MED ORDER — FUROSEMIDE 10 MG/ML IJ SOLN
40.0000 mg | Freq: Two times a day (BID) | INTRAMUSCULAR | Status: DC
  Administered 2017-05-14 – 2017-05-16 (×4): 40 mg via INTRAVENOUS
  Filled 2017-05-14 (×4): qty 4

## 2017-05-14 NOTE — Progress Notes (Signed)
PROGRESS NOTE    Dawn Montoya  BBC:[id number] DOB: 1932/01/30 DOA: 05/12/2017 PCP: Hendricks Limes, MD  Outpatient Specialists:     Brief Narrative:   78 ? [facility] resident GOLD "D" COPD on 3l/m O2 Diastolic HF EF 50-38% 88/2800 DM on insulin at home with nephropathy/neuropathy Hypothyroid stg III CKD bipolar Body mass index is 44.95 kg/m.  Recent admit 03/2017 acute resp failure and met encephalopathy-initally required Bipap Rx fullw for that visit with abx  apprently was Rx for PNA po levaquin 250 at [facility] over 2/2 to 2.2 lb weight gain --was increased on lasix 40-60 as OP  Came to ED as worsening sat 85%--cont neb, no response BP 90 syst  bnp 158, no whote count  admitted with multifactorial dyspnea? 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  Chest x-ray from 721 shows only fluid  Will change IV vancomycin and cefepime 2 azithromycin for COPD coverage  Solu-Medrol 40 q12 changed to prednisone 60--has imporved her AECOP  Continue albuterol neb every 6 when necessary, Pulmicort 0.25 twice a day, ipratropium 0.5 every 6  We will add Brovana as well  change Lasix oral 60 mg daily, I/o only -500cc, increase to 40 iv bid 7/22  Respiratory viral panel neg-d/c respiratory precaution  Diabetes mellitus type 2 with nephropathy/neuropathy  Continue Lantus 12 units at bedtime, continue sliding scale coverage--sugars 177-180  Cymbalta for neuropathy/depression-60 mg daily  Continue gabapentin 300 3  times a day  Metformin PTA 500 twice a day on hold  Stage III CK D-stable  Creatinine wnl  OHSS habitus  Not on CPAP?  Get nocturnal sat   Bipolar-stable  Cymbalta as above, trazodone 50 at bedtime  Diastolic heart failure-stable Blood pressure  Continue Coreg 3.125 ii/day  Continue valsartan 20 daily  Blood pressure well ctrll  Morbid obesity, Body mass index is 44.95 kg/m.  OP bariatrics?  ? Visual hallucinations ? Dizziness secondary to orthostasis  Sounds like ocular issue and will need OP optho folow up  She seems somewhat oriented although is a little bit forgetful   Called granddaughter on cell no answer Inpatient pending resolution Med surg   Consultants:   none  Procedures:   none  Antimicrobials:   Vancomycin and Rocephin 7/21 ---  Azithromycin 7/21  Subjective:  Alert pleasant in nad Coughing some Feels warm to touch, but recheck afebrile Eating fair No diarr No abd pain Slight HOH and slight confusion-but then can tell me [place] is from [place] and moved here 3 yrs ago  Objective: Vitals:   05/14/17 0645 05/14/17 0913 05/14/17 0914 05/14/17 0915  BP: 114/69     Pulse: (!) 57     Resp: 16     Temp: 98.4 F (36.9 C)     TempSrc:      SpO2: 92% (!) 89% 90% 90%  Weight:      Height:        Intake/Output Summary (Last 24 hours) at 05/14/17 1209 Last data filed at 05/14/17 3491  Gross per 24 hour  Intake  500 ml  Output             1000 ml  Net             -500 ml   Filed Weights   05/13/17 0700  Weight: 115.1 kg (253 lb 12 oz)    Examination:  Obese pleasant mallampati 4 Mod dentition Cannot appreciate JVD 2/2 habitus s1 s2 no m/r/g abd obese Mild LE edema Rales and wheezy throughout posteriorly  Data Reviewed: I have personally reviewed following labs and imaging studies  CBC:  Recent Labs Lab 05/10/17 05/12/17 1541 05/13/17 0402 05/13/17 1430  WBC 7.6 9.4 5.1 8.7  NEUTROABS 5  --   --  7.6  HGB  10.9* 10.8* 10.4* 10.7*  HCT 33* 36.4 35.2* 35.5*  MCV  --  104.0* 101.1* 101.4*  PLT 275 349 318 497   Basic Metabolic Panel:  Recent Labs Lab 05/10/17 05/12/17 1541 05/13/17 0402 05/14/17 0543  NA 145 142 142 140  K 4.6 4.2 4.6 4.5  CL  --  97* 103 99*  CO2  --  35* 30 33*  GLUCOSE  --  135* 220* 177*  BUN _0 CREATININE 0.6 0.65 0.53 0.72  CALCIUM  --  8.9 8.6* 8.6*   GFR: Estimated Creatinine Clearance: 65.2 mL/min (by C-G formula based on SCr of 0.72 mg/dL). Liver Function Tests:  Recent Labs Lab 05/14/17 0543  AST 14*  ALT 9*  ALKPHOS 46  BILITOT 0.6  PROT 5.5*  ALBUMIN 2.8*   No results for input(s): LIPASE, AMYLASE in the last 168 hours. No results for input(s): AMMONIA in the last 168 hours. Coagulation Profile: No results for input(s): INR, PROTIME in the last 168 hours. Cardiac Enzymes: No results for input(s): CKTOTAL, CKMB, CKMBINDEX, TROPONINI in the last 168 hours. BNP (last 3 results)  Recent Labs  04/12/17 1136  PROBNP 153.0*   HbA1C: No results for input(s): HGBA1C in the last 72 hours. CBG:  Recent Labs Lab 05/13/17 0746 05/13/17 1200 05/13/17 1750 05/13/17 2138 05/14/17 0734  GLUCAP 177* 183* 169* 155* 180*   Lipid Profile: No results for input(s): CHOL, HDL, LDLCALC, TRIG, CHOLHDL, LDLDIRECT in the last 72 hours. Thyroid Function Tests: No results for input(s): TSH, T4TOTAL, FREET4, T3FREE, THYROIDAB in the last 72 hours. Anemia Panel: No results for input(s): VITAMINB12, FOLATE, FERRITIN, TIBC, IRON, RETICCTPCT in the last 72 hours. Urine analysis:    Component Value Date/Time   COLORURINE AMBER (A) 04/05/2017 0002   APPEARANCEUR HAZY (A) 04/05/2017 0002   LABSPEC 1.032 (H) 04/05/2017 0002   PHURINE 5.0 04/05/2017 0002   GLUCOSEU 50 (A) 04/05/2017 0002   HGBUR NEGATIVE 04/05/2017 0002   BILIRUBINUR NEGATIVE 04/05/2017 0002   KETONESUR 80 (A) 04/05/2017 0002   PROTEINUR 30 (A) 04/05/2017 0002   UROBILINOGEN 1.0  10/24/2014 2147   NITRITE NEGATIVE 04/05/2017 0002   LEUKOCYTESUR NEGATIVE 04/05/2017 0002      Radiology Studies: Dg Chest 2 View  Result Date: 05/13/2017 CLINICAL DATA:  Shortness breath, cough, chest pain on LEFT radiating to LEFT shoulder yesterday, hypertension, CHF, former smoker, breast cancer EXAM: CHEST  2 VIEW COMPARISON:  05/12/2017 FINDINGS: Enlargement of cardiac silhouette. Atherosclerotic calcification aorta. Azygos fissure noted. Pulmonary vascularity normal. Bronchitic changes with hazy infiltrates in the perihilar to basilar regions bilaterally favor pulmonary edema over infection. Bibasilar pleural effusions. Upper lungs clear. No pneumothorax. Bones demineralized. IMPRESSION: Mild BILATERAL mid to basilar infiltrates question pulmonary edema less likely  infection. Bibasilar pleural effusions. Aortic Atherosclerosis (ICD10-I70.0). Electronically Signed   By: Lavonia Dana M.D.   On: 05/13/2017 09:42   Dg Chest Port 1 View  Result Date: 05/12/2017 CLINICAL DATA:  Shortness of breath.  Wheezing. EXAM: PORTABLE CHEST 1 VIEW COMPARISON:  04/12/2017 FINDINGS: There is haziness at the lung bases, right greater than left. This could represent pneumonia or pulmonary edema. The heart size and pulmonary vascularity appear normal. Calcification in the arch of the aorta. No acute bone abnormality. IMPRESSION: Hazy infiltrates at the lung bases, right more than left. This could represent pneumonia or pulmonary edema. Aortic atherosclerosis. Electronically Signed   By: Lorriane Shire M.D.   On: 05/12/2017 16:12    Scheduled Meds: . albuterol  5 mg Nebulization Once  . arformoterol  15 mcg Nebulization BID  . aspirin  81 mg Oral Daily  . azithromycin  500 mg Oral Daily  . benzocaine  1 application Mouth/Throat TID AC  . benzonatate  100 mg Oral TID  . budesonide  0.25 mg Nebulization BID  . carvedilol  3.125 mg Oral BID AC  . cholecalciferol  1,000 Units Oral Daily  . cycloSPORINE  1  drop Both Eyes BID  . DULoxetine  60 mg Oral Daily  . furosemide  60 mg Oral Daily  . gabapentin  300 mg Oral TID  . guaiFENesin   Oral BID  . heparin  5,000 Units Subcutaneous Q8H  . insulin aspart  0-15 Units Subcutaneous TID WC  . insulin glargine  10 Units Subcutaneous QHS  . insulin glargine  10 Units Subcutaneous QHS  . ipratropium-albuterol  3 mL Nebulization Q6H  . levothyroxine  75 mcg Oral QAC breakfast  . Melatonin  3 mg Oral QHS  . methylPREDNISolone (SOLU-MEDROL) injection  40 mg Intravenous Q12H  . pantoprazole  40 mg Oral Q0600  . polyvinyl alcohol  1 drop Both Eyes BID  . potassium chloride  10 mEq Oral Daily  . saccharomyces boulardii  250 mg Oral BID  . traZODone  50 mg Oral QHS   Continuous Infusions: . sodium chloride Stopped (05/12/17 1530)     LOS: 2 days    Time spent: [place], MD [facility]   If 7PM-7AM, please contact night-coverage [email or web address] Password [facility] 05/14/2017, 12:09 PM

## 2017-05-15 ENCOUNTER — Inpatient Hospital Stay (HOSPITAL_COMMUNITY): Payer: Medicare Other

## 2017-05-15 LAB — BASIC METABOLIC PANEL
Anion gap: 9 (ref 5–15)
BUN: 20 mg/dL (ref 6–20)
CO2: 37 mmol/L — ABNORMAL HIGH (ref 22–32)
CREATININE: 0.8 mg/dL (ref 0.44–1.00)
Calcium: 8.8 mg/dL — ABNORMAL LOW (ref 8.9–10.3)
Chloride: 93 mmol/L — ABNORMAL LOW (ref 101–111)
GFR calc Af Amer: >60 mL/min (ref >60)
Glucose, Bld: 244 mg/dL — ABNORMAL HIGH (ref 65–99)
POTASSIUM: 4.3 mmol/L (ref 3.5–5.1)
Sodium: 139 mmol/L (ref 135–145)

## 2017-05-15 LAB — GLUCOSE, CAPILLARY
GLUCOSE-CAPILLARY: 178 mg/dL — AB (ref 65–99)
GLUCOSE-CAPILLARY: 245 mg/dL — AB (ref 65–99)
GLUCOSE-CAPILLARY: 232 mg/dL — AB (ref 65–99)
Glucose-Capillary: 209 mg/dL — ABNORMAL HIGH (ref 65–99)

## 2017-05-15 NOTE — Evaluation (Signed)
Physical Therapy Evaluation Patient Details Name: Dawn Montoya MRN: [id number] DOB: Mar 13, 1932 Today's Date: 05/15/2017   History of Present Illness  83 yo female with onset of acute respiratory failure and hypoxemia after admission from SNF.  Became SOB, had CHF and was rooming with a sick pt in SNF.  PMHx: COPD, CHF,  respiratory failure,  CKD, depression, and HTN.  Also cervical spondylosis with UE myelopathy, PN  Clinical Impression  Pt is up to walk with PT today only for a short trip as she dropped from 91% to 88% O2 sat even on O2.  Had her sit up with propped posture in the chair to support O2 exchange.  Pt was comfortable today despite her sat challenges, and will be able to call nursing to get back to bed as PT has educated nursing to use lift vs 2 person assist.  Follow acutely until SNF return for strength of LE's and standing endurance.  Pt will progress to gait on RW as tolerated to increase her control of options for home vs LTC.    Follow Up Recommendations SNF    Equipment Recommendations  None recommended by PT    Recommendations for Other Services       Precautions / Restrictions Precautions Precautions: Fall (TELEMETRY) Restrictions Weight Bearing Restrictions: No      Mobility  Bed Mobility Overal bed mobility: Modified Independent             General bed mobility comments: cues for hand placement to stand effectively  Transfers Overall transfer level: Needs assistance Equipment used: Rolling walker (2 wheeled);1 person hand held assist Transfers: Sit to/from Stand Sit to Stand: Min assist         General transfer comment: cues for safety with hand placement  Ambulation/Gait Ambulation/Gait assistance: Min assist Ambulation Distance (Feet): 5 Feet Assistive device: Rolling walker (2 wheeled);1 person hand held assist Gait Pattern/deviations: Step-to pattern;Shuffle;Decreased stride length;Wide base of support;Trunk flexed Gait velocity:  reduced Gait velocity interpretation: Below normal speed for age/gender    Stairs            Wheelchair Mobility    Modified Rankin (Stroke Patients Only)       Balance Overall balance assessment: Needs assistance Sitting-balance support: Feet supported Sitting balance-Leahy Scale: Fair     Standing balance support: Bilateral upper extremity supported Standing balance-Leahy Scale: Poor                               Pertinent Vitals/Pain Pain Assessment: Faces Faces Pain Scale: Hurts little more Pain Location: L hand with contact with IV Pain Descriptors / Indicators: Operative site guarding Pain Intervention(s): Limited activity within patient's tolerance;Monitored during session;Premedicated before session;Repositioned    Home Living Family/patient expects to be discharged to:: Skilled nursing facility                      Prior Function Level of Independence: Needs assistance   Gait / Transfers Assistance Needed: Has used RW most recently in SNF with short gait distances.  ADL's / Homemaking Assistance Needed: Pt requires assistance with bathing and self care        Hand Dominance   Dominant Hand: Right    Extremity/Trunk Assessment   Upper Extremity Assessment Upper Extremity Assessment: Overall WFL for tasks assessed    Lower Extremity Assessment Lower Extremity Assessment: Generalized weakness    Cervical / Trunk Assessment Cervical / Trunk  Assessment: Normal  Communication   Communication: No difficulties  Cognition Arousal/Alertness: Awake/alert Behavior During Therapy: Anxious Overall Cognitive Status: Within Functional Limits for tasks assessed                                        General Comments General comments (skin integrity, edema, etc.): Pt is up to walk with minor help on walker and takes short steps outside walker, flexed posture    Exercises     Assessment/Plan    PT Assessment  Patient needs continued PT services  PT Problem List Decreased strength;Decreased range of motion;Decreased activity tolerance;Decreased balance;Decreased mobility;Decreased coordination;Decreased cognition;Decreased knowledge of use of DME;Decreased safety awareness;Cardiopulmonary status limiting activity;Obesity;Decreased skin integrity;Pain       PT Treatment Interventions DME instruction;Gait training;Functional mobility training;Therapeutic activities;Therapeutic exercise;Balance training;Cognitive remediation;Neuromuscular re-education;Patient/family education    PT Goals (Current goals can be found in the Care Plan section)  Acute Rehab PT Goals Patient Stated Goal: to get stronger and figure out her living situation PT Goal Formulation: With patient Time For Goal Achievement: 05/29/17 Potential to Achieve Goals: Good    Frequency Min 3X/week   Barriers to discharge Decreased caregiver support;Inaccessible home environment going back to SNF but not sure thereafter    Co-evaluation               AM-PAC PT "6 Clicks" Daily Activity  Outcome Measure Difficulty turning over in bed (including adjusting bedclothes, sheets and blankets)?: Total Difficulty moving from lying on back to sitting on the side of the bed? : Total Difficulty sitting down on and standing up from a chair with arms (e.g., wheelchair, bedside commode, etc,.)?: Total Help needed moving to and from a bed to chair (including a wheelchair)?: A Little Help needed walking in hospital room?: A Little Help needed climbing 3-5 steps with a railing? : Total 6 Click Score: 10    End of Session Equipment Utilized During Treatment: Gait belt;Oxygen Activity Tolerance: Patient limited by fatigue;Treatment limited secondary to medical complications (Comment) (O2 sats down to 88% in standing even on 6L O2) Patient left: in chair;with call bell/phone within reach;Other (comment) (nursing approved no chair alarm, spine  support in the chair ) Nurse Communication: Mobility status PT Visit Diagnosis: Unsteadiness on feet (R26.81);Difficulty in walking, not elsewhere classified (R26.2)    Time: 1535-1601 PT Time Calculation (min) (ACUTE ONLY): 26 min   Charges:   PT Evaluation $PT Eval Moderate Complexity: 1 Procedure PT Treatments $Therapeutic Activity: 8-22 mins   PT G Codes:   PT G-Codes **NOT FOR INPATIENT CLASS** Functional Assessment Tool Used: AM-PAC 6 Clicks Basic Mobility    Ramond Dial 05/15/2017, 4:39 PM   Mee Hives, PT MS Acute Rehab Dept. Number: [place]

## 2017-05-15 NOTE — Clinical Social Work Note (Signed)
CSW met with pt at bedside. Pt from [facility] as LTC pt and agreeable to return there upon discharge. P/T recommending SNF. [facility] confirmed pt's bed is on hold. CSW continuing to follow for discharge needs.   Oretha Ellis, [place], [place] Work [phone]

## 2017-05-15 NOTE — Progress Notes (Signed)
Inpatient Diabetes Program Recommendations  AACE/ADA: New Consensus Statement on Inpatient Glycemic Control (2015)  Target Ranges:  Prepandial:   less than 140 mg/dL      Peak postprandial:   less than 180 mg/dL (1-2 hours)      Critically ill patients:  140 - 180 mg/dL   Results for Dawn Montoya, Dawn Montoya (MRN [id number]) as of 05/15/2017 10:14  Ref. Range 05/14/2017 07:34 05/14/2017 12:10 05/14/2017 17:06 05/14/2017 21:41 05/15/2017 07:59  Glucose-Capillary Latest Ref Range: 65 - 99 mg/dL 180 (H) 218 (H) 236 (H) 186 (H) 178 (H)   Review of Glycemic Control  Diabetes history: DM2 Outpatient Diabetes medications: Lantus 12 units QHS, Metformin 500 mg BID Current orders for Inpatient glycemic control: Lantus 10 units QHS, Novolog 0-15 units TID with meals  Inpatient Diabetes Program Recommendations: Correction (SSI): Please consider ordering Novolog 0-5 units QHS for bedtime correction. Insulin - Meal Coverage: If steroids are continued, please consider ordering Novolog 3 units TID with meals for meal coverage if patient eats at least 50% of meals.  Thanks, Barnie Alderman, RN, MSN, CDE Diabetes Coordinator Inpatient Diabetes Program [phone] (Team Pager from 8am to 5pm)

## 2017-05-15 NOTE — Progress Notes (Signed)
PROGRESS NOTE    Dawn Montoya  UEK:[id number] DOB: July 31, 1932 DOA: 05/12/2017 PCP: Hendricks Limes, MD  Outpatient Specialists:     Brief Narrative:   16 ? [place] resident GOLD "D" COPD on 3l/m O2 Diastolic HF EF 15-05% 69/7948 DM on insulin at home with nephropathy/neuropathy Hypothyroid stg III CKD bipolar Body mass index is 44.95 kg/m.  Recent admit 03/2017 acute resp failure and met encephalopathy-initally required Bipap Rx fullw for that visit with abx  apprently was Rx for PNA po levaquin 250 at [facility] over 2/2 to 2.2 lb weight gain --was increased on lasix 40-60 as OP  Came to ED as worsening sat 85%--cont neb, no response BP 90 syst  bnp 158, no whote count  admitted with multifactorial dyspnea? Pneumonia   Assessment & Plan:   Active Problems:   COPD (chronic obstructive pulmonary disease) (HCC)   Acute on chronic respiratory failure with hypoxemia (HCC)   CKD (chronic kidney disease) stage 3, GFR 30-59 ml/min   GERD without esophagitis   COPD exacerbation (HCC)   DM (diabetes mellitus), secondary, uncontrolled, with peripheral vascular complications (HCC)   COPD with acute exacerbation (HCC)  MMRC grD 3-4 MULTIFACT DYSPNEA Likely multifactorial AE COPD No pneumonia-stopped broad-spectrum antibiotics on admission Respiratory viral panel neg-d/c respiratory precaution This admission likely more a contribution from heart failure  Chest x-ray from 721 shows only fluid   azithromycin for COPD coverage  Solu-Medrol 40 q12 changed to prednisone 60--her breathing has improved  Continue albuterol neb every 6 when necessary, Pulmicort 0.25 twice a day, ipratropium 0.5 every 6--continue Brovana as well which was added this admission  change Lasix oral 60 mg daily, I/o only -500cc, increase to 40 iv bid 7/22 with possible change to oral Lasix 7/24   chest x-ray 7/23 shows improving pleural effusions raising suspicion that this was volume  overload  Weight on last discharge was 248, on this admission was 253-have asked for standing weights and strict in and out  Diabetes mellitus type 2 with nephropathy/neuropathy  Continue Lantus 12 units at bedtime, continue sliding scale coverage--sugars 178-244  Cymbalta for neuropathy/depression-60 mg daily  Continue gabapentin 300 3 times a day  Metformin PTA 500 twice a day on hold  Stage III CK D-stable  Creatinine wnl Watch carefully as diuresis-repeat labs a.m.   OHSS habitus  Not on CPAP?  Get nocturnal sat   Bipolar-stable  Cymbalta as above, trazodone 50 at bedtime  Diastolic heart failure-stable Blood pressure  Continue Coreg 3.125 ii/day  Continue valsartan 20 daily  Blood pressure well ctrll  Morbid obesity, Body mass index is 44.95 kg/m.  OP bariatrics?  ? Visual hallucinations ? Dizziness secondary to orthostasis  Sounds like ocular issue and will need OP optho folow up   Updated patient's granddaughter 7/22  Inpatient pending resolution-Possible as early as 48 hours  Med surg   Consultants:   none  Procedures:   none  Antimicrobials:   Vancomycin and Rocephin 7/21 ---7   Azithromycin 7/21-->7/26  Subjective:  Breathing is improved Does not like her breakfast No lower extremity pain however still feels a little swollen No chest pain Bringing up some sputum Has not had a stool yet   Objective: Vitals:   05/14/17 1418 05/14/17 1455 05/14/17 2210 05/15/17 0555  BP: 122/65  (!) 102/45 130/60  Pulse: (!) 49  78 72  Resp: 18  18 18   Temp: 98.1 F (36.7 C)  98.8 F (37.1 C) 98.3 F (36.8 C)  TempSrc: Oral  Oral   SpO2: 91% 97% 93% 91%  Weight:      Height:        Intake/Output Summary (Last 24 hours) at 05/15/17 0723 Last data filed at 05/15/17 0555  Gross per 24 hour  Intake              880 ml  Output             2850 ml  Net            -1970 ml   Filed Weights   05/13/17 0700  Weight: 115.1 kg (253 lb 12 oz)     Examination:  Obese pleasant mallampati 4 Cannot appreciate JVD with her habitus No bruit S1-S2 no murmur rub or gallop Abdomen obese no rebound cannot assess for organomegaly Grade 1 lower extremity edema, no palpable cord Neurologically intact   Data Reviewed: I have personally reviewed following labs and imaging studies  CBC:  Recent Labs Lab 05/10/17 05/12/17 1541 05/13/17 0402 05/13/17 1430  WBC 7.6 9.4 5.1 8.7  NEUTROABS 5  --   --  7.6  HGB 10.9* 10.8* 10.4* 10.7*  HCT 33* 36.4 35.2* 35.5*  MCV  --  104.0* 101.1* 101.4*  PLT 275 349 318 544   Basic Metabolic Panel:  Recent Labs Lab 05/10/17 05/12/17 1541 05/13/17 0402 05/14/17 0543 05/15/17 0322  NA 145 142 142 140 139  K 4.6 4.2 4.6 4.5 4.3  CL  --  97* 103 99* 93*  CO2  --  35* 30 33* 37*  GLUCOSE  --  135* 220* 177* 244*  BUN 10 9 9 16 20   CREATININE 0.6 0.65 0.53 0.72 0.80  CALCIUM  --  8.9 8.6* 8.6* 8.8*   GFR: Estimated Creatinine Clearance: 65.2 mL/min (by C-G formula based on SCr of 0.8 mg/dL). Liver Function Tests:  Recent Labs Lab 05/14/17 0543  AST 14*  ALT 9*  ALKPHOS 46  BILITOT 0.6  PROT 5.5*  ALBUMIN 2.8*   No results for input(s): LIPASE, AMYLASE in the last 168 hours. No results for input(s): AMMONIA in the last 168 hours. Coagulation Profile: No results for input(s): INR, PROTIME in the last 168 hours. Cardiac Enzymes: No results for input(s): CKTOTAL, CKMB, CKMBINDEX, TROPONINI in the last 168 hours. BNP (last 3 results)  Recent Labs  04/12/17 1136  PROBNP 153.0*   HbA1C: No results for input(s): HGBA1C in the last 72 hours. CBG:  Recent Labs Lab 05/13/17 2138 05/14/17 0734 05/14/17 1210 05/14/17 1706 05/14/17 2141  GLUCAP 155* 180* 218* 236* 186*   Lipid Profile: No results for input(s): CHOL, HDL, LDLCALC, TRIG, CHOLHDL, LDLDIRECT in the last 72 hours. Thyroid Function Tests: No results for input(s): TSH, T4TOTAL, FREET4, T3FREE, THYROIDAB in the  last 72 hours. Anemia Panel: No results for input(s): VITAMINB12, FOLATE, FERRITIN, TIBC, IRON, RETICCTPCT in the last 72 hours. Urine analysis:    Component Value Date/Time   COLORURINE AMBER (A) 04/05/2017 0002   APPEARANCEUR HAZY (A) 04/05/2017 0002   LABSPEC 1.032 (H) 04/05/2017 0002   PHURINE 5.0 04/05/2017 0002   GLUCOSEU 50 (A) 04/05/2017 0002   HGBUR NEGATIVE 04/05/2017 0002   BILIRUBINUR NEGATIVE 04/05/2017 0002   KETONESUR 80 (A) 04/05/2017 0002   PROTEINUR 30 (A) 04/05/2017 0002   UROBILINOGEN 1.0 10/24/2014 2147   NITRITE NEGATIVE 04/05/2017 0002   LEUKOCYTESUR NEGATIVE 04/05/2017 0002      Radiology Studies: Dg Chest 2 View  Result Date: 05/13/2017 CLINICAL  DATA:  Shortness breath, cough, chest pain on LEFT radiating to LEFT shoulder yesterday, hypertension, CHF, former smoker, breast cancer EXAM: CHEST  2 VIEW COMPARISON:  05/12/2017 FINDINGS: Enlargement of cardiac silhouette. Atherosclerotic calcification aorta. Azygos fissure noted. Pulmonary vascularity normal. Bronchitic changes with hazy infiltrates in the perihilar to basilar regions bilaterally favor pulmonary edema over infection. Bibasilar pleural effusions. Upper lungs clear. No pneumothorax. Bones demineralized. IMPRESSION: Mild BILATERAL mid to basilar infiltrates question pulmonary edema less likely infection. Bibasilar pleural effusions. Aortic Atherosclerosis (ICD10-I70.0). Electronically Signed   By: Lavonia Dana M.D.   On: 05/13/2017 09:42    Scheduled Meds: . albuterol  5 mg Nebulization Once  . arformoterol  15 mcg Nebulization BID  . aspirin  81 mg Oral Daily  . azithromycin  500 mg Oral Daily  . benzocaine  1 application Mouth/Throat TID AC  . benzonatate  100 mg Oral TID  . budesonide  0.25 mg Nebulization BID  . carvedilol  3.125 mg Oral BID AC  . cholecalciferol  1,000 Units Oral Daily  . cycloSPORINE  1 drop Both Eyes BID  . DULoxetine  60 mg Oral Daily  . furosemide  40 mg Intravenous  Q12H  . gabapentin  300 mg Oral TID  . guaiFENesin   Oral BID  . heparin  5,000 Units Subcutaneous Q8H  . insulin aspart  0-15 Units Subcutaneous TID WC  . insulin glargine  10 Units Subcutaneous QHS  . insulin glargine  10 Units Subcutaneous QHS  . ipratropium-albuterol  3 mL Nebulization Q6H  . levothyroxine  75 mcg Oral QAC breakfast  . Melatonin  3 mg Oral QHS  . pantoprazole  40 mg Oral Q0600  . polyvinyl alcohol  1 drop Both Eyes BID  . potassium chloride  10 mEq Oral Daily  . predniSONE  60 mg Oral Q breakfast  . saccharomyces boulardii  250 mg Oral BID  . traZODone  50 mg Oral QHS   Continuous Infusions: . sodium chloride Stopped (05/12/17 1530)     LOS: 3 days    Time spent: [place], MD [facility] ([facility]   If 7PM-7AM, please contact night-coverage [email or web address] Password [facility] 05/15/2017, 7:23 AM

## 2017-05-16 LAB — BASIC METABOLIC PANEL
Anion gap: 12 (ref 5–15)
BUN: 22 mg/dL — ABNORMAL HIGH (ref 6–20)
CHLORIDE: 92 mmol/L — AB (ref 101–111)
CO2: 40 mmol/L — ABNORMAL HIGH (ref 22–32)
Calcium: 8.7 mg/dL — ABNORMAL LOW (ref 8.9–10.3)
Creatinine, Ser: 0.73 mg/dL (ref 0.44–1.00)
Glucose, Bld: 130 mg/dL — ABNORMAL HIGH (ref 65–99)
POTASSIUM: 3.8 mmol/L (ref 3.5–5.1)
SODIUM: 144 mmol/L (ref 135–145)

## 2017-05-16 LAB — GLUCOSE, CAPILLARY
GLUCOSE-CAPILLARY: 104 mg/dL — AB (ref 65–99)
GLUCOSE-CAPILLARY: 250 mg/dL — AB (ref 65–99)
Glucose-Capillary: 145 mg/dL — ABNORMAL HIGH (ref 65–99)

## 2017-05-16 MED ORDER — TRAZODONE HCL 50 MG PO TABS: 25.0000 mg | ORAL_TABLET | ORAL | 0 refills | Status: DC

## 2017-05-16 MED ORDER — FUROSEMIDE 40 MG PO TABS: 40.0000 mg | ORAL_TABLET | Freq: Two times a day (BID) | ORAL | 0 refills | Status: AC

## 2017-05-16 MED ORDER — FUROSEMIDE 40 MG PO TABS
40.0000 mg | ORAL_TABLET | Freq: Two times a day (BID) | ORAL | Status: DC
  Administered 2017-05-16 (×2): 40 mg via ORAL
  Filled 2017-05-16 (×2): qty 1

## 2017-05-16 MED ORDER — IPRATROPIUM-ALBUTEROL 0.5-2.5 (3) MG/3ML IN SOLN
3.0000 mL | Freq: Three times a day (TID) | RESPIRATORY_TRACT | Status: DC
  Administered 2017-05-16 (×2): 3 mL via RESPIRATORY_TRACT
  Filled 2017-05-16 (×3): qty 3

## 2017-05-16 MED ORDER — PREDNISONE 20 MG PO TABS: 60.0000 mg | ORAL_TABLET | Freq: Every day | ORAL | 0 refills | Status: DC

## 2017-05-16 MED ORDER — TRAMADOL HCL 50 MG PO TABS: 50.0000 mg | ORAL_TABLET | Freq: Three times a day (TID) | ORAL | 0 refills | Status: AC | PRN

## 2017-05-16 MED ORDER — DULOXETINE HCL 60 MG PO CPEP: 60.0000 mg | ORAL_CAPSULE | Freq: Every day | ORAL | 3 refills | Status: DC

## 2017-05-16 NOTE — Discharge Summary (Signed)
Physician Discharge Summary  Dawn Montoya [id number] DOB: Oct 08, 1932 DOA: 05/12/2017  PCP: Hendricks Limes, MD  Admit date: 05/12/2017 Discharge date: 05/16/2017  Time spent: 25 minutes  Recommendations for Outpatient Follow-up:  1. Burst of steroids as below at SNF, finished 5 days abx inhouse 2. Note changes  2 Lasix dose 60 mg--- > > 40 mg twice a day if the patient gains more than 2 pounds/24 hours give an extra dose 3. cmet and cbc 1 week 4. encourage ambulation 5. Need sOP Pulm follow up and ? Split polysomnography as OP--please refer patient to pulmonary 6. Provide patient with CHF and fluid restriction education  Discharge Diagnoses:  Active Problems:   COPD (chronic obstructive pulmonary disease) (HCC)   Acute on chronic respiratory failure with hypoxemia (HCC)   CKD (chronic kidney disease) stage 3, GFR 30-59 ml/min   GERD without esophagitis   COPD exacerbation (HCC)   DM (diabetes mellitus), secondary, uncontrolled, with peripheral vascular complications ([place])   COPD with acute exacerbation [facility])   Discharge Condition: improved  Diet recommendation: hh low salt fluid restrict  Filed Weights   05/13/17 0700 05/15/17 0819  Weight: 115.1 kg (253 lb 12 oz) 113.9 kg (251 lb)    History of present illness:  7 ? [place] resident GOLD "D" COPD on 3l/m O2 Diastolic HF EF 44-81% 85/6314 DM on insulin at home with nephropathy/neuropathy Hypothyroid stg III CKD bipolar Body mass index is 44.95 kg/m.  Recent admit 03/2017 acute resp failure and met encephalopathy-initally required Bipap Rx fullw for that visit with abx  apprently was Rx for PNA po levaquin 250 at [facility] over 2/2 to 2.2 lb weight gain --was increased on lasix 40-60 as OP  Came to ED as worsening sat 85%--cont neb, no response BP 90 syst  bnp 158, no whote count  admitted with multifactorial dyspnea? Pneumonia   Hospital Course:  MMRC grD 3-4 MULTIFACT DYSPNEA Likely  multifactorial AE COPD No pneumonia-stopped broad-spectrum antibiotics on admission Respiratory viral panel neg-d/c respiratory precaution This admission likely more a contribution from heart failure             Chest x-ray from 721 shows only fluid              azithromycin for COPD coverage             Solu-Medrol 40 q12 changed to prednisone 60--her breathing has improved--finish burst as below             Continue albuterol neb every 6 when necessary, Pulmicort 0.25 twice a day, ipratropium 0.5 every 6--continue Brovana as well which was added this admission             change Lasix oral 60 mg daily, I/o only -500cc, increase to 40 iv bid 7/22 with possible change to oral Lasix 7/24   D/c lasix dose 40 bid, give extra lasix if weight raises >2lb in 24 hr              chest x-ray 7/23 shows improving pleural effusions raising suspicion that this was volume overload             Weight on last discharge was 248, on this admission was 253-have asked for standing weights and strict in and out--on d/c weight was 251    Diabetes mellitus type 2 with nephropathy/neuropathy             Continue Lantus 12 units at bedtime, continue sliding scale coverage--sugars 178-244  Cymbalta for neuropathy/depression-60 mg daily             Continue gabapentin 300 3 times a day             Metformin PTA 500 twice a day on hold--resumed on d/c--need bmet 1 week  Stage III CK D-stable             Creatinine wnl Watch carefully as diuresis-repeat labs a.m.  Slight increase--see above  OHSS habitus             Not on CPAP?             Get nocturnal sat          Bipolar-stable             Cymbalta as above, trazodone 50 at bedtime  Diastolic heart failure-stable Blood pressure             Continue Coreg 3.125 ii/day             Continue valsartan 20 daily             Blood pressure well ctrll  Morbid obesity, Body mass index is 44.95 kg/m.             OP bariatrics?  ? Visual  hallucinations ? Dizziness secondary to orthostasis             Sounds like ocular issue and will need OP optho folow up              Updated patient's granddaughter 7/22  Inpatient pending resolution-Possible as early as 48 hours  Med surg    Discharge Exam: Vitals:   05/15/17 2144 05/16/17 0529  BP: 121/66 120/79  Pulse: 71 63  Resp: 18 18  Temp: 98.2 F (36.8 C) 98 F (36.7 C)    General: alert pleasant orietned in nad Cardiovascular: s1 s 2no m/r/g Respiratory: clear, mild wheeze feels much better No rales  Discharge Instructions   Discharge Instructions    Diet - low sodium heart healthy    Complete by:  As directed    Increase activity slowly    Complete by:  As directed      Current Discharge Medication List    START taking these medications   Details  predniSONE (DELTASONE) 20 MG tablet Take 3 tablets (60 mg total) by mouth daily with breakfast. Qty: 9 tablet, Refills: 0      CONTINUE these medications which have CHANGED   Details  DULoxetine (CYMBALTA) 60 MG capsule Take 1 capsule (60 mg total) by mouth daily. Refills: 3    furosemide (LASIX) 40 MG tablet Take 1 tablet (40 mg total) by mouth 2 (two) times daily. Qty: 60 tablet, Refills: 0    traMADol (ULTRAM) 50 MG tablet Take 1 tablet (50 mg total) by mouth every 8 (eight) hours as needed for severe pain. Qty: 3 tablet, Refills: 0    traZODone (DESYREL) 50 MG tablet Take 0.5-1 tablets (25-50 mg total) by mouth See admin instructions. Take 1 tablet (50 mg) by mouth daily at bedtime for depression and insomnia, may also take 1/2 tablet (25 mg) by mouth daily as needed fo anxiety for one month Qty: 3 tablet, Refills: 0      CONTINUE these medications which have NOT CHANGED   Details  acetaminophen (TYLENOL) 500 MG tablet Take 500 mg by mouth every 6 (six) hours as needed (pain).     albuterol (PROVENTIL HFA;VENTOLIN HFA) 108 (90 Base)  MCG/ACT inhaler Inhale 2 puffs into the lungs every 6 (six)  hours as needed for wheezing or shortness of breath.    arformoterol (BROVANA) 15 MCG/2ML NEBU Take 2 mLs (15 mcg total) by nebulization 2 (two) times daily. Qty: 120 mL, Refills: 5    ARTIFICIAL TEAR SOLUTION OP Place 1 drop into both eyes 4 (four) times daily as needed (for dry eyes).    aspirin 81 MG chewable tablet Chew 1 tablet (81 mg total) by mouth daily.    benzocaine (ORAJEL) 10 % mucosal gel Use as directed 1 application in the mouth or throat 3 (three) times daily before meals.    benzonatate (TESSALON) 100 MG capsule Take 1 capsule (100 mg total) by mouth 3 (three) times daily. Qty: 20 capsule, Refills: 0    bisacodyl (DULCOLAX) 10 MG suppository Place 10 mg rectally once as needed for mild constipation.     budesonide (PULMICORT) 0.25 MG/2ML nebulizer solution Take 0.25 mg by nebulization 2 (two) times daily.    carvedilol (COREG) 3.125 MG tablet Take 3.125 mg by mouth See admin instructions. Take 1 tablet 93.125 mg) by mouth twice daily (8am and 4pm) - Hold for HR less than 60 and SBP less than 100.    cholecalciferol (VITAMIN D) 1000 UNITS tablet Take 1,000 Units by mouth daily. Reported on 04/11/2016    cycloSPORINE (RESTASIS) 0.05 % ophthalmic emulsion Place 1 drop into both eyes See admin instructions. Instill 1 drop in both eyes twice daily - must be given at least 15 minutes after other eye drops    Dextromethorphan-Guaifenesin (MUCINEX DM MAXIMUM STRENGTH) 60-1200 MG TB12 Take 1 tablet by mouth 2 (two) times daily. Reported on 04/11/2016    gabapentin (NEURONTIN) 300 MG capsule Take 300 mg by mouth 3 (three) times daily.     insulin glargine (LANTUS) 100 unit/mL SOPN Inject 12 Units into the skin at bedtime.    ipratropium-albuterol (DUONEB) 0.5-2.5 (3) MG/3ML SOLN Take 3 mLs by nebulization every 6 (six) hours. FOR COPD    levothyroxine (SYNTHROID, LEVOTHROID) 75 MCG tablet Take 75 mcg by mouth daily before breakfast.     loratadine (CLARITIN) 10 MG tablet Take  10 mg by mouth daily.    magnesium hydroxide (MILK OF MAGNESIA) 400 MG/5ML suspension Take 30 mLs by mouth once as needed for mild constipation.     Melatonin 3 MG TABS Take 3 mg by mouth at bedtime.     metFORMIN (GLUCOPHAGE) 500 MG tablet Take 500 mg by mouth 2 (two) times daily after a meal.     nitroGLYCERIN (NITROSTAT) 0.4 MG SL tablet Place 0.4 mg under the tongue every 5 (five) minutes as needed for chest pain (call md if no relief after 3 doses).     OXYGEN Inhale 3 L into the lungs continuous. FOR HYPOXIA    pantoprazole (PROTONIX) 40 MG tablet Take 40 mg by mouth daily at 6 (six) AM.     polyethylene glycol (MIRALAX / GLYCOLAX) packet Take 17 g by mouth daily. Qty: 14 each, Refills: 0    potassium chloride (K-DUR,KLOR-CON) 10 MEQ tablet Take 10 mEq by mouth daily.    Propylene Glycol (SYSTANE BALANCE) 0.6 % SOLN Place 1 drop into both eyes 2 (two) times daily.    saccharomyces boulardii (FLORASTOR) 250 MG capsule Take 250 mg by mouth 2 (two) times daily. 13 day course started 05/04/17    simethicone (MYLICON) 80 MG chewable tablet Chew 1 tablet (80 mg total) by mouth 4 (four)  times daily as needed for flatulence. Qty: 30 tablet, Refills: 0    valsartan (DIOVAN) 40 MG tablet Take 20 mg by mouth daily. Take 1/2 tablet to = 20 mg      STOP taking these medications     levofloxacin (LEVAQUIN) 500 MG tablet      loperamide (IMODIUM) 2 MG capsule      Sodium Phosphates (RA SALINE ENEMA) 19-7 GM/118ML ENEM        No Known Allergies Contact information for after-discharge care    Destination    [facility] .   Specialty:  [place] information: [address] [place] [phone]               The results of significant diagnostics from this hospitalization (including imaging, microbiology, ancillary and laboratory) are listed below for reference.    Significant Diagnostic  Studies: Dg Chest 2 View  Result Date: 05/15/2017 CLINICAL DATA:  CHF, shortness of Breath EXAM: CHEST  2 VIEW COMPARISON:  05/13/2017 FINDINGS: Improving bibasilar opacities, likely improving edema. Small bilateral effusions. Heart is mildly enlarged. IMPRESSION: Improving pulmonary edema pattern.  Residual small effusions. Electronically Signed   By: Rolm Baptise M.D.   On: 05/15/2017 10:52   Dg Chest 2 View  Result Date: 05/13/2017 CLINICAL DATA:  Shortness breath, cough, chest pain on LEFT radiating to LEFT shoulder yesterday, hypertension, CHF, former smoker, breast cancer EXAM: CHEST  2 VIEW COMPARISON:  05/12/2017 FINDINGS: Enlargement of cardiac silhouette. Atherosclerotic calcification aorta. Azygos fissure noted. Pulmonary vascularity normal. Bronchitic changes with hazy infiltrates in the perihilar to basilar regions bilaterally favor pulmonary edema over infection. Bibasilar pleural effusions. Upper lungs clear. No pneumothorax. Bones demineralized. IMPRESSION: Mild BILATERAL mid to basilar infiltrates question pulmonary edema less likely infection. Bibasilar pleural effusions. Aortic Atherosclerosis (ICD10-I70.0). Electronically Signed   By: Lavonia Dana M.D.   On: 05/13/2017 09:42   Dg Chest Port 1 View  Result Date: 05/12/2017 CLINICAL DATA:  Shortness of breath.  Wheezing. EXAM: PORTABLE CHEST 1 VIEW COMPARISON:  04/12/2017 FINDINGS: There is haziness at the lung bases, right greater than left. This could represent pneumonia or pulmonary edema. The heart size and pulmonary vascularity appear normal. Calcification in the arch of the aorta. No acute bone abnormality. IMPRESSION: Hazy infiltrates at the lung bases, right more than left. This could represent pneumonia or pulmonary edema. Aortic atherosclerosis. Electronically Signed   By: Lorriane Shire M.D.   On: 05/12/2017 16:12    Microbiology: Recent Results (from the past 240 hour(s))  Blood Culture (routine x 2)     Status: None  (Preliminary result)   Collection Time: 05/12/17  5:14 PM  Result Value Ref Range Status   Specimen Description BLOOD RIGHT ANTECUBITAL  Final   Special Requests   Final    BOTTLES DRAWN AEROBIC AND ANAEROBIC Blood Culture adequate volume   Culture NO GROWTH 3 DAYS  Final   Report Status PENDING  Incomplete  Blood Culture (routine x 2)     Status: None (Preliminary result)   Collection Time: 05/12/17  6:10 PM  Result Value Ref Range Status   Specimen Description BLOOD RIGHT HAND  Final   Special Requests   Final    BOTTLES DRAWN AEROBIC AND ANAEROBIC Blood Culture adequate volume   Culture NO GROWTH 3 DAYS  Final   Report Status PENDING  Incomplete  Culture, blood (routine x 2) Call MD if unable to obtain prior  to antibiotics being given     Status: None (Preliminary result)   Collection Time: 05/12/17  8:10 PM  Result Value Ref Range Status   Specimen Description BLOOD RIGHT FOREARM  Final   Special Requests   Final    BOTTLES DRAWN AEROBIC ONLY Blood Culture adequate volume   Culture NO GROWTH 3 DAYS  Final   Report Status PENDING  Incomplete  Respiratory Panel by PCR     Status: None   Collection Time: 05/13/17  8:24 AM  Result Value Ref Range Status   Adenovirus NOT DETECTED NOT DETECTED Final   Coronavirus 229E NOT DETECTED NOT DETECTED Final   Coronavirus HKU1 NOT DETECTED NOT DETECTED Final   Coronavirus NL63 NOT DETECTED NOT DETECTED Final   Coronavirus OC43 NOT DETECTED NOT DETECTED Final   Metapneumovirus NOT DETECTED NOT DETECTED Final   Rhinovirus / Enterovirus NOT DETECTED NOT DETECTED Final   Influenza A NOT DETECTED NOT DETECTED Final   Influenza B NOT DETECTED NOT DETECTED Final   Parainfluenza Virus 1 NOT DETECTED NOT DETECTED Final   Parainfluenza Virus 2 NOT DETECTED NOT DETECTED Final   Parainfluenza Virus 3 NOT DETECTED NOT DETECTED Final   Parainfluenza Virus 4 NOT DETECTED NOT DETECTED Final   Respiratory Syncytial Virus NOT DETECTED NOT DETECTED Final    Bordetella pertussis NOT DETECTED NOT DETECTED Final   Chlamydophila pneumoniae NOT DETECTED NOT DETECTED Final   Mycoplasma pneumoniae NOT DETECTED NOT DETECTED Final     Labs: Basic Metabolic Panel:  Recent Labs Lab 05/12/17 1541 05/13/17 0402 05/14/17 0543 05/15/17 0322 05/16/17 0613  NA 142 142 140 139 144  K 4.2 4.6 4.5 4.3 3.8  CL 97* 103 99* 93* 92*  CO2 35* 30 33* 37* 40*  GLUCOSE 135* 220* 177* 244* 130*  BUN _0 22*  CREATININE 0.65 0.53 0.72 0.80 0.73  CALCIUM 8.9 8.6* 8.6* 8.8* 8.7*   Liver Function Tests:  Recent Labs Lab 05/14/17 0543  AST 14*  ALT 9*  ALKPHOS 46  BILITOT 0.6  PROT 5.5*  ALBUMIN 2.8*   No results for input(s): LIPASE, AMYLASE in the last 168 hours. No results for input(s): AMMONIA in the last 168 hours. CBC:  Recent Labs Lab 05/10/17 05/12/17 1541 05/13/17 0402 05/13/17 1430  WBC 7.6 9.4 5.1 8.7  NEUTROABS 5  --   --  7.6  HGB 10.9* 10.8* 10.4* 10.7*  HCT 33* 36.4 35.2* 35.5*  MCV  --  104.0* 101.1* 101.4*  PLT 275 349 318 380   Cardiac Enzymes: No results for input(s): CKTOTAL, CKMB, CKMBINDEX, TROPONINI in the last 168 hours. BNP: BNP (last 3 results)  Recent Labs  12/29/16 0321 04/05/17 1615 05/12/17 1810  BNP 121.8* 140.2* 158.3*    ProBNP (last 3 results)  Recent Labs  04/12/17 1136  PROBNP 153.0*    CBG:  Recent Labs Lab 05/15/17 1153 05/15/17 1705 05/15/17 2144 05/16/17 0758 05/16/17 1213  GLUCAP 209* 245* 232* 104* 145*       Signed:  Nita Sells MD   [facility] 05/16/2017, 12:40 PM

## 2017-05-16 NOTE — Progress Notes (Signed)
Report called to [place], LPN at [facility], [place]

## 2017-05-16 NOTE — NC FL2 (Signed)
[place] LEVEL OF CARE SCREENING TOOL     IDENTIFICATION  Patient Name: Dawn Montoya Birthdate: [birth year] Sex: female Admission Date (Current Location): 05/12/2017  [facility] and [place] Number:  Herbalist and Address:  [facility], [place] [address], [place], [place] [address]      Provider Number: [id number]  Attending Physician Name and Address:  Nita Sells, MD  Relative Name and Phone Number:       Current Level of Care: Hospital Recommended Level of Care: [place] Prior Approval Number:    Date Approved/Denied:   PASRR Number:  [id number]   Discharge Plan: SNF    Current Diagnoses: Patient Active Problem List   Diagnosis Date Noted  . COPD with acute exacerbation ([place]) 05/12/2017  . DM (diabetes mellitus), secondary, uncontrolled, with peripheral vascular complications ([place]) 47/06/6282  . Sepsis ([place]) 04/10/2017  . Acute respiratory failure with hypoxia and hypercapnia ([place]) 04/05/2017  . HCAP (healthcare-associated pneumonia) 04/05/2017  . Acute metabolic encephalopathy 66/29/4765  . Chronic respiratory failure ([place]) 02/06/2017  . Osteoarthritis 12/31/2016  . DDD (degenerative disc disease), lumbar 12/31/2016  . Aortic atherosclerosis ([place]) 12/31/2016  . Sacral pain 12/30/2016  . Severe obesity (BMI >= 40) ([place]) 12/29/2016  . MRSA carrier 12/29/2016  . Ill-fitting dentures 12/28/2016  . Recurrent major depression ([place]) 10/03/2016  . COPD exacerbation ([place]) 09/18/2016  . Pulmonary edema 09/18/2016  . Spondylosis, cervical, with myelopathy 04/11/2016  . Meningioma ([place]) 04/11/2016  . GERD without esophagitis 01/19/2016  . Benign paroxysmal positional vertigo 07/18/2015  . Neuropathy 07/13/2015  . Cervical myelopathy with cervical radiculopathy 05/08/2015  . Anxiety state 02/25/2015  . Solitary pulmonary nodule 01/01/2015  . CKD (chronic kidney disease) stage 3, GFR 30-59 ml/min  12/15/2014  . Carbon dioxide retention 12/15/2014  . Uncontrolled hypertension 10/25/2014  . Hypertensive heart disease with acute on chronic diastolic congestive heart failure ([place]) 10/24/2014  . Acute on chronic diastolic CHF (congestive heart failure) ([place]) 02/22/2014  . COPD (chronic obstructive pulmonary disease) ([place]) 01/18/2014  . Acute on chronic respiratory failure with hypoxemia ([place]) 01/18/2014  . Anemia 01/18/2014  . Hypothyroidism 01/18/2014    Orientation RESPIRATION BLADDER Height & Weight     Self, Time, Situation, Place  O2 (Nasal Cannula; 4L) Continent Weight: 251 lb (113.9 kg) Height:  5\' 3"  (160 cm)  BEHAVIORAL SYMPTOMS/MOOD NEUROLOGICAL BOWEL NUTRITION STATUS      Continent  (Please see d/c summary)  AMBULATORY STATUS COMMUNICATION OF NEEDS Skin   Limited Assist Verbally Normal                       Personal Care Assistance Level of Assistance  Bathing, Feeding, Dressing Bathing Assistance: Limited assistance Feeding assistance: Independent Dressing Assistance: Limited assistance     Functional Limitations Info  Sight, Hearing, Speech Sight Info: Adequate Hearing Info: Adequate Speech Info: Adequate    SPECIAL CARE FACTORS FREQUENCY  PT (By licensed PT), OT (By licensed OT)     PT Frequency: 3x week OT Frequency: 3x week            Contractures Contractures Info: Not present    Additional Factors Info  Code Status, Allergies, Isolation Precautions Code Status Info: DNR Allergies Info: No known allergies     Isolation Precautions Info: MRSA     Current Medications (05/16/2017):  This is the current hospital active medication list Current Facility-Administered Medications  Medication Dose Route Frequency Provider Last Rate  Last Dose  . 0.9 %  sodium chloride infusion   Intravenous Continuous Lajean Saver, MD   Stopped at 05/12/17 1530  . acetaminophen (TYLENOL) tablet 500 mg  500 mg Oral Q6H PRN Velvet Bathe, MD   500 mg at  05/14/17 1237  . albuterol (PROVENTIL) (2.5 MG/3ML) 0.083% nebulizer solution 3 mL  3 mL Inhalation Q6H PRN Velvet Bathe, MD      . albuterol (PROVENTIL) (2.5 MG/3ML) 0.083% nebulizer solution 5 mg  5 mg Nebulization Once Lajean Saver, MD      . arformoterol [facility]) nebulizer solution 15 mcg  15 mcg Nebulization BID Nita Sells, MD   15 mcg at 05/16/17 0926  . aspirin chewable tablet 81 mg  81 mg Oral Daily Velvet Bathe, MD   81 mg at 05/15/17 1000  . azithromycin (ZITHROMAX) tablet 500 mg  500 mg Oral Daily Nita Sells, MD   500 mg at 05/15/17 1000  . benzocaine (ORAJEL) 10 % mucosal gel 1 application  1 application Mouth/Throat TID AC Velvet Bathe, MD   1 application at 92/42/68 [phone]  . benzonatate (TESSALON) capsule 100 mg  100 mg Oral TID Velvet Bathe, MD   100 mg at 05/15/17 2104  . bisacodyl (DULCOLAX) suppository 10 mg  10 mg Rectal Once PRN Velvet Bathe, MD      . budesonide (PULMICORT) nebulizer solution 0.25 mg  0.25 mg Nebulization BID Velvet Bathe, MD   0.25 mg at 05/16/17 6222  . carvedilol (COREG) tablet 3.125 mg  3.125 mg Oral BID Minna Antis, MD   3.125 mg at 05/16/17 0827  . cholecalciferol (VITAMIN D) tablet 1,000 Units  1,000 Units Oral Daily Velvet Bathe, MD   1,000 Units at 05/15/17 1000  . cycloSPORINE (RESTASIS) 0.05 % ophthalmic emulsion 1 drop  1 drop Both Eyes BID Velvet Bathe, MD   1 drop at 05/15/17 2104  . DULoxetine (CYMBALTA) DR capsule 60 mg  60 mg Oral Daily Velvet Bathe, MD   60 mg at 05/15/17 1000  . furosemide (LASIX) tablet 40 mg  40 mg Oral BID Nita Sells, MD      . gabapentin (NEURONTIN) capsule 300 mg  300 mg Oral TID Velvet Bathe, MD   300 mg at 05/15/17 2104  . guaiFENesin (MUCINEX) 12 hr tablet   Oral BID Velvet Bathe, MD   600 mg at 05/15/17 2104  . heparin injection 5,000 Units  5,000 Units Subcutaneous Q8H Velvet Bathe, MD   5,000 Units at 05/16/17 0509  . insulin aspart (novoLOG) injection 0-15 Units   0-15 Units Subcutaneous TID WC Velvet Bathe, MD   5 Units at 05/15/17 1737  . insulin glargine (LANTUS) injection 10 Units  10 Units Subcutaneous QHS Velvet Bathe, MD   10 Units at 05/15/17 2215  . ipratropium-albuterol (DUONEB) 0.5-2.5 (3) MG/3ML nebulizer solution 3 mL  3 mL Nebulization TID Nita Sells, MD   3 mL at 05/16/17 0926  . levothyroxine (SYNTHROID, LEVOTHROID) tablet 75 mcg  75 mcg Oral QAC breakfast Velvet Bathe, MD   75 mcg at 05/16/17 0827  . Melatonin TABS 3 mg  3 mg Oral QHS Velvet Bathe, MD   3 mg at 05/15/17 2105  . nitroGLYCERIN (NITROSTAT) SL tablet 0.4 mg  0.4 mg Sublingual Q5 min PRN Velvet Bathe, MD      . pantoprazole (PROTONIX) EC tablet 40 mg  40 mg Oral Q0600 Velvet Bathe, MD   40 mg at 05/16/17 0509  . polyvinyl alcohol (LIQUIFILM TEARS)  1.4 % ophthalmic solution 1 drop  1 drop Both Eyes BID Velvet Bathe, MD   1 drop at 05/15/17 2105  . potassium chloride (K-DUR,KLOR-CON) CR tablet 10 mEq  10 mEq Oral Daily Velvet Bathe, MD   10 mEq at 05/15/17 1000  . predniSONE (DELTASONE) tablet 60 mg  60 mg Oral Q breakfast Nita Sells, MD   60 mg at 05/16/17 0827  . saccharomyces boulardii (FLORASTOR) capsule 250 mg  250 mg Oral BID Velvet Bathe, MD   250 mg at 05/15/17 2104  . simethicone (MYLICON) chewable tablet 80 mg  80 mg Oral QID PRN Velvet Bathe, MD      . traMADol Veatrice [place]) tablet 50 mg  50 mg Oral Q8H PRN Velvet Bathe, MD   50 mg at 05/16/17 0933  . traZODone (DESYREL) tablet 50 mg  50 mg Oral QHS Velvet Bathe, MD   50 mg at 05/15/17 2104     Discharge Medications: Please see discharge summary for a list of discharge medications.  Relevant Imaging Results:  Relevant Lab Results:   Additional Information SSN: [id number]  Eileen Stanford, LCSW

## 2017-05-16 NOTE — Clinical Social Work Placement (Signed)
   CLINICAL SOCIAL WORK PLACEMENT  NOTE  Date:  05/16/2017  Patient Details  Name: Dawn Montoya MRN: [id number] Date of Birth: July 03, 1932  Clinical Social Work is seeking post-discharge placement for this patient at the [place] level of care (*CSW will initial, date and re-position this form in  chart as items are completed):      Patient/family provided with [place] Work Department's list of facilities offering this level of care within the geographic area requested by the patient (or if unable, by the patient's family).  Yes   Patient/family informed of their freedom to choose among providers that offer the needed level of care, that participate in Medicare, Medicaid or managed care program needed by the patient, have an available bed and are willing to accept the patient.      Patient/family informed of [facility]'s ownership interest in [facility], as well as of the fact that they are under no obligation to receive care at these facilities.  PASRR submitted to EDS on       PASRR number received on 05/16/17     Existing PASRR number confirmed on       FL2 transmitted to all facilities in geographic area requested by pt/family on 05/16/17     FL2 transmitted to all facilities within larger geographic area on       Patient informed that his/her managed care company has contracts with or will negotiate with certain facilities, including the following:        Yes   Patient/family informed of bed offers received.  Patient chooses bed at [facility] recommends and patient chooses bed at      Patient to be transferred to [facility] on 05/16/17.  Patient to be transferred to facility by [facility]     Patient family notified on 05/16/17 of transfer.  Name of family member notified:  Patient     PHYSICIAN       Additional Comment:     _______________________________________________ Eileen Stanford, LCSW 05/16/2017, 4:00 PM

## 2017-05-16 NOTE — Care Management Important Message (Signed)
Important Message  Patient Details  Name: Orla Estrin MRN: [id number] Date of Birth: 1932/01/18   Medicare Important Message Given:  Yes    Kora Groom 05/16/2017, 12:00 PM

## 2017-05-16 NOTE — Progress Notes (Signed)
[facility] on unit to transfer pt to [facility] and NT in room assisting pt to get dressed.  Called into room by PTAR transporter stating that patient fell.  Went into room with charge RN and found pt sitting on floor.  NT and pt stated that pt was getting back into bed and her legs slipped from under her and she fell on her right knee and buttocks.  Pt complaining of pain in right knee.  Pt was assisted back to bed by multiple staff.  Pt alert and oriented and VS stable.  MD notified and stated to monitor pt for 1 hour and then pt can still return to [facility] this evening.  Pt now states that her knee does not hurt.  [facility] has been called for transfer again to [facility].  Will continue to monitor pt.  Dawn Montoya

## 2017-05-16 NOTE — Clinical Social Work Note (Signed)
Clinical Social Worker facilitated patient discharge including contacting patient family and facility to confirm patient discharge plans.  Clinical information faxed to facility and family agreeable with plan.  CSW arranged ambulance transport via [facility] to [facility] .  RN to call [phone] for report prior to discharge. Patient will go to room 227B.  Clinical Social Worker will sign off for now as social work intervention is no longer needed. Please consult [place] again if new need arises.  [place], [place]

## 2017-05-17 ENCOUNTER — Telehealth: Payer: Self-pay

## 2017-05-17 LAB — BASIC METABOLIC PANEL
BUN: 26 — AB (ref 4–21)
CREATININE: 0.6 (ref 0.5–1.1)
Glucose: 62
POTASSIUM: 4.7 (ref 3.4–5.3)
Sodium: 144 (ref 137–147)

## 2017-05-17 LAB — CULTURE, BLOOD (ROUTINE X 2)
CULTURE: NO GROWTH
CULTURE: NO GROWTH
Culture: NO GROWTH
SPECIAL REQUESTS: ADEQUATE
SPECIAL REQUESTS: ADEQUATE
SPECIAL REQUESTS: ADEQUATE

## 2017-05-17 LAB — HIV ANTIBODY (ROUTINE TESTING W REFLEX): HIV SCREEN 4TH GENERATION: NONREACTIVE

## 2017-05-17 NOTE — Telephone Encounter (Signed)
This is a patient of [place], who was admitted to [facility] after hospitalization. [facility] F/U is needed. Hospital discharge from [facility] on 05/16/2017.

## 2017-05-18 ENCOUNTER — Non-Acute Institutional Stay (SKILLED_NURSING_FACILITY): Payer: Medicare Other | Admitting: Internal Medicine

## 2017-05-18 ENCOUNTER — Encounter: Payer: Self-pay | Admitting: Internal Medicine

## 2017-05-18 DIAGNOSIS — IMO0002 Reserved for concepts with insufficient information to code with codable children: Secondary | ICD-10-CM

## 2017-05-18 DIAGNOSIS — E1351 Other specified diabetes mellitus with diabetic peripheral angiopathy without gangrene: Secondary | ICD-10-CM

## 2017-05-18 DIAGNOSIS — J441 Chronic obstructive pulmonary disease with (acute) exacerbation: Secondary | ICD-10-CM | POA: Diagnosis not present

## 2017-05-18 DIAGNOSIS — I5033 Acute on chronic diastolic (congestive) heart failure: Secondary | ICD-10-CM | POA: Diagnosis not present

## 2017-05-18 DIAGNOSIS — E1365 Other specified diabetes mellitus with hyperglycemia: Secondary | ICD-10-CM | POA: Diagnosis not present

## 2017-05-18 NOTE — Patient Instructions (Addendum)
See assessment and plan under each diagnosis in the problem list and acutely for this visit Total time 38  minutes; greater than 50% of the visit spent counseling patient and coordinating care for problems addressed at this encounter  

## 2017-05-18 NOTE — Assessment & Plan Note (Addendum)
Continue Coreg & furosemide twice a day; monitor weights; sodium restriction Assess role of possible obesity hypoventilation in CHF via Pulmonary consult

## 2017-05-18 NOTE — Progress Notes (Signed)
NURSING HOME LOCATION:  [facility] ROOM NUMBER:  [id number]  CODE STATUS:  DNR  PCP:  Hendricks Limes, MD  [address] [place] [address]   This is a nursing facility follow up for [facility] readmission within 30 days  Interim medical record and care since last [place] visit was updated with review of diagnostic studies and change in clinical status since last visit were documented.  HPI: The patient was hospitalized 7/20-7/24/18 for acute on chronic respiratory failure with hypoxemia in the setting of end-stage COPD,GOLD D with maintenance oxygen at 3 L/m.  Prior to admission patient had received oral Levaquin for bronchitis but had progressive oxygen desaturation. In the emergency room O2 sats were 85%. The dyspnea was felt to be multifactorial most likely an exacerbation of her COPD. Broad-spectrum antibiotics initiated at admission were discontinued as no pneumonia was found on x-ray. Respiratory viral panel was negative Component of heart failure was questioned. Azithromycin was provided because of the COPD and parenteral steroids were initiated. The Solu-Medrol was transitioned to prednisone with improvement in her respiratory function. Aggressive pulmonary toilet with nebulizers were continued. With diuresis chest x-ray showed improving pleural effusions suggesting concomitant component of volume overload. At discharge weight was 251. Lasix was to be increased to twice a day with ongoing weight monitor. Glucoses ranged from 178 up to 244. Basal insulin was continued at bedtime.A1c was 8.7 on 03/17/17. For peripheral neuropathy gabapentin 300 mg 3 times a day was continued along with Cymbalta . The latter was indicated for clinical depression. Obstructive sleep apnea was suggested, pulmonary follow-up to assess need for CPAP was to be scheduled. Glucose range has been 92-224 2 the SNF.  Review of systems: Patient states that chest discomfort has been relieved  with maintenance O2. She denies any paroxysmal nocturnal dyspnea at this time. Edema is improved. She describes being intermittently "hot" without fever or right lower. Sleep was disturbed last night because of "shaking legs". She denies a history of restless leg syndrome. She describes some discomfort in the abdomen when she sits up.  She states that she eats poorly because of mandibular pain. She has not replaced the lower plate which is lost. Hearing loss is chronic issue.   Constitutional: No fever Eyes: No redness, discharge, pain, vision change ENT/mouth: No nasal congestion,  purulent discharge, earache,change in hearing ,sore throat  Respiratory: No hemoptysis Gastrointestinal: No heartburn,dysphagia,nausea / vomiting,rectal bleeding, melena,change in bowels Genitourinary: No dysuria,hematuria, pyuria,  incontinence, nocturia Musculoskeletal: No joint stiffness, joint swelling, weakness,pain Dermatologic: No rash, pruritus, change in appearance of skin Neurologic: No dizziness,headache,syncope, seizures, numbness , tingling Endocrine: No change in hair/skin/ nails, excessive thirst, excessive hunger, excessive urination  Hematologic/lymphatic: No significant bruising, lymphadenopathy,abnormal bleeding Allergy/immunology: No itchy/ watery eyes, significant sneezing, urticaria, angioedema  Physical exam:  Pertinent or positive findings: she is obese. She sits in a wheelchair wearing nasal oxygen. She does have decreased auditory acuity. There is increased AP diameter. Breath sounds are markedly decreased. Heart sounds are distant. Abdomen is protuberant. She has tense nonpitting edema except for trace pitting at the sock line. All pulses are decreased.   General appearance:Adequately nourished; no acute distress   Lymphatic: No lymphadenopathy about the head, neck, axilla . Eyes: No conjunctival inflammation or lid edema is present. There is no scleral icterus. Ears:  External ear exam  shows no significant lesions or deformities.   Nose:  External nasal examination shows no deformity or inflammation. Nasal mucosa are  pink and moist without lesions ,exudates Oral exam: lips and gums are healthy appearing.There is no oropharyngeal erythema or exudate . Neck:  No thyromegaly, masses, tenderness noted.    Heart:  No gallop, murmur, click, rub .  Lungs: without wheezes, rhonchi,rales , rubs. Abdomen:Bowel sounds are normal. Abdomen is soft and nontender with no organomegaly, hernias,masses. GU: deferred  Extremities:  No cyanosis  Skin: Warm & dry w/o tenting. No significant lesions or rash.  See summary under each active problem in the Problem List with associated updated therapeutic plan

## 2017-05-18 NOTE — Assessment & Plan Note (Addendum)
Continue aggressive pulmonary toilet and wean prednisone  Pulmonary consult follow-up with assessment for possible need for CPAP with  obesity hypoventilation syndrome

## 2017-05-19 NOTE — Assessment & Plan Note (Signed)
Monitor glucoses as Prednisone weaned May need ac insulin in addition to basal Repeat A1c early September

## 2017-06-08 ENCOUNTER — Encounter: Payer: Self-pay | Admitting: Acute Care

## 2017-06-08 ENCOUNTER — Ambulatory Visit (INDEPENDENT_AMBULATORY_CARE_PROVIDER_SITE_OTHER): Payer: Medicare Other | Admitting: Acute Care

## 2017-06-08 VITALS — BP 128/78 | HR 82 | Ht 63.5 in | Wt 251.0 lb

## 2017-06-08 DIAGNOSIS — I5033 Acute on chronic diastolic (congestive) heart failure: Secondary | ICD-10-CM | POA: Diagnosis not present

## 2017-06-08 DIAGNOSIS — J9611 Chronic respiratory failure with hypoxia: Secondary | ICD-10-CM | POA: Diagnosis not present

## 2017-06-08 DIAGNOSIS — J441 Chronic obstructive pulmonary disease with (acute) exacerbation: Secondary | ICD-10-CM | POA: Diagnosis not present

## 2017-06-08 DIAGNOSIS — G4733 Obstructive sleep apnea (adult) (pediatric): Secondary | ICD-10-CM | POA: Diagnosis not present

## 2017-06-08 NOTE — Assessment & Plan Note (Signed)
Continue wearing oxygen at 3 L [place] Oxygen saturation goals are 88-92%

## 2017-06-08 NOTE — Assessment & Plan Note (Addendum)
Continue Lasix 40 mg BID and Coreg Labs per Dr. Linna Darner Continued Na restriction Sleep Study to evaluate  for OHS/OSA as contributing etiology of worsening heart failure Follow up with Dr. [place] Bells  in 3 months

## 2017-06-08 NOTE — Patient Instructions (Addendum)
It is nice to meet you today. Continue your nebulizer treatments as you have been doing  Continue Lasix as you have been doing.( Dr. Linna Darner managing) Continue wearing your oxygen at 3 L [place] as you have been doing. Remember oxygen saturation goals are 88-92%. We will schedule you for a split night sleep study to evaluate for OSA/OHS. We will call you results. Follow up with Dr. [place] Bells in 3 months  Please contact office for sooner follow up if symptoms do not improve or worsen or seek emergency care

## 2017-06-08 NOTE — Progress Notes (Signed)
History of Present Illness Dawn Montoya is a 83 y.o. female with Gold COPD D complicated by diastolic heart failure. She is followed by Dr. Lake Montoya.  Synopsis: Dawn Montoya has gold grade D COPD and was hospitalized in January 2016 for a COPD exacerbation which was complicated by diastolic heart failure. She was sent to a skilled nursing facility where she continues to participate in rehabilitation on a daily basis.   06/08/2017 Hospital Follow Up: Pt presents for 6 week follow up that is actually a hospital follow up.She was initially seen by Dr. Lake Montoya for hospital follow up after COPD exacerbation. When he saw her he felt she was in mild heart failure and increased her lasix x 3 days.He re-started her Spiriva and increased her Budesonide nebs to 0.5 mg BID. She continued with her Brovana and Albuterol prn. She was doing well until 05/12/2016.   She was admitted to the hospital from 8/93-8/07/1750 for CHF complicated by her COPD. She was treated with IV steroids, antibiotics, scheduled BD's and diuretics. She was deemed ready for discharge with increase in her Lasix to 40 mg BID and a prednisone taper. She presents today stating she is better. She states she is not having any sputum production. She is wearing her oxygen at 3 L Dawn Montoya. She denies any fever, chest pain , orthopnea or hemoptysis. She still has 1+ pitting edema bilaterally, hoever she states this is much better. She is in a wheelchair. She states her breathing is better than baseline at present. She states she has recently had a CXR at the Dawn Montoya facility. I do not have the results.She is participating in Physical Therapy at the Skilled facility, and she is participating in the exercise classes.  Deconditioned Obese Few basilar crackles Concern OHS/OSA may be cause of worsening CHF and more frequent hospitalization.  Test Results: CXR 05/15/2017 IMPRESSION: Improving pulmonary edema pattern.  Residual small effusions.  CBC Latest Ref  Rng & Units 05/13/2017 05/13/2017 05/12/2017  WBC 4.0 - 10.5 K/uL 8.7 5.1 9.4  Hemoglobin 12.0 - 15.0 g/dL 10.7(L) 10.4(L) 10.8(L)  Hematocrit 36.0 - 46.0 % 35.5(L) 35.2(L) 36.4  Platelets 150 - 400 K/uL 380 318 349    BMP Latest Ref Rng & Units 05/17/2017 05/16/2017 05/15/2017  Glucose 65 - 99 mg/dL - 130(H) 244(H)  BUN 4 - 21 26(A) 22(H) 20  Creatinine 0.5 - 1.1 0.6 0.73 0.80  Sodium 137 - 147 144 144 139  Potassium 3.4 - 5.3 4.7 3.8 4.3  Chloride 101 - 111 mmol/L - 92(L) 93(L)  CO2 22 - 32 mmol/L - 40(H) 37(H)  Calcium 8.9 - 10.3 mg/dL - 8.7(L) 8.8(L)    BNP    Component Value Date/Time   BNP 158.3 (H) 05/12/2017 1810    ProBNP    Component Value Date/Time   PROBNP 153.0 (H) 04/12/2017 1136    PFT No results found for: FEV1PRE, FEV1POST, FVCPRE, FVCPOST, TLC, DLCOUNC, PREFEV1FVCRT, PSTFEV1FVCRT  Dg Chest 2 View  Result Date: 05/15/2017 CLINICAL DATA:  CHF, shortness of Breath EXAM: CHEST  2 VIEW COMPARISON:  05/13/2017 FINDINGS: Improving bibasilar opacities, likely improving edema. Small bilateral effusions. Heart is mildly enlarged. IMPRESSION: Improving pulmonary edema pattern.  Residual small effusions. Electronically Signed   By: Rolm Baptise M.D.   On: 05/15/2017 10:52   Dg Chest 2 View  Result Date: 05/13/2017 CLINICAL DATA:  Shortness breath, cough, chest pain on LEFT radiating to LEFT shoulder yesterday, hypertension, CHF, former smoker, breast cancer EXAM: CHEST  2 VIEW  COMPARISON:  05/12/2017 FINDINGS: Enlargement of cardiac silhouette. Atherosclerotic calcification aorta. Azygos fissure noted. Pulmonary vascularity normal. Bronchitic changes with hazy infiltrates in the perihilar to basilar regions bilaterally favor pulmonary edema over infection. Bibasilar pleural effusions. Upper lungs clear. No pneumothorax. Bones demineralized. IMPRESSION: Mild BILATERAL mid to basilar infiltrates question pulmonary edema less likely infection. Bibasilar pleural effusions. Aortic  Atherosclerosis (ICD10-I70.0). Electronically Signed   By: Lavonia Dana M.D.   On: 05/13/2017 09:42   Dg Chest Port 1 View  Result Date: 05/12/2017 CLINICAL DATA:  Shortness of breath.  Wheezing. EXAM: PORTABLE CHEST 1 VIEW COMPARISON:  04/12/2017 FINDINGS: There is haziness at the lung bases, right greater than left. This could represent pneumonia or pulmonary edema. The heart size and pulmonary vascularity appear normal. Calcification in the arch of the aorta. No acute bone abnormality. IMPRESSION: Hazy infiltrates at the lung bases, right more than left. This could represent pneumonia or pulmonary edema. Aortic atherosclerosis. Electronically Signed   By: Lorriane Shire M.D.   On: 05/12/2017 16:12     Past medical hx Past Medical History:  Diagnosis Date  . Anxiety state 02/25/2015  . Aortic atherosclerosis ([place]) 12/31/2016  . Aortic atherosclerosis ([place]) 12/31/2016  . Breast CA ([place])   . CKD (chronic kidney disease) stage 3, GFR 30-59 ml/min 12/15/2014  . COPD (chronic obstructive pulmonary disease) ([place])   . Diastolic CHF ([place]) 03/24/6072  . DM (diabetes mellitus), secondary, uncontrolled, with peripheral vascular complications ([place]) 05/02/6268  . GERD without esophagitis 01/19/2016  . Hordeolum externum (stye)    07/01/2016  . HTN (hypertension) 01/18/2014  . Hypertension   . Hypothyroidism 01/18/2014  . Pneumonia   . Recurrent major depression ([place]) 10/03/2016  . Sigmoid diverticulosis   . Spondylosis, cervical, with myelopathy 04/11/2016     Social History  Substance Use Topics  . Smoking status: Former Smoker    Packs/day: 2.00    Years: 65.00    Types: Cigarettes    Quit date: 10/25/1999  . Smokeless tobacco: Never Used  . Alcohol use No    Dawn Montoya reports that she quit smoking about 17 years ago. Her smoking use included Cigarettes. She has a 130.00 pack-year smoking history. She has never used smokeless tobacco. She reports that she does not drink alcohol or use  drugs.  Tobacco Cessation: Former smoker , 130 pack year smoking history, Quit 2001.  Past surgical hx, Family hx, Social hx all reviewed.  Current Outpatient Prescriptions on File Prior to Visit  Medication Sig  . acetaminophen (TYLENOL) 500 MG tablet Take 500 mg by mouth every 6 (six) hours as needed (pain).   Marland Kitchen albuterol (PROVENTIL HFA;VENTOLIN HFA) 108 (90 Base) MCG/ACT inhaler Inhale 2 puffs into the lungs every 6 (six) hours as needed for wheezing or shortness of breath.  Marland Kitchen arformoterol (BROVANA) 15 MCG/2ML NEBU Take 2 mLs (15 mcg total) by nebulization 2 (two) times daily.  . ARTIFICIAL TEAR SOLUTION OP Place 1 drop into both eyes 4 (four) times daily as needed (for dry eyes).  Marland Kitchen aspirin 81 MG chewable tablet Chew 1 tablet (81 mg total) by mouth daily.  . benzocaine (ORAJEL) 10 % mucosal gel Use as directed 1 application in the mouth or throat 3 (three) times daily before meals.  . benzonatate (TESSALON) 100 MG capsule Take 1 capsule (100 mg total) by mouth 3 (three) times daily.  . bisacodyl (DULCOLAX) 10 MG suppository Place 10 mg rectally once as needed for mild constipation.   . budesonide (PULMICORT)  0.25 MG/2ML nebulizer solution Take 0.25 mg by nebulization 2 (two) times daily.  . carvedilol (COREG) 3.125 MG tablet Take 3.125 mg by mouth See admin instructions. Take 1 tablet (3.125 mg) by mouth twice daily (8am and 4pm) - Hold for HR less than 60 and SBP less than 100.  . cholecalciferol (VITAMIN D) 1000 UNITS tablet Take 1,000 Units by mouth daily. Reported on 04/11/2016  . cycloSPORINE (RESTASIS) 0.05 % ophthalmic emulsion Place 1 drop into both eyes See admin instructions. Instill 1 drop in both eyes twice daily - must be given at least 15 minutes after other eye drops  . Dextromethorphan-Guaifenesin (MUCINEX DM MAXIMUM STRENGTH) 60-1200 MG TB12 Take 1 tablet by mouth 2 (two) times daily. Reported on 04/11/2016  . furosemide (LASIX) 40 MG tablet Take 1 tablet (40 mg total) by  mouth 2 (two) times daily.  Marland Kitchen gabapentin (NEURONTIN) 300 MG capsule Take 300 mg by mouth 3 (three) times daily.   . insulin glargine (LANTUS) 100 unit/mL SOPN Inject 12 Units into the skin at bedtime.  Marland Kitchen ipratropium-albuterol (DUONEB) 0.5-2.5 (3) MG/3ML SOLN Take 3 mLs by nebulization every 6 (six) hours. FOR COPD  . levothyroxine (SYNTHROID, LEVOTHROID) 75 MCG tablet Take 75 mcg by mouth daily before breakfast.   . loratadine (CLARITIN) 10 MG tablet Take 10 mg by mouth daily.  . magnesium hydroxide (MILK OF MAGNESIA) 400 MG/5ML suspension Take 30 mLs by mouth once as needed for mild constipation.   . Melatonin 3 MG TABS Take 3 mg by mouth at bedtime.   . metFORMIN (GLUCOPHAGE) 500 MG tablet Take 500 mg by mouth 2 (two) times daily after a meal.   . nitroGLYCERIN (NITROSTAT) 0.4 MG SL tablet Place 0.4 mg under the tongue every 5 (five) minutes as needed for chest pain (call md if no relief after 3 doses).   . OXYGEN Inhale 3 L into the lungs continuous. FOR HYPOXIA  . pantoprazole (PROTONIX) 40 MG tablet Take 40 mg by mouth daily at 6 (six) AM.   . polyethylene glycol (MIRALAX / GLYCOLAX) packet Take 17 g by mouth daily.  . potassium chloride (K-DUR,KLOR-CON) 10 MEQ tablet Take 10 mEq by mouth daily.  . predniSONE (DELTASONE) 20 MG tablet Take 3 tablets (60 mg total) by mouth daily with breakfast.  . Propylene Glycol (SYSTANE BALANCE) 0.6 % SOLN Place 1 drop into both eyes 2 (two) times daily.  . simethicone (MYLICON) 80 MG chewable tablet Chew 1 tablet (80 mg total) by mouth 4 (four) times daily as needed for flatulence.  . traMADol (ULTRAM) 50 MG tablet Take 1 tablet (50 mg total) by mouth every 8 (eight) hours as needed for severe pain.  . traZODone (DESYREL) 50 MG tablet Take 50 mg by mouth at bedtime.  . valsartan (DIOVAN) 40 MG tablet Take 20 mg by mouth daily. Take 1/2 tablet to = 20 mg  . DULoxetine (CYMBALTA) 60 MG capsule Take 1 capsule (60 mg total) by mouth daily.   No current  facility-administered medications on file prior to visit.      No Known Allergies  Review Of Systems:  Constitutional:   No  weight loss, night sweats,  Fevers, chills, fatigue, or  lassitude.  HEENT:   No headaches,  Difficulty swallowing,  Tooth/dental problems, or  Sore throat,                No sneezing, itching, ear ache, nasal congestion, post nasal drip,   CV:  No chest pain,  Orthopnea, PND, swelling in lower extremities, anasarca, dizziness, palpitations, syncope.   GI  No heartburn, indigestion, abdominal pain, nausea, vomiting, diarrhea, change in bowel habits, loss of appetite, bloody stools.   Resp: +baseline  shortness of breath with exertion less at rest.  No excess mucus, no productive cough,  No non-productive cough,  No coughing up of blood.  No change in color of mucus.  No wheezing.  No chest wall deformity  Skin: no rash or lesions.  GU: no dysuria, change in color of urine, no urgency or frequency.  No flank pain, no hematuria   MS:  No joint pain or swelling.  No decreased range of motion.  No back pain.  Psych:  No change in mood or affect. No depression or anxiety.  No memory loss.   Vital Signs BP 128/78 (BP Location: Right Arm, Patient Position: Sitting, Cuff Size: Normal) Comment (BP Location): forearm  Pulse 82   Ht 5' 3.5" (1.613 m)   Wt 251 lb (113.9 kg)   SpO2 97%   BMI 43.77 kg/m    Physical Exam:  General- No distress,  A&Ox3, pleasant female in wheelchair wearing oxygen ENT: No sinus tenderness, TM clear, pale nasal mucosa, no oral exudate,no post nasal drip, no LAN Cardiac: S1, S2, regular rate and rhythm, no murmur Chest: No wheeze/ few rales per bases/ no  dullness; no accessory muscle use, no nasal flaring, no sternal retractions,  Abd.: Soft Non-tender, obese Ext: No clubbing cyanosis,1+ bilateral lower extremity edema Neuro: deconditioned at baseline Skin: No rashes, warm and dry Psych: normal mood and  behavior   Assessment/Plan  COPD with acute exacerbation (HCC) Resolved flare Plan: Continued aggressive Pulmonary Toilet Continue your nebulizer treatments as you have been doing  Continue Lasix as you have been doing.( Dr. Linna Darner managing) Continue wearing your oxygen at 3 L [place] as you have been doing. Remember oxygen saturation goals are 88-92%. We will schedule you for a split night sleep study to evaluate for OSA/OHS. We will call you results. Follow up with Dr. Lake Montoya in 3 months  Please contact office for sooner follow up if symptoms do not improve or worsen or seek emergency care   Acute on chronic diastolic CHF (congestive heart failure) (HCC) Continue Lasix 40 mg BID and Coreg Labs per Dr. Linna Darner Continued Na restriction Sleep Study to evaluate  for OHS/OSA as contributing etiology of worsening heart failure Follow up with Dr. Lake Montoya  in 3 months  Chronic respiratory failure ([place]) Continue wearing oxygen at 3 L [place] Oxygen saturation goals are 88-92%  Suspected OHS/OSA Sleep Study Lum Keas, NP 06/08/2017  8:52 PM

## 2017-06-08 NOTE — Assessment & Plan Note (Signed)
Resolved flare Plan: Continued aggressive Pulmonary Toilet Continue your nebulizer treatments as you have been doing  Continue Lasix as you have been doing.( Dr. Linna Darner managing) Continue wearing your oxygen at 3 L [place] as you have been doing. Remember oxygen saturation goals are 88-92%. We will schedule you for a split night sleep study to evaluate for OSA/OHS. We will call you results. Follow up with Dr. Lake Bells in 3 months  Please contact office for sooner follow up if symptoms do not improve or worsen or seek emergency care

## 2017-06-15 ENCOUNTER — Non-Acute Institutional Stay (SKILLED_NURSING_FACILITY): Payer: Medicare Other

## 2017-06-15 DIAGNOSIS — Z0000 Encounter for general adult medical examination without abnormal findings: Secondary | ICD-10-CM | POA: Diagnosis not present

## 2017-06-15 NOTE — Patient Instructions (Signed)
Dawn Montoya , Thank you for taking time to come for your Medicare Wellness Visit. I appreciate your ongoing commitment to your health goals. Please review the following plan we discussed and let me know if I can assist you in the future.   Screening recommendations/referrals: Colonoscopy excluded, pt over age 81 Mammogram excluded, pt over age 45 Bone Density due, excluded pt nonambulatory Recommended yearly ophthalmology/optometry visit for glaucoma screening and checkup Recommended yearly dental visit for hygiene and checkup  Vaccinations: Influenza vaccine due 2018 flue season Pneumococcal vaccine up to date Tdap vaccine due, ordered Shingles vaccine not in records  Advanced directives: DNR in chart, copies of health care power of attorney and living will are needed   Conditions/risks identified: None  Next appointment: Dr. Linna Darner makes rounds   Preventive Care 81 Years and Older, Female and Older, Female Preventive care refers to lifestyle choices and visits with your health care provider that can promote health and wellness. What does preventive care include?  A yearly physical exam. This is also called an annual well check.  Dental exams once or twice a year.  Routine eye exams. Ask your health care provider how often you should have your eyes checked.  Personal lifestyle choices, including:  Daily care of your teeth and gums.  Regular physical activity.  Eating a healthy diet.  Avoiding tobacco and drug use.  Limiting alcohol use.  Practicing safe sex.  Taking low-dose aspirin every day.  Taking vitamin and mineral supplements as recommended by your health care provider. What happens during an annual well check? The services and screenings done by your health care provider during your annual well check will depend on your age, overall health, lifestyle risk factors, and family history of disease. Counseling  Your health care provider may ask you questions about  your:  Alcohol use.  Tobacco use.  Drug use.  Emotional well-being.  Home and relationship well-being.  Sexual activity.  Eating habits.  History of falls.  Memory and ability to understand (cognition).  Work and work Statistician.  Reproductive health. Screening  You may have the following tests or measurements:  Height, weight, and BMI.  Blood pressure.  Lipid and cholesterol levels. These may be checked every 5 years, or more frequently if you are over 5 years old.  Skin check.  Lung cancer screening. You may have this screening every year starting at age 22 if you have a 30-pack-year history of smoking and currently smoke or have quit within the past 15 years.  Fecal occult blood test (FOBT) of the stool. You may have this test every year starting at age 62.  Flexible sigmoidoscopy or colonoscopy. You may have a sigmoidoscopy every 5 years or a colonoscopy every 10 years starting at age 81.  Hepatitis C blood test.  Hepatitis B blood test.  Sexually transmitted disease (STD) testing.  Diabetes screening. This is done by checking your blood sugar (glucose) after you have not eaten for a while (fasting). You may have this done every 1-3 years.  Bone density scan. This is done to screen for osteoporosis. You may have this done starting at age 16.  Mammogram. This may be done every 1-2 years. Talk to your health care provider about how often you should have regular mammograms. Talk with your health care provider about your test results, treatment options, and if necessary, the need for more tests. Vaccines  Your health care provider may recommend certain vaccines, such as:  Influenza vaccine. This is recommended every year.  Tetanus, diphtheria, and acellular pertussis (Tdap, Td) vaccine. You may need a Td booster every 10 years.  Zoster vaccine. You may need this after age 81.  Pneumococcal 13-valent conjugate (PCV13) vaccine. One dose is recommended  after age 81.  Pneumococcal polysaccharide (PPSV23) vaccine. One dose is recommended after age 59. Talk to your health care provider about which screenings and vaccines you need and how often you need them. This information is not intended to replace advice given to you by your health care provider. Make sure you discuss any questions you have with your health care provider. Document Released: 11/06/2015 Document Revised: 06/29/2016 Document Reviewed: 08/11/2015 Elsevier Interactive Patient Education  2017 [place] Prevention in the Home Falls can cause injuries. They can happen to people of all ages. There are many things you can do to make your home safe and to help prevent falls. What can I do on the outside of my home?  Regularly fix the edges of walkways and driveways and fix any cracks.  Remove anything that might make you trip as you walk through a door, such as a raised step or threshold.  Trim any bushes or trees on the path to your home.  Use bright outdoor lighting.  Clear any walking paths of anything that might make someone trip, such as rocks or tools.  Regularly check to see if handrails are loose or broken. Make sure that both sides of any steps have handrails.  Any raised decks and porches should have guardrails on the edges.  Have any leaves, snow, or ice cleared regularly.  Use sand or salt on walking paths during winter.  Clean up any spills in your garage right away. This includes oil or grease spills. What can I do in the bathroom?  Use night lights.  Install grab bars by the toilet and in the tub and shower. Do not use towel bars as grab bars.  Use non-skid mats or decals in the tub or shower.  If you need to sit down in the shower, use a plastic, non-slip stool.  Keep the floor dry. Clean up any water that spills on the floor as soon as it happens.  Remove soap buildup in the tub or shower regularly.  Attach bath mats securely with  double-sided non-slip rug tape.  Do not have throw rugs and other things on the floor that can make you trip. What can I do in the bedroom?  Use night lights.  Make sure that you have a light by your bed that is easy to reach.  Do not use any sheets or blankets that are too big for your bed. They should not hang down onto the floor.  Have a firm chair that has side arms. You can use this for support while you get dressed.  Do not have throw rugs and other things on the floor that can make you trip. What can I do in the kitchen?  Clean up any spills right away.  Avoid walking on wet floors.  Keep items that you use a lot in easy-to-reach places.  If you need to reach something above you, use a strong step stool that has a grab bar.  Keep electrical cords out of the way.  Do not use floor polish or wax that makes floors slippery. If you must use wax, use non-skid floor wax.  Do not have throw rugs and other things on the floor that can make you trip. What can I do  with my stairs?  Do not leave any items on the stairs.  Make sure that there are handrails on both sides of the stairs and use them. Fix handrails that are broken or loose. Make sure that handrails are as long as the stairways.  Check any carpeting to make sure that it is firmly attached to the stairs. Fix any carpet that is loose or worn.  Avoid having throw rugs at the top or bottom of the stairs. If you do have throw rugs, attach them to the floor with carpet tape.  Make sure that you have a light switch at the top of the stairs and the bottom of the stairs. If you do not have them, ask someone to add them for you. What else can I do to help prevent falls?  Wear shoes that:  Do not have high heels.  Have rubber bottoms.  Are comfortable and fit you well.  Are closed at the toe. Do not wear sandals.  If you use a stepladder:  Make sure that it is fully opened. Do not climb a closed stepladder.  Make  sure that both sides of the stepladder are locked into place.  Ask someone to hold it for you, if possible.  Clearly mark and make sure that you can see:  Any grab bars or handrails.  First and last steps.  Where the edge of each step is.  Use tools that help you move around (mobility aids) if they are needed. These include:  Canes.  Walkers.  Scooters.  Crutches.  Turn on the lights when you go into a dark area. Replace any light bulbs as soon as they burn out.  Set up your furniture so you have a clear path. Avoid moving your furniture around.  If any of your floors are uneven, fix them.  If there are any pets around you, be aware of where they are.  Review your medicines with your doctor. Some medicines can make you feel dizzy. This can increase your chance of falling. Ask your doctor what other things that you can do to help prevent falls. This information is not intended to replace advice given to you by your health care provider. Make sure you discuss any questions you have with your health care provider. Document Released: 08/06/2009 Document Revised: 03/17/2016 Document Reviewed: 11/14/2014 Elsevier Interactive Patient Education  2017 [employer].

## 2017-06-15 NOTE — Progress Notes (Signed)
Subjective:   Dawn Montoya is a 83 y.o. female who presents for an Initial Medicare Annual Wellness Visit at [facility] term [facility]    Objective:    Today's Vitals   06/15/17 1040  BP: 132/78  Pulse: 83  Temp: 99.1 F (37.3 C)  TempSrc: Oral  SpO2: 91%  Weight: 251 lb (113.9 kg)  Height: 5\' 4"  (1.626 m)   Body mass index is 43.08 kg/m.   Current Medications (verified) Outpatient Encounter Prescriptions as of 06/15/2017  Medication Sig  . acetaminophen (TYLENOL) 500 MG tablet Take 500 mg by mouth every 6 (six) hours as needed (pain).   Marland Kitchen albuterol (PROVENTIL HFA;VENTOLIN HFA) 108 (90 Base) MCG/ACT inhaler Inhale 2 puffs into the lungs every 6 (six) hours as needed for wheezing or shortness of breath.  Marland Kitchen arformoterol (BROVANA) 15 MCG/2ML NEBU Take 2 mLs (15 mcg total) by nebulization 2 (two) times daily.  . ARTIFICIAL TEAR SOLUTION OP Place 1 drop into both eyes 4 (four) times daily as needed (for dry eyes).  Marland Kitchen aspirin 81 MG chewable tablet Chew 1 tablet (81 mg total) by mouth daily.  . benzocaine (ORAJEL) 10 % mucosal gel Use as directed 1 application in the mouth or throat 3 (three) times daily before meals.  . benzonatate (TESSALON) 100 MG capsule Take 1 capsule (100 mg total) by mouth 3 (three) times daily.  . bisacodyl (DULCOLAX) 10 MG suppository Place 10 mg rectally once as needed for mild constipation.   . budesonide (PULMICORT) 0.25 MG/2ML nebulizer solution Take 0.25 mg by nebulization 2 (two) times daily.  . carvedilol (COREG) 3.125 MG tablet Take 3.125 mg by mouth See admin instructions. Take 1 tablet (3.125 mg) by mouth twice daily (8am and 4pm) - Hold for HR less than 60 and SBP less than 100.  . cholecalciferol (VITAMIN D) 1000 UNITS tablet Take 1,000 Units by mouth daily. Reported on 04/11/2016  . cycloSPORINE (RESTASIS) 0.05 % ophthalmic emulsion Place 1 drop into both eyes See admin instructions. Instill 1 drop in both eyes twice daily - must be given at least  15 minutes after other eye drops  . Dextromethorphan-Guaifenesin (MUCINEX DM MAXIMUM STRENGTH) 60-1200 MG TB12 Take 1 tablet by mouth 2 (two) times daily. Reported on 04/11/2016  . DULoxetine (CYMBALTA) 60 MG capsule Take 60 mg by mouth daily.  . furosemide (LASIX) 40 MG tablet Take 1 tablet (40 mg total) by mouth 2 (two) times daily.  Marland Kitchen gabapentin (NEURONTIN) 300 MG capsule Take 300 mg by mouth 3 (three) times daily.   . insulin glargine (LANTUS) 100 unit/mL SOPN Inject 12 Units into the skin at bedtime.  Marland Kitchen ipratropium-albuterol (DUONEB) 0.5-2.5 (3) MG/3ML SOLN Take 3 mLs by nebulization every 6 (six) hours. FOR COPD  . levothyroxine (SYNTHROID, LEVOTHROID) 75 MCG tablet Take 75 mcg by mouth daily before breakfast.   . loratadine (CLARITIN) 10 MG tablet Take 10 mg by mouth daily.  . magnesium hydroxide (MILK OF MAGNESIA) 400 MG/5ML suspension Take 30 mLs by mouth once as needed for mild constipation.   . Melatonin 3 MG TABS Take 3 mg by mouth at bedtime.   . metFORMIN (GLUCOPHAGE) 500 MG tablet Take 500 mg by mouth 2 (two) times daily after a meal.   . nitroGLYCERIN (NITROSTAT) 0.4 MG SL tablet Place 0.4 mg under the tongue every 5 (five) minutes as needed for chest pain (call md if no relief after 3 doses).   . OXYGEN Inhale 3 L into the lungs  continuous. FOR HYPOXIA  . pantoprazole (PROTONIX) 40 MG tablet Take 40 mg by mouth daily at 6 (six) AM.   . polyethylene glycol (MIRALAX / GLYCOLAX) packet Take 17 g by mouth daily.  . potassium chloride (K-DUR,KLOR-CON) 10 MEQ tablet Take 10 mEq by mouth daily.  . predniSONE (DELTASONE) 20 MG tablet Take 3 tablets (60 mg total) by mouth daily with breakfast.  . Propylene Glycol (SYSTANE BALANCE) 0.6 % SOLN Place 1 drop into both eyes 2 (two) times daily.  . simethicone (MYLICON) 80 MG chewable tablet Chew 1 tablet (80 mg total) by mouth 4 (four) times daily as needed for flatulence.  . traMADol (ULTRAM) 50 MG tablet Take 1 tablet (50 mg total) by mouth  every 8 (eight) hours as needed for severe pain.  . traZODone (DESYREL) 50 MG tablet Take 50 mg by mouth at bedtime.  . valsartan (DIOVAN) 40 MG tablet Take 20 mg by mouth daily. Take 1/2 tablet to = 20 mg  . DULoxetine (CYMBALTA) 60 MG capsule Take 1 capsule (60 mg total) by mouth daily.   No facility-administered encounter medications on file as of 06/15/2017.     Allergies (verified) Patient has no known allergies.   History: Past Medical History:  Diagnosis Date  . Anxiety state 02/25/2015  . Aortic atherosclerosis ([place]) 12/31/2016  . Aortic atherosclerosis ([place]) 12/31/2016  . Breast CA ([place])   . CKD (chronic kidney disease) stage 3, GFR 30-59 ml/min 12/15/2014  . COPD (chronic obstructive pulmonary disease) ([place])   . Diastolic CHF ([place]) 03/30/6194  . DM (diabetes mellitus), secondary, uncontrolled, with peripheral vascular complications ([place]) 0/93/2671  . GERD without esophagitis 01/19/2016  . Hordeolum externum (stye)    07/01/2016  . HTN (hypertension) 01/18/2014  . Hypertension   . Hypothyroidism 01/18/2014  . Pneumonia   . Recurrent major depression ([place]) 10/03/2016  . Sigmoid diverticulosis   . Spondylosis, cervical, with myelopathy 04/11/2016   Past Surgical History:  Procedure Laterality Date  . BREAST SURGERY    . CHOLECYSTECTOMY    . COLONOSCOPY N/A 01/20/2014   Procedure: COLONOSCOPY;  Surgeon: Winfield Cunas., MD;  Location: [facility];  Service: Endoscopy;  Laterality: N/A;  . ESOPHAGOGASTRODUODENOSCOPY N/A 01/19/2014   Procedure: ESOPHAGOGASTRODUODENOSCOPY (EGD);  Surgeon: Jeryl Columbia, MD;  Location: [facility];  Service: Endoscopy;  Laterality: N/A;  . MASTECTOMY Left    Family History  Problem Relation Age of Onset  . Lung cancer Sister   . Breast cancer Sister   . Breast cancer Sister   . Breast cancer Daughter   . Heart attack Neg Hx    Social History   Occupational History  . Not on file.   Social History Main Topics  . Smoking status: Former  Smoker    Packs/day: 2.00    Years: 65.00    Types: Cigarettes    Quit date: 10/25/1999  . Smokeless tobacco: Never Used  . Alcohol use No  . Drug use: No  . Sexual activity: No    Tobacco Counseling Counseling given: Not Answered   Activities of Daily Living In your present state of health, do you have any difficulty performing the following activities: 06/15/2017 05/12/2017  Hearing? Tempie Donning  Vision? N N  Difficulty concentrating or making decisions? Y N  Walking or climbing stairs? Y N  Dressing or bathing? Y N  Doing errands, shopping? Tempie Donning  Preparing Food and eating ? Y -  Using the Toilet? Y -  In the  past six months, have you accidently leaked urine? Y -  Do you have problems with loss of bowel control? Y -  Managing your Medications? Y -  Managing your Finances? Y -  Housekeeping or managing your Housekeeping? Y -  Some recent data might be hidden    Immunizations and Health Maintenance Immunization History  Administered Date(s) Administered  . Influenza Split 12/01/2014  . Influenza, High Dose Seasonal PF 09/09/2014  . Influenza,inj,Quad PF,6+ Mos 01/21/2014  . Influenza-Unspecified 08/04/2015, 07/29/2016  . PPD Test 11/14/2014, 01/15/2017  . Pneumococcal-Unspecified 10/24/2009, 10/24/2013   Health Maintenance Due  Topic Date Due  . FOOT EXAM  12/11/1941  . OPHTHALMOLOGY EXAM  12/11/1941  . INFLUENZA VACCINE  05/24/2017    Patient Care Team: Hendricks Limes, MD as PCP - General (Internal Medicine)  Indicate any recent Medical Services you may have received from other than [facility] providers in the past year (date may be approximate).     Assessment:   This is a routine wellness examination for Dawn Montoya.   Hearing/Vision screen No exam data present  Dietary issues and exercise activities discussed: Current Exercise Habits: The patient does not participate in regular exercise at present, Exercise limited by: orthopedic condition(s)  Goals    None      Depression Screen PHQ 2/9 Scores 06/15/2017  PHQ - 2 Score 0    Fall Risk Fall Risk  06/15/2017 01/25/2017 12/02/2016 07/15/2016 06/15/2016  Falls in the past year? Yes No No No No  Number falls in past yr: 1 - - - -  Injury with Fall? Yes - - - -    Cognitive Function:     6CIT Screen 06/15/2017 08/26/2016  What Year? 0 points 0 points  What month? 0 points 0 points  What time? 0 points 0 points  Count back from 20 0 points 0 points  Months in reverse 4 points 0 points  Repeat phrase 8 points 4 points  Total Score 12 4    Screening Tests Health Maintenance  Topic Date Due  . FOOT EXAM  12/11/1941  . OPHTHALMOLOGY EXAM  12/11/1941  . INFLUENZA VACCINE  05/24/2017  . PNA vac Low Risk Adult (2 of 2 - PCV13) 08/23/2017 (Originally 10/24/2014)  . DEXA SCAN  12/11/2023 (Originally 12/10/1998)  . TETANUS/TDAP  12/11/2023 (Originally 12/10/1950)  . HEMOGLOBIN A1C  09/17/2017      Plan:    I have personally reviewed and addressed the Medicare Annual Wellness questionnaire and have noted the following in the patient's chart:  A. Medical and social history B. Use of alcohol, tobacco or illicit drugs  C. Current medications and supplements D. Functional ability and status E.  Nutritional status F.  Physical activity G. Advance directives H. List of other physicians I.  Hospitalizations, surgeries, and ER visits in previous 12 months J.  [place] to include hearing, vision, cognitive, depression L. Referrals and appointments - none  In addition, I have reviewed and discussed with patient certain preventive protocols, quality metrics, and best practice recommendations. A written personalized care plan for preventive services as well as general preventive health recommendations were provided to patient.  See attached scanned questionnaire for additional information.   Signed,   Rich Reining, RN Nurse Health Advisor   Quick Notes   Health Maintenance: TDAP, eye  exam, foot exam, and DEXA due. DEXA not ordered pt nonambulatory     Abnormal Screen: 6 CIT-12     Patient Concerns: Needs dentures(lost bottom,  replacement for top)     Nurse Concerns: None   I have personally reviewed the health advisor's clinical note, was available for consultation, and agree with the assessment and plan as written. Hendricks Limes M.D., FACP, [facility]  Note: referral made to next mobile Dental Clinic @ SNF

## 2017-06-19 ENCOUNTER — Non-Acute Institutional Stay (SKILLED_NURSING_FACILITY): Payer: Medicare Other | Admitting: Adult Health

## 2017-06-19 ENCOUNTER — Encounter: Payer: Self-pay | Admitting: Adult Health

## 2017-06-19 DIAGNOSIS — E1351 Other specified diabetes mellitus with diabetic peripheral angiopathy without gangrene: Secondary | ICD-10-CM

## 2017-06-19 DIAGNOSIS — J449 Chronic obstructive pulmonary disease, unspecified: Secondary | ICD-10-CM | POA: Diagnosis not present

## 2017-06-19 DIAGNOSIS — K219 Gastro-esophageal reflux disease without esophagitis: Secondary | ICD-10-CM

## 2017-06-19 DIAGNOSIS — G4700 Insomnia, unspecified: Secondary | ICD-10-CM

## 2017-06-19 DIAGNOSIS — J9611 Chronic respiratory failure with hypoxia: Secondary | ICD-10-CM | POA: Diagnosis not present

## 2017-06-19 DIAGNOSIS — E1365 Other specified diabetes mellitus with hyperglycemia: Secondary | ICD-10-CM

## 2017-06-19 DIAGNOSIS — E034 Atrophy of thyroid (acquired): Secondary | ICD-10-CM

## 2017-06-19 DIAGNOSIS — F339 Major depressive disorder, recurrent, unspecified: Secondary | ICD-10-CM

## 2017-06-19 DIAGNOSIS — I5032 Chronic diastolic (congestive) heart failure: Secondary | ICD-10-CM | POA: Diagnosis not present

## 2017-06-19 DIAGNOSIS — J309 Allergic rhinitis, unspecified: Secondary | ICD-10-CM

## 2017-06-19 DIAGNOSIS — IMO0002 Reserved for concepts with insufficient information to code with codable children: Secondary | ICD-10-CM

## 2017-06-19 NOTE — Progress Notes (Signed)
DATE:  06/19/2017   MRN:  [id number]  BIRTHDAY: [birth year]  Facility:  Nursing Home Location:  [facility] Room Number: 227-B  LEVEL OF CARE:  SNF (31)  Contact Information    Name Relation Home Work [place] Grandaughter [phone]  [phone]       Code Status History    Date Active Date Inactive Code Status Order ID Comments User Context   05/12/2017  7:53 PM 05/16/2017 11:31 PM DNR [id number]  Velvet Bathe, MD Inpatient   05/12/2017  5:44 PM 05/12/2017  7:53 PM DNR [id number]  Velvet Bathe, MD ED   04/05/2017  8:59 PM 04/10/2017  3:34 PM DNR [id number]  Louellen Molder, MD Inpatient   12/29/2016 12:47 AM 01/01/2017  4:25 PM DNR [id number]  Etta Quill, DO ED   09/18/2016  5:43 AM 09/20/2016  8:43 PM DNR [id number]  Oswald Hillock, MD Inpatient   08/26/2016  3:06 PM 09/18/2016  2:20 AM DNR [id number]  Lauree Chandler, NP Outpatient   10/25/2014  1:38 AM 10/31/2014 10:19 PM Full Code [id number]  Rise Patience, MD Inpatient   02/22/2014  4:18 PM 02/25/2014  9:53 PM Full Code [id number]  Louellen Molder, MD Inpatient   01/18/2014  8:27 PM 01/27/2014 10:38 PM Full Code [id number]  Eugenie Filler, MD Inpatient    Questions for Most Recent Historical Code Status (Order [id number])    Question Answer Comment   In the event of cardiac or respiratory ARREST Do not call a "code blue"    In the event of cardiac or respiratory ARREST Do not perform Intubation, CPR, defibrillation or ACLS    In the event of cardiac or respiratory ARREST Use medication by any route, position, wound care, and other measures to relive pain and suffering. May use oxygen, suction and manual treatment of airway obstruction as needed for comfort.         Advance Directive Documentation     Most Recent Value  Type of Advance Directive  Out of facility DNR (pink MOST or yellow form)  Pre-existing out of facility DNR order (yellow form or pink MOST form)  -  "MOST" Form in  Place?  -       Chief Complaint  Patient presents with  . Medical Management of Chronic Issues    Routine visit    HISTORY OF PRESENT ILLNESS:  This is an 59-YO female seen for a routine visit.  She is a long-term care resident of [facility].  She has a PMH of chronic hypercapnic respiratory failure secondary to COPD (on continuous O2 at 3 L/min via [place]), chronic diastolic CHF, peripheral neuropathy, hypertension, and depression.  She was seen in the room today. Observed to have productive cough with slight greenish-yellowish phlegm. No reported fever. She was recently discharged from OT to restorative services.     PAST MEDICAL HISTORY:  Past Medical History:  Diagnosis Date  . Anxiety state 02/25/2015  . Aortic atherosclerosis ([place]) 12/31/2016  . Aortic atherosclerosis ([place]) 12/31/2016  . Breast CA ([place])   . CKD (chronic kidney disease) stage 3, GFR 30-59 ml/min 12/15/2014  . COPD (chronic obstructive pulmonary disease) ([place])   . Diastolic CHF ([place]) 04/29/9389  . DM (diabetes mellitus), secondary, uncontrolled, with peripheral vascular complications ([place]) 3/00/9233  . GERD without esophagitis 01/19/2016  . Hordeolum externum (stye)    07/01/2016  . HTN (hypertension) 01/18/2014  . Hypertension   . Hypothyroidism  01/18/2014  . Pneumonia   . Recurrent major depression ([place]) 10/03/2016  . Sigmoid diverticulosis   . Spondylosis, cervical, with myelopathy 04/11/2016     CURRENT MEDICATIONS: Reviewed  Patient's Medications  New Prescriptions   No medications on file  Previous Medications   ACETAMINOPHEN (TYLENOL) 500 MG TABLET    Take 500 mg by mouth every 6 (six) hours as needed (pain).    ALBUTEROL (PROVENTIL HFA;VENTOLIN HFA) 108 (90 BASE) MCG/ACT INHALER    Inhale 2 puffs into the lungs every 6 (six) hours as needed for wheezing or shortness of breath.   ARFORMOTEROL (BROVANA) 15 MCG/2ML NEBU    Take 2 mLs (15 mcg total) by nebulization 2 (two) times daily.    ARTIFICIAL TEAR SOLUTION OP    Place 1 drop into both eyes 4 (four) times daily as needed (for dry eyes).   ASPIRIN 81 MG CHEWABLE TABLET    Chew 1 tablet (81 mg total) by mouth daily.   BENZOCAINE (ORAJEL) 10 % MUCOSAL GEL    Use as directed 1 application in the mouth or throat 3 (three) times daily before meals.   BENZONATATE (TESSALON) 100 MG CAPSULE    Take 1 capsule (100 mg total) by mouth 3 (three) times daily.   BISACODYL (DULCOLAX) 10 MG SUPPOSITORY    Place 10 mg rectally once as needed for mild constipation.    BUDESONIDE (PULMICORT) 0.25 MG/2ML NEBULIZER SOLUTION    Take 0.25 mg by nebulization 2 (two) times daily.   CARVEDILOL (COREG) 3.125 MG TABLET    Take 3.125 mg by mouth See admin instructions. Take 1 tablet (3.125 mg) by mouth twice daily (8am and 4pm) - Hold for HR less than 60 and SBP less than 100.   CHOLECALCIFEROL (VITAMIN D) 1000 UNITS TABLET    Take 1,000 Units by mouth daily. Reported on 04/11/2016   CYCLOSPORINE (RESTASIS) 0.05 % OPHTHALMIC EMULSION    Place 1 drop into both eyes 2 (two) times daily. Must be given at least 15 minutes after other eye drops   DEXTROMETHORPHAN-GUAIFENESIN (MUCINEX DM MAXIMUM STRENGTH) 60-1200 MG TB12    Take 1 tablet by mouth 2 (two) times daily.    DULOXETINE (CYMBALTA) 60 MG CAPSULE    Take 60 mg by mouth daily.   FUROSEMIDE (LASIX) 40 MG TABLET    Take 1 tablet (40 mg total) by mouth 2 (two) times daily.   GABAPENTIN (NEURONTIN) 300 MG CAPSULE    Take 300 mg by mouth 3 (three) times daily.    INSULIN GLARGINE (LANTUS) 100 UNIT/ML SOPN    Inject 12 Units into the skin at bedtime.   IPRATROPIUM-ALBUTEROL (DUONEB) 0.5-2.5 (3) MG/3ML SOLN    Take 3 mLs by nebulization every 6 (six) hours. FOR COPD   LEVOTHYROXINE (SYNTHROID, LEVOTHROID) 75 MCG TABLET    Take 75 mcg by mouth daily before breakfast.    LOPERAMIDE (IMODIUM A-D) 2 MG TABLET    Take 4 mg by mouth daily as needed for diarrhea or loose stools.   LORATADINE (CLARITIN) 10 MG TABLET     Take 10 mg by mouth daily.   MAGNESIUM HYDROXIDE (MILK OF MAGNESIA) 400 MG/5ML SUSPENSION    Take 30 mLs by mouth once as needed for mild constipation.    MELATONIN 3 MG TABS    Take 3 mg by mouth at bedtime.    METFORMIN (GLUCOPHAGE) 500 MG TABLET    Take 500 mg by mouth 2 (two) times daily after a meal.  NITROGLYCERIN (NITROSTAT) 0.4 MG SL TABLET    Place 0.4 mg under the tongue every 5 (five) minutes as needed for chest pain (call md if no relief after 3 doses).    OXYGEN    Inhale 3 L into the lungs continuous. FOR HYPOXIA   PANTOPRAZOLE (PROTONIX) 40 MG TABLET    Take 40 mg by mouth daily at 6 (six) AM.    POLYETHYLENE GLYCOL (MIRALAX / GLYCOLAX) PACKET    Take 17 g by mouth daily.   POTASSIUM CHLORIDE (K-DUR,KLOR-CON) 10 MEQ TABLET    Take 10 mEq by mouth daily.   PREDNISONE (DELTASONE) 20 MG TABLET    Take 3 tablets (60 mg total) by mouth daily with breakfast.   PROPYLENE GLYCOL (SYSTANE BALANCE) 0.6 % SOLN    Place 1 drop into both eyes 2 (two) times daily.   SIMETHICONE (MYLICON) 80 MG CHEWABLE TABLET    Chew 1 tablet (80 mg total) by mouth 4 (four) times daily as needed for flatulence.   TRAMADOL (ULTRAM) 50 MG TABLET    Take 1 tablet (50 mg total) by mouth every 8 (eight) hours as needed for severe pain.   TRAZODONE (DESYREL) 50 MG TABLET    Take 50 mg by mouth at bedtime.   VALSARTAN (DIOVAN) 40 MG TABLET    Take 20 mg by mouth daily. Take 1/2 tablet to = 20 mg  Modified Medications   No medications on file  Discontinued Medications   DULOXETINE (CYMBALTA) 60 MG CAPSULE    Take 1 capsule (60 mg total) by mouth daily.     No Known Allergies   REVIEW OF SYSTEMS:  GENERAL: no change in appetite, no fatigue, no weight changes, no fever, chills or weakness MOUTH and THROAT: Denies oral discomfort, gingival pain or bleeding, pain from teeth or hoarseness   RESPIRATORY: no SOB, DOE, +cough CARDIAC: no chest pain, edema or palpitations GI: no abdominal pain, diarrhea,  constipation, heart burn, nausea or vomiting GU: Denies dysuria, frequency, hematuria, incontinence, or discharge PSYCHIATRIC: Denies feeling of depression or anxiety. No report of hallucinations, insomnia, paranoia, or agitation     PHYSICAL EXAMINATION  GENERAL APPEARANCE: Well nourished. In no acute distress. Morbidly obese SKIN:  Skin is warm and dry.  MOUTH and THROAT: Lips are without lesions. Oral mucosa is moist and without lesions. Tongue is normal in shape, size, and color and without lesions RESPIRATORY: Has faint wheezing on right upper chest, on O2 @ 3L/min via [place] CARDIAC: RRR, no murmur,no extra heart sounds, no edema GI: abdomen soft, normal BS, no masses, no tenderness, no hepatomegaly, no splenomegaly EXTREMITIES:  Able to move X 4 extremities PSYCHIATRIC: Alert and oriented X 3. Affect and behavior are appropriate   LABS/RADIOLOGY: Labs reviewed: Basic Metabolic Panel:  Recent Labs  09/19/16 0845 09/20/16 0525  05/14/17 0543 05/15/17 0322 05/16/17 3235 05/17/17  NA  --  139  < > 140 139 144 144  K  --  3.5  < > 4.5 4.3 3.8 4.7  CL  --  93*  < > 99* 93* 92*  --   CO2  --  34*  < > 33* 37* 40*  --   GLUCOSE  --  97  < > 177* 244* 130*  --   BUN  --  25*  < > 16 20 22* 26*  CREATININE  --  0.98  < > 0.72 0.80 0.73 0.6  CALCIUM  --  9.3  < > 8.6* 8.8*  8.7*  --   MG 2.2 2.1  --   --   --   --   --   PHOS 3.8 4.1  --   --   --   --   --   < > = values in this interval not displayed. Liver Function Tests:  Recent Labs  04/08/17 0342 04/09/17 0740 05/14/17 0543  AST 15 24 14*  ALT 16 17 9*  ALKPHOS 69 66 46  BILITOT 0.6 1.1 0.6  PROT 6.2* 6.0* 5.5*  ALBUMIN 2.9* 2.8* 2.8*   CBC:  Recent Labs  04/05/17 1615  05/10/17 05/12/17 1541 05/13/17 0402 05/13/17 1430  WBC 12.4*  < > 7.6 9.4 5.1 8.7  NEUTROABS 11.0*  --  5  --   --  7.6  HGB 12.6  < > 10.9* 10.8* 10.4* 10.7*  HCT 40.4  < > 33* 36.4 35.2* 35.5*  MCV 100.2*  < >  --  104.0* 101.1*  101.4*  PLT 239  < > 275 349 318 380  < > = values in this interval not displayed. CBG:  Recent Labs  05/16/17 0758 05/16/17 1213 05/16/17 1748  GLUCAP 104* 145* 250*      ASSESSMENT/PLAN:  1. Chronic diastolic CHF (congestive heart failure) (HCC) - latest weight is 253.2 and has gained 6.4 lbs since beginning of month, will increase Lasix from 40 mg BID to 60 mg BID X 5 days then back to 40 mg BID, will change KCL ER to 20 meq daily x 5 days then 10 meq daily, check BMP in 1 week, continue valsartan 40 mg 1/2 tab = 20 mg daily, carvedilol 3.125 mg 1 tab twice a day,   2. Chronic obstructive pulmonary disease, unspecified COPD type ([place]) - no SOB, continue prednisone 60 mg daily, albuterol when necessary, ipratropium albuterol via neb Q 6 hours, Brovana via neb twice a day, budesonide via neb twice a day, start Tessalon Perles 100 mg 2 capsules = 200 mg twice a day 10 days, discontinue Mucinex   3. DM (diabetes mellitus), secondary, 00 uncontrolled, with peripheral vascular complications (HCC) - CBGs has been well-controlled, continue metformin 500 mg 1 tab twice a day, Lantus 100 units/mL 12 units subcutaneous daily at bedtime, gabapentin 300 mg 1 capsule 3 times a day for neuropathy Lab Results  Component Value Date   HGBA1C 8.7 03/17/2017     4. Chronic respiratory failure with hypoxia (HCC) - no SOB noted, continue O2 at 3 L/minute via [place] continuously, albuterol when necessary, ipratropium albuterol via neb Q 6 hours,  Brovana via neb twice a day, budesonide via neb twice a day   5. Recurrent major depressive disorder, remission status unspecified (HCC) - stable, continue Cymbalta 60 mg 1 capsule daily   6. Insomnia, unspecified type - continue melatonin 3 mg 1 tab daily at bedtime and trazodone 50 mg 1 tab daily at bedtime   7. Allergic rhinitis, unspecified seasonality, unspecified trigger - no sneezing noted, continue allergy 10 mg 1 tab daily  8. GERD without  esophagitis - stable; continue pantoprazole 40 mg 1 tab daily   9. Hypothyroidism due to acquired atrophy of thyroid - continue Synthroid 75 g 1 tab daily Lab Results  Component Value Date   TSH 2.95 01/26/2017       Goals of care:  Long-term care     Salathiel Ferrara C. [place] - NP    [employer] [phone]

## 2017-06-20 ENCOUNTER — Non-Acute Institutional Stay (SKILLED_NURSING_FACILITY): Payer: Medicare Other | Admitting: Internal Medicine

## 2017-06-20 ENCOUNTER — Encounter: Payer: Self-pay | Admitting: Internal Medicine

## 2017-06-20 DIAGNOSIS — I5033 Acute on chronic diastolic (congestive) heart failure: Secondary | ICD-10-CM | POA: Diagnosis not present

## 2017-06-20 DIAGNOSIS — J418 Mixed simple and mucopurulent chronic bronchitis: Secondary | ICD-10-CM | POA: Diagnosis not present

## 2017-06-20 DIAGNOSIS — J441 Chronic obstructive pulmonary disease with (acute) exacerbation: Secondary | ICD-10-CM

## 2017-06-20 NOTE — Assessment & Plan Note (Addendum)
06/20/17 nebulized treatments will be continued every 4 hours 3 and then every 4 hrs as needed. Prednisone taper will be verified. Zpack for fever & purulent sputum

## 2017-06-20 NOTE — Patient Instructions (Signed)
See assessment and plan under each diagnosis in the problem list and acutely for this visit 

## 2017-06-20 NOTE — Assessment & Plan Note (Signed)
Continue maintenance low dose steroids after weaning high dose OSA/OHS evaluation

## 2017-06-20 NOTE — Assessment & Plan Note (Signed)
Berniece Salines present on breakfast plate Enforce  low-sodium diet ;carvedilol may need titration if blood pressure can tolerate

## 2017-06-20 NOTE — Progress Notes (Signed)
    NURSING HOME LOCATION:  [facility] ROOM NUMBER:  227-B  CODE STATUS:  DNR  PCP:  Hendricks Limes, MD  [address] [place] [address]    This is a nursing facility follow up for specific acute issue of  of chronic medical diagnoses  [facility] readmission within 30 days  Interim medical record and care since last [place] visit was updated with review of diagnostic studies and change in clinical status since last visit were documented.  HPI:  Review of systems: Dementia invalidated responses. Date given as   Constitutional: No fever,significant weight change, fatigue  Eyes: No redness, discharge, pain, vision change ENT/mouth: No nasal congestion,  purulent discharge, earache,change in hearing ,sore throat  Cardiovascular: No chest pain, palpitations,paroxysmal nocturnal dyspnea, claudication, edema  Respiratory: No cough, sputum production,hemoptysis, DOE , significant snoring,apnea  Gastrointestinal: No heartburn,dysphagia,abdominal pain, nausea / vomiting,rectal bleeding, melena,change in bowels Genitourinary: No dysuria,hematuria, pyuria,  incontinence, nocturia Musculoskeletal: No joint stiffness, joint swelling, weakness,pain Dermatologic: No rash, pruritus, change in appearance of skin Neurologic: No dizziness,headache,syncope, seizures, numbness , tingling Psychiatric: No significant anxiety , depression, insomnia, anorexia Endocrine: No change in hair/skin/ nails, excessive thirst, excessive hunger, excessive urination  Hematologic/lymphatic: No significant bruising, lymphadenopathy,abnormal bleeding Allergy/immunology: No itchy/ watery eyes, significant sneezing, urticaria, angioedema  Physical exam:  Pertinent or positive findings: General appearance:Adequately nourished; no acute distress , increased work of breathing is present.   Lymphatic: No lymphadenopathy about the head, neck, axilla . Eyes: No conjunctival inflammation or lid  edema is present. There is no scleral icterus. Ears:  External ear exam shows no significant lesions or deformities.   Nose:  External nasal examination shows no deformity or inflammation. Nasal mucosa are pink and moist without lesions ,exudates Oral exam: lips and gums are healthy appearing.There is no oropharyngeal erythema or exudate . Neck:  No thyromegaly, masses, tenderness noted.    Heart:  Normal rate and regular rhythm. S1 and S2 normal without gallop, murmur, click, rub .  Lungs:Chest clear to auscultation without wheezes, rhonchi,rales , rubs. Abdomen:Bowel sounds are normal. Abdomen is soft and nontender with no organomegaly, hernias,masses. GU: deferred  Extremities:  No cyanosis, clubbing,edema  Neurologic exam : Cn 2-7 intact Strength equal  in upper & lower extremities Balance,Rhomberg,finger to nose testing could not be completed due to clinical state Deep tendon reflexes are equal Skin: Warm & dry w/o tenting. No significant lesions or rash.  See summary under each active problem in the Problem List with associated updated therapeutic plan

## 2017-06-20 NOTE — Progress Notes (Signed)
This is a nursing facility follow up for acute dyspnea & fever.  Interim medical record and care since last [place] visit was updated with review of diagnostic studies and change in clinical status since last visit were documented.  HPI: I was called emergently for acute weakness, the patient was supine and was too weak to sit up. She was found to have O2 sats of 74% even after treatment of bronchodilators. 3 L of nasal oxygen was applied. Gradually her O2 sats rose to 84% and finally 91% on 3 L. Temp was documented as 100.8 F. The patient is hard of hearing and seems somewhat confused. She states that she was "just sleepy" and "couldn't get up". She describes some frontal headache and cough with production of brown-white sputum. The patient was seen yesterday 8/27 for chronic diastolic congestive heart failure. She had gained 6.4 pounds since the beginning of the month, weight was 253.2 pounds. Lasix was increased to 60 mg twice day for 5 days and potassium increased to 20 mEq daily for 5 days. She had been seen by pulmonology 06/08/17 for her Gold COPD D.  She had been admitted 7/20-7/24 for congestive heart failure complicated by COPD exacerbation. She was placed on a prednisone taper at that time with dose of 60 mg daily @ discharge. O2 sat goals were established as 88-92 percent & she was to be scheduled for a split-night sleep study to evaluate for obstructive sleep apnea/obesity hypoventilation syndrome. She was to follow-up with Dr. Tami Ribas in 3 months.  Review of systems: As noted she seems somewhat confused, but she gave the date as August 24 (rather than 28), 2018. She states that she has had some numbness and tingling in her extremities but could not elaborate. There was no definite neurologic or cardiac prodrome prior to the acute symptoms of weakness.  Eyes: No redness, discharge, pain, vision change ENT/mouth: No nasal congestion,  purulent discharge,  earache,change in hearing ,sore throat  Cardiovascular: No chest pain, palpitations,paroxysmal nocturnal dyspnea, claudication, edema  Respiratory: No hemoptysis Gastrointestinal: No heartburn,dysphagia,abdominal pain, nausea / vomiting,rectal bleeding, melena,change in bowels Genitourinary: No dysuria,hematuria, pyuria,  incontinence, nocturia Musculoskeletal: No joint stiffness, joint swelling, weakness,pain Dermatologic: No rash, pruritus, change in appearance of skin Neurologic: No dizziness,headache,syncope, seizures Psychiatric: No significant anxiety , depression, insomnia, anorexia Endocrine: No change in hair/skin/ nails, excessive thirst, excessive hunger, excessive urination  Hematologic/lymphatic: No significant bruising, lymphadenopathy,abnormal bleeding Allergy/immunology: No itchy/ watery eyes, significant sneezing, urticaria, angioedema  Physical exam:  Pertinent or positive findings: She appears chronically ill with stigmata of chronic bronchitis. As noted she is hard of hearing. Although she was essentially oriented as noted, her responses are slow without significant elaboration. She has only the upper denture. Heart sounds are somewhat distant and irregular. She has diffuse homogenous expiratory musical rhonchi which are low grade. She exhibits a rattly, nonproductive cough. Abdomen is protuberant but nontender. She has nonpitting edema in extremities. Pedal pulses are decreased. She has flexion contractures of the toes. Strength is decreased but seems symmetric in extremities to opposition.  General appearance:Obese.   Lymphatic: No lymphadenopathy about the head, neck, axilla . Eyes: No conjunctival inflammation or lid edema is present. There is no scleral icterus. Ears:  External ear exam shows no significant lesions or deformities.   Nose:  External nasal examination shows no deformity or inflammation. Nasal mucosa are pink and moist without lesions ,exudates Oral exam:  lips and gums are healthy appearing.There is no oropharyngeal  erythema or exudate . Neck:  No thyromegaly, masses, tenderness noted.    Heart:  No murmur, click, rub .  Lungs:without wheezes, rales , rubs. Abdomen:Bowel sounds are normal. Abdomen is soft and nontender with no organomegaly, hernias,masses. GU: deferred  Extremities:  No cyanosis on O2  Neurologic exam : Balance,Rhomberg,finger to nose testing could not be completed due to clinical state Skin: Warm & dry w/o tenting. No significant lesions or rash.  See summary under each active problem in the Problem List with associated updated therapeutic plan

## 2017-07-10 ENCOUNTER — Encounter: Payer: Self-pay | Admitting: Adult Health

## 2017-07-10 ENCOUNTER — Non-Acute Institutional Stay (SKILLED_NURSING_FACILITY): Payer: Medicare Other | Admitting: Adult Health

## 2017-07-10 DIAGNOSIS — J449 Chronic obstructive pulmonary disease, unspecified: Secondary | ICD-10-CM | POA: Diagnosis not present

## 2017-07-10 DIAGNOSIS — I5032 Chronic diastolic (congestive) heart failure: Secondary | ICD-10-CM

## 2017-07-10 DIAGNOSIS — IMO0002 Reserved for concepts with insufficient information to code with codable children: Secondary | ICD-10-CM

## 2017-07-10 DIAGNOSIS — F339 Major depressive disorder, recurrent, unspecified: Secondary | ICD-10-CM | POA: Diagnosis not present

## 2017-07-10 DIAGNOSIS — E1351 Other specified diabetes mellitus with diabetic peripheral angiopathy without gangrene: Secondary | ICD-10-CM

## 2017-07-10 DIAGNOSIS — E034 Atrophy of thyroid (acquired): Secondary | ICD-10-CM

## 2017-07-10 DIAGNOSIS — E1365 Other specified diabetes mellitus with hyperglycemia: Secondary | ICD-10-CM

## 2017-07-10 DIAGNOSIS — J9611 Chronic respiratory failure with hypoxia: Secondary | ICD-10-CM

## 2017-07-10 NOTE — Progress Notes (Signed)
DATE:  07/10/2017   MRN:  [id number]  BIRTHDAY: May 01, 1932  Facility:  Nursing Home Location:  [facility] Room Number: 227-B  LEVEL OF CARE:  SNF (31)  Contact Information    Name Relation Home Work [place] Grandaughter [phone]  [phone]       Code Status History    Date Active Date Inactive Code Status Order ID Comments User Context   05/12/2017  7:53 PM 05/16/2017 11:31 PM DNR [id number]  Velvet Bathe, MD Inpatient   05/12/2017  5:44 PM 05/12/2017  7:53 PM DNR [id number]  Velvet Bathe, MD ED   04/05/2017  8:59 PM 04/10/2017  3:34 PM DNR [id number]  Louellen Molder, MD Inpatient   12/29/2016 12:47 AM 01/01/2017  4:25 PM DNR [id number]  Etta Quill, DO ED   09/18/2016  5:43 AM 09/20/2016  8:43 PM DNR [id number]  Oswald Hillock, MD Inpatient   08/26/2016  3:06 PM 09/18/2016  2:20 AM DNR [id number]  Lauree Chandler, NP Outpatient   10/25/2014  1:38 AM 10/31/2014 10:19 PM Full Code [id number]  Rise Patience, MD Inpatient   02/22/2014  4:18 PM 02/25/2014  9:53 PM Full Code [id number]  Louellen Molder, MD Inpatient   01/18/2014  8:27 PM 01/27/2014 10:38 PM Full Code [id number]  Eugenie Filler, MD Inpatient    Questions for Most Recent Historical Code Status (Order [id number])    Question Answer Comment   In the event of cardiac or respiratory ARREST Do not call a "code blue"    In the event of cardiac or respiratory ARREST Do not perform Intubation, CPR, defibrillation or ACLS    In the event of cardiac or respiratory ARREST Use medication by any route, position, wound care, and other measures to relive pain and suffering. May use oxygen, suction and manual treatment of airway obstruction as needed for comfort.         Advance Directive Documentation     Most Recent Value  Type of Advance Directive  Out of facility DNR (pink MOST or yellow form)  Pre-existing out of facility DNR order (yellow form or pink MOST form)  -  "MOST" Form in  Place?  -       Chief Complaint  Patient presents with  . Medical Management of Chronic Issues    Routine visit    HISTORY OF PRESENT ILLNESS:  This is an 54-YO female seen for a routine visit.  She is a long-term care resident of [facility].  She has a PMH of chronic hypercapnic respiratory failure secondary to COPD (on continuous O2 at 3 L/min via [place]) chronic diastolic CHF, peripheral neuropathy, HTN, and depression. She was seen today in her room. She was noted to have productive cough with yellowish whitish phlegm. Chest x-ray result showed no acute pulmonary infiltrate. No fever reported. She was recently discharged from PT services and Prednisone was tapered off. She has recently completed Z-pack 2 weeks ago.    PAST MEDICAL HISTORY:  Past Medical History:  Diagnosis Date  . Anxiety state 02/25/2015  . Aortic atherosclerosis ([place]) 12/31/2016  . Aortic atherosclerosis ([place]) 12/31/2016  . Breast CA ([place])   . CKD (chronic kidney disease) stage 3, GFR 30-59 ml/min 12/15/2014  . COPD (chronic obstructive pulmonary disease) ([place])   . Diastolic CHF ([place]) 12/25/4354  . DM (diabetes mellitus), secondary, uncontrolled, with peripheral vascular complications ([place]) 8/61/6837  . GERD without esophagitis 01/19/2016  .  Hordeolum externum (stye)    07/01/2016  . HTN (hypertension) 01/18/2014  . Hypertension   . Hypothyroidism 01/18/2014  . Pneumonia   . Recurrent major depression ([place]) 10/03/2016  . Sigmoid diverticulosis   . Spondylosis, cervical, with myelopathy 04/11/2016     CURRENT MEDICATIONS: Reviewed  Patient's Medications  New Prescriptions   No medications on file  Previous Medications   ACETAMINOPHEN (TYLENOL) 500 MG TABLET    Take 500 mg by mouth every 6 (six) hours as needed (pain).    ALBUTEROL (PROVENTIL HFA;VENTOLIN HFA) 108 (90 BASE) MCG/ACT INHALER    Inhale 2 puffs into the lungs every 6 (six) hours as needed for wheezing or shortness of breath.    ARFORMOTEROL (BROVANA) 15 MCG/2ML NEBU    Take 2 mLs (15 mcg total) by nebulization 2 (two) times daily.   ARTIFICIAL TEAR SOLUTION OP    Place 1 drop into both eyes 4 (four) times daily as needed (for dry eyes).   ASPIRIN 81 MG CHEWABLE TABLET    Chew 1 tablet (81 mg total) by mouth daily.   BENZOCAINE (ORAJEL) 10 % MUCOSAL GEL    Use as directed 1 application in the mouth or throat 3 (three) times daily before meals.   BISACODYL (DULCOLAX) 10 MG SUPPOSITORY    Place 10 mg rectally once as needed for mild constipation.    BUDESONIDE (PULMICORT) 0.25 MG/2ML NEBULIZER SOLUTION    Take 0.25 mg by nebulization 2 (two) times daily.   CARVEDILOL (COREG) 3.125 MG TABLET    Take 3.125 mg by mouth See admin instructions. Take 1 tablet (3.125 mg) by mouth twice daily (8am and 4pm) - Hold for HR less than 60 and SBP less than 100.   CHOLECALCIFEROL (VITAMIN D) 1000 UNITS TABLET    Take 1,000 Units by mouth daily. Reported on 04/11/2016   CYCLOSPORINE (RESTASIS) 0.05 % OPHTHALMIC EMULSION    Place 1 drop into both eyes 2 (two) times daily. Must be given at least 15 minutes after other eye drops   DULOXETINE (CYMBALTA) 60 MG CAPSULE    Take 60 mg by mouth daily.    FUROSEMIDE (LASIX) 40 MG TABLET    Take 1 tablet (40 mg total) by mouth 2 (two) times daily.   GABAPENTIN (NEURONTIN) 300 MG CAPSULE    Take 300 mg by mouth 3 (three) times daily.    INSULIN GLARGINE (LANTUS) 100 UNIT/ML SOPN    Inject 12 Units into the skin at bedtime.   IPRATROPIUM-ALBUTEROL (DUONEB) 0.5-2.5 (3) MG/3ML SOLN    Take 3 mLs by nebulization every 4 (four) hours as needed. FOR COPD   LEVOTHYROXINE (SYNTHROID, LEVOTHROID) 75 MCG TABLET    Take 75 mcg by mouth daily before breakfast.    LOPERAMIDE (IMODIUM A-D) 2 MG TABLET    Take 4 mg by mouth daily as needed for diarrhea or loose stools.   LORATADINE (CLARITIN) 10 MG TABLET    Take 10 mg by mouth daily.   MAGNESIUM HYDROXIDE (MILK OF MAGNESIA) 400 MG/5ML SUSPENSION    Take 30 mLs by  mouth once as needed for mild constipation.    MELATONIN 3 MG TABS    Take 3 mg by mouth at bedtime.    METFORMIN (GLUCOPHAGE) 500 MG TABLET    Take 500 mg by mouth 2 (two) times daily after a meal.    NITROGLYCERIN (NITROSTAT) 0.4 MG SL TABLET    Place 0.4 mg under the tongue every 5 (five) minutes as needed for  chest pain (call md if no relief after 3 doses).    OXYGEN    Inhale 3 L into the lungs continuous. FOR HYPOXIA   PANTOPRAZOLE (PROTONIX) 40 MG TABLET    Take 40 mg by mouth daily at 6 (six) AM.    POLYETHYLENE GLYCOL (MIRALAX / GLYCOLAX) PACKET    Take 17 g by mouth daily.   POTASSIUM CHLORIDE (K-DUR,KLOR-CON) 10 MEQ TABLET    Take 10 mEq by mouth daily.   PROPYLENE GLYCOL (SYSTANE BALANCE) 0.6 % SOLN    Place 1 drop into both eyes 2 (two) times daily.   SIMETHICONE (MYLICON) 80 MG CHEWABLE TABLET    Chew 1 tablet (80 mg total) by mouth 4 (four) times daily as needed for flatulence.   TRAMADOL (ULTRAM) 50 MG TABLET    Take 1 tablet (50 mg total) by mouth every 8 (eight) hours as needed for severe pain.   TRAZODONE (DESYREL) 50 MG TABLET    Take 50 mg by mouth at bedtime.   VALSARTAN (DIOVAN) 40 MG TABLET    Take 20 mg by mouth daily. Take 1/2 tablet to = 20 mg  Modified Medications   No medications on file  Discontinued Medications   No medications on file     No Known Allergies   REVIEW OF SYSTEMS:  GENERAL: no change in appetite, no fatigue, no weight changes, no fever, chills or weakness MOUTH and THROAT: Denies oral discomfort, gingival pain or bleeding, pain from teeth or hoarseness   RESPIRATORY: no SOB, DOE, wheezing, hemoptysis, +cough CARDIAC: no chest pain or palpitations GI: no abdominal pain, diarrhea, constipation, heart burn, nausea or vomiting GU: Denies dysuria, frequency, hematuria, incontinence, or discharge PSYCHIATRIC: Denies feeling of depression or anxiety. No report of hallucinations, insomnia, paranoia, or agitation     PHYSICAL  EXAMINATION  GENERAL APPEARANCE: Well nourished. In no acute distress. Morbidly obese SKIN:  Skin is warm and dry.  MOUTH and THROAT: Lips are without lesions. Oral mucosa is moist and without lesions. Tongue is normal in shape, size, and color and without lesions NECK: supple, trachea midline, no neck masses, no thyroid tenderness, no thyromegaly LYMPHATICS: no LAN in the neck, no supraclavicular LAN RESPIRATORY: breathing is even & unlabored, BS CTAB CARDIAC: Irregular heart rate, no murmur,no extra heart sounds, BLE trace  edema GI: abdomen soft, normal BS, no masses, no tenderness, no hepatomegaly, no splenomegaly EXTREMITIES:  Able to move X 4 extremities PSYCHIATRIC: Alert and oriented X 3. Affect and behavior are appropriate    LABS/RADIOLOGY: Labs reviewed: Basic Metabolic Panel:  Recent Labs  09/19/16 0845 09/20/16 0525  05/14/17 0543 05/15/17 0322 05/16/17 4008 05/17/17  NA  --  139  < > 140 139 144 144  K  --  3.5  < > 4.5 4.3 3.8 4.7  CL  --  93*  < > 99* 93* 92*  --   CO2  --  34*  < > 33* 37* 40*  --   GLUCOSE  --  97  < > 177* 244* 130*  --   BUN  --  25*  < > 16 20 22* 26*  CREATININE  --  0.98  < > 0.72 0.80 0.73 0.6  CALCIUM  --  9.3  < > 8.6* 8.8* 8.7*  --   MG 2.2 2.1  --   --   --   --   --   PHOS 3.8 4.1  --   --   --   --   --   < > =  values in this interval not displayed. Liver Function Tests:  Recent Labs  04/08/17 0342 04/09/17 0740 05/14/17 0543  AST 15 24 14*  ALT 16 17 9*  ALKPHOS 69 66 46  BILITOT 0.6 1.1 0.6  PROT 6.2* 6.0* 5.5*  ALBUMIN 2.9* 2.8* 2.8*   CBC:  Recent Labs  04/05/17 1615  05/10/17 05/12/17 1541 05/13/17 0402 05/13/17 1430  WBC 12.4*  < > 7.6 9.4 5.1 8.7  NEUTROABS 11.0*  --  5  --   --  7.6  HGB 12.6  < > 10.9* 10.8* 10.4* 10.7*  HCT 40.4  < > 33* 36.4 35.2* 35.5*  MCV 100.2*  < >  --  104.0* 101.1* 101.4*  PLT 239  < > 275 349 318 380  < > = values in this interval not displayed. CBG:  Recent Labs   05/16/17 0758 05/16/17 1213 05/16/17 1748  GLUCAP 104* 145* 250*      ASSESSMENT/PLAN:  1. Chronic diastolic CHF (congestive heart failure) (HCC) - continue Lasix 40 mg BID, Valsartan 40 mg give 1/2 tab = 20 mg daily, Carvedilol 3.125, 1500 ml fluid restriction   2. Chronic obstructive pulmonary disease, unspecified COPD type ([place]) - continue O2 @ 3L/min via [place] continuously, continue Ipratropium-Albuterol PRN, Budesonide neb BID, Brovana neb BID   3. Chronic respiratory failure with hypoxia (HCC) - continue O2 @ 3L/min via [place] continuously, Ipratropium-Albuterol PRN, Budesonide neb BID, Brovana neb BID   4. Recurrent major depressive disorder, remission status unspecified (HCC) - stable, continue Duloxetine 60 mg daily   5. DM (diabetes mellitus), secondary, uncontrolled, with peripheral vascular complications (HCC) -  Well-controlled, continue Lantus 100 units/ml give 12 units SQ Q HS, Metformin 500 mg BID Lab Results  Component Value Date   HGBA1C 8.7 03/17/2017     6. Hypothyroidism due to acquired atrophy of thyroid - continue Synthroid 75 mcg daily Lab Results  Component Value Date   TSH 2.95 01/26/2017        Goals of care:  Long-term care     Mirielle Byrum C. Carney - NP    [employer] [phone]

## 2017-07-26 ENCOUNTER — Non-Acute Institutional Stay (SKILLED_NURSING_FACILITY): Payer: Medicare Other | Admitting: Adult Health

## 2017-07-26 ENCOUNTER — Encounter: Payer: Self-pay | Admitting: Adult Health

## 2017-07-26 DIAGNOSIS — S2231XA Fracture of one rib, right side, initial encounter for closed fracture: Secondary | ICD-10-CM | POA: Diagnosis not present

## 2017-07-26 NOTE — Progress Notes (Signed)
DATE:  07/26/2017   MRN:  [id number]  BIRTHDAY: May 17, 1932  Facility:  Nursing Home Location:  [facility] Room Number: 227-B  LEVEL OF CARE:  SNF (31)  Contact Information    Name Relation Home Work [place] Grandaughter [phone]  [phone]       Code Status History    Date Active Date Inactive Code Status Order ID Comments User Context   05/12/2017  7:53 PM 05/16/2017 11:31 PM DNR [id number]  Velvet Bathe, MD Inpatient   05/12/2017  5:44 PM 05/12/2017  7:53 PM DNR [id number]  Velvet Bathe, MD ED   04/05/2017  8:59 PM 04/10/2017  3:34 PM DNR [id number]  Louellen Molder, MD Inpatient   12/29/2016 12:47 AM 01/01/2017  4:25 PM DNR [id number]  Etta Quill, DO ED   09/18/2016  5:43 AM 09/20/2016  8:43 PM DNR [id number]  Oswald Hillock, MD Inpatient   08/26/2016  3:06 PM 09/18/2016  2:20 AM DNR [id number]  Lauree Chandler, NP Outpatient   10/25/2014  1:38 AM 10/31/2014 10:19 PM Full Code [id number]  Rise Patience, MD Inpatient   02/22/2014  4:18 PM 02/25/2014  9:53 PM Full Code [id number]  Louellen Molder, MD Inpatient   01/18/2014  8:27 PM 01/27/2014 10:38 PM Full Code [id number]  Eugenie Filler, MD Inpatient    Questions for Most Recent Historical Code Status (Order [id number])    Question Answer Comment   In the event of cardiac or respiratory ARREST Do not call a "code blue"    In the event of cardiac or respiratory ARREST Do not perform Intubation, CPR, defibrillation or ACLS    In the event of cardiac or respiratory ARREST Use medication by any route, position, wound care, and other measures to relive pain and suffering. May use oxygen, suction and manual treatment of airway obstruction as needed for comfort.         Advance Directive Documentation     Most Recent Value  Type of Advance Directive  Out of facility DNR (pink MOST or yellow form)  Pre-existing out of facility DNR order (yellow form or pink MOST form)  -  "MOST" Form in  Place?  -       Chief Complaint  Patient presents with  . Acute Visit    Rib fracture on x-ray    HISTORY OF PRESENT ILLNESS:  This is an 71-YO female seen for an acute visit.  She complained of left rib pain after leaning over the arm of her wheelchair to pick something up.  There was no bruising demonstrated but chest x-ray revealed second left rib fracture.  She is a long-term care resident of [facility]. She has taken steroid on several occasions due to COPD, menopausal and has osteoporotic bones. These factors makes her prone to fractures. She has a PMH of chronic hypercapnic respiratory failure secondary to COPD (on continuous O2 at 3L/min via [place]), chronic diastolic CHF, peripheral neuropathy, HTN, and depression.     PAST MEDICAL HISTORY:  Past Medical History:  Diagnosis Date  . Anxiety state 02/25/2015  . Aortic atherosclerosis ([place]) 12/31/2016  . Aortic atherosclerosis ([place]) 12/31/2016  . Breast CA ([place])   . CKD (chronic kidney disease) stage 3, GFR 30-59 ml/min (HCC) 12/15/2014  . COPD (chronic obstructive pulmonary disease) ([place])   . Diastolic CHF ([place]) 05/28/2777  . DM (diabetes mellitus), secondary, uncontrolled, with peripheral vascular complications ([place]) 2/42/3536  . GERD  without esophagitis 01/19/2016  . Hordeolum externum (stye)    07/01/2016  . HTN (hypertension) 01/18/2014  . Hypertension   . Hypothyroidism 01/18/2014  . Pneumonia   . Recurrent major depression ([place]) 10/03/2016  . Sigmoid diverticulosis   . Spondylosis, cervical, with myelopathy 04/11/2016     CURRENT MEDICATIONS: Reviewed  Patient's Medications  New Prescriptions   No medications on file  Previous Medications   ACETAMINOPHEN (TYLENOL) 500 MG TABLET    Take 500 mg by mouth every 6 (six) hours as needed (pain).    ALBUTEROL (PROVENTIL HFA;VENTOLIN HFA) 108 (90 BASE) MCG/ACT INHALER    Inhale 2 puffs into the lungs every 6 (six) hours as needed for wheezing or shortness of  breath.   ARFORMOTEROL (BROVANA) 15 MCG/2ML NEBU    Take 2 mLs (15 mcg total) by nebulization 2 (two) times daily.   ARTIFICIAL TEAR SOLUTION OP    Place 1 drop into both eyes 4 (four) times daily as needed (for dry eyes).   ASPIRIN 81 MG CHEWABLE TABLET    Chew 1 tablet (81 mg total) by mouth daily.   BENZOCAINE (ORAJEL) 10 % MUCOSAL GEL    Use as directed 1 application in the mouth or throat 3 (three) times daily before meals.   BISACODYL (DULCOLAX) 10 MG SUPPOSITORY    Place 10 mg rectally once as needed for mild constipation.    BUDESONIDE (PULMICORT) 0.25 MG/2ML NEBULIZER SOLUTION    Take 0.25 mg by nebulization 2 (two) times daily.   CARVEDILOL (COREG) 3.125 MG TABLET    Take 3.125 mg by mouth See admin instructions. Take 1 tablet (3.125 mg) by mouth twice daily (8am and 4pm) - Hold for HR less than 60 and SBP less than 100.   CHOLECALCIFEROL (VITAMIN D) 1000 UNITS TABLET    Take 1,000 Units by mouth daily. Reported on 04/11/2016   CYCLOSPORINE (RESTASIS) 0.05 % OPHTHALMIC EMULSION    Place 1 drop into both eyes 2 (two) times daily. Must be given at least 15 minutes after other eye drops   DULOXETINE (CYMBALTA) 60 MG CAPSULE    Take 60 mg by mouth daily.    FUROSEMIDE (LASIX) 40 MG TABLET    Take 1 tablet (40 mg total) by mouth 2 (two) times daily.   GABAPENTIN (NEURONTIN) 300 MG CAPSULE    Take 300 mg by mouth 3 (three) times daily.    INSULIN GLARGINE (LANTUS) 100 UNIT/ML SOPN    Inject 12 Units into the skin at bedtime.   IPRATROPIUM-ALBUTEROL (DUONEB) 0.5-2.5 (3) MG/3ML SOLN    Take 3 mLs by nebulization every 4 (four) hours as needed. FOR COPD   LEVOTHYROXINE (SYNTHROID, LEVOTHROID) 75 MCG TABLET    Take 75 mcg by mouth daily before breakfast.    LORATADINE (CLARITIN) 10 MG TABLET    Take 10 mg by mouth daily.   MAGNESIUM HYDROXIDE (MILK OF MAGNESIA) 400 MG/5ML SUSPENSION    Take 30 mLs by mouth once as needed for mild constipation.    MELATONIN 3 MG TABS    Take 3 mg by mouth at bedtime.     METFORMIN (GLUCOPHAGE) 500 MG TABLET    Take 500 mg by mouth 2 (two) times daily after a meal.    NITROGLYCERIN (NITROSTAT) 0.4 MG SL TABLET    Place 0.4 mg under the tongue every 5 (five) minutes as needed for chest pain (call md if no relief after 3 doses).    OXYGEN    Inhale 3  L into the lungs continuous. FOR HYPOXIA   PANTOPRAZOLE (PROTONIX) 40 MG TABLET    Take 40 mg by mouth daily at 6 (six) AM.    POLYETHYLENE GLYCOL (MIRALAX / GLYCOLAX) PACKET    Take 17 g by mouth daily.   POTASSIUM CHLORIDE (K-DUR,KLOR-CON) 10 MEQ TABLET    Take 10 mEq by mouth daily.   PROPYLENE GLYCOL (SYSTANE BALANCE) 0.6 % SOLN    Place 1 drop into both eyes 2 (two) times daily.   SENNOSIDES-DOCUSATE SODIUM (SENOKOT-S) 8.6-50 MG TABLET    Take 2 tablets by mouth 2 (two) times daily.   SIMETHICONE (MYLICON) 80 MG CHEWABLE TABLET    Chew 1 tablet (80 mg total) by mouth 4 (four) times daily as needed for flatulence.   TRAMADOL (ULTRAM) 50 MG TABLET    Take 1 tablet (50 mg total) by mouth every 8 (eight) hours as needed for severe pain.   TRAZODONE (DESYREL) 50 MG TABLET    Take 50 mg by mouth at bedtime.   VALSARTAN (DIOVAN) 40 MG TABLET    Take 20 mg by mouth daily. Take 1/2 tablet to = 20 mg  Modified Medications   No medications on file  Discontinued Medications   LOPERAMIDE (IMODIUM A-D) 2 MG TABLET    Take 4 mg by mouth daily as needed for diarrhea or loose stools.     No Known Allergies   REVIEW OF SYSTEMS:  GENERAL: no change in appetite, no fatigue, no weight changes, no fever, chills or weakness MOUTH and THROAT: Denies oral discomfort, gingival pain or bleeding, pain from teeth or hoarseness   RESPIRATORY: no cough, SOB, DOE, wheezing, hemoptysis CARDIAC: Right rib cage tenderness GI: no abdominal pain, diarrhea, constipation, heart burn or vomiting GU: Denies dysuria, frequency, hematuria, incontinence, or discharge PSYCHIATRIC: Denies feeling of depression or anxiety. No report of  hallucinations, insomnia, paranoia, or agitation    PHYSICAL EXAMINATION  GENERAL APPEARANCE: Well nourished. In no acute distress. Morbidly obese SKIN:  Skin is warm and dry. No bruising noted. MOUTH and THROAT: Lips are without lesions. Oral mucosa is moist and without lesions. Tongue is normal in shape, size, and color and without lesions RESPIRATORY: breathing is even & unlabored, BS CTAB, on O2 @ 3L/min via [place] continuously, right chest tender upon palpation CARDIAC: RRR, no murmur,no extra heart sounds, no edema GI: abdomen soft, normal BS, no masses, no tenderness, no hepatomegaly, no splenomegaly EXTREMITIES:  Able to move X 4 extremities PSYCHIATRIC: Alert and oriented X 3. Affect and behavior are appropriate   LABS/RADIOLOGY: Labs reviewed: Basic Metabolic Panel:  Recent Labs  09/19/16 0845 09/20/16 0525  05/14/17 0543 05/15/17 0322 05/16/17 5638 05/17/17  NA  --  139  < > 140 139 144 144  K  --  3.5  < > 4.5 4.3 3.8 4.7  CL  --  93*  < > 99* 93* 92*  --   CO2  --  34*  < > 33* 37* 40*  --   GLUCOSE  --  97  < > 177* 244* 130*  --   BUN  --  25*  < > 16 20 22* 26*  CREATININE  --  0.98  < > 0.72 0.80 0.73 0.6  CALCIUM  --  9.3  < > 8.6* 8.8* 8.7*  --   MG 2.2 2.1  --   --   --   --   --   PHOS 3.8 4.1  --   --   --   --   --   < > =  values in this interval not displayed. Liver Function Tests:  Recent Labs  04/08/17 0342 04/09/17 0740 05/14/17 0543  AST 15 24 14*  ALT 16 17 9*  ALKPHOS 69 66 46  BILITOT 0.6 1.1 0.6  PROT 6.2* 6.0* 5.5*  ALBUMIN 2.9* 2.8* 2.8*   CBC:  Recent Labs  04/05/17 1615  05/10/17 05/12/17 1541 05/13/17 0402 05/13/17 1430  WBC 12.4*  < > 7.6 9.4 5.1 8.7  NEUTROABS 11.0*  --  5  --   --  7.6  HGB 12.6  < > 10.9* 10.8* 10.4* 10.7*  HCT 40.4  < > 33* 36.4 35.2* 35.5*  MCV 100.2*  < >  --  104.0* 101.1* 101.4*  PLT 239  < > 275 349 318 380  < > = values in this interval not displayed. CBG:  Recent Labs  05/16/17 0758  05/16/17 1213 05/16/17 1748  GLUCAP 104* 145* 250*      ASSESSMENT/PLAN:  1. Closed fracture of one rib of right side, initial encounter - she has been on chronic steroid use, menopausal and osteoporotic bones? making her prone to fractures, no elevation in total protein nor hypercalcemia, will continue Tramadol 50 mg Q 8 hours PRN and Acetaminophen 500 mg Q 6 hours PRN for pain, start Vitamin D3 2,000 units daily and Caltrate 600 mg 1 tab daily     Prakriti Carignan C. [place] - NP   [employer] [phone]

## 2017-07-27 LAB — HEPATIC FUNCTION PANEL
ALT: 13 (ref 7–35)
AST: 18 (ref 13–35)
Alkaline Phosphatase: 69 (ref 25–125)
BILIRUBIN, TOTAL: 0.5

## 2017-07-27 LAB — CBC AND DIFFERENTIAL
HEMATOCRIT: 36 (ref 36–46)
HEMOGLOBIN: 11.4 — AB (ref 12.0–16.0)
NEUTROS ABS: 5
PLATELETS: 355 (ref 150–399)
WBC: 7.5

## 2017-07-27 LAB — BASIC METABOLIC PANEL
BUN: 15 (ref 4–21)
Creatinine: 0.9 (ref 0.5–1.1)
GLUCOSE: 52
Potassium: 3.9 (ref 3.4–5.3)
Sodium: 146 (ref 137–147)

## 2017-08-01 ENCOUNTER — Encounter: Payer: Self-pay | Admitting: Adult Health

## 2017-08-01 ENCOUNTER — Non-Acute Institutional Stay (SKILLED_NURSING_FACILITY): Payer: Medicare Other | Admitting: Adult Health

## 2017-08-01 DIAGNOSIS — E1351 Other specified diabetes mellitus with diabetic peripheral angiopathy without gangrene: Secondary | ICD-10-CM | POA: Diagnosis not present

## 2017-08-01 DIAGNOSIS — J449 Chronic obstructive pulmonary disease, unspecified: Secondary | ICD-10-CM

## 2017-08-01 DIAGNOSIS — I5032 Chronic diastolic (congestive) heart failure: Secondary | ICD-10-CM | POA: Diagnosis not present

## 2017-08-01 DIAGNOSIS — G4700 Insomnia, unspecified: Secondary | ICD-10-CM | POA: Diagnosis not present

## 2017-08-01 DIAGNOSIS — E034 Atrophy of thyroid (acquired): Secondary | ICD-10-CM

## 2017-08-01 DIAGNOSIS — S2231XA Fracture of one rib, right side, initial encounter for closed fracture: Secondary | ICD-10-CM

## 2017-08-01 DIAGNOSIS — K219 Gastro-esophageal reflux disease without esophagitis: Secondary | ICD-10-CM

## 2017-08-01 DIAGNOSIS — G629 Polyneuropathy, unspecified: Secondary | ICD-10-CM

## 2017-08-01 DIAGNOSIS — F339 Major depressive disorder, recurrent, unspecified: Secondary | ICD-10-CM | POA: Diagnosis not present

## 2017-08-01 DIAGNOSIS — J9611 Chronic respiratory failure with hypoxia: Secondary | ICD-10-CM | POA: Diagnosis not present

## 2017-08-01 DIAGNOSIS — IMO0002 Reserved for concepts with insufficient information to code with codable children: Secondary | ICD-10-CM

## 2017-08-01 DIAGNOSIS — E1365 Other specified diabetes mellitus with hyperglycemia: Secondary | ICD-10-CM | POA: Diagnosis not present

## 2017-08-01 NOTE — Progress Notes (Signed)
DATE:  08/01/2017   MRN:  [id number]  BIRTHDAY: 10/06/1932  Facility:  Nursing Home Location:  [facility]    LEVEL OF CARE:  SNF (31)  Contact Information    Name Relation Home Work Mobile   [place] Grandaughter [phone]  [phone]       Code Status History    Date Active Date Inactive Code Status Order ID Comments User Context   05/12/2017  7:53 PM 05/16/2017 11:31 PM DNR [id number]  Velvet Bathe, MD Inpatient   05/12/2017  5:44 PM 05/12/2017  7:53 PM DNR [id number]  Velvet Bathe, MD ED   04/05/2017  8:59 PM 04/10/2017  3:34 PM DNR [id number]  Louellen Molder, MD Inpatient   12/29/2016 12:47 AM 01/01/2017  4:25 PM DNR [id number]  Etta Quill, DO ED   09/18/2016  5:43 AM 09/20/2016  8:43 PM DNR [id number]  Oswald Hillock, MD Inpatient   08/26/2016  3:06 PM 09/18/2016  2:20 AM DNR [id number]  Lauree Chandler, NP Outpatient   10/25/2014  1:38 AM 10/31/2014 10:19 PM Full Code [id number]  Rise Patience, MD Inpatient   02/22/2014  4:18 PM 02/25/2014  9:53 PM Full Code [id number]  Louellen Molder, MD Inpatient   01/18/2014  8:27 PM 01/27/2014 10:38 PM Full Code [id number]  Eugenie Filler, MD Inpatient    Questions for Most Recent Historical Code Status (Order [id number])    Question Answer Comment   In the event of cardiac or respiratory ARREST Do not call a "code blue"    In the event of cardiac or respiratory ARREST Do not perform Intubation, CPR, defibrillation or ACLS    In the event of cardiac or respiratory ARREST Use medication by any route, position, wound care, and other measures to relive pain and suffering. May use oxygen, suction and manual treatment of airway obstruction as needed for comfort.         Advance Directive Documentation     Most Recent Value  Type of Advance Directive  Out of facility DNR (pink MOST or yellow form)  Pre-existing out of facility DNR order (yellow form or pink MOST form)  -  "MOST" Form in Place?  -       Chief  Complaint  Patient presents with  . Medical Management of Chronic Issues    Routine visit    HISTORY OF PRESENT ILLNESS:  This is an 38-YO female seen for a routine visit.  She is a long-term care resident of [facility].  She has a PMH of chronic hypercapnic respiratory failure secondary to COPD, chronic diastolic CHF, peripheral neuropathy, HTN, depression. She was seen in the room today. She had a recent fracture of her right 2nd rib cage. She said that her pain is better.      PAST MEDICAL HISTORY:  Past Medical History:  Diagnosis Date  . Anxiety state 02/25/2015  . Aortic atherosclerosis ([place]) 12/31/2016  . Aortic atherosclerosis ([place]) 12/31/2016  . Breast CA ([place])   . CKD (chronic kidney disease) stage 3, GFR 30-59 ml/min (HCC) 12/15/2014  . COPD (chronic obstructive pulmonary disease) ([place])   . Diastolic CHF ([place]) 05/26/2548  . DM (diabetes mellitus), secondary, uncontrolled, with peripheral vascular complications ([place]) 06/18/4157  . GERD without esophagitis 01/19/2016  . Hordeolum externum (stye)    07/01/2016  . HTN (hypertension) 01/18/2014  . Hypertension   . Hypothyroidism 01/18/2014  . Pneumonia   . Recurrent major depression ([place]) 10/03/2016  .  Sigmoid diverticulosis   . Spondylosis, cervical, with myelopathy 04/11/2016     CURRENT MEDICATIONS: Reviewed  Patient's Medications  New Prescriptions   No medications on file  Previous Medications   ACETAMINOPHEN (TYLENOL) 500 MG TABLET    Take 500 mg by mouth every 6 (six) hours as needed (pain).    ALBUTEROL (PROVENTIL HFA;VENTOLIN HFA) 108 (90 BASE) MCG/ACT INHALER    Inhale 2 puffs into the lungs every 6 (six) hours as needed for wheezing or shortness of breath.   ARFORMOTEROL (BROVANA) 15 MCG/2ML NEBU    Take 2 mLs (15 mcg total) by nebulization 2 (two) times daily.   ARTIFICIAL TEAR SOLUTION OP    Place 1 drop into both eyes 4 (four) times daily as needed (for dry eyes).   ASPIRIN 81 MG CHEWABLE  TABLET    Chew 1 tablet (81 mg total) by mouth daily.   BENZOCAINE (ORAJEL) 10 % MUCOSAL GEL    Use as directed 1 application in the mouth or throat 3 (three) times daily before meals.   BISACODYL (DULCOLAX) 10 MG SUPPOSITORY    Place 10 mg rectally once as needed for mild constipation.    BUDESONIDE (PULMICORT) 0.25 MG/2ML NEBULIZER SOLUTION    Take 0.25 mg by nebulization 2 (two) times daily.   CALCIUM CARBONATE (OSCAL) 1500 (600 CA) MG TABS TABLET    Take 600 mg of elemental calcium by mouth daily with breakfast.   CARVEDILOL (COREG) 3.125 MG TABLET    Take 3.125 mg by mouth See admin instructions. Take 1 tablet (3.125 mg) by mouth twice daily (8am and 4pm) - Hold for HR less than 60 and SBP less than 100.   CHOLECALCIFEROL (VITAMIN D) 1000 UNITS TABLET    Take 1,000 Units by mouth daily. Reported on 04/11/2016   CYCLOSPORINE (RESTASIS) 0.05 % OPHTHALMIC EMULSION    Place 1 drop into both eyes 2 (two) times daily. Must be given at least 15 minutes after other eye drops   DULOXETINE (CYMBALTA) 60 MG CAPSULE    Take 60 mg by mouth daily.    FUROSEMIDE (LASIX) 40 MG TABLET    Take 1 tablet (40 mg total) by mouth 2 (two) times daily.   GABAPENTIN (NEURONTIN) 300 MG CAPSULE    Take 300 mg by mouth 3 (three) times daily.    INSULIN GLARGINE (LANTUS) 100 UNIT/ML SOPN    Inject 12 Units into the skin at bedtime.   IPRATROPIUM-ALBUTEROL (DUONEB) 0.5-2.5 (3) MG/3ML SOLN    Take 3 mLs by nebulization every 4 (four) hours as needed. FOR COPD   LEVOTHYROXINE (SYNTHROID, LEVOTHROID) 75 MCG TABLET    Take 75 mcg by mouth daily before breakfast.    LORATADINE (CLARITIN) 10 MG TABLET    Take 10 mg by mouth daily.   MAGNESIUM HYDROXIDE (MILK OF MAGNESIA) 400 MG/5ML SUSPENSION    Take 30 mLs by mouth once as needed for mild constipation.    MELATONIN 3 MG TABS    Take 3 mg by mouth at bedtime.    METFORMIN (GLUCOPHAGE) 500 MG TABLET    Take 500 mg by mouth 2 (two) times daily after a meal.    NITROGLYCERIN  (NITROSTAT) 0.4 MG SL TABLET    Place 0.4 mg under the tongue every 5 (five) minutes as needed for chest pain (call md if no relief after 3 doses).    OXYGEN    Inhale 3 L into the lungs continuous. FOR HYPOXIA   PANTOPRAZOLE (PROTONIX) 40 MG  TABLET    Take 40 mg by mouth daily at 6 (six) AM.    POLYETHYLENE GLYCOL (MIRALAX / GLYCOLAX) PACKET    Take 17 g by mouth daily.   POTASSIUM CHLORIDE (K-DUR,KLOR-CON) 10 MEQ TABLET    Take 10 mEq by mouth daily.   PROPYLENE GLYCOL (SYSTANE BALANCE) 0.6 % SOLN    Place 1 drop into both eyes 2 (two) times daily.   SENNOSIDES-DOCUSATE SODIUM (SENOKOT-S) 8.6-50 MG TABLET    Take 2 tablets by mouth 2 (two) times daily.   SIMETHICONE (MYLICON) 80 MG CHEWABLE TABLET    Chew 1 tablet (80 mg total) by mouth 4 (four) times daily as needed for flatulence.   TRAMADOL (ULTRAM) 50 MG TABLET    Take 1 tablet (50 mg total) by mouth every 8 (eight) hours as needed for severe pain.   TRAZODONE (DESYREL) 50 MG TABLET    Take 50 mg by mouth at bedtime.   VALSARTAN (DIOVAN) 40 MG TABLET    Take 20 mg by mouth daily. Take 1/2 tablet to = 20 mg  Modified Medications   No medications on file  Discontinued Medications   No medications on file     No Known Allergies   REVIEW OF SYSTEMS:  GENERAL: no change in appetite, no fatigue, no weight changes, no fever, chills or weakness MOUTH and THROAT: Denies oral discomfort, gingival pain or bleeding  RESPIRATORY: no cough, SOB, DOE, wheezing, hemoptysis CARDIAC: no chest pain, edema or palpitations GI: no abdominal pain, diarrhea, constipation, heart burn, nausea or vomiting GU: Denies dysuria, frequency, hematuria, incontinence, or discharge PSYCHIATRIC: Denies feeling of depression or anxiety. No report of hallucinations, insomnia, paranoia, or agitation     PHYSICAL EXAMINATION  GENERAL APPEARANCE: Well nourished. In no acute distress. Morbidly obese SKIN:  Skin is warm and dry. No bruising  MOUTH and THROAT: Lips  are without lesions. Oral mucosa is moist and without lesions. RESPIRATORY: breathing is even & unlabored, on O2 @ 3L/min via [place] continuously CARDIAC:  no murmur,no extra heart sounds, no edema GI: abdomen soft, normal BS, no masses, no tenderness EXTREMITIES:  Able to move X 4 extremities PSYCHIATRIC: Alert and oriented X 3. Affect and behavior are appropriate    LABS/RADIOLOGY: Labs reviewed: Basic Metabolic Panel:  Recent Labs  09/19/16 0845 09/20/16 0525  05/14/17 0543 05/15/17 0322 05/16/17 7408 05/17/17  NA  --  139  < > 140 139 144 144  K  --  3.5  < > 4.5 4.3 3.8 4.7  CL  --  93*  < > 99* 93* 92*  --   CO2  --  34*  < > 33* 37* 40*  --   GLUCOSE  --  97  < > 177* 244* 130*  --   BUN  --  25*  < > 16 20 22* 26*  CREATININE  --  0.98  < > 0.72 0.80 0.73 0.6  CALCIUM  --  9.3  < > 8.6* 8.8* 8.7*  --   MG 2.2 2.1  --   --   --   --   --   PHOS 3.8 4.1  --   --   --   --   --   < > = values in this interval not displayed. Liver Function Tests:  Recent Labs  04/08/17 0342 04/09/17 0740 05/14/17 0543  AST 15 24 14*  ALT 16 17 9*  ALKPHOS 69 66 46  BILITOT 0.6 1.1 0.6  PROT 6.2* 6.0* 5.5*  ALBUMIN 2.9* 2.8* 2.8*   CBC:  Recent Labs  04/05/17 1615  05/10/17 05/12/17 1541 05/13/17 0402 05/13/17 1430  WBC 12.4*  < > 7.6 9.4 5.1 8.7  NEUTROABS 11.0*  --  5  --   --  7.6  HGB 12.6  < > 10.9* 10.8* 10.4* 10.7*  HCT 40.4  < > 33* 36.4 35.2* 35.5*  MCV 100.2*  < >  --  104.0* 101.1* 101.4*  PLT 239  < > 275 349 318 380  < > = values in this interval not displayed. CBG:  Recent Labs  05/16/17 0758 05/16/17 1213 05/16/17 1748  GLUCAP 104* 145* 250*      ASSESSMENT/PLAN:  1. Chronic diastolic CHF (congestive heart failure) (HCC) - no SOB, continue Lasix 40 mg 1 tablet twice a day, valsartan 40 mg give 1/2 tab = 20 mg daily   2. Chronic respiratory failure with hypoxia (HCC) - no SOB, continue ipratropium albuterol 1 neb every 4 hours when necessary,  albuterol inhaler inhale 2 puffs into lungs every 6 hours when necessary, Brovana 15 g/2 mL one vial via nebulizer twice a day, budesonide 0.25 mg/2 mL one vial via nebulizer twice a day and O2 at 3 L/minute via [place] continuously   3. DM (diabetes mellitus), secondary, uncontrolled, with peripheral vascular complications (HCC) -  continue Lantus 100 units/mL injected 12 units subcutaneous daily at bedtime Lab Results  Component Value Date   HGBA1C 8.7 03/17/2017    4. Closed fracture of one rib of right side, initial encounter - pain is well controlled, continue tramadol 50 mg 1 tab every 8 hours when necessary, calcium 600 mg 1 tab daily, vitamin D 3 2000 units 1 tab daily   5. Chronic obstructive pulmonary disease, unspecified COPD type (HCC) - no wheezing, continue ipratropium albuterol 1 neb every 4 hours when necessary, albuterol inhaler inhale 2 puffs into lungs every 6 hours when necessary, Brovana 15 g/2 mL one vial via nebulizer twice a day, budesonide 0.25 mg/2 mL one vial via nebulizer twice a day   6. Recurrent major depressive disorder, remission status unspecified (HCC) - mood this is stable, continue duloxetine 60 mg 1 capsule daily   7. Hypothyroidism due to acquired atrophy of thyroid - continue Synthroid 75 g 1 tab daily Lab Results  Component Value Date   TSH 2.95 01/26/2017    8. Insomnia, unspecified type - continue melatonin 3 mg 1 tab daily at bedtime   9. GERD without esophagitis  - stable, continue pantoprazole 40 mg 1 tab daily  10. Neuropathy - continue gabapentin 300 mg 1 capsule 3 times daily    Goals of care:  Long-term care    Monina C. Moenkopi - NP    [employer] [phone]

## 2017-08-29 ENCOUNTER — Non-Acute Institutional Stay (SKILLED_NURSING_FACILITY): Payer: Medicare Other | Admitting: Adult Health

## 2017-08-29 ENCOUNTER — Encounter: Payer: Self-pay | Admitting: Adult Health

## 2017-08-29 DIAGNOSIS — I5032 Chronic diastolic (congestive) heart failure: Secondary | ICD-10-CM | POA: Diagnosis not present

## 2017-08-29 DIAGNOSIS — J418 Mixed simple and mucopurulent chronic bronchitis: Secondary | ICD-10-CM

## 2017-08-29 DIAGNOSIS — I10 Essential (primary) hypertension: Secondary | ICD-10-CM

## 2017-08-29 DIAGNOSIS — G9340 Encephalopathy, unspecified: Secondary | ICD-10-CM

## 2017-08-29 DIAGNOSIS — G4700 Insomnia, unspecified: Secondary | ICD-10-CM

## 2017-08-29 DIAGNOSIS — E034 Atrophy of thyroid (acquired): Secondary | ICD-10-CM | POA: Diagnosis not present

## 2017-08-29 DIAGNOSIS — E1140 Type 2 diabetes mellitus with diabetic neuropathy, unspecified: Secondary | ICD-10-CM

## 2017-08-29 DIAGNOSIS — K219 Gastro-esophageal reflux disease without esophagitis: Secondary | ICD-10-CM

## 2017-08-29 DIAGNOSIS — F339 Major depressive disorder, recurrent, unspecified: Secondary | ICD-10-CM

## 2017-08-29 DIAGNOSIS — Z794 Long term (current) use of insulin: Secondary | ICD-10-CM

## 2017-08-29 LAB — CBC AND DIFFERENTIAL
HEMATOCRIT: 42 (ref 36–46)
HEMOGLOBIN: 12.6 (ref 12.0–16.0)
NEUTROS ABS: 8
Platelets: 248 (ref 150–399)
WBC: 10.4

## 2017-08-29 NOTE — Progress Notes (Signed)
Location:  [place] Room Number: 227-B Place of Service:  SNF (31) Provider:  Durenda Age, NP  Patient Care Team: Hendricks Limes, MD as PCP - General (Internal Medicine)  Extended Emergency Contact Information Primary Emergency Contact: Yvonne Kendall [place] Phone: [phone] Mobile Phone: [phone] Relation: Grandaughter  Code Status:  DNR Goals of care: Advanced Directive information Advanced Directives 08/29/2017  Does Patient Have a Medical Advance Directive? Yes  Type of Advance Directive Out of facility DNR (pink MOST or yellow form)  Does patient want to make changes to medical advance directive? No - Patient declined  Copy of [place] in Chart? -  Would patient like information on creating a medical advance directive? -  Pre-existing out of facility DNR order (yellow form or pink MOST form) -     Chief Complaint  Patient presents with  . Medical Management of Chronic Issues    Routine SNF visit    HPI:  Pt is an 83 y.o. female seen today for medical management of chronic diseases.  She is a long-term care resident of [facility].  She has a PMH of chronic hypercapnic respiratory failure secondary to COPD, chronic diastolic CHF, peripheral neuropathy, hypertension, and depression. She was seen today in her room today. She was reportedly ordered to be transferred to the hospital last night  Due to her being confused, hallucinating, seeing people in the ceiling. Patient reported feeling warm last night but no fever has been reported. Her O2 sat was 88-91%. She has productive cough. Noted slight wheezing on bilateral chest. She was seen eating her breakfast and requested for cereal and 2 sunny side up eggs. She ate breakfast with good appetite.    Past Medical History:  Diagnosis Date  . Anxiety state 02/25/2015  . Aortic atherosclerosis ([place]) 12/31/2016  . Aortic  atherosclerosis ([place]) 12/31/2016  . Breast CA ([place])   . CKD (chronic kidney disease) stage 3, GFR 30-59 ml/min (HCC) 12/15/2014  . COPD (chronic obstructive pulmonary disease) ([place])   . Diastolic CHF ([place]) 11/30/784  . DM (diabetes mellitus), secondary, uncontrolled, with peripheral vascular complications ([place]) 7/54/4920  . GERD without esophagitis 01/19/2016  . Hordeolum externum (stye)    07/01/2016  . HTN (hypertension) 01/18/2014  . Hypertension   . Hypothyroidism 01/18/2014  . Pneumonia   . Recurrent major depression ([place]) 10/03/2016  . Sigmoid diverticulosis   . Spondylosis, cervical, with myelopathy 04/11/2016   Past Surgical History:  Procedure Laterality Date  . BREAST SURGERY    . CHOLECYSTECTOMY    . MASTECTOMY Left     No Known Allergies  Outpatient Encounter Medications as of 08/29/2017  Medication Sig  . acetaminophen (TYLENOL) 500 MG tablet Take 500 mg by mouth every 6 (six) hours as needed (pain).   Marland Kitchen albuterol (PROVENTIL HFA;VENTOLIN HFA) 108 (90 Base) MCG/ACT inhaler Inhale 2 puffs into the lungs every 6 (six) hours as needed for wheezing or shortness of breath.  Marland Kitchen arformoterol (BROVANA) 15 MCG/2ML NEBU Take 2 mLs (15 mcg total) by nebulization 2 (two) times daily.  . ARTIFICIAL TEAR SOLUTION OP Place 1 drop into both eyes 4 (four) times daily as needed (for dry eyes).  Marland Kitchen aspirin 81 MG chewable tablet Chew 1 tablet (81 mg total) by mouth daily.  . benzocaine (ORAJEL) 10 % mucosal gel Use as directed 1 application in the mouth or throat 3 (three) times daily before meals.  . bisacodyl (DULCOLAX) 10  MG suppository Place 10 mg rectally once as needed for mild constipation.   . budesonide (PULMICORT) 0.25 MG/2ML nebulizer solution Take 0.25 mg by nebulization 2 (two) times daily.  . calcium carbonate (OSCAL) 1500 (600 Ca) MG TABS tablet Take 600 mg of elemental calcium by mouth daily with breakfast.  . carvedilol (COREG) 3.125 MG tablet Take 3.125 mg by mouth See admin  instructions. Take 1 tablet (3.125 mg) by mouth twice daily (8am and 4pm) - Hold for HR less than 60 and SBP less than 100.  Marland Kitchen Cholecalciferol (VITAMIN D3) 2000 units TABS Take 1 tablet daily by mouth.  . cycloSPORINE (RESTASIS) 0.05 % ophthalmic emulsion Place 1 drop into both eyes 2 (two) times daily. Must be given at least 15 minutes after other eye drops  . DULoxetine (CYMBALTA) 60 MG capsule Take 60 mg by mouth daily.   . furosemide (LASIX) 40 MG tablet Take 1 tablet (40 mg total) by mouth 2 (two) times daily.  Marland Kitchen gabapentin (NEURONTIN) 300 MG capsule Take 300 mg by mouth 3 (three) times daily.   . insulin glargine (LANTUS) 100 unit/mL SOPN Inject 12 Units into the skin at bedtime.  Marland Kitchen ipratropium-albuterol (DUONEB) 0.5-2.5 (3) MG/3ML SOLN Take 3 mLs by nebulization every 4 (four) hours as needed. FOR COPD  . levothyroxine (SYNTHROID, LEVOTHROID) 75 MCG tablet Take 75 mcg by mouth daily before breakfast.   . loratadine (CLARITIN) 10 MG tablet Take 10 mg by mouth daily.  . magnesium hydroxide (MILK OF MAGNESIA) 400 MG/5ML suspension Take 30 mLs by mouth once as needed for mild constipation.   . Melatonin 3 MG TABS Take 3 mg by mouth at bedtime.   . metFORMIN (GLUCOPHAGE) 500 MG tablet Take 500 mg by mouth 2 (two) times daily after a meal.   . nitroGLYCERIN (NITROSTAT) 0.4 MG SL tablet Place 0.4 mg under the tongue every 5 (five) minutes as needed for chest pain (call md if no relief after 3 doses).   . OXYGEN Inhale 3 L into the lungs continuous. FOR HYPOXIA  . pantoprazole (PROTONIX) 40 MG tablet Take 40 mg by mouth daily at 6 (six) AM.   . polyethylene glycol (MIRALAX / GLYCOLAX) packet Take 17 g by mouth daily.  . potassium chloride (K-DUR,KLOR-CON) 10 MEQ tablet Take 10 mEq by mouth daily.  Marland Kitchen Propylene Glycol (SYSTANE BALANCE) 0.6 % SOLN Place 1 drop into both eyes 2 (two) times daily.  . sennosides-docusate sodium (SENOKOT-S) 8.6-50 MG tablet Take 2 tablets by mouth 2 (two) times daily.  .  simethicone (MYLICON) 80 MG chewable tablet Chew 1 tablet (80 mg total) by mouth 4 (four) times daily as needed for flatulence.  . traMADol (ULTRAM) 50 MG tablet Take 1 tablet (50 mg total) by mouth every 8 (eight) hours as needed for severe pain.  . traZODone (DESYREL) 50 MG tablet Take 50 mg by mouth at bedtime.  . valsartan (DIOVAN) 40 MG tablet Take 20 mg by mouth daily. Take 1/2 tablet to = 20 mg  . [DISCONTINUED] cholecalciferol (VITAMIN D) 1000 UNITS tablet Take 1,000 Units by mouth daily. Reported on 04/11/2016   No facility-administered encounter medications on file as of 08/29/2017.     Review of Systems   GENERAL: no change in appetite, no fatigue, no weight changes, chills or weakness MOUTH and THROAT: Denies oral discomfort, gingival pain or bleeding RESPIRATORY: no hemoptysis, +wheezing, + cought CARDIAC: no chest pain, edema or palpitations GI: no abdominal pain, diarrhea, constipation, heart burn, nausea  or vomiting GU: Denies dysuria, frequency, hematuria or discharge PSYCHIATRIC: + hallucination    Immunization History  Administered Date(s) Administered  . Influenza Split 12/01/2014  . Influenza, High Dose Seasonal PF 09/09/2014  . Influenza,inj,Quad PF,6+ Mos 01/21/2014  . Influenza-Unspecified 08/04/2015, 07/29/2016, 08/21/2017  . PPD Test 11/14/2014, 01/15/2017, 05/16/2017  . Pneumococcal-Unspecified 10/24/2009, 10/24/2013   Pertinent  Health Maintenance Due  Topic Date Due  . PNA vac Low Risk Adult (2 of 2 - PCV13) 10/24/2014  . INFLUENZA VACCINE  01/21/2018 (Originally 05/24/2017)  . FOOT EXAM  07/10/2018 (Originally 12/11/1941)  . OPHTHALMOLOGY EXAM  07/10/2018 (Originally 12/11/1941)  . DEXA SCAN  12/11/2023 (Originally 12/10/1998)  . HEMOGLOBIN A1C  09/17/2017   Fall Risk  06/15/2017 01/25/2017 12/02/2016 07/15/2016 06/15/2016  Falls in the past year? Yes No No No No  Number falls in past yr: 1 - - - -  Injury with Fall? Yes - - - -   Functional Status Survey:   Uses wheelchair for ambulation, incontinent of bowel and bladder, on Heart healthy diet, 1500 ml fluid restriction, needs physical help in dressing    Vitals:   08/29/17 0923  BP: 126/76  Pulse: 78  Resp: 20  Temp: 98.1 F (36.7 C)  TempSrc: Oral  SpO2: 93%  Weight: 259 lb 4.2 oz (117.6 kg)  Height: 5' 3" (1.6 m)   Body mass index is 45.93 kg/m.    Physical Exam    GENERAL APPEARANCE: Well nourished. In no acute distress. Morbidly obese SKIN:  Skin is warm and dry.  MOUTH and THROAT: Lips are without lesions. Oral mucosa is moist and without lesions.  RESPIRATORY: breathing is even & unlabored, +wheezing on bilateral chest, 3L O2/min via [place] continuously CARDIAC: RRR, no murmur,no extra heart sounds, no edema GI: abdomen soft, normal BS, no masses, no tenderness, no hepatomegaly, no splenomegaly EXTREMITIES:  Able to move X 4 extremities, uses wheelchair for ambulation PSYCHIATRIC: Alert to self and place, disoriented to time. Affect and behavior are appropriate    Labs reviewed: 10/104/18   glucose 52 calcium 5.6 BUN 14.9 sodium 146 K3.9 CO2 14 total protein 5.1 albumin 3.12 SGOT 18 SGPT 13 eGFR 63.28 Recent Labs    09/19/16 0845 09/20/16 0525  05/14/17 0543 05/15/17 0322 05/16/17 0613 05/17/17 07/27/17  NA  --  139   < > 140 139 144 144 146  K  --  3.5   < > 4.5 4.3 3.8 4.7 3.9  CL  --  93*   < > 99* 93* 92*  --   --   CO2  --  34*   < > 33* 37* 40*  --   --   GLUCOSE  --  97   < > 177* 244* 130*  --   --   BUN  --  25*   < > 16 20 22* 26* 15  CREATININE  --  0.98   < > 0.72 0.80 0.73 0.6 0.9  CALCIUM  --  9.3   < > 8.6* 8.8* 8.7*  --   --   MG 2.2 2.1  --   --   --   --   --   --   PHOS 3.8 4.1  --   --   --   --   --   --    < > = values in this interval not displayed.   Recent Labs    04/08/17 0342 04/09/17 0740 05/14/17 0543 07/27/17  AST  15 24 14* 18  ALT 16 17 9* 13  ALKPHOS 69 66 46 69  BILITOT 0.6 1.1 0.6  --   PROT 6.2* 6.0* 5.5*  --     ALBUMIN 2.9* 2.8* 2.8*  --    Recent Labs    05/10/17 05/12/17 1541 05/13/17 0402 05/13/17 1430 07/27/17  WBC 7.6 9.4 5.1 8.7 7.5  NEUTROABS 5  --   --  7.6 5  HGB 10.9* 10.8* 10.4* 10.7* 11.4*  HCT 33* 36.4 35.2* 35.5* 36  MCV  --  104.0* 101.1* 101.4*  --   PLT 275 349 318 380 355   Lab Results  Component Value Date   TSH 2.95 01/26/2017   Lab Results  Component Value Date   HGBA1C 8.7 03/17/2017   Lab Results  Component Value Date   CHOL 160 12/08/2015   HDL 32 (A) 12/08/2015   LDLCALC 92 12/08/2015   TRIG 181 (A) 12/08/2015    Significant Diagnostic Results in last 30 days:  07/27/17  Glucose 52, CBG this morning 78, low  Assessment/Plan . 1. Type 2 diabetes mellitus with diabetic neuropathy, with long-term current use of insulin (HCC) -  Has episodes of hypoglycemia, will decrease Lantus 100 units/mL from 12 units to 8 units subcutaneous daily at bedtime, continue metformin 500 mg 1 tab twice a day  Lab Results  Component Value Date   HGBA1C 8.7 03/17/2017     2. Chronic diastolic CHF (congestive heart failure) (HCC) - continue Lasix 40 mg 1 tab twice a day, Valsartan 40 mg give 1/2 tab = 20 mg  Daily and K-tab ER 10 meq 1 tab daily for supplementation, carvedilol 3.125 mg 1 tab twice a day   3. Hypothyroidism due to acquired atrophy of thyroid -  continue Synthroid 75 g 1 tab daily  Lab Results  Component Value Date   TSH 2.95 01/26/2017    4. GERD without esophagitis - stable, continue pantoprazole 40 mg 1 tab daily   5. Recurrent major depressive disorder, remission status unspecified (HCC) - mood is stable, continue duloxetine 60 mg 1 capsule daily   6. Insomnia, unspecified type -  discontinue melatonin, continue trazodone 50 mg 1 tab daily at bedtime   7. Mixed simple and mucopurulent chronic bronchitis (HCC) - continue Ipratropim-albuterol PRN, Brovana neb twice a day, budesonide twice a day   8. Essential hypertension - Well controlled,  continue carvedilol 3.125 mg 1 tab twice a day and Valsartan 40 mg give 1/2 tab = 20 mg  Daily   9. Encephalopathy - will do work-up labs to rule out infections, will decrease Tramdol 50 mg Q 8 hours PRN to BID PRN and discontinue  Melatonin    Family/ staff Communication:  Discussed  Plan of care with patient and charge nurse   Labs/tests ordered:  CBC, BMP stat, CXR and UA w/ CS    Goals of care:  Long-term care      [facility] [phone]

## 2017-09-20 ENCOUNTER — Ambulatory Visit: Payer: Medicare Other | Admitting: Pulmonary Disease

## 2017-09-20 NOTE — Progress Notes (Deleted)
Subjective:    Patient ID: Dawn Montoya, female    DOB: [birth year], 83 y.o.   MRN: [id number]  Synopsis: Shernell has gold grade D COPD and was hospitalized in January 2016 for a COPD exacerbation which was complicated by diastolic heart failure. She was sent to a skilled nursing facility where she continues to participate in rehabilitation on a daily basis.  HPI No chief complaint on file.  ***  Past Medical History:  Diagnosis Date  . Anxiety state 02/25/2015  . Aortic atherosclerosis ([place]) 12/31/2016  . Aortic atherosclerosis ([place]) 12/31/2016  . Breast CA ([place])   . CKD (chronic kidney disease) stage 3, GFR 30-59 ml/min (HCC) 12/15/2014  . COPD (chronic obstructive pulmonary disease) ([place])   . Diastolic CHF ([place]) 12/26/3612  . DM (diabetes mellitus), secondary, uncontrolled, with peripheral vascular complications ([place]) 4/31/5400  . GERD without esophagitis 01/19/2016  . Hordeolum externum (stye)    07/01/2016  . HTN (hypertension) 01/18/2014  . Hypertension   . Hypothyroidism 01/18/2014  . Pneumonia   . Recurrent major depression ([place]) 10/03/2016  . Sigmoid diverticulosis   . Spondylosis, cervical, with myelopathy 04/11/2016      Review of Systems  Constitutional: Positive for fatigue. Negative for chills and fever.  HENT: Negative for postnasal drip, rhinorrhea and sinus pressure.   Respiratory: Positive for cough and shortness of breath. Negative for wheezing.   Cardiovascular: Positive for leg swelling. Negative for chest pain and palpitations.       Objective:   Physical Exam  There were no vitals filed for this visit. 3L [place] continuously  ***   Records from her June 2018 hospitalization reviewed were she had healthcare associated pneumonia and a COPD exacerbation. She required treatment with BiPAP, prednisone, Levaquin.    CBC    Component Value Date/Time   WBC 10.4 08/29/2017   WBC 8.7 05/13/2017 1430   RBC 3.50 (L) 05/13/2017 1430   HGB 12.6 08/29/2017   HCT 42 08/29/2017   PLT 248 08/29/2017   MCV 101.4 (H) 05/13/2017 1430   MCH 30.6 05/13/2017 1430   MCHC 30.1 05/13/2017 1430   RDW 14.7 05/13/2017 1430   LYMPHSABS 0.8 05/13/2017 1430   MONOABS 0.3 05/13/2017 1430   EOSABS 0.0 05/13/2017 1430   BASOSABS 0.0 05/13/2017 1430       Assessment & Plan:   No diagnosis found.  ***  Updated Medication List Outpatient Encounter Medications as of 09/20/2017  Medication Sig  . acetaminophen (TYLENOL) 500 MG tablet Take 500 mg by mouth every 6 (six) hours as needed (pain).   Marland Kitchen albuterol (PROVENTIL HFA;VENTOLIN HFA) 108 (90 Base) MCG/ACT inhaler Inhale 2 puffs into the lungs every 6 (six) hours as needed for wheezing or shortness of breath.  Marland Kitchen arformoterol (BROVANA) 15 MCG/2ML NEBU Take 2 mLs (15 mcg total) by nebulization 2 (two) times daily.  . ARTIFICIAL TEAR SOLUTION OP Place 1 drop into both eyes 4 (four) times daily as needed (for dry eyes).  Marland Kitchen aspirin 81 MG chewable tablet Chew 1 tablet (81 mg total) by mouth daily.  . benzocaine (ORAJEL) 10 % mucosal gel Use as directed 1 application in the mouth or throat 3 (three) times daily before meals.  . bisacodyl (DULCOLAX) 10 MG suppository Place 10 mg rectally once as needed for mild constipation.   . budesonide (PULMICORT) 0.25 MG/2ML nebulizer solution Take 0.25 mg by nebulization 2 (two) times daily.  . calcium carbonate (OSCAL) 1500 (600 Ca) MG TABS tablet Take 600 mg  of elemental calcium by mouth daily with breakfast.  . carvedilol (COREG) 3.125 MG tablet Take 3.125 mg by mouth See admin instructions. Take 1 tablet (3.125 mg) by mouth twice daily (8am and 4pm) - Hold for HR less than 60 and SBP less than 100.  Marland Kitchen Cholecalciferol (VITAMIN D3) 2000 units TABS Take 1 tablet daily by mouth.  . cycloSPORINE (RESTASIS) 0.05 % ophthalmic emulsion Place 1 drop into both eyes 2 (two) times daily. Must be given at least 15 minutes after other eye drops  . DULoxetine (CYMBALTA) 60 MG capsule Take 60  mg by mouth daily.   . furosemide (LASIX) 40 MG tablet Take 1 tablet (40 mg total) by mouth 2 (two) times daily.  Marland Kitchen gabapentin (NEURONTIN) 300 MG capsule Take 300 mg by mouth 3 (three) times daily.   . insulin glargine (LANTUS) 100 unit/mL SOPN Inject 12 Units into the skin at bedtime.  Marland Kitchen ipratropium-albuterol (DUONEB) 0.5-2.5 (3) MG/3ML SOLN Take 3 mLs by nebulization every 4 (four) hours as needed. FOR COPD  . levothyroxine (SYNTHROID, LEVOTHROID) 75 MCG tablet Take 75 mcg by mouth daily before breakfast.   . loratadine (CLARITIN) 10 MG tablet Take 10 mg by mouth daily.  . magnesium hydroxide (MILK OF MAGNESIA) 400 MG/5ML suspension Take 30 mLs by mouth once as needed for mild constipation.   . Melatonin 3 MG TABS Take 3 mg by mouth at bedtime.   . metFORMIN (GLUCOPHAGE) 500 MG tablet Take 500 mg by mouth 2 (two) times daily after a meal.   . nitroGLYCERIN (NITROSTAT) 0.4 MG SL tablet Place 0.4 mg under the tongue every 5 (five) minutes as needed for chest pain (call md if no relief after 3 doses).   . OXYGEN Inhale 3 L into the lungs continuous. FOR HYPOXIA  . pantoprazole (PROTONIX) 40 MG tablet Take 40 mg by mouth daily at 6 (six) AM.   . polyethylene glycol (MIRALAX / GLYCOLAX) packet Take 17 g by mouth daily.  . potassium chloride (K-DUR,KLOR-CON) 10 MEQ tablet Take 10 mEq by mouth daily.  Marland Kitchen Propylene Glycol (SYSTANE BALANCE) 0.6 % SOLN Place 1 drop into both eyes 2 (two) times daily.  . sennosides-docusate sodium (SENOKOT-S) 8.6-50 MG tablet Take 2 tablets by mouth 2 (two) times daily.  . simethicone (MYLICON) 80 MG chewable tablet Chew 1 tablet (80 mg total) by mouth 4 (four) times daily as needed for flatulence.  . traMADol (ULTRAM) 50 MG tablet Take 1 tablet (50 mg total) by mouth every 8 (eight) hours as needed for severe pain.  . traZODone (DESYREL) 50 MG tablet Take 50 mg by mouth at bedtime.  . valsartan (DIOVAN) 40 MG tablet Take 20 mg by mouth daily. Take 1/2 tablet to = 20 mg    No facility-administered encounter medications on file as of 09/20/2017.

## 2017-09-28 ENCOUNTER — Encounter: Payer: Self-pay | Admitting: Adult Health

## 2017-09-28 ENCOUNTER — Non-Acute Institutional Stay (SKILLED_NURSING_FACILITY): Payer: Medicare Other | Admitting: Adult Health

## 2017-09-28 DIAGNOSIS — G629 Polyneuropathy, unspecified: Secondary | ICD-10-CM | POA: Diagnosis not present

## 2017-09-28 DIAGNOSIS — E1351 Other specified diabetes mellitus with diabetic peripheral angiopathy without gangrene: Secondary | ICD-10-CM | POA: Diagnosis not present

## 2017-09-28 DIAGNOSIS — G4700 Insomnia, unspecified: Secondary | ICD-10-CM | POA: Diagnosis not present

## 2017-09-28 DIAGNOSIS — K219 Gastro-esophageal reflux disease without esophagitis: Secondary | ICD-10-CM | POA: Diagnosis not present

## 2017-09-28 DIAGNOSIS — E1365 Other specified diabetes mellitus with hyperglycemia: Secondary | ICD-10-CM

## 2017-09-28 DIAGNOSIS — IMO0002 Reserved for concepts with insufficient information to code with codable children: Secondary | ICD-10-CM

## 2017-09-28 DIAGNOSIS — I10 Essential (primary) hypertension: Secondary | ICD-10-CM | POA: Diagnosis not present

## 2017-09-28 DIAGNOSIS — E034 Atrophy of thyroid (acquired): Secondary | ICD-10-CM

## 2017-09-28 DIAGNOSIS — F339 Major depressive disorder, recurrent, unspecified: Secondary | ICD-10-CM

## 2017-09-28 DIAGNOSIS — J189 Pneumonia, unspecified organism: Secondary | ICD-10-CM

## 2017-09-28 DIAGNOSIS — I5032 Chronic diastolic (congestive) heart failure: Secondary | ICD-10-CM | POA: Diagnosis not present

## 2017-09-28 NOTE — Progress Notes (Signed)
Location:  [place] Room Number: 227-B Place of Service:  SNF (31) Provider:  Durenda Age, NP  Patient Care Team: Hendricks Limes, MD as PCP - General (Internal Medicine)  Extended Emergency Contact Information Primary Emergency Contact: Yvonne Kendall [place] of [place] Phone: [phone] Mobile Phone: [phone] Relation: Grandaughter  Code Status:  DNR  Goals of care: Advanced Directive information Advanced Directives 09/28/2017  Does Patient Have a Medical Advance Directive? Yes  Type of Advance Directive Out of facility DNR (pink MOST or yellow form)  Does patient want to make changes to medical advance directive? No - Patient declined  Copy of [place] in Chart? -  Would patient like information on creating a medical advance directive? -  Pre-existing out of facility DNR order (yellow form or pink MOST form) -     Chief Complaint  Patient presents with  . Medical Management of Chronic Issues    Routine [facility] visit    HPI:  Pt is a 83 y.o. female seen today for medical management of chronic diseases.  She is a long-term care resident of [facility].  She has a PMH of chronic hypercapnic respiratory failure secondary to COPD, chronic diastolic CHF, peripheral neuropathy, HTN, and depression. She complained of mild SOB. No wheezinf noted but has decreased breath sounds bilaterally. Chest x-ray showed right basilar infiltrate. No reported fever nor cough.   Past Medical History:  Diagnosis Date  . Anxiety state 02/25/2015  . Aortic atherosclerosis ([place]) 12/31/2016  . Aortic atherosclerosis ([place]) 12/31/2016  . Breast CA ([place])   . CKD (chronic kidney disease) stage 3, GFR 30-59 ml/min (HCC) 12/15/2014  . COPD (chronic obstructive pulmonary disease) ([place])   . Diastolic CHF ([place]) 0/10/930  . DM (diabetes mellitus), secondary, uncontrolled, with peripheral vascular complications  ([place]) 3/55/7322  . GERD without esophagitis 01/19/2016  . Hordeolum externum (stye)    07/01/2016  . HTN (hypertension) 01/18/2014  . Hypertension   . Hypothyroidism 01/18/2014  . Pneumonia   . Recurrent major depression ([place]) 10/03/2016  . Sigmoid diverticulosis   . Spondylosis, cervical, with myelopathy 04/11/2016   Past Surgical History:  Procedure Laterality Date  . BREAST SURGERY    . CHOLECYSTECTOMY    . COLONOSCOPY N/A 01/20/2014   Procedure: COLONOSCOPY;  Surgeon: Winfield Cunas., MD;  Location: [facility];  Service: Endoscopy;  Laterality: N/A;  . ESOPHAGOGASTRODUODENOSCOPY N/A 01/19/2014   Procedure: ESOPHAGOGASTRODUODENOSCOPY (EGD);  Surgeon: Jeryl Columbia, MD;  Location: [facility];  Service: Endoscopy;  Laterality: N/A;  . MASTECTOMY Left     No Known Allergies  Outpatient Encounter Medications as of 09/28/2017  Medication Sig  . acetaminophen (TYLENOL) 500 MG tablet Take 500 mg by mouth every 6 (six) hours as needed (pain).   Marland Kitchen albuterol (PROVENTIL HFA;VENTOLIN HFA) 108 (90 Base) MCG/ACT inhaler Inhale 2 puffs into the lungs every 6 (six) hours as needed for wheezing or shortness of breath.  Marland Kitchen arformoterol (BROVANA) 15 MCG/2ML NEBU Take 2 mLs (15 mcg total) by nebulization 2 (two) times daily.  . ARTIFICIAL TEAR SOLUTION OP Place 1 drop into both eyes 4 (four) times daily as needed (for dry eyes).  Marland Kitchen aspirin 81 MG chewable tablet Chew 1 tablet (81 mg total) by mouth daily.  . benzocaine (ORAJEL) 10 % mucosal gel Use as directed 1 application in the mouth or throat 3 (three) times daily before meals.  . bisacodyl (DULCOLAX) 10 MG suppository Place  10 mg rectally once as needed for mild constipation.   . budesonide (PULMICORT) 0.25 MG/2ML nebulizer solution Take 0.25 mg by nebulization 2 (two) times daily.  . busPIRone (BUSPAR) 5 MG tablet Take 5 mg by mouth 2 (two) times daily.  . calcium carbonate (OSCAL) 1500 (600 Ca) MG TABS tablet Take 600 mg of elemental calcium by  mouth daily with breakfast.  . carvedilol (COREG) 3.125 MG tablet Take 3.125 mg by mouth See admin instructions. Take 1 tablet (3.125 mg) by mouth twice daily (8am and 4pm) - Hold for HR less than 60 and SBP less than 100.  Marland Kitchen Cholecalciferol (VITAMIN D3) 2000 units TABS Take 1 tablet daily by mouth.  . cycloSPORINE (RESTASIS) 0.05 % ophthalmic emulsion Place 1 drop into both eyes 2 (two) times daily. Must be given at least 15 minutes after other eye drops  . doxycycline (VIBRA-TABS) 100 MG tablet Take 100 mg by mouth 2 (two) times daily.  . DULoxetine (CYMBALTA) 60 MG capsule Take 60 mg by mouth daily.   . furosemide (LASIX) 40 MG tablet Take 1 tablet (40 mg total) by mouth 2 (two) times daily.  Marland Kitchen gabapentin (NEURONTIN) 300 MG capsule Take 300 mg by mouth 3 (three) times daily.   . insulin glargine (LANTUS) 100 unit/mL SOPN Inject 8 Units into the skin at bedtime.   Marland Kitchen ipratropium-albuterol (DUONEB) 0.5-2.5 (3) MG/3ML SOLN Take 3 mLs by nebulization every 4 (four) hours as needed. FOR COPD  . levothyroxine (SYNTHROID, LEVOTHROID) 75 MCG tablet Take 75 mcg by mouth daily before breakfast.   . loratadine (CLARITIN) 10 MG tablet Take 10 mg by mouth daily.  . magnesium hydroxide (MILK OF MAGNESIA) 400 MG/5ML suspension Take 30 mLs by mouth once as needed for mild constipation.   . metFORMIN (GLUCOPHAGE) 500 MG tablet Take 500 mg by mouth 2 (two) times daily after a meal.   . nitroGLYCERIN (NITROSTAT) 0.4 MG SL tablet Place 0.4 mg under the tongue every 5 (five) minutes as needed for chest pain (call md if no relief after 3 doses).   . OXYGEN Inhale 3 L into the lungs continuous. FOR HYPOXIA  . pantoprazole (PROTONIX) 40 MG tablet Take 40 mg by mouth daily at 6 (six) AM.   . polyethylene glycol (MIRALAX / GLYCOLAX) packet Take 17 g by mouth daily.  . potassium chloride (K-DUR,KLOR-CON) 10 MEQ tablet Take 10 mEq by mouth daily.  Marland Kitchen Propylene Glycol (SYSTANE BALANCE) 0.6 % SOLN Place 1 drop into both eyes  2 (two) times daily.  Marland Kitchen saccharomyces boulardii (FLORASTOR) 250 MG capsule Take 250 mg by mouth 2 (two) times daily.  . sennosides-docusate sodium (SENOKOT-S) 8.6-50 MG tablet Take 2 tablets by mouth 2 (two) times daily.  . simethicone (MYLICON) 80 MG chewable tablet Chew 1 tablet (80 mg total) by mouth 4 (four) times daily as needed for flatulence.  . traMADol (ULTRAM) 50 MG tablet Take 1 tablet (50 mg total) by mouth every 8 (eight) hours as needed for severe pain.  . traZODone (DESYREL) 50 MG tablet Take 50 mg by mouth at bedtime.  . valsartan (DIOVAN) 40 MG tablet Take 20 mg by mouth daily. Take 1/2 tablet to = 20 mg  . [DISCONTINUED] Melatonin 3 MG TABS Take 3 mg by mouth at bedtime.    No facility-administered encounter medications on file as of 09/28/2017.     Review of Systems  GENERAL: No change in appetite, no fatigue, no weight changes, no fever, chills or weakness MOUTH  and THROAT: Denies oral discomfort, gingival pain or bleeding, pain from teeth or hoarseness   RESPIRATORY: no cough, SOB, DOE, wheezing, hemoptysis CARDIAC: No chest pain, edema or palpitations GI: No abdominal pain, diarrhea, constipation, heart burn, nausea or vomiting GU: Denies dysuria, frequency, hematuria, or discharge PSYCHIATRIC: Denies feelings of depression or anxiety. No report of hallucinations, insomnia, paranoia, or agitation   Immunization History  Administered Date(s) Administered  . Influenza Split 12/01/2014  . Influenza, High Dose Seasonal PF 09/09/2014  . Influenza,inj,Quad PF,6+ Mos 01/21/2014  . Influenza-Unspecified 08/04/2015, 07/29/2016, 08/21/2017  . PPD Test 11/14/2014, 01/15/2017, 05/16/2017  . Pneumococcal-Unspecified 10/24/2009, 10/24/2013   Pertinent  Health Maintenance Due  Topic Date Due  . PNA vac Low Risk Adult (2 of 2 - PCV13) 10/24/2014  . HEMOGLOBIN A1C  09/17/2017  . FOOT EXAM  07/10/2018 (Originally 12/11/1941)  . OPHTHALMOLOGY EXAM  07/10/2018 (Originally  12/11/1941)  . DEXA SCAN  12/11/2023 (Originally 12/10/1998)  . INFLUENZA VACCINE  Completed   Fall Risk  06/15/2017 01/25/2017 12/02/2016 07/15/2016 06/15/2016  Falls in the past year? Yes No No No No  Number falls in past yr: 1 - - - -  Injury with Fall? Yes - - - -      Vitals:   09/28/17 1626  BP: 139/74  Pulse: 78  Resp: 19  Temp: (!) 97 F (36.1 C)  TempSrc: Oral  SpO2: 94%  Weight: 259 lb 4.2 oz (117.6 kg)  Height: 5\' 3"  (1.6 m)   Body mass index is 45.93 kg/m.  Physical Exam  GENERAL APPEARANCE: Well nourished. In no acute distress. Morbidly obese SKIN:  Skin is warm and dry.  MOUTH and THROAT: Lips are without lesions. Oral mucosa is moist and without lesions. RESPIRATORY: Decreased breath sounds on bilateral lung fields, - wheezing CARDIAC: RRR, no murmur,no extra heart sounds, no edema GI: Abdomen soft, normal BS, no masses, no tenderness EXTREMITIES:  Able to move X 4 extremities PSYCHIATRIC: Alert and oriented X 3. Affect and behavior are appropriate    Labs reviewed: Recent Labs    05/14/17 0543 05/15/17 0322 05/16/17 0613 05/17/17 07/27/17  NA 140 139 144 144 146  K 4.5 4.3 3.8 4.7 3.9  CL 99* 93* 92*  --   --   CO2 33* 37* 40*  --   --   GLUCOSE 177* 244* 130*  --   --   BUN 16 20 22* 26* 15  CREATININE 0.72 0.80 0.73 0.6 0.9  CALCIUM 8.6* 8.8* 8.7*  --   --    Recent Labs    04/08/17 0342 04/09/17 0740 05/14/17 0543 07/27/17  AST 15 24 14* 18  ALT 16 17 9* 13  ALKPHOS 69 66 46 69  BILITOT 0.6 1.1 0.6  --   PROT 6.2* 6.0* 5.5*  --   ALBUMIN 2.9* 2.8* 2.8*  --    Recent Labs    05/12/17 1541 05/13/17 0402 05/13/17 1430 07/27/17 08/29/17  WBC 9.4 5.1 8.7 7.5 10.4  NEUTROABS  --   --  7.6 5 8   HGB 10.8* 10.4* 10.7* 11.4* 12.6  HCT 36.4 35.2* 35.5* 36 42  MCV 104.0* 101.1* 101.4*  --   --   PLT 349 318 380 355 248   Lab Results  Component Value Date   TSH 2.95 01/26/2017   Lab Results  Component Value Date   HGBA1C 8.7 03/17/2017    Lab Results  Component Value Date   CHOL 160 12/08/2015  HDL 32 (A) 12/08/2015   LDLCALC 92 12/08/2015   TRIG 181 (A) 12/08/2015    Assessment/Plan  1. HCAP (healthcare-associated pneumonia) - will start doxycycline 100 mg 1 tab by mouth twice a day 10 days and Florastor 250 mg 1 capsule twice a day 13 days   2. Hypothyroidism due to acquired atrophy of thyroid - continue Lab Results  Component Value Date   TSH 2.95 01/26/2017     3. Type 2 diabetes mellitus with diabetic neuropathy, with long-term current use of insulin (HCC) - Lantus 100 units/mL 8 units subcutaneous daily at bedtime, metformin 500 mg 1 tab twice a day, continue CBG checks Lab Results  Component Value Date   HGBA1C 8.7 03/17/2017     4. Chronic diastolic CHF (congestive heart failure) (HCC) - continue Lasix 40 mg 1 tab twice a day and KCl ER 10 MEQ 1 tab daily   5. Recurrent major depressive disorder, remission status unspecified (HCC) - mood is stable, continue duloxetine 60 mg 1 capsule daily and buspirone 5 mg twice a day   6. GERD without esophagitis - stable, continue pantoprazole 40 mg daily   7. Neuropathy - continue Neurontin 300 mg 3 times a day   8. Essential hypertension - well-controlled, continue valsartan 40 mg 1/2 TAB= 20 mg daily, carvedilol 3.125 mg twice a day   9. Insomnia, unspecified type - continue trazodone 50 mg daily at bedtime and melatonin 3 mg daily at bedtime  10.  COPD -  continue Brovana neb twice a day, budesonide nebs twice a day, albuterol when necessary and O2 at 3 L/minute via [place] continuously    Family/ staff Communication: Discussed plan of care with charge nurse and the resident   Labs/tests ordered:  Chest x-ray   Goals of care:   Long-term care   Durenda Age, NP [facility] [phone] (Monday-Friday 8:00 a.m. - 5:00 p.m.) [phone] (after hours)

## 2017-10-10 ENCOUNTER — Encounter: Payer: Self-pay | Admitting: Internal Medicine

## 2017-10-10 NOTE — Progress Notes (Signed)
This encounter was created in error - please disregard.  This encounter was created in error - please disregard.

## 2017-10-12 ENCOUNTER — Non-Acute Institutional Stay (SKILLED_NURSING_FACILITY): Payer: Medicare Other | Admitting: Adult Health

## 2017-10-12 ENCOUNTER — Encounter: Payer: Self-pay | Admitting: Adult Health

## 2017-10-12 DIAGNOSIS — I952 Hypotension due to drugs: Secondary | ICD-10-CM | POA: Diagnosis not present

## 2017-10-12 NOTE — Progress Notes (Signed)
Location:  [place] Room Number: 227-B Place of Service:  SNF (31) Provider:  Durenda Age, NP  Patient Care Team: Hendricks Limes, MD as PCP - General (Internal Medicine)  Extended Emergency Contact Information Primary Emergency Contact: Yvonne Kendall States of [place] Phone: [phone] Mobile Phone: [phone] Relation: Grandaughter  Code Status:  DNR  Goals of care: Advanced Directive information Advanced Directives 09/28/2017  Does Patient Have a Medical Advance Directive? Yes  Type of Advance Directive Out of facility DNR (pink MOST or yellow form)  Does patient want to make changes to medical advance directive? No - Patient declined  Copy of [place] in Chart? -  Would patient like information on creating a medical advance directive? -  Pre-existing out of facility DNR order (yellow form or pink MOST form) -     Chief Complaint  Patient presents with  . Acute Visit    Patient with documented hypotension - BP 78/44    HPI:  Pt is an 83 y.o. female seen today for an acute visit for hypotension (BP 78/44). She was seen in the room today. She denies dizziness. She was seen lying down waiting for a bed bath. BPs - 78/44, 131/61, 123/75, 133/72, 126/85. The patient is a long-term care resident of [facility].  She has a PMH of chronic hypercapnic respiratory failure secondary to COPD, chronic diastolic congestive heart failure, peripheral neuropathy, HTN, and depression.     Past Medical History:  Diagnosis Date  . Anxiety state 02/25/2015  . Aortic atherosclerosis ([place]) 12/31/2016  . Aortic atherosclerosis ([place]) 12/31/2016  . Breast CA ([place])   . CKD (chronic kidney disease) stage 3, GFR 30-59 ml/min (HCC) 12/15/2014  . COPD (chronic obstructive pulmonary disease) ([place])   . Diastolic CHF ([place]) 11/27/2681  . DM (diabetes mellitus), secondary, uncontrolled, with peripheral vascular  complications ([place]) 02/09/6221  . GERD without esophagitis 01/19/2016  . Hordeolum externum (stye)    07/01/2016  . HTN (hypertension) 01/18/2014  . Hypertension   . Hypothyroidism 01/18/2014  . Pneumonia   . Recurrent major depression ([place]) 10/03/2016  . Sigmoid diverticulosis   . Spondylosis, cervical, with myelopathy 04/11/2016   Past Surgical History:  Procedure Laterality Date  . BREAST SURGERY    . CHOLECYSTECTOMY    . COLONOSCOPY N/A 01/20/2014   Procedure: COLONOSCOPY;  Surgeon: Winfield Cunas., MD;  Location: [facility];  Service: Endoscopy;  Laterality: N/A;  . ESOPHAGOGASTRODUODENOSCOPY N/A 01/19/2014   Procedure: ESOPHAGOGASTRODUODENOSCOPY (EGD);  Surgeon: Jeryl Columbia, MD;  Location: [facility];  Service: Endoscopy;  Laterality: N/A;  . MASTECTOMY Left     No Known Allergies  Outpatient Encounter Medications as of 10/12/2017  Medication Sig  . acetaminophen (TYLENOL) 500 MG tablet Take 500 mg by mouth every 6 (six) hours as needed (pain).   Marland Kitchen albuterol (PROVENTIL HFA;VENTOLIN HFA) 108 (90 Base) MCG/ACT inhaler Inhale 2 puffs into the lungs every 6 (six) hours as needed for wheezing or shortness of breath.  Marland Kitchen arformoterol (BROVANA) 15 MCG/2ML NEBU Take 2 mLs (15 mcg total) by nebulization 2 (two) times daily.  . ARTIFICIAL TEAR SOLUTION OP Place 1 drop into both eyes 4 (four) times daily as needed (for dry eyes).  Marland Kitchen aspirin 81 MG chewable tablet Chew 1 tablet (81 mg total) by mouth daily.  . bisacodyl (DULCOLAX) 10 MG suppository Place 10 mg rectally once as needed for mild constipation.   . budesonide (PULMICORT) 0.25 MG/2ML  nebulizer solution Take 0.25 mg by nebulization 2 (two) times daily.  . busPIRone (BUSPAR) 5 MG tablet Take 5 mg by mouth 2 (two) times daily.  . calcium carbonate (OSCAL) 1500 (600 Ca) MG TABS tablet Take 600 mg of elemental calcium by mouth daily with breakfast.  . carvedilol (COREG) 3.125 MG tablet Take 3.125 mg by mouth See admin instructions.  Take 1 tablet (3.125 mg) by mouth twice daily (8am and 4pm) - Hold for HR less than 60 and SBP less than 100.  Marland Kitchen Cholecalciferol (VITAMIN D3) 2000 units TABS Take 1 tablet daily by mouth.  . cycloSPORINE (RESTASIS) 0.05 % ophthalmic emulsion Place 1 drop into both eyes 2 (two) times daily. Must be given at least 15 minutes after other eye drops  . DULoxetine (CYMBALTA) 60 MG capsule Take 60 mg by mouth daily.   . furosemide (LASIX) 40 MG tablet Take 1 tablet (40 mg total) by mouth 2 (two) times daily.  Marland Kitchen gabapentin (NEURONTIN) 300 MG capsule Take 300 mg by mouth 3 (three) times daily.   . insulin glargine (LANTUS) 100 unit/mL SOPN Inject 8 Units into the skin at bedtime.   Marland Kitchen ipratropium-albuterol (DUONEB) 0.5-2.5 (3) MG/3ML SOLN Take 3 mLs by nebulization every 4 (four) hours as needed. FOR COPD  . levothyroxine (SYNTHROID, LEVOTHROID) 75 MCG tablet Take 75 mcg by mouth daily before breakfast.   . loratadine (CLARITIN) 10 MG tablet Take 10 mg by mouth daily.  . magnesium hydroxide (MILK OF MAGNESIA) 400 MG/5ML suspension Take 30 mLs by mouth once as needed for mild constipation.   . metFORMIN (GLUCOPHAGE) 500 MG tablet Take 500 mg by mouth 2 (two) times daily after a meal.   . nitroGLYCERIN (NITROSTAT) 0.4 MG SL tablet Place 0.4 mg under the tongue every 5 (five) minutes as needed for chest pain (call md if no relief after 3 doses).   . OXYGEN Inhale 3 L into the lungs continuous. FOR HYPOXIA  . pantoprazole (PROTONIX) 40 MG tablet Take 40 mg by mouth daily at 6 (six) AM.   . polyethylene glycol (MIRALAX / GLYCOLAX) packet Take 17 g by mouth daily.  . potassium chloride (K-DUR,KLOR-CON) 10 MEQ tablet Take 10 mEq by mouth daily.  Marland Kitchen Propylene Glycol (SYSTANE BALANCE) 0.6 % SOLN Place 1 drop into both eyes 2 (two) times daily.  . sennosides-docusate sodium (SENOKOT-S) 8.6-50 MG tablet Take 2 tablets by mouth 2 (two) times daily.  . simethicone (MYLICON) 80 MG chewable tablet Chew 1 tablet (80 mg  total) by mouth 4 (four) times daily as needed for flatulence.  . traMADol (ULTRAM) 50 MG tablet Take 1 tablet (50 mg total) by mouth every 8 (eight) hours as needed for severe pain.  . traZODone (DESYREL) 50 MG tablet Take 50 mg by mouth at bedtime.  . valsartan (DIOVAN) 40 MG tablet Take 20 mg by mouth daily. Take 1/2 tablet to = 20 mg  . [DISCONTINUED] benzocaine (ORAJEL) 10 % mucosal gel Use as directed 1 application in the mouth or throat 3 (three) times daily before meals.   No facility-administered encounter medications on file as of 10/12/2017.     Review of Systems  GENERAL: No change in appetite, no fatigue, no weight changes, no fever, chills or weakness MOUTH and THROAT: Denies oral discomfort, gingival pain or bleeding RESPIRATORY: no cough, SOB, DOE, wheezing, hemoptysis CARDIAC: No chest pain, edema or palpitations GI: No abdominal pain, diarrhea, constipation, heart burn, nausea or vomiting GU: Denies dysuria, frequency, hematuria,  incontinence, or discharge PSYCHIATRIC: Denies feelings of depression or anxiety. No report of hallucinations, insomnia, paranoia, or agitation   Immunization History  Administered Date(s) Administered  . Influenza Split 12/01/2014  . Influenza, High Dose Seasonal PF 09/09/2014  . Influenza,inj,Quad PF,6+ Mos 01/21/2014  . Influenza-Unspecified 08/04/2015, 07/29/2016, 08/21/2017  . PPD Test 11/14/2014, 01/15/2017, 05/16/2017  . Pneumococcal-Unspecified 10/24/2009, 10/24/2013   Pertinent  Health Maintenance Due  Topic Date Due  . PNA vac Low Risk Adult (2 of 2 - PCV13) 10/24/2014  . HEMOGLOBIN A1C  09/17/2017  . FOOT EXAM  07/10/2018 (Originally 12/11/1941)  . OPHTHALMOLOGY EXAM  07/10/2018 (Originally 12/11/1941)  . DEXA SCAN  12/11/2023 (Originally 12/10/1998)  . INFLUENZA VACCINE  Completed   Fall Risk  06/15/2017 01/25/2017 12/02/2016 07/15/2016 06/15/2016  Falls in the past year? Yes No No No No  Number falls in past yr: 1 - - - -  Injury  with Fall? Yes - - - -      Vitals:   10/12/17 1454 10/12/17 1455  BP: (!) 78/44 131/61  Pulse: 94 72  Resp: (!) 22   Temp: (!) 97.1 F (36.2 C)   TempSrc: Oral   SpO2: 98%   Weight: 236 lb 12.8 oz (107.4 kg)   Height: 5\' 3"  (1.6 m)    Body mass index is 41.95 kg/m.  Physical Exam  GENERAL APPEARANCE: Well nourished. In no acute distress. Morbidly obese SKIN:  Skin is warm and dry.  MOUTH and THROAT: Lips are without lesions. Oral mucosa is moist and without lesions. RESPIRATORY: Breathing is even & unlabored, BS CTAB CARDIAC: RRR, no murmur,no extra heart sounds, no edema GI: Abdomen soft, normal BS, no masses, no tenderness EXTREMITIES:  Able to move X 4 extremities PSYCHIATRIC: Alert and oriented X 3. Affect and behavior are appropriate   Labs reviewed: Recent Labs    05/14/17 0543 05/15/17 0322 05/16/17 0613 05/17/17 07/27/17  NA 140 139 144 144 146  K 4.5 4.3 3.8 4.7 3.9  CL 99* 93* 92*  --   --   CO2 33* 37* 40*  --   --   GLUCOSE 177* 244* 130*  --   --   BUN 16 20 22* 26* 15  CREATININE 0.72 0.80 0.73 0.6 0.9  CALCIUM 8.6* 8.8* 8.7*  --   --    Recent Labs    04/08/17 0342 04/09/17 0740 05/14/17 0543 07/27/17  AST 15 24 14* 18  ALT 16 17 9* 13  ALKPHOS 69 66 46 69  BILITOT 0.6 1.1 0.6  --   PROT 6.2* 6.0* 5.5*  --   ALBUMIN 2.9* 2.8* 2.8*  --    Recent Labs    05/12/17 1541 05/13/17 0402 05/13/17 1430 07/27/17 08/29/17  WBC 9.4 5.1 8.7 7.5 10.4  NEUTROABS  --   --  7.6 5 8   HGB 10.8* 10.4* 10.7* 11.4* 12.6  HCT 36.4 35.2* 35.5* 36 42  MCV 104.0* 101.1* 101.4*  --   --   PLT 349 318 380 355 248   Lab Results  Component Value Date   TSH 2.95 01/26/2017   Lab Results  Component Value Date   HGBA1C 8.7 03/17/2017   Lab Results  Component Value Date   CHOL 160 12/08/2015   HDL 32 (A) 12/08/2015   LDLCALC 92 12/08/2015   TRIG 181 (A) 12/08/2015    Assessment/Plan  1. Hypotension due to drugs - decrease Carvedilol from 3.125 mg 1  tab BID to 3.125  mg give 1/2 tab= 1.56 mg BID, Hold Valsartan  40 mg 1/2 tab = 20 mg daily for SBP< [place], NP [place] [phone] (Monday-Friday 8:00 a.m. - 5:00 p.m.) [phone] (after hours)

## 2017-10-24 DEATH — deceased

## 2017-11-08 IMAGING — DX DG CHEST 1V PORT
1 series · 1 of 1 positions shown · non-contrast
Comparison: 04/12/2017

CLINICAL DATA: Shortness of breath.  Wheezing.

EXAM:
PORTABLE CHEST 1 VIEW

[chest ap]
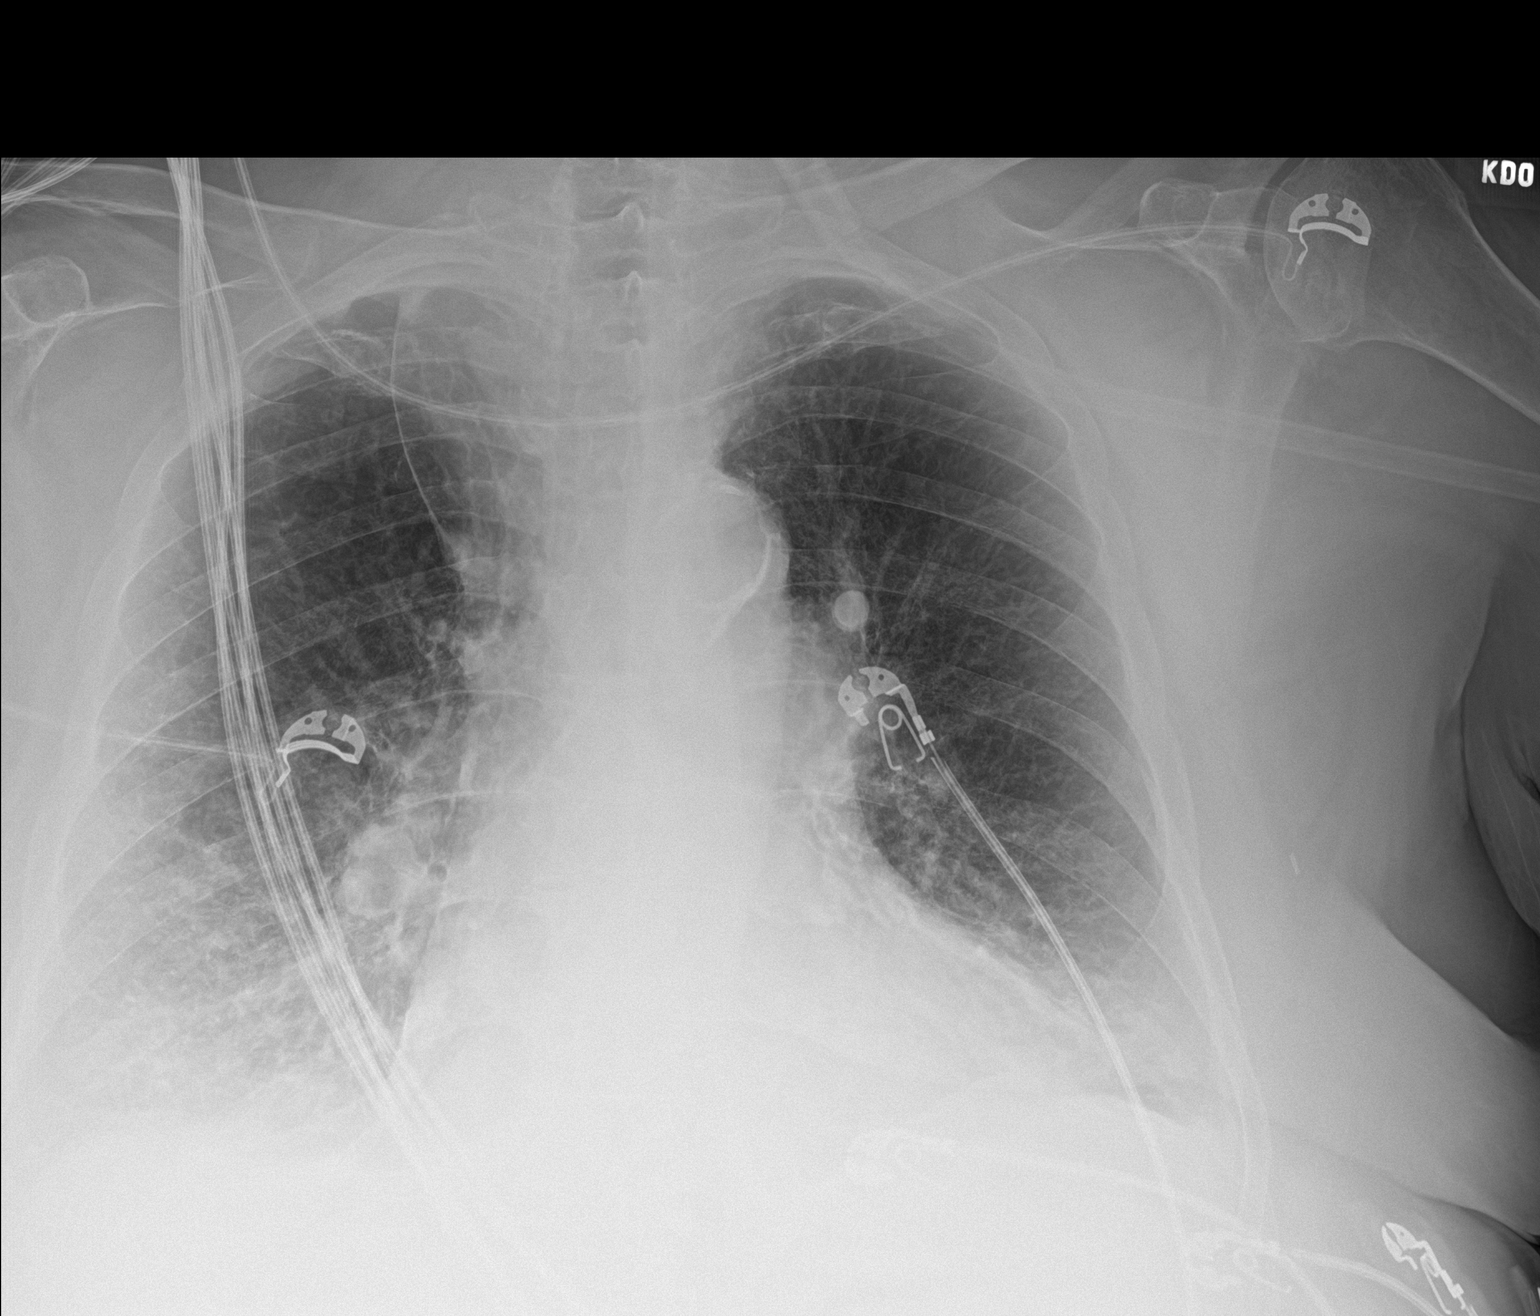

[1 of 1 positions shown; findings below may reference images not displayed]

FINDINGS: There is haziness at the lung bases, right greater than left. This
could represent pneumonia or pulmonary edema.

The heart size and pulmonary vascularity appear normal.

Calcification in the arch of the aorta.

No acute bone abnormality.
IMPRESSION: Hazy infiltrates at the lung bases, right more than left. This could
represent pneumonia or pulmonary edema.

Aortic atherosclerosis.

## 2017-11-09 IMAGING — DX DG CHEST 2V
2 series · 2 of 2 positions shown · non-contrast
Comparison: 05/12/2017

CLINICAL DATA: Shortness breath, cough, chest pain on LEFT
radiating to LEFT shoulder yesterday, hypertension, CHF, former
smoker, breast cancer

EXAM:
CHEST  2 VIEW

[chest ap]
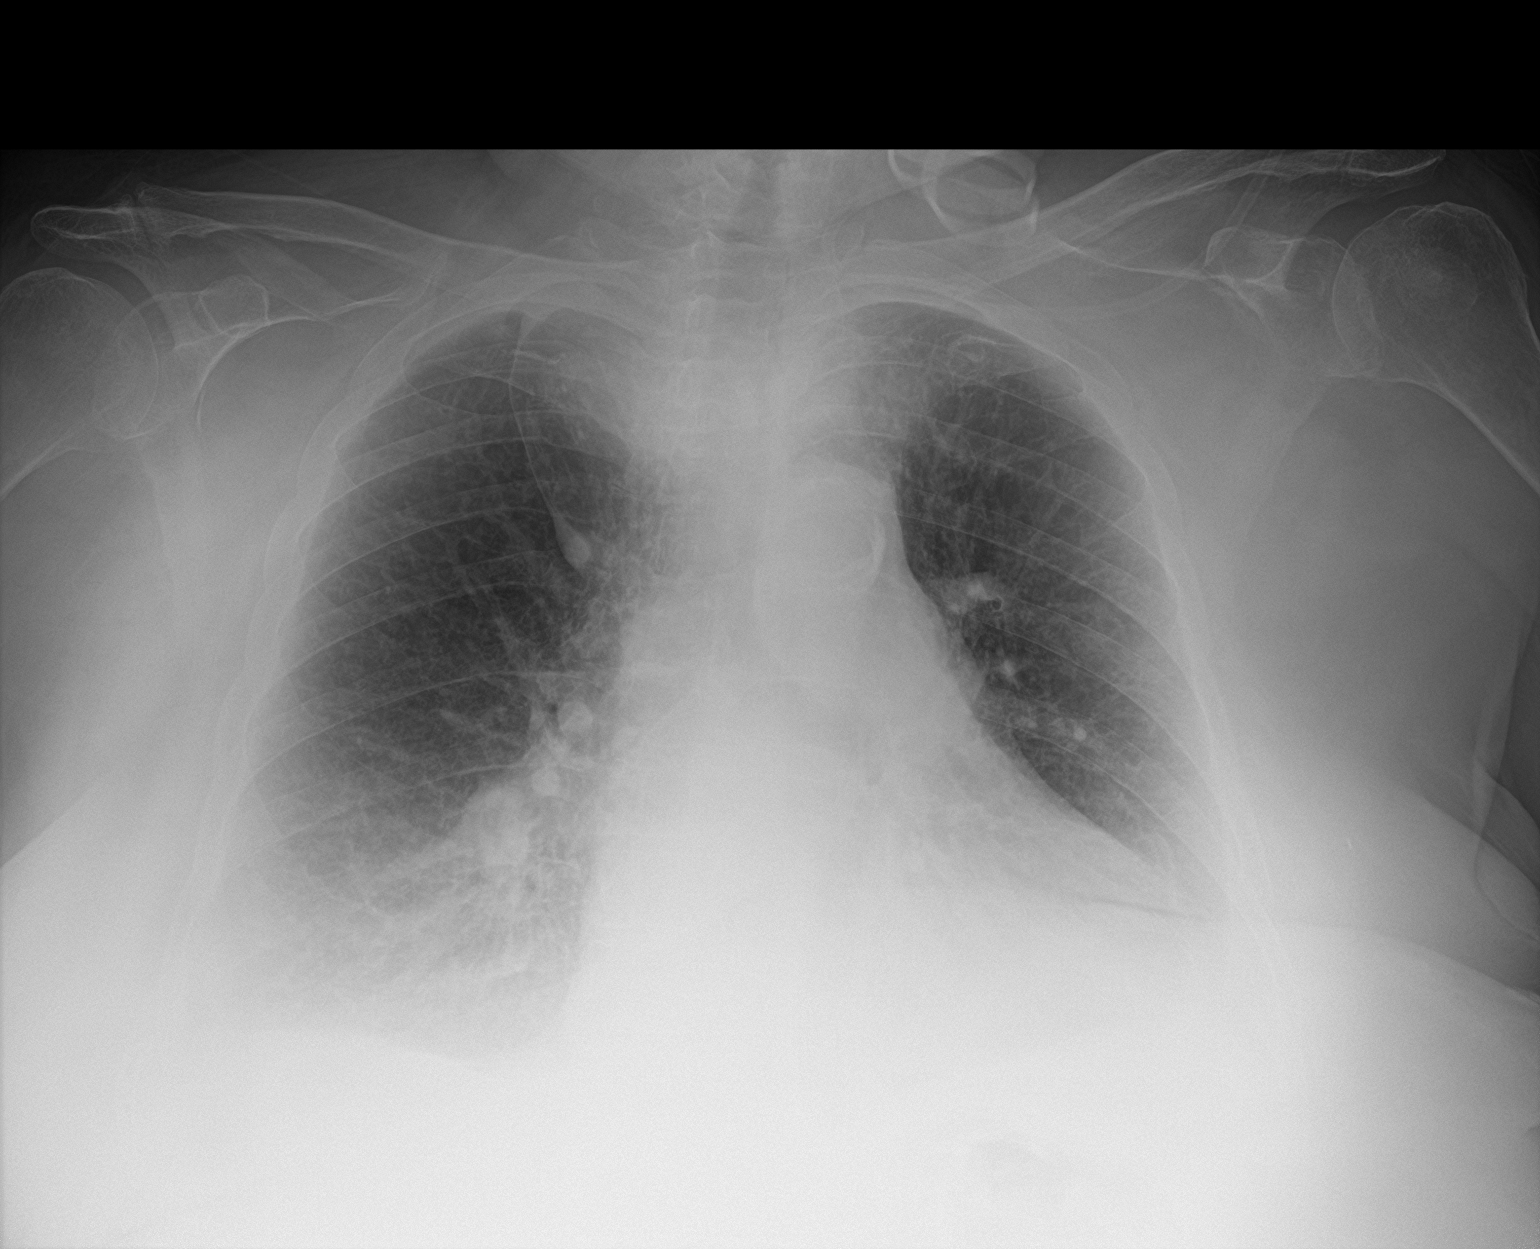

[chest lat]
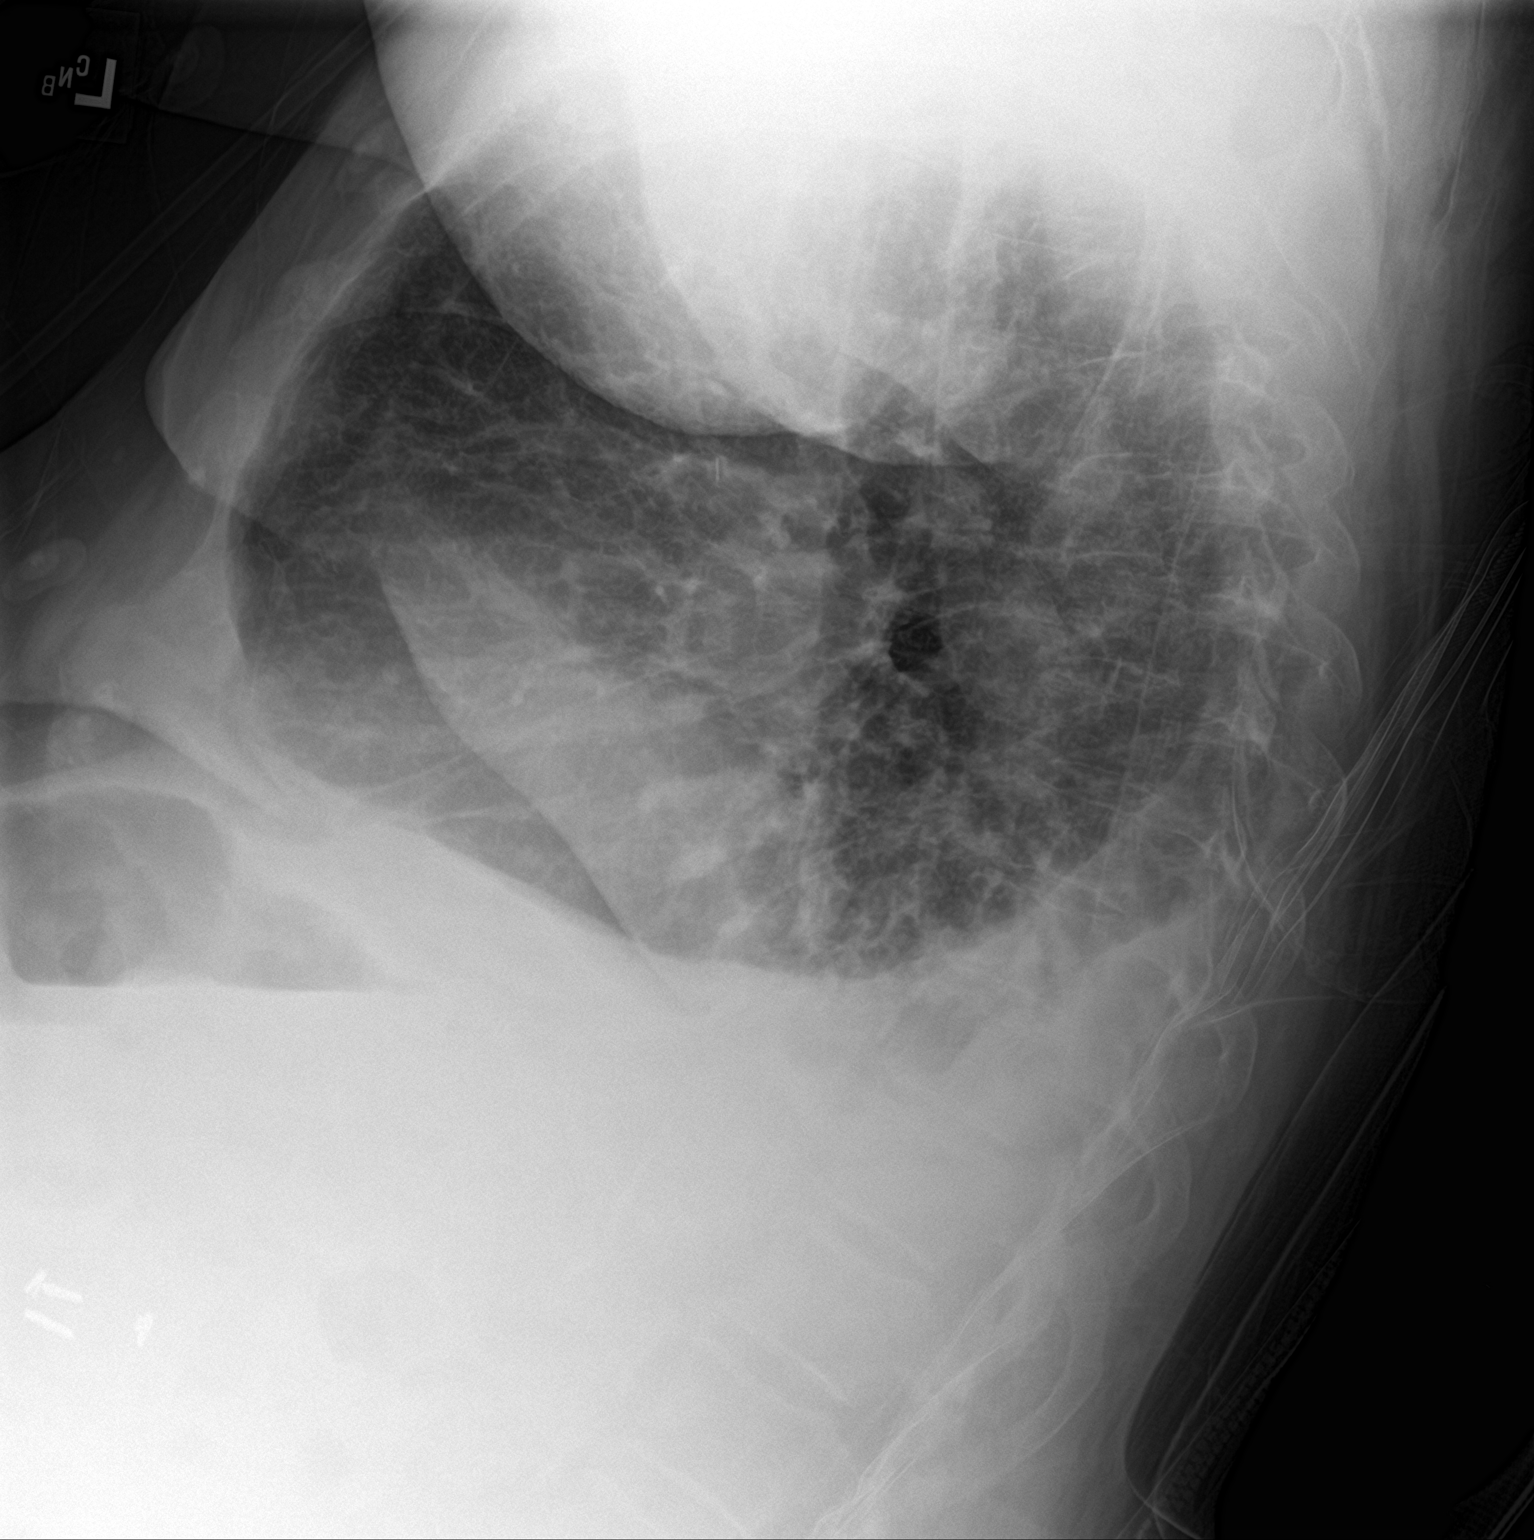

[2 of 2 positions shown; findings below may reference images not displayed]

FINDINGS: Enlargement of cardiac silhouette.

Atherosclerotic calcification aorta.

Azygos fissure noted.

Pulmonary vascularity normal.

Bronchitic changes with hazy infiltrates in the perihilar to basilar
regions bilaterally favor pulmonary edema over infection.

Bibasilar pleural effusions.

Upper lungs clear.

No pneumothorax.

Bones demineralized.
IMPRESSION: Mild BILATERAL mid to basilar infiltrates question pulmonary edema
less likely infection.

Bibasilar pleural effusions.

Aortic Atherosclerosis ([id number]).

## 2017-11-11 IMAGING — DX DG CHEST 2V
2 series · 2 of 2 positions shown · non-contrast
Comparison: 05/13/2017

CLINICAL DATA: CHF, shortness of Breath

EXAM:
CHEST  2 VIEW

[chest lat]
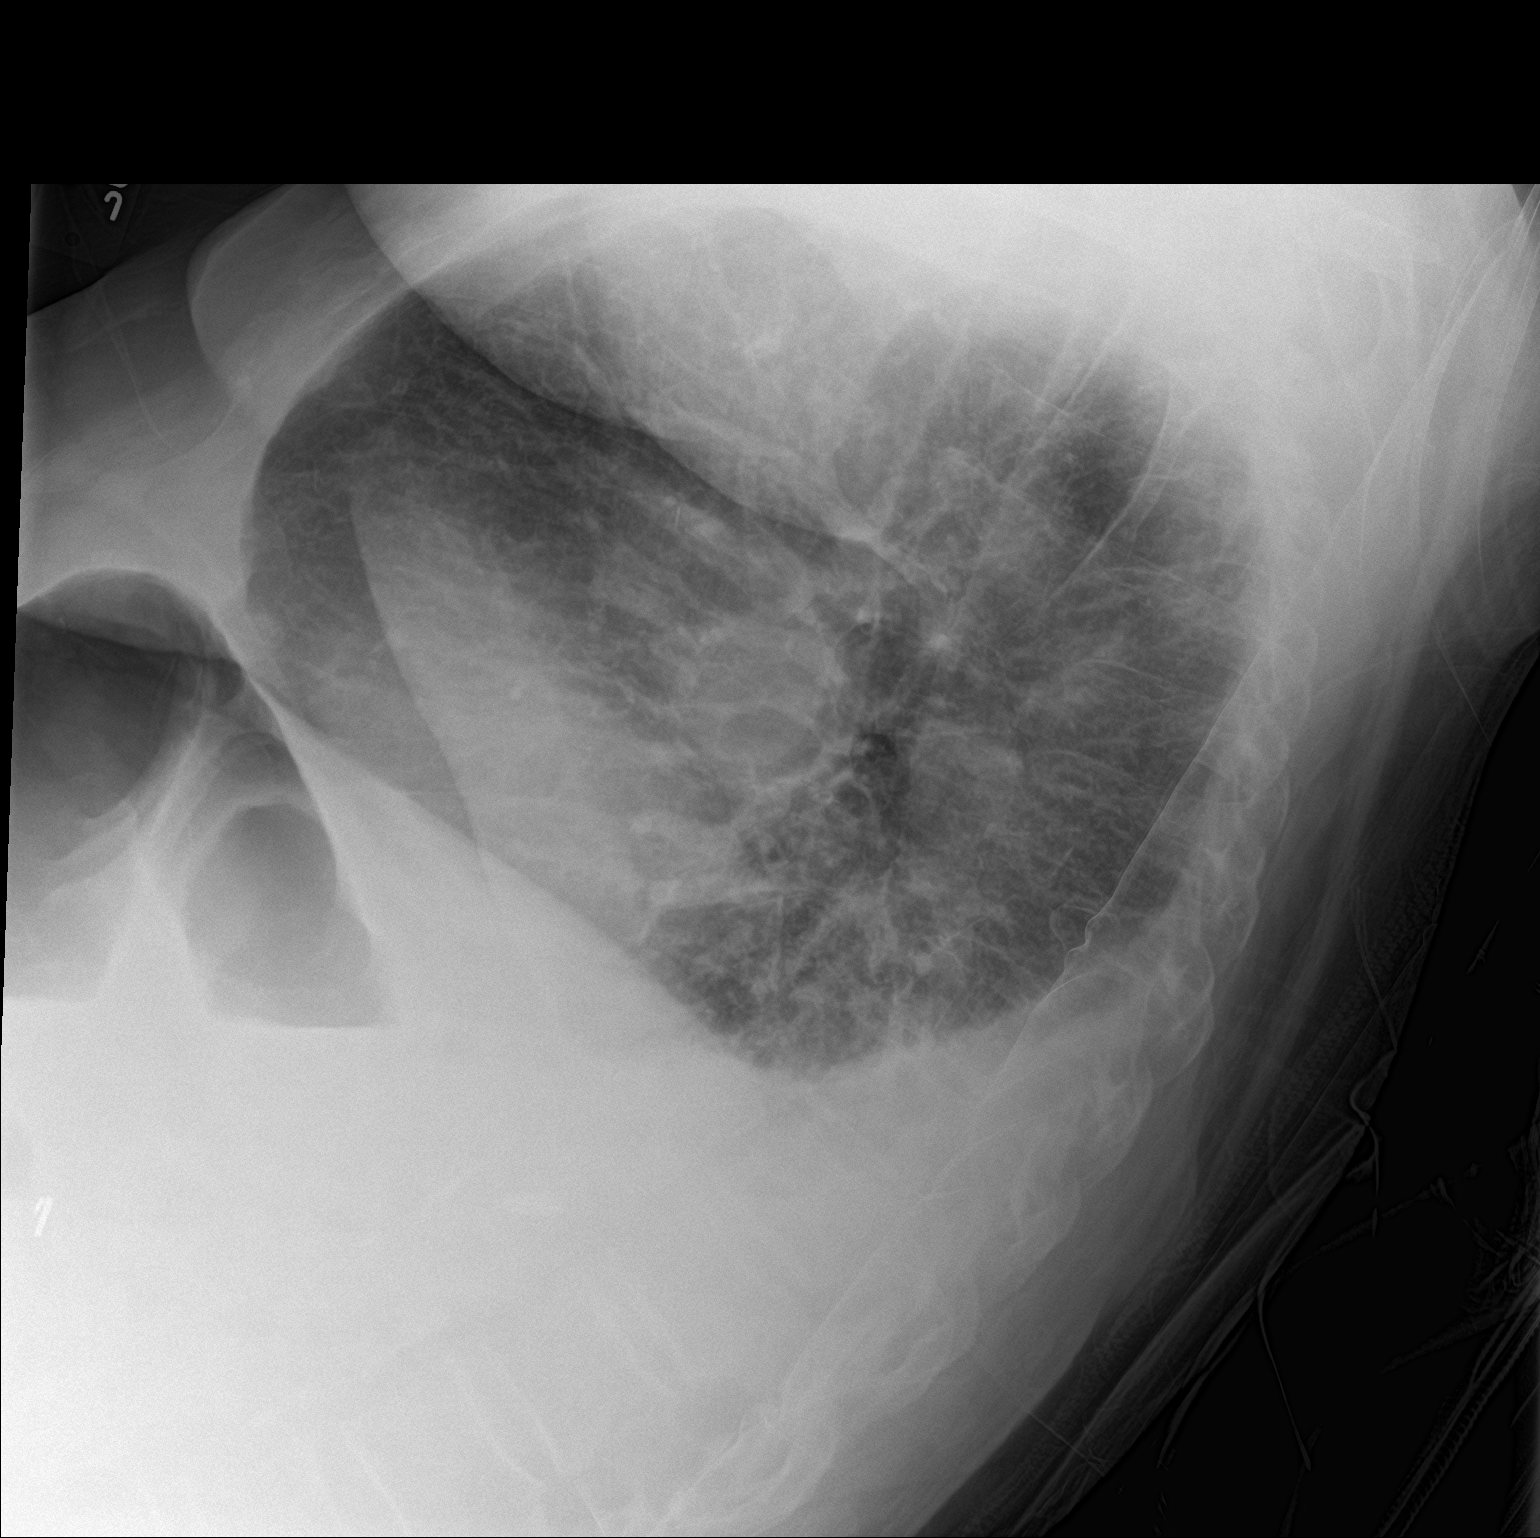

[chest ap]
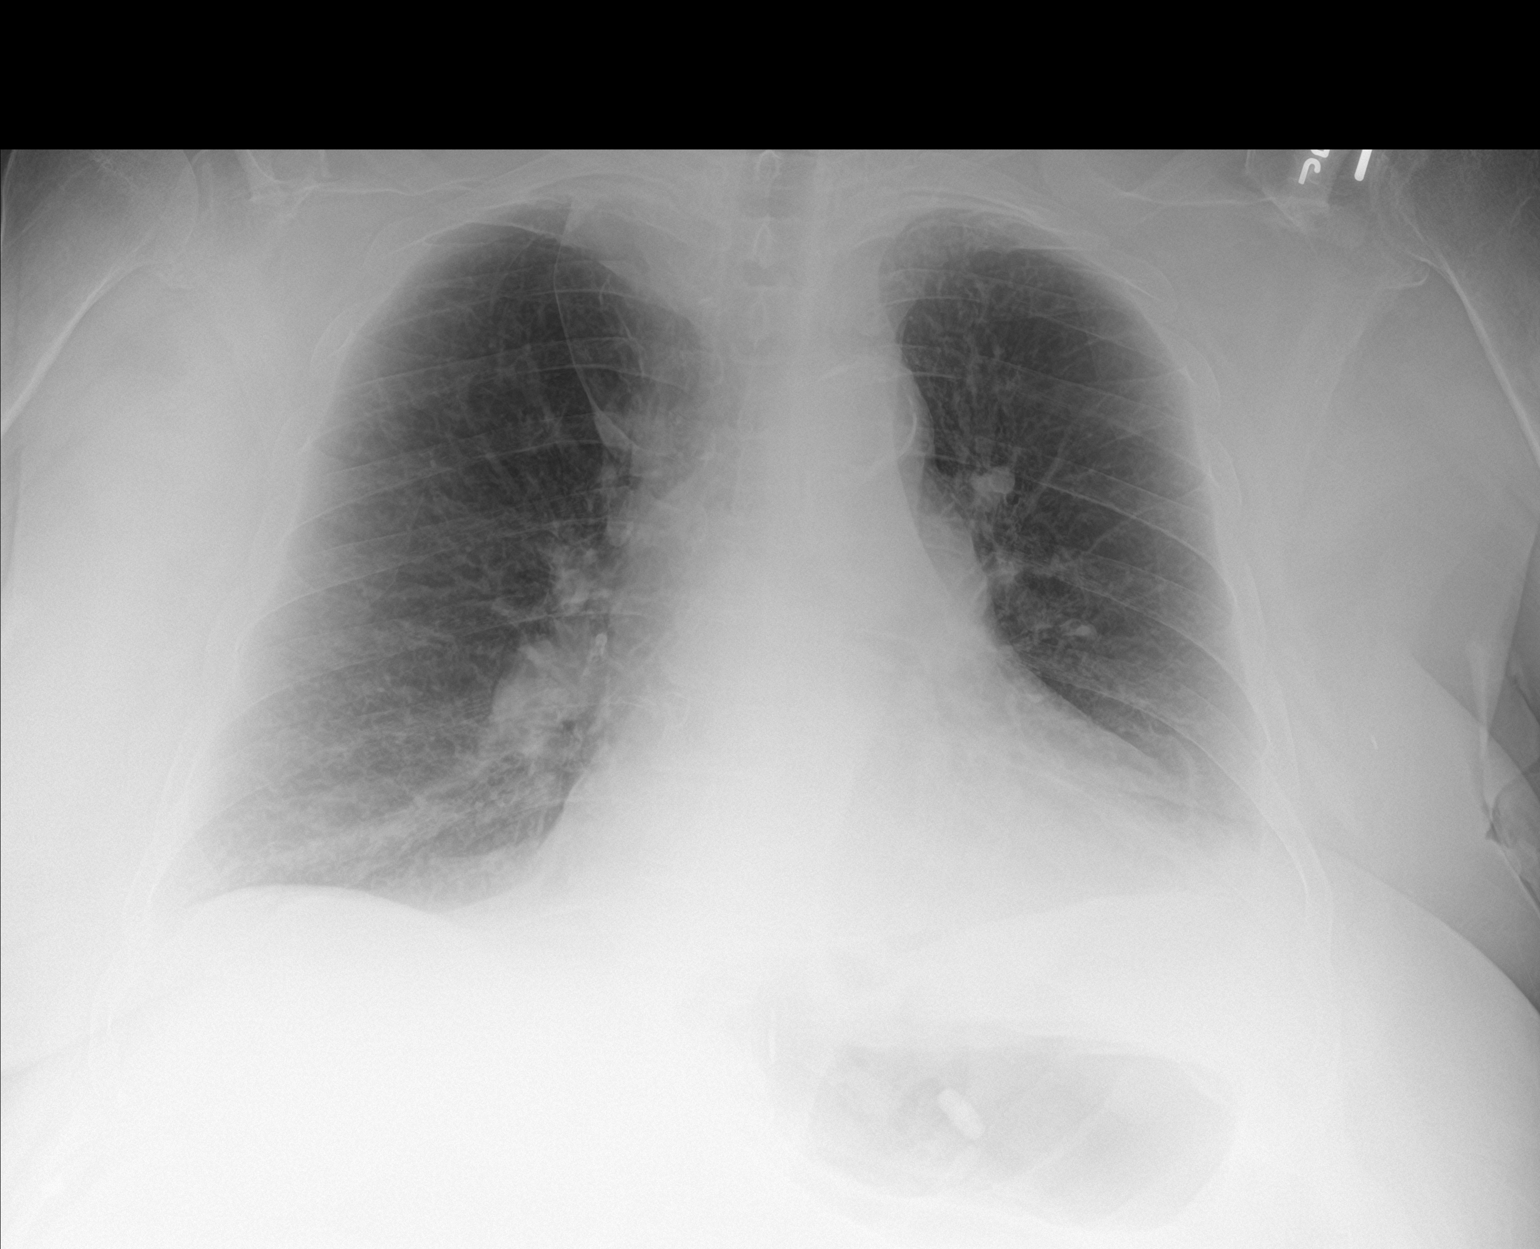

[2 of 2 positions shown; findings below may reference images not displayed]

FINDINGS: Improving bibasilar opacities, likely improving edema. Small
bilateral effusions. Heart is mildly enlarged.
IMPRESSION: Improving pulmonary edema pattern.  Residual small effusions.
